# Patient Record
Sex: Female | Born: 1950
Health system: Southern US, Community
[De-identification: ages and names within clinical notes are randomized; demographics above are authoritative.]

## PROBLEM LIST (undated history)

## (undated) DIAGNOSIS — M1990 Unspecified osteoarthritis, unspecified site: Secondary | ICD-10-CM

## (undated) DIAGNOSIS — E669 Obesity, unspecified: Secondary | ICD-10-CM

## (undated) DIAGNOSIS — E079 Disorder of thyroid, unspecified: Secondary | ICD-10-CM

## (undated) DIAGNOSIS — F32A Depression, unspecified: Secondary | ICD-10-CM

## (undated) DIAGNOSIS — M545 Low back pain, unspecified: Secondary | ICD-10-CM

## (undated) DIAGNOSIS — E785 Hyperlipidemia, unspecified: Secondary | ICD-10-CM

## (undated) DIAGNOSIS — I10 Essential (primary) hypertension: Secondary | ICD-10-CM

## (undated) DIAGNOSIS — M5410 Radiculopathy, site unspecified: Secondary | ICD-10-CM

## (undated) DIAGNOSIS — E039 Hypothyroidism, unspecified: Secondary | ICD-10-CM

## (undated) DIAGNOSIS — F329 Major depressive disorder, single episode, unspecified: Secondary | ICD-10-CM

## (undated) DIAGNOSIS — E119 Type 2 diabetes mellitus without complications: Secondary | ICD-10-CM

## (undated) DIAGNOSIS — E876 Hypokalemia: Secondary | ICD-10-CM

## (undated) HISTORY — PX: TUBAL LIGATION: SHX77

## (undated) HISTORY — DX: Essential (primary) hypertension: I10

## (undated) HISTORY — DX: Major depressive disorder, single episode, unspecified: F32.9

## (undated) HISTORY — PX: THYROID LOBECTOMY: SHX420

## (undated) HISTORY — DX: Hyperlipidemia, unspecified: E78.5

## (undated) HISTORY — DX: Hypokalemia: E87.6

## (undated) HISTORY — DX: Low back pain, unspecified: M54.50

## (undated) HISTORY — DX: Low back pain: M54.5

## (undated) HISTORY — DX: Radiculopathy, site unspecified: M54.10

## (undated) HISTORY — DX: Depression, unspecified: F32.A

## (undated) HISTORY — DX: Disorder of thyroid, unspecified: E07.9

## (undated) HISTORY — DX: Obesity, unspecified: E66.9

---

## 1997-10-15 ENCOUNTER — Encounter: Admission: RE | Admit: 1997-10-15 | Discharge: 1997-10-15 | Payer: Self-pay | Admitting: Family Medicine

## 1997-11-02 ENCOUNTER — Encounter: Admission: RE | Admit: 1997-11-02 | Discharge: 1997-11-02 | Payer: Self-pay | Admitting: Family Medicine

## 1997-11-08 ENCOUNTER — Encounter: Admission: RE | Admit: 1997-11-08 | Discharge: 1997-11-08 | Payer: Self-pay | Admitting: Family Medicine

## 1998-10-08 ENCOUNTER — Encounter: Admission: RE | Admit: 1998-10-08 | Discharge: 1998-10-08 | Payer: Self-pay | Admitting: Sports Medicine

## 1998-11-07 ENCOUNTER — Encounter: Admission: RE | Admit: 1998-11-07 | Discharge: 1998-11-07 | Payer: Self-pay | Admitting: Family Medicine

## 1998-11-21 ENCOUNTER — Encounter: Admission: RE | Admit: 1998-11-21 | Discharge: 1998-11-21 | Payer: Self-pay | Admitting: Family Medicine

## 1999-03-05 ENCOUNTER — Encounter: Admission: RE | Admit: 1999-03-05 | Discharge: 1999-03-05 | Payer: Self-pay | Admitting: Family Medicine

## 1999-09-04 ENCOUNTER — Ambulatory Visit (HOSPITAL_BASED_OUTPATIENT_CLINIC_OR_DEPARTMENT_OTHER): Admission: RE | Admit: 1999-09-04 | Discharge: 1999-09-04 | Payer: Self-pay | Admitting: Orthopedic Surgery

## 2000-03-02 ENCOUNTER — Encounter: Admission: RE | Admit: 2000-03-02 | Discharge: 2000-03-02 | Payer: Self-pay | Admitting: Family Medicine

## 2000-03-09 ENCOUNTER — Encounter: Admission: RE | Admit: 2000-03-09 | Discharge: 2000-03-09 | Payer: Self-pay | Admitting: *Deleted

## 2000-03-09 ENCOUNTER — Encounter: Payer: Self-pay | Admitting: *Deleted

## 2000-06-15 HISTORY — PX: SHOULDER ARTHROSCOPY: SHX128

## 2001-04-04 ENCOUNTER — Encounter: Admission: RE | Admit: 2001-04-04 | Discharge: 2001-04-04 | Payer: Self-pay | Admitting: Family Medicine

## 2001-04-05 ENCOUNTER — Encounter: Payer: Self-pay | Admitting: *Deleted

## 2001-04-05 ENCOUNTER — Encounter: Admission: RE | Admit: 2001-04-05 | Discharge: 2001-04-05 | Payer: Self-pay | Admitting: *Deleted

## 2001-04-11 ENCOUNTER — Encounter: Admission: RE | Admit: 2001-04-11 | Discharge: 2001-04-11 | Payer: Self-pay | Admitting: Family Medicine

## 2001-07-20 ENCOUNTER — Emergency Department (HOSPITAL_COMMUNITY): Admission: EM | Admit: 2001-07-20 | Discharge: 2001-07-20 | Payer: Self-pay

## 2001-11-18 ENCOUNTER — Encounter: Admission: RE | Admit: 2001-11-18 | Discharge: 2001-11-18 | Payer: Self-pay | Admitting: Family Medicine

## 2001-12-02 ENCOUNTER — Encounter: Admission: RE | Admit: 2001-12-02 | Discharge: 2001-12-02 | Payer: Self-pay | Admitting: Family Medicine

## 2001-12-12 ENCOUNTER — Ambulatory Visit (HOSPITAL_COMMUNITY): Admission: RE | Admit: 2001-12-12 | Discharge: 2001-12-12 | Payer: Self-pay | Admitting: *Deleted

## 2001-12-28 ENCOUNTER — Encounter: Admission: RE | Admit: 2001-12-28 | Discharge: 2001-12-28 | Payer: Self-pay | Admitting: Family Medicine

## 2002-01-09 ENCOUNTER — Encounter: Admission: RE | Admit: 2002-01-09 | Discharge: 2002-01-09 | Payer: Self-pay | Admitting: Podiatry

## 2002-01-24 ENCOUNTER — Encounter: Admission: RE | Admit: 2002-01-24 | Discharge: 2002-01-24 | Payer: Self-pay | Admitting: Family Medicine

## 2002-02-06 ENCOUNTER — Encounter: Admission: RE | Admit: 2002-02-06 | Discharge: 2002-02-06 | Payer: Self-pay | Admitting: Family Medicine

## 2002-02-20 ENCOUNTER — Encounter: Admission: RE | Admit: 2002-02-20 | Discharge: 2002-02-20 | Payer: Self-pay | Admitting: Family Medicine

## 2002-07-18 ENCOUNTER — Encounter: Admission: RE | Admit: 2002-07-18 | Discharge: 2002-07-18 | Payer: Self-pay | Admitting: Family Medicine

## 2002-08-31 ENCOUNTER — Encounter: Admission: RE | Admit: 2002-08-31 | Discharge: 2002-08-31 | Payer: Self-pay | Admitting: Sports Medicine

## 2002-10-04 ENCOUNTER — Encounter: Admission: RE | Admit: 2002-10-04 | Discharge: 2002-10-04 | Payer: Self-pay | Admitting: Family Medicine

## 2002-10-09 ENCOUNTER — Encounter: Payer: Self-pay | Admitting: Sports Medicine

## 2002-10-09 ENCOUNTER — Encounter: Admission: RE | Admit: 2002-10-09 | Discharge: 2002-10-09 | Payer: Self-pay | Admitting: Sports Medicine

## 2002-10-13 ENCOUNTER — Encounter: Admission: RE | Admit: 2002-10-13 | Discharge: 2002-10-13 | Payer: Self-pay | Admitting: Family Medicine

## 2002-11-10 ENCOUNTER — Encounter: Admission: RE | Admit: 2002-11-10 | Discharge: 2002-11-10 | Payer: Self-pay | Admitting: Sports Medicine

## 2002-11-28 ENCOUNTER — Encounter: Admission: RE | Admit: 2002-11-28 | Discharge: 2002-11-28 | Payer: Self-pay | Admitting: Sports Medicine

## 2002-12-04 ENCOUNTER — Encounter (INDEPENDENT_AMBULATORY_CARE_PROVIDER_SITE_OTHER): Payer: Self-pay | Admitting: Specialist

## 2002-12-04 ENCOUNTER — Observation Stay (HOSPITAL_COMMUNITY): Admission: RE | Admit: 2002-12-04 | Discharge: 2002-12-05 | Payer: Self-pay | Admitting: General Surgery

## 2003-07-17 ENCOUNTER — Encounter (INDEPENDENT_AMBULATORY_CARE_PROVIDER_SITE_OTHER): Payer: Self-pay | Admitting: *Deleted

## 2003-07-31 ENCOUNTER — Encounter: Admission: RE | Admit: 2003-07-31 | Discharge: 2003-07-31 | Payer: Self-pay | Admitting: Family Medicine

## 2003-08-09 ENCOUNTER — Encounter: Admission: RE | Admit: 2003-08-09 | Discharge: 2003-08-09 | Payer: Self-pay | Admitting: Sports Medicine

## 2003-08-23 ENCOUNTER — Encounter: Admission: RE | Admit: 2003-08-23 | Discharge: 2003-08-23 | Payer: Self-pay | Admitting: Sports Medicine

## 2003-09-13 ENCOUNTER — Encounter: Admission: RE | Admit: 2003-09-13 | Discharge: 2003-09-13 | Payer: Self-pay | Admitting: Sports Medicine

## 2003-09-26 ENCOUNTER — Encounter: Admission: RE | Admit: 2003-09-26 | Discharge: 2003-09-26 | Payer: Self-pay | Admitting: Family Medicine

## 2003-10-26 ENCOUNTER — Encounter: Admission: RE | Admit: 2003-10-26 | Discharge: 2003-10-26 | Payer: Self-pay | Admitting: Family Medicine

## 2003-12-03 ENCOUNTER — Encounter: Admission: RE | Admit: 2003-12-03 | Discharge: 2003-12-03 | Payer: Self-pay | Admitting: Family Medicine

## 2004-07-21 ENCOUNTER — Ambulatory Visit: Payer: Self-pay | Admitting: Family Medicine

## 2004-07-28 ENCOUNTER — Ambulatory Visit: Payer: Self-pay | Admitting: Sports Medicine

## 2004-08-19 ENCOUNTER — Ambulatory Visit: Payer: Self-pay | Admitting: Family Medicine

## 2004-09-05 ENCOUNTER — Encounter: Admission: RE | Admit: 2004-09-05 | Discharge: 2004-09-05 | Payer: Self-pay | Admitting: Sports Medicine

## 2005-01-08 ENCOUNTER — Ambulatory Visit: Payer: Self-pay | Admitting: Family Medicine

## 2005-05-21 ENCOUNTER — Ambulatory Visit: Payer: Self-pay | Admitting: Family Medicine

## 2005-10-20 ENCOUNTER — Encounter: Admission: RE | Admit: 2005-10-20 | Discharge: 2005-10-20 | Payer: Self-pay | Admitting: Family Medicine

## 2005-12-31 ENCOUNTER — Ambulatory Visit: Payer: Self-pay | Admitting: Family Medicine

## 2005-12-31 ENCOUNTER — Encounter (INDEPENDENT_AMBULATORY_CARE_PROVIDER_SITE_OTHER): Payer: Self-pay | Admitting: *Deleted

## 2006-01-13 ENCOUNTER — Ambulatory Visit: Payer: Self-pay | Admitting: Family Medicine

## 2006-03-30 ENCOUNTER — Ambulatory Visit: Payer: Self-pay | Admitting: Family Medicine

## 2006-04-14 ENCOUNTER — Encounter: Admission: RE | Admit: 2006-04-14 | Discharge: 2006-04-14 | Payer: Self-pay | Admitting: Family Medicine

## 2006-06-30 ENCOUNTER — Ambulatory Visit: Payer: Self-pay | Admitting: Obstetrics & Gynecology

## 2006-06-30 ENCOUNTER — Other Ambulatory Visit: Admission: RE | Admit: 2006-06-30 | Discharge: 2006-06-30 | Payer: Self-pay | Admitting: Obstetrics and Gynecology

## 2006-07-01 ENCOUNTER — Encounter (INDEPENDENT_AMBULATORY_CARE_PROVIDER_SITE_OTHER): Payer: Self-pay | Admitting: *Deleted

## 2006-07-15 ENCOUNTER — Ambulatory Visit: Payer: Self-pay | Admitting: Obstetrics & Gynecology

## 2006-08-12 DIAGNOSIS — E669 Obesity, unspecified: Secondary | ICD-10-CM | POA: Insufficient documentation

## 2006-08-12 DIAGNOSIS — K5730 Diverticulosis of large intestine without perforation or abscess without bleeding: Secondary | ICD-10-CM | POA: Insufficient documentation

## 2006-08-12 DIAGNOSIS — F339 Major depressive disorder, recurrent, unspecified: Secondary | ICD-10-CM | POA: Insufficient documentation

## 2006-08-12 DIAGNOSIS — E876 Hypokalemia: Secondary | ICD-10-CM | POA: Insufficient documentation

## 2006-08-12 DIAGNOSIS — I10 Essential (primary) hypertension: Secondary | ICD-10-CM | POA: Insufficient documentation

## 2006-08-12 DIAGNOSIS — E039 Hypothyroidism, unspecified: Secondary | ICD-10-CM | POA: Insufficient documentation

## 2006-08-13 ENCOUNTER — Encounter (INDEPENDENT_AMBULATORY_CARE_PROVIDER_SITE_OTHER): Payer: Self-pay | Admitting: *Deleted

## 2006-09-08 ENCOUNTER — Telehealth (INDEPENDENT_AMBULATORY_CARE_PROVIDER_SITE_OTHER): Payer: Self-pay | Admitting: *Deleted

## 2006-12-09 ENCOUNTER — Ambulatory Visit: Payer: Self-pay | Admitting: Family Medicine

## 2006-12-09 ENCOUNTER — Encounter (INDEPENDENT_AMBULATORY_CARE_PROVIDER_SITE_OTHER): Payer: Self-pay | Admitting: Family Medicine

## 2006-12-09 LAB — CONVERTED CEMR LAB
Cholesterol, target level: 200 mg/dL
Free T4: 1.1 ng/dL (ref 0.89–1.80)
HDL goal, serum: 40 mg/dL
LDL Goal: 130 mg/dL

## 2006-12-10 ENCOUNTER — Encounter (INDEPENDENT_AMBULATORY_CARE_PROVIDER_SITE_OTHER): Payer: Self-pay | Admitting: Family Medicine

## 2007-01-11 ENCOUNTER — Ambulatory Visit: Payer: Self-pay | Admitting: Family Medicine

## 2007-01-11 ENCOUNTER — Encounter (INDEPENDENT_AMBULATORY_CARE_PROVIDER_SITE_OTHER): Payer: Self-pay | Admitting: Family Medicine

## 2007-01-11 ENCOUNTER — Encounter: Payer: Self-pay | Admitting: Family Medicine

## 2007-01-11 LAB — CONVERTED CEMR LAB
BUN: 19 mg/dL (ref 6–23)
CO2: 30 meq/L (ref 19–32)
Calcium: 9.3 mg/dL (ref 8.4–10.5)
Chloride: 100 meq/L (ref 96–112)
Creatinine, Ser: 0.92 mg/dL (ref 0.40–1.20)

## 2007-01-14 ENCOUNTER — Encounter (INDEPENDENT_AMBULATORY_CARE_PROVIDER_SITE_OTHER): Payer: Self-pay | Admitting: Family Medicine

## 2007-08-30 ENCOUNTER — Telehealth: Payer: Self-pay | Admitting: *Deleted

## 2007-08-31 ENCOUNTER — Encounter (INDEPENDENT_AMBULATORY_CARE_PROVIDER_SITE_OTHER): Payer: Self-pay | Admitting: Family Medicine

## 2007-08-31 ENCOUNTER — Ambulatory Visit: Payer: Self-pay | Admitting: Family Medicine

## 2007-09-14 ENCOUNTER — Ambulatory Visit: Payer: Self-pay | Admitting: Family Medicine

## 2007-09-15 ENCOUNTER — Encounter: Admission: RE | Admit: 2007-09-15 | Discharge: 2007-09-15 | Payer: Self-pay | Admitting: Family Medicine

## 2007-12-22 ENCOUNTER — Telehealth: Payer: Self-pay | Admitting: *Deleted

## 2008-01-11 ENCOUNTER — Ambulatory Visit: Payer: Self-pay | Admitting: Family Medicine

## 2008-01-11 ENCOUNTER — Encounter: Payer: Self-pay | Admitting: Family Medicine

## 2008-01-12 LAB — CONVERTED CEMR LAB
BUN: 24 mg/dL — ABNORMAL HIGH (ref 6–23)
Cholesterol: 163 mg/dL (ref 0–200)
Creatinine, Ser: 0.82 mg/dL (ref 0.40–1.20)
Potassium: 4.1 meq/L (ref 3.5–5.3)
Triglycerides: 153 mg/dL — ABNORMAL HIGH (ref <150)

## 2008-01-22 ENCOUNTER — Encounter: Payer: Self-pay | Admitting: Family Medicine

## 2008-01-31 ENCOUNTER — Encounter: Admission: RE | Admit: 2008-01-31 | Discharge: 2008-01-31 | Payer: Self-pay | Admitting: Family Medicine

## 2008-02-01 ENCOUNTER — Telehealth: Payer: Self-pay | Admitting: Family Medicine

## 2008-02-02 ENCOUNTER — Telehealth: Payer: Self-pay | Admitting: *Deleted

## 2008-02-06 ENCOUNTER — Ambulatory Visit: Payer: Self-pay | Admitting: Sports Medicine

## 2008-02-06 ENCOUNTER — Encounter (INDEPENDENT_AMBULATORY_CARE_PROVIDER_SITE_OTHER): Payer: Self-pay | Admitting: Family Medicine

## 2008-02-06 ENCOUNTER — Encounter: Payer: Self-pay | Admitting: Family Medicine

## 2008-02-06 LAB — CONVERTED CEMR LAB: LDL Goal: 160 mg/dL

## 2008-02-08 ENCOUNTER — Telehealth: Payer: Self-pay | Admitting: *Deleted

## 2008-02-08 LAB — CONVERTED CEMR LAB
Chlamydia, DNA Probe: POSITIVE — AB
GC Probe Amp, Genital: NEGATIVE

## 2008-04-26 ENCOUNTER — Telehealth: Payer: Self-pay | Admitting: *Deleted

## 2008-06-27 ENCOUNTER — Telehealth: Payer: Self-pay | Admitting: *Deleted

## 2008-07-27 ENCOUNTER — Ambulatory Visit: Payer: Self-pay | Admitting: Family Medicine

## 2008-07-27 ENCOUNTER — Encounter: Payer: Self-pay | Admitting: Family Medicine

## 2008-07-27 LAB — CONVERTED CEMR LAB
HCT: 36 % (ref 36.0–46.0)
MCHC: 34.4 g/dL (ref 30.0–36.0)
MCV: 88.5 fL (ref 78.0–100.0)
Platelets: 219 10*3/uL (ref 150–400)
RDW: 12.8 % (ref 11.5–15.5)

## 2008-07-28 ENCOUNTER — Encounter: Payer: Self-pay | Admitting: Family Medicine

## 2008-08-16 ENCOUNTER — Telehealth: Payer: Self-pay | Admitting: *Deleted

## 2008-08-22 ENCOUNTER — Ambulatory Visit: Payer: Self-pay | Admitting: Family Medicine

## 2008-08-22 ENCOUNTER — Encounter: Admission: RE | Admit: 2008-08-22 | Discharge: 2008-08-22 | Payer: Self-pay | Admitting: Family Medicine

## 2008-08-22 ENCOUNTER — Telehealth (INDEPENDENT_AMBULATORY_CARE_PROVIDER_SITE_OTHER): Payer: Self-pay | Admitting: *Deleted

## 2008-08-22 ENCOUNTER — Encounter: Payer: Self-pay | Admitting: Family Medicine

## 2008-08-22 LAB — CONVERTED CEMR LAB
Calcium: 9.2 mg/dL (ref 8.4–10.5)
Creatinine, Ser: 0.86 mg/dL (ref 0.40–1.20)
Sodium: 141 meq/L (ref 135–145)

## 2008-08-23 ENCOUNTER — Encounter: Payer: Self-pay | Admitting: *Deleted

## 2008-08-24 ENCOUNTER — Encounter: Admission: RE | Admit: 2008-08-24 | Discharge: 2008-08-24 | Payer: Self-pay | Admitting: Family Medicine

## 2008-09-26 ENCOUNTER — Ambulatory Visit: Payer: Self-pay | Admitting: Family Medicine

## 2008-09-26 LAB — CONVERTED CEMR LAB: Hgb A1c MFr Bld: 6.6 %

## 2008-09-28 ENCOUNTER — Encounter: Admission: RE | Admit: 2008-09-28 | Discharge: 2008-09-28 | Payer: Self-pay | Admitting: Family Medicine

## 2008-09-28 ENCOUNTER — Telehealth: Payer: Self-pay | Admitting: Family Medicine

## 2008-10-29 ENCOUNTER — Ambulatory Visit: Payer: Self-pay | Admitting: Family Medicine

## 2008-10-29 DIAGNOSIS — E119 Type 2 diabetes mellitus without complications: Secondary | ICD-10-CM | POA: Insufficient documentation

## 2008-10-29 DIAGNOSIS — M1610 Unilateral primary osteoarthritis, unspecified hip: Secondary | ICD-10-CM | POA: Insufficient documentation

## 2008-10-29 DIAGNOSIS — M47819 Spondylosis without myelopathy or radiculopathy, site unspecified: Secondary | ICD-10-CM | POA: Insufficient documentation

## 2008-10-29 DIAGNOSIS — M169 Osteoarthritis of hip, unspecified: Secondary | ICD-10-CM

## 2008-10-30 ENCOUNTER — Telehealth: Payer: Self-pay | Admitting: Family Medicine

## 2008-11-20 ENCOUNTER — Telehealth: Payer: Self-pay | Admitting: Family Medicine

## 2009-01-31 ENCOUNTER — Encounter: Admission: RE | Admit: 2009-01-31 | Discharge: 2009-01-31 | Payer: Self-pay | Admitting: Family Medicine

## 2009-02-15 ENCOUNTER — Telehealth: Payer: Self-pay | Admitting: Family Medicine

## 2009-02-15 ENCOUNTER — Encounter: Payer: Self-pay | Admitting: Family Medicine

## 2009-02-15 ENCOUNTER — Encounter: Admission: RE | Admit: 2009-02-15 | Discharge: 2009-02-15 | Payer: Self-pay | Admitting: Family Medicine

## 2009-04-15 ENCOUNTER — Ambulatory Visit: Payer: Self-pay | Admitting: Family Medicine

## 2009-04-15 ENCOUNTER — Encounter: Payer: Self-pay | Admitting: Family Medicine

## 2009-04-15 LAB — CONVERTED CEMR LAB
Calcium: 8.7 mg/dL (ref 8.4–10.5)
Glucose, Bld: 90 mg/dL (ref 70–99)
Hgb A1c MFr Bld: 6.3 %
Potassium: 3.6 meq/L (ref 3.5–5.3)
Sodium: 142 meq/L (ref 135–145)

## 2009-04-16 ENCOUNTER — Ambulatory Visit: Payer: Self-pay | Admitting: Cardiology

## 2009-04-16 ENCOUNTER — Encounter: Payer: Self-pay | Admitting: Family Medicine

## 2009-04-16 DIAGNOSIS — E785 Hyperlipidemia, unspecified: Secondary | ICD-10-CM | POA: Insufficient documentation

## 2009-04-29 ENCOUNTER — Telehealth (INDEPENDENT_AMBULATORY_CARE_PROVIDER_SITE_OTHER): Payer: Self-pay

## 2009-04-30 ENCOUNTER — Ambulatory Visit (HOSPITAL_COMMUNITY): Admission: RE | Admit: 2009-04-30 | Discharge: 2009-04-30 | Payer: Self-pay | Admitting: Cardiology

## 2009-04-30 ENCOUNTER — Ambulatory Visit: Payer: Self-pay

## 2009-04-30 ENCOUNTER — Encounter: Payer: Self-pay | Admitting: Cardiology

## 2009-04-30 ENCOUNTER — Ambulatory Visit: Payer: Self-pay | Admitting: Cardiology

## 2009-04-30 ENCOUNTER — Encounter (HOSPITAL_COMMUNITY): Admission: RE | Admit: 2009-04-30 | Discharge: 2009-06-12 | Payer: Self-pay | Admitting: Cardiology

## 2009-05-01 LAB — CONVERTED CEMR LAB
ALT: 17 units/L (ref 0–35)
Albumin: 3.6 g/dL (ref 3.5–5.2)
Bilirubin, Direct: 0.1 mg/dL (ref 0.0–0.3)
Cholesterol: 186 mg/dL (ref 0–200)
Total Protein: 7.5 g/dL (ref 6.0–8.3)
Triglycerides: 147 mg/dL (ref 0.0–149.0)
VLDL: 29.4 mg/dL (ref 0.0–40.0)

## 2009-05-03 ENCOUNTER — Ambulatory Visit: Payer: Self-pay | Admitting: Cardiology

## 2009-05-13 ENCOUNTER — Ambulatory Visit: Payer: Self-pay | Admitting: Cardiology

## 2009-05-14 LAB — CONVERTED CEMR LAB
BUN: 20 mg/dL (ref 6–23)
Creatinine, Ser: 1.2 mg/dL (ref 0.4–1.2)
Eosinophils Relative: 1.9 % (ref 0.0–5.0)
GFR calc non Af Amer: 59.09 mL/min (ref >60)
HCT: 35 % — ABNORMAL LOW (ref 36.0–46.0)
INR: 0.9 (ref 0.8–1.0)
Monocytes Relative: 7.9 % (ref 3.0–12.0)
Neutrophils Relative %: 42.7 % — ABNORMAL LOW (ref 43.0–77.0)
Platelets: 257 10*3/uL (ref 150.0–400.0)
Potassium: 3.2 meq/L — ABNORMAL LOW (ref 3.5–5.1)
Prothrombin Time: 9.8 s (ref 9.1–11.7)
RBC: 3.9 M/uL (ref 3.87–5.11)
WBC: 3.9 10*3/uL — ABNORMAL LOW (ref 4.5–10.5)

## 2009-05-16 ENCOUNTER — Inpatient Hospital Stay (HOSPITAL_BASED_OUTPATIENT_CLINIC_OR_DEPARTMENT_OTHER): Admission: RE | Admit: 2009-05-16 | Discharge: 2009-05-16 | Payer: Self-pay | Admitting: Cardiology

## 2009-05-16 ENCOUNTER — Ambulatory Visit: Payer: Self-pay | Admitting: Cardiology

## 2009-05-17 ENCOUNTER — Telehealth: Payer: Self-pay | Admitting: Cardiology

## 2009-05-20 ENCOUNTER — Telehealth: Payer: Self-pay | Admitting: Cardiology

## 2009-05-21 ENCOUNTER — Encounter: Payer: Self-pay | Admitting: Cardiology

## 2009-05-24 ENCOUNTER — Ambulatory Visit: Payer: Self-pay | Admitting: Cardiology

## 2009-05-31 ENCOUNTER — Ambulatory Visit: Payer: Self-pay | Admitting: Cardiology

## 2009-09-06 ENCOUNTER — Telehealth: Payer: Self-pay | Admitting: *Deleted

## 2009-09-06 ENCOUNTER — Encounter: Admission: RE | Admit: 2009-09-06 | Discharge: 2009-09-06 | Payer: Self-pay | Admitting: Family Medicine

## 2009-09-19 ENCOUNTER — Encounter: Payer: Self-pay | Admitting: Family Medicine

## 2009-09-19 ENCOUNTER — Ambulatory Visit: Payer: Self-pay | Admitting: Family Medicine

## 2009-09-19 DIAGNOSIS — R5383 Other fatigue: Secondary | ICD-10-CM | POA: Insufficient documentation

## 2009-09-19 DIAGNOSIS — R5381 Other malaise: Secondary | ICD-10-CM | POA: Insufficient documentation

## 2009-09-19 LAB — CONVERTED CEMR LAB
Potassium: 3.8 meq/L (ref 3.5–5.3)
Sodium: 143 meq/L (ref 135–145)

## 2009-09-20 ENCOUNTER — Encounter: Payer: Self-pay | Admitting: Family Medicine

## 2009-09-26 ENCOUNTER — Encounter: Payer: Self-pay | Admitting: Family Medicine

## 2009-09-26 ENCOUNTER — Ambulatory Visit: Payer: Self-pay | Admitting: Family Medicine

## 2009-09-26 DIAGNOSIS — R21 Rash and other nonspecific skin eruption: Secondary | ICD-10-CM | POA: Insufficient documentation

## 2009-09-26 DIAGNOSIS — M549 Dorsalgia, unspecified: Secondary | ICD-10-CM | POA: Insufficient documentation

## 2009-09-27 ENCOUNTER — Encounter (INDEPENDENT_AMBULATORY_CARE_PROVIDER_SITE_OTHER): Payer: Self-pay | Admitting: *Deleted

## 2009-09-27 LAB — CONVERTED CEMR LAB
HCT: 39.5 % (ref 36.0–46.0)
Hemoglobin: 12.5 g/dL (ref 12.0–15.0)
MCV: 90.4 fL (ref 78.0–100.0)
RBC: 4.37 M/uL (ref 3.87–5.11)
WBC: 3.8 10*3/uL — ABNORMAL LOW (ref 4.0–10.5)

## 2009-10-08 ENCOUNTER — Telehealth: Payer: Self-pay | Admitting: *Deleted

## 2009-10-09 ENCOUNTER — Encounter: Payer: Self-pay | Admitting: Family Medicine

## 2009-10-17 ENCOUNTER — Ambulatory Visit: Payer: Self-pay | Admitting: Family Medicine

## 2009-10-24 ENCOUNTER — Encounter: Payer: Self-pay | Admitting: *Deleted

## 2009-10-29 ENCOUNTER — Encounter: Admission: RE | Admit: 2009-10-29 | Discharge: 2009-11-12 | Payer: Self-pay | Admitting: Family Medicine

## 2009-11-12 ENCOUNTER — Encounter: Payer: Self-pay | Admitting: Family Medicine

## 2010-03-06 ENCOUNTER — Encounter: Admission: RE | Admit: 2010-03-06 | Discharge: 2010-03-06 | Payer: Self-pay | Admitting: Family Medicine

## 2010-03-07 ENCOUNTER — Encounter: Payer: Self-pay | Admitting: Family Medicine

## 2010-03-19 ENCOUNTER — Telehealth: Payer: Self-pay | Admitting: *Deleted

## 2010-06-12 ENCOUNTER — Telehealth: Payer: Self-pay | Admitting: Family Medicine

## 2010-07-01 ENCOUNTER — Ambulatory Visit: Admission: RE | Admit: 2010-07-01 | Discharge: 2010-07-01 | Payer: Self-pay | Source: Home / Self Care

## 2010-07-06 ENCOUNTER — Encounter: Payer: Self-pay | Admitting: Family Medicine

## 2010-07-07 ENCOUNTER — Encounter: Payer: Self-pay | Admitting: Family Medicine

## 2010-07-15 NOTE — Progress Notes (Signed)
Summary: refill  Phone Note Refill Request Call back at Home Phone [phone] Call back at [phone] Message from:  Patient  Refills Requested: Medication #1:  SYNTHROID 50 MCG TABS Take 1 tablet by mouth once a day [facility] - [phone]  Initial call taken by: De Nurse,  March 19, 2010 4:40 PM  Follow-up for Phone Call       Follow-up by: Golden Circle RN,  March 19, 2010 4:44 PM    Prescriptions: SYNTHROID 50 MCG TABS (LEVOTHYROXINE SODIUM) Take 1 tablet by mouth once a day  #90 x 3   Entered by:   Golden Circle RN   Authorized by:   Lloyd Huger MD   Signed by:   Golden Circle RN on 03/19/2010   Method used:   Printed then faxed to ...       [facility] (mail-order)       [address]       [place], [place]  [address]       Ph: [phone]       Fax: [phone]   RxID:   [id number] SYNTHROID 50 MCG TABS (LEVOTHYROXINE SODIUM) Take 1 tablet by mouth once a day  #90 x 3   Entered by:   Golden Circle RN   Authorized by:   Lloyd Huger MD   Signed by:   Golden Circle RN on 03/19/2010   Method used:   Historical   RxID:   [id number]

## 2010-07-15 NOTE — Letter (Signed)
Summary: Generic Letter  [facility]  [address]   [place], [place] [address]   Phone: [phone]  Fax: [phone]    09/20/2009  ALIA PARSLEY [address] [place], [place]  [address]  Dear Ms. Beaumier,    I just wanted to let you know that your lab results were normal.  I will send in your prescription for the potassium pills.  Please call me if you have any questions or concerns.          Sincerely,   Asher Muir MD  Appended Document: Generic Letter mailed.

## 2010-07-15 NOTE — Letter (Signed)
Summary: Out of Work  [facility]  [address]   [place], [place] [address]   Phone: [phone]  Fax: [phone]    September 26, 2009   Employee:  Candice Warner    To Whom It May Concern:   For Medical reasons, please excuse the above named employee from work for the following dates:  Start:   September 30, 2009  End:   October 04, 2009  If you need additional information, please feel free to contact our office.         Sincerely,    Asher Muir MD

## 2010-07-15 NOTE — Letter (Signed)
Summary: Generic Letter  [facility]  [address]   [place], [place] [address]   Phone: [phone]  Fax: [phone]    10/09/2009  Candice Warner [address] [place], [place]  [address]  To whom it may concern,  [name] may return to work on May 2nd, 2011.  Please call me if you have any questions or concerns.              Sincerely,   Asher Muir MD

## 2010-07-15 NOTE — Letter (Signed)
Summary: Generic Letter  [facility]  [address]   [place], [place] [address]   Phone: [phone]  Fax: [phone]    03/07/2010  KATIANNA MCCLENNEY [address] [place], [place]  [address]  Dear Ms. Delia,  Your recent mammagram did not show any worrisome masses. It is recommended that you continue to get mammogram screening on a yearly basis, with the next being due in September, 2012.  Sincerely,   Lloyd Huger MD  Appended Document: Generic Letter mailed.  Appended Document: Generic Letter mailed.

## 2010-07-15 NOTE — Assessment & Plan Note (Signed)
Summary: pain in back/legs/eo   Vital Signs:  Patient profile:   60 year old female Weight:      211.3 pounds Temp:     98.2 degrees F oral Pulse rate:   71 / minute Pulse rhythm:   regular BP sitting:   147 / 81  (left arm) Cuff size:   regular  Vitals Entered By: Loralee Pacas CMA (September 19, 2009 4:02 PM)  Primary Care Provider:  Asher Muir MD   History of Present Illness: 1.  sleepy--sleepy all the time.  worried she will fall asleep while driving.  works 2 jobs.  gets up at 4 and go to sleep at mn.  at this time, is not able to quit either of these jobs.    2.  back pain--lower back.  worse with lots of walking and worse after sitting a long time.  hurts to bend down.  pain radiates to left leg.  no mri.  taking ibuprofen.  no weakness.  no b/ b incontinence and no saddle anesthia.  never tried physical therapy  3.  diabetes--due for A1C.  diet controlled thus far.  no symptoms  4.  chest pain--seen by cards.  stress suggesteive of ischemia, but but cath normal; so likely the abnromal stress test was due to breast shadow.    5.  hypokalemia--placed on KCl by cards.  has not had a bmet in several months  Allergies: No Known Drug Allergies  Review of Systems  The patient denies anorexia, weight loss, dyspnea on exertion, and prolonged cough.    Physical Exam  General:  Well-groomed, overweight, appears tired, but NAD Additional Exam:  vital signs reviewed    Detailed Back/Spine Exam  Gait:    normal  Palpation:    mild ttp over paraspinous muscles.  no ttp over si joint or sciatic notch  Lumbosacral Exam:  Range of Motion:    Forward Flexion:   90 degrees    Hyperextension:   20 degrees    Right Lateral Bend:   25 degrees    Left Lateral Bend:   25 degrees Sitting Straight Leg Raise:    Right:  negative    Left:  negative Sciatic Notch:    There is no sciatic notch tenderness. Toe Walking:    Right:  normal    Left:  normal Heel Walking:  Right:  normal    Left:  normal Fabere Test:    Right:  negative    Left:  negative     normal strength in major muscle groups fo lower extremities   Impression & Recommendations:  Problem # 1:  CHEST PAIN (ICD-786.50) Assessment Improved  no CAD  Orders: [facility]- Est  Level 4 (89381)  Problem # 2:  DIABETES MELLITUS, TYPE II, WITHOUT COMPLICATIONS (ICD-250.00) check A1C today Her updated medication list for this problem includes:    Lisinopril-hydrochlorothiazide 20-25 Mg Tabs (Lisinopril-hydrochlorothiazide) ..... One tablet daily    Aspirin 81 Mg Tbec (Aspirin) ..... One tablet daily  Orders: A1C-[facility] (01751) [facility]- Est  Level 4 (02585)  Problem # 3:  HYPOKALEMIA (ICD-276.8) Assessment: Unchanged  check bmet today  Orders: Basic Met-[facility] (0011001100) [facility]- Est  Level 4 ([address])  Problem # 4:  BACK PAIN (ICD-724.5) Assessment: Deteriorated no red flags.  symptoms are radicular.  would like to do physcial therapy, but doubt she has time.  for now, stop ibuprofen.  start skelaxin and tramadol.  The following medications were removed from the medication list:  Ibuprofen 800 Mg Tabs (Ibuprofen) .Marland Kitchen... As needed Her updated medication list for this problem includes:    Tylenol Extra Strength 500 Mg Tabs (Acetaminophen) .Marland Kitchen... As needed    Aspirin 81 Mg Tbec (Aspirin) ..... One tablet daily    Skelaxin 800 Mg Tabs (Metaxalone) .Marland Kitchen... 1 tab by mouth three times a day as needed muscle spasm; dispense generic    Tramadol Hcl 50 Mg Tabs (Tramadol hcl) .Marland Kitchen... 1 tab by mouth q 6 hours as needed pain  Problem # 5:  FATIGUE (ICD-780.79) Assessment: New think this is from sleeping only 4 hours.  medical excuse for her evening job for next week.  I am worried that she will have an mva or other accident if she continues to sleep this little  Problem # 6:  HYPOTHYROIDISM, UNSPECIFIED (ICD-244.9) Assessment: Unchanged check tsh next visit Her updated medication list for this problem  includes:    Synthroid 50 Mcg Tabs (Levothyroxine sodium) .Marland Kitchen... Take 1 tablet by mouth once a day  Complete Medication List: 1)  Tylenol Extra Strength 500 Mg Tabs (Acetaminophen) .... As needed 2)  Lisinopril-hydrochlorothiazide 20-25 Mg Tabs (Lisinopril-hydrochlorothiazide) .... One tablet daily 3)  Synthroid 50 Mcg Tabs (Levothyroxine sodium) .... Take 1 tablet by mouth once a day 4)  Sertraline Hcl 100 Mg Tabs (Sertraline hcl) .... Take 1 tab by mouth daily 5)  Hydroxyzine Hcl 25 Mg Tabs (Hydroxyzine hcl) .... Four times a day as needed 6)  Aspirin 81 Mg Tbec (Aspirin) .... One tablet daily 7)  Potassium Chloride Crys Cr 20 Meq Cr-tabs (Potassium chloride crys cr) .... Take one tablet by mouth twice a day 8)  Skelaxin 800 Mg Tabs (Metaxalone) .Marland Kitchen.. 1 tab by mouth three times a day as needed muscle spasm; dispense generic 9)  Tramadol Hcl 50 Mg Tabs (Tramadol hcl) .Marland Kitchen.. 1 tab by mouth q 6 hours as needed pain  Patient Instructions: 1)  It was nice to see you today. 2)  You definitely need more sleep. 3)  Stay home for the next week. 4)  For your back, take the tramadol for pain and skelaxin for muscle spasm 5)  Please schedule a follow-up appointment next week and come up with a more long term plan. 6)    Prescriptions: TRAMADOL HCL 50 MG TABS (TRAMADOL HCL) 1 tab by mouth q 6 hours as needed pain  #90 x 1   Entered and Authorized by:   Asher Muir MD   Signed by:   Asher Muir MD on 09/19/2009   Method used:   Electronically to        [facility] [address] (retail)       [phone]       [place], [place]  [address]       Ph: [phone]       Fax: [phone]   RxID:   [id number] SKELAXIN 800 MG TABS (METAXALONE) 1 tab by mouth three times a day as needed muscle spasm; dispense generic  #30 x 0   Entered and Authorized by:   Asher Muir MD   Signed by:   Asher Muir MD on 09/19/2009   Method used:   Electronically to         [facility] [address] (retail)       [phone]       [place], [place]  [address]  Ph: [phone]       Fax: [phone]   RxID:   [id number]   Laboratory Results   Blood Tests   Date/Time Received: September 19, 2009 4:40 PM  Date/Time Reported: September 19, 2009 5:04 PM   HGBA1C: 6.3%   (Normal Range: Non-Diabetic - 3-6%   Control Diabetic - 6-8%)  Comments: ...........test performed by...........Marland KitchenTerese Door, CMA

## 2010-07-15 NOTE — Consult Note (Signed)
Summary: [facility] Discharge Summary  [facility] Discharge Summary   Imported By: Clydell Hakim 11/20/2009 15:38:57  _____________________________________________________________________  External Attachment:    Type:   Image     Comment:   External Document  Appended Document: [facility] Discharge Summary    Clinical Lists Changes  Problems: Assessed BACK PAIN, LUMBAR, WITH RADICULOPATHY as comment only - pt stopped showing up to physical therapy and, therefore, was discharged. Her updated medication list for this problem includes:    Tylenol Extra Strength 500 Mg Tabs (Acetaminophen) .Marland Kitchen... As needed    Aspirin 81 Mg Tbec (Aspirin) ..... One tablet daily    Skelaxin 800 Mg Tabs (Metaxalone) .Marland Kitchen... 1 tab by mouth three times a day as needed muscle spasm; dispense generic    Tramadol Hcl 50 Mg Tabs (Tramadol hcl) .Marland Kitchen... 1 tab by mouth q 6 hours as needed pain        Impression & Recommendations:  Problem # 1:  BACK PAIN, LUMBAR, WITH RADICULOPATHY (ICD-724.4) Assessment Comment Only pt stopped showing up to physical therapy and, therefore, was discharged. Her updated medication list for this problem includes:    Tylenol Extra Strength 500 Mg Tabs (Acetaminophen) .Marland Kitchen... As needed    Aspirin 81 Mg Tbec (Aspirin) ..... One tablet daily    Skelaxin 800 Mg Tabs (Metaxalone) .Marland Kitchen... 1 tab by mouth three times a day as needed muscle spasm; dispense generic    Tramadol Hcl 50 Mg Tabs (Tramadol hcl) .Marland Kitchen... 1 tab by mouth q 6 hours as needed pain  Complete Medication List: 1)  Tylenol Extra Strength 500 Mg Tabs (Acetaminophen) .... As needed 2)  Lisinopril-hydrochlorothiazide 20-25 Mg Tabs (Lisinopril-hydrochlorothiazide) .... One tablet daily 3)  Synthroid 50 Mcg Tabs (Levothyroxine sodium) .... Take 1 tablet by mouth once a day 4)  Sertraline Hcl 100 Mg Tabs (Sertraline hcl) .... Take 1 tab by mouth daily 5)  Hydroxyzine Hcl 25 Mg Tabs  (Hydroxyzine hcl) .... Four times a day as needed 6)  Aspirin 81 Mg Tbec (Aspirin) .... One tablet daily 7)  Potassium Chloride Crys Cr 20 Meq Cr-tabs (Potassium chloride crys cr) .... Take one tablet by mouth twice a day 8)  Skelaxin 800 Mg Tabs (Metaxalone) .Marland Kitchen.. 1 tab by mouth three times a day as needed muscle spasm; dispense generic 9)  Tramadol Hcl 50 Mg Tabs (Tramadol hcl) .Marland Kitchen.. 1 tab by mouth q 6 hours as needed pain 10)  Norvasc 2.5 Mg Tabs (Amlodipine besylate) .Marland Kitchen.. 1 tab by mouth daily for blood pressure; dispense generic 11)  Mupirocin 2 % Oint (Mupirocin) .... Apply to rash two times a day until cleared; dispense one tube

## 2010-07-15 NOTE — Miscellaneous (Signed)
Summary: 10/17/09 APPT  per Olegario Messier foster, pts appt on 10/17/09 needs to be moved up from 4:15 to 2:15, Dr. Lafonda Mosses is leaving early. Left message with pts mother to have pt call us to confirm, change appt if needed. Denny Peon Odell  September 27, 2009 1:45 PM

## 2010-07-15 NOTE — Letter (Signed)
Summary: Out of Work  [facility]  [address]   [place], [place] [address]   Phone: [phone]  Fax: [phone]    September 19, 2009   Employee:  Candice Warner    To Whom It May Concern:   For Medical reasons, please excuse the above named employee from work for the following dates:  Start:   September 23, 2009  End:   September 27, 2009  If you need additional information, please feel free to contact our office.         Sincerely,    Asher Muir MD

## 2010-07-15 NOTE — Progress Notes (Signed)
Summary: phn msg  Phone Note Call from Patient Call back at [phone]   Caller: Other Relative Summary of Call: Pt states that her back is still bothering her and would like to go back to work on Monday May 2nd.  Needs letter for work to return.  Also said that she was to have some PT for her back. Initial call taken by: Clydell Hakim,  October 08, 2009 3:15 PM  Follow-up for Phone Call        sent to pcp Follow-up by: Loralee Pacas CMA,  October 08, 2009 4:19 PM  Additional Follow-up for Phone Call Additional follow up Details #1::        It is fine for her to go back to work.  I will write a letter and route it to you.  I put in a PT referral.  Apprently, I forgot to flag you guys.  Would you set her up for pt?  Sorry about that and thanks! Additional Follow-up by: Asher Muir MD,  October 09, 2009 2:15 PM    Additional Follow-up for Phone Call Additional follow up Details #2::    called pt and told her that letter is ready and will be up front for her to pick up  Follow-up by: Loralee Pacas CMA,  October 09, 2009 2:24 PM

## 2010-07-15 NOTE — Assessment & Plan Note (Signed)
Summary: f/u eo   Vital Signs:  Patient profile:   60 year old female Height:      66 inches Weight:      207.1 pounds BMI:     33.55 Temp:     98.3 degrees F oral Pulse rate:   82 / minute BP sitting:   102 / 63  (left arm) Cuff size:   large  Vitals Entered By: Gladstone Pih (Oct 17, 2009 1:58 PM) CC: back pain, htn Is Patient Diabetic? Yes Did you bring your meter with you today? No Pain Assessment Patient in pain? no        Primary Care Provider:  Asher Muir MD  CC:  back pain and htn.  History of Present Illness: 1.  back pain continues--starts physical therapy on 5/17 (this was the first time it fit into her schedule).   it was better with the tramadol and skelaxin and when she was not working for a few weeks.  but has gone back to working 2 jobs and pain is much worse.  cannot take the muscle relaxer because of sedation.  has not tried taking the tramadol while working   no groin anesthesia or leg weakness.  no leg numbness.    2.  hypertension--had been above goal; so norvasc added to her regimen.  continued on lisinopril-hctz.  today is actually a little on the low side  Habits & Providers  Alcohol-Tobacco-Diet     Tobacco Status: never  Current Medications (verified): 1)  Tylenol Extra Strength 500 Mg  Tabs (Acetaminophen) .... As Needed 2)  Lisinopril-Hydrochlorothiazide 20-25 Mg Tabs (Lisinopril-Hydrochlorothiazide) .... One Tablet Daily 3)  Synthroid 50 Mcg Tabs (Levothyroxine Sodium) .... Take 1 Tablet By Mouth Once A Day 4)  Sertraline Hcl 100 Mg Tabs (Sertraline Hcl) .... Take 1 Tab By Mouth Daily 5)  Hydroxyzine Hcl 25 Mg Tabs (Hydroxyzine Hcl) .... Four Times A Day As Needed 6)  Aspirin 81 Mg Tbec (Aspirin) .... One Tablet Daily 7)  Potassium Chloride Crys Cr 20 Meq Cr-Tabs (Potassium Chloride Crys Cr) .... Take One Tablet By Mouth Twice A Day 8)  Skelaxin 800 Mg Tabs (Metaxalone) .Marland Kitchen.. 1 Tab By Mouth Three Times A Day As Needed Muscle Spasm;  Dispense Generic 9)  Tramadol Hcl 50 Mg Tabs (Tramadol Hcl) .Marland Kitchen.. 1 Tab By Mouth Q 6 Hours As Needed Pain 10)  Norvasc 2.5 Mg Tabs (Amlodipine Besylate) .Marland Kitchen.. 1 Tab By Mouth Daily For Blood Pressure; Dispense Generic 11)  Mupirocin 2 % Oint (Mupirocin) .... Apply To Rash Two Times A Day Until Cleared; Dispense One Tube  Allergies: No Known Drug Allergies  Review of Systems  The patient denies anorexia, weight loss, chest pain, and dyspnea on exertion.    Physical Exam  General:  Well developed, well nourished, in no acute distress. Mild obesity. Msk:  stiff gait Psych:  Cognition and judgment appear intact. Alert and cooperative with normal attention span and concentration. No apparent delusions, illusions, hallucinations   Impression & Recommendations:  Problem # 1:  BACK PAIN, LUMBAR, WITH RADICULOPATHY (ICD-724.4) Assessment Deteriorated  worse since going back to work.  discussed getting mri.  she would prefer to try physical therapy and taking the tramadol while working.  if she does not get better with physical therapy, plan to proceed to mri to see if a candidate for injections.  she will call me earlier if pain worsens Her updated medication list for this problem includes:    Tylenol Extra  Strength 500 Mg Tabs (Acetaminophen) .Marland Kitchen... As needed    Aspirin 81 Mg Tbec (Aspirin) ..... One tablet daily    Skelaxin 800 Mg Tabs (Metaxalone) .Marland Kitchen... 1 tab by mouth three times a day as needed muscle spasm; dispense generic    Tramadol Hcl 50 Mg Tabs (Tramadol hcl) .Marland Kitchen... 1 tab by mouth q 6 hours as needed pain  Orders: [facility]- Est  Level 4 ([address])  Problem # 2:  HYPERTENSION, BENIGN SYSTEMIC (ICD-401.1) Assessment: Improved  actually a little low today.  decrease norvasc to 2.5mg  Her updated medication list for this problem includes:    Lisinopril-hydrochlorothiazide 20-25 Mg Tabs (Lisinopril-hydrochlorothiazide) ..... One tablet daily    Norvasc 2.5 Mg Tabs (Amlodipine besylate) .Marland Kitchen... 1  tab by mouth daily for blood pressure; dispense generic  Orders: [facility]- Est  Level 4 ([address])  Complete Medication List: 1)  Tylenol Extra Strength 500 Mg Tabs (Acetaminophen) .... As needed 2)  Lisinopril-hydrochlorothiazide 20-25 Mg Tabs (Lisinopril-hydrochlorothiazide) .... One tablet daily 3)  Synthroid 50 Mcg Tabs (Levothyroxine sodium) .... Take 1 tablet by mouth once a day 4)  Sertraline Hcl 100 Mg Tabs (Sertraline hcl) .... Take 1 tab by mouth daily 5)  Hydroxyzine Hcl 25 Mg Tabs (Hydroxyzine hcl) .... Four times a day as needed 6)  Aspirin 81 Mg Tbec (Aspirin) .... One tablet daily 7)  Potassium Chloride Crys Cr 20 Meq Cr-tabs (Potassium chloride crys cr) .... Take one tablet by mouth twice a day 8)  Skelaxin 800 Mg Tabs (Metaxalone) .Marland Kitchen.. 1 tab by mouth three times a day as needed muscle spasm; dispense generic 9)  Tramadol Hcl 50 Mg Tabs (Tramadol hcl) .Marland Kitchen.. 1 tab by mouth q 6 hours as needed pain 10)  Norvasc 2.5 Mg Tabs (Amlodipine besylate) .Marland Kitchen.. 1 tab by mouth daily for blood pressure; dispense generic 11)  Mupirocin 2 % Oint (Mupirocin) .... Apply to rash two times a day until cleared; dispense one tube  Patient Instructions: 1)  It was nice to see you today. 2)  For your back, try tramadol at work. 3)  See how physical therapy works for you.  If you are not getting better, we should think about an MRI. 4)  For your blood pressure, start taking the lower dose (2.5 mg) of amlodipine.  It is OK to use up what you already have.   5)  Please schedule a follow-up appointment in 5-6 weeks.  Prescriptions: NORVASC 2.5 MG TABS (AMLODIPINE BESYLATE) 1 tab by mouth daily for blood pressure; dispense generic  #30 x 6   Entered and Authorized by:   Asher Muir MD   Signed by:   Asher Muir MD on 10/17/2009   Method used:   Electronically to        [facility] [address] (retail)       [phone]       [place], [place]  [address]       Ph:  [phone]       Fax: [phone]   RxID:   [id number] SKELAXIN 800 MG TABS (METAXALONE) 1 tab by mouth three times a day as needed muscle spasm; dispense generic  #30 x 1   Entered and Authorized by:   Asher Muir MD   Signed by:   Asher Muir MD on 10/17/2009   Method used:   Electronically to        [facility] [address] (retail)  [address]       [place], [place]  [address]       Ph: [phone]       Fax: [phone]   RxID:   [id number] NORVASC 2.5 MG TABS (AMLODIPINE BESYLATE) 1 tab by mouth daily for blood pressure; dispense generic  #30 x 6   Entered and Authorized by:   Asher Muir MD   Signed by:   Asher Muir MD on 10/17/2009   Method used:   Electronically to        [facility] [address] (retail)       [phone]       [place], [place]  [address]       Ph: [phone]       Fax: [phone]   RxID:   [id number]

## 2010-07-15 NOTE — Progress Notes (Signed)
  Phone Note Outgoing Call   Summary of Call: would you mind calling the mammography center and clarifying the report on ms Ayon.  they say yearly screening, but with next mammo due in August of 2011?  Thanks Initial call taken by: Asher Muir MD,  September 06, 2009 4:06 PM    Gar Gibbon at the breast center stated that ms. Demby will go back to having yearly screenings done starting in Aug. 2011.Loralee Pacas CMA  September 09, 2009 12:04 PM

## 2010-07-15 NOTE — Miscellaneous (Signed)
Summary: what insurance?  Clinical Lists Changes rec'd prescriptions back. she does not have a policy with [facility]. I need to know what company I send  recent rx to [facility] for her to call.when she does ask what her mail order company is & have her read the id number & group number to [place]. forms are in triage.Marland KitchenMarland KitchenGolden Circle RN  Oct 24, 2009 9:48 AM  [facility] per pt. told her I will send them there.Golden Circle RN  Oct 25, 2009 8:50 AM  Medications: Rx of LISINOPRIL-HYDROCHLOROTHIAZIDE 20-25 MG TABS (LISINOPRIL-HYDROCHLOROTHIAZIDE) one tablet daily;  #90 x 3;  Signed;  Entered by: Golden Circle RN;  Authorized by: Asher Muir MD;  Method used: Printed then faxed to [facility], [address] [address], [place], [place]  [address], Ph: [phone], Fax: [phone] Rx of SERTRALINE HCL 100 MG TABS (SERTRALINE HCL) take 1 tab by mouth daily;  #90 x 3;  Signed;  Entered by: Golden Circle RN;  Authorized by: Asher Muir MD;  Method used: Printed then faxed to [facility], [address] [address], [place], [place]  [address], Ph: [phone], Fax: [phone] Rx of HYDROXYZINE HCL 25 MG TABS (HYDROXYZINE HCL) four times a day as needed;  #90 x 1;  Signed;  Entered by: Golden Circle RN;  Authorized by: Asher Muir MD;  Method used: Printed then faxed to [facility], [address] [address], [place], [place]  [address], Ph: [phone], Fax: [phone]    Prescriptions: HYDROXYZINE HCL 25 MG TABS (HYDROXYZINE HCL) four times a day as needed  #90 x 1   Entered by:   Golden Circle RN   Authorized by:   Asher Muir MD   Signed by:   Golden Circle RN on 10/25/2009   Method used:   Printed then faxed to ...       [facility] Environmental education officer)       [address] [address]       [place], [place]  [address]       Ph: [phone]       Fax: [phone]   RxID:   [id number] SERTRALINE HCL 100 MG TABS (SERTRALINE HCL) take 1 tab by mouth daily  #90 x 3   Entered by:   Golden Circle RN   Authorized by:   Asher Muir  MD   Signed by:   Golden Circle RN on 10/25/2009   Method used:   Printed then faxed to ...       [facility] Environmental education officer)       [address] [address]       [place], [place]  [address]       Ph: [phone]       Fax: [phone]   RxID:   [id number] LISINOPRIL-HYDROCHLOROTHIAZIDE 20-25 MG TABS (LISINOPRIL-HYDROCHLOROTHIAZIDE) one tablet daily  #90 x 3   Entered by:   Golden Circle RN   Authorized by:   Asher Muir MD   Signed by:   Golden Circle RN on 10/25/2009   Method used:   Printed then faxed to ...       [facility] Environmental education officer)       [address] [address]       [place], [place]  [address]       Ph: [phone]       Fax: [phone]   RxID:   [id number]  faxed to her pharmacy.Golden Circle RN  Oct 25, 2009 9:52 AM

## 2010-07-15 NOTE — Assessment & Plan Note (Signed)
Summary: FU/KH   Vital Signs:  Patient profile:   60 year old female Weight:      203.8 pounds Temp:     97.7 degrees F oral Pulse rate:   68 / minute Pulse rhythm:   regular BP sitting:   144 / 74  (left arm) Cuff size:   regular  Vitals Entered By: Loralee Pacas CMA (September 26, 2009 1:43 PM)  Primary Care Provider:  Asher Muir MD  CC:  fatigue, dm, htn, hld, back, and rash.  History of Present Illness: 1.  fatigue/sleepy--seen last week.  working 2 jobs and sleeping only about 4 hours per night.  wrote her out of her evening job for the past week.  today feels better rested.   no fever, wt loss, skin changes, hea/cold intolerance.  2.  diabetes--A1C at goal at 6.3 on no meds.  no chest pain.  no hypoglycemic episodes  3.  hypertension--above goal past 2 visits.  on lisinopril-hctz 20-25.  no med side effects.    4.  hyperlipidemia--ldl is 106.   on no chol meds  5.  back--lower back pain radiating down left leg (no numbness/tingling).  see hpi from 4/7 for details.  pain is much better with tramadol since last visit.  interested in pt.  no red flag symptoms.  not better with any particular position  6.  rash on gluteus--for past several days.  painful but not itchy.  oozing ?pus.    Allergies: No Known Drug Allergies  Physical Exam  General:  Well-groomed, overweight, pleasant Skin:  several ulcerations.  no draining pus.  no surrounding erythema   Detailed Back/Spine Exam  General:    Well developed, well nourished, in no acute distress. Mild obesity.  Gait:    normal  Lumbosacral Exam:  Inspection-deformity:    Normal Palpation-spinal tenderness:     ttp over l-spine and paraspinous muscles Range of Motion:    Forward Flexion:   85 degrees    Hyperextension:   20 degrees    Right Lateral Bend:   25 degrees    Left Lateral Bend:   25 degrees Sitting Straight Leg Raise:    Right:  positive    Left:  positive Sciatic Notch:    There is no sciatic  notch tenderness. Toe Walking:    Right:  normal Heel Walking:    Right:  normal   Impression & Recommendations:  Problem # 1:  FATIGUE (ICD-780.79) Assessment Improved check tsh and cbc.  however, I suspect that sleep deprivation is the primary cause Orders: [facility] ([address]) [facility]- Est  Level 4 (91478)  Problem # 2:  BACK PAIN, LUMBAR, WITH RADICULOPATHY (ICD-724.4) Assessment: Improved tramadol and skelaxin helps; so will continue.  refer to physical therapy.  consider plain films Her updated medication list for this problem includes:    Tylenol Extra Strength 500 Mg Tabs (Acetaminophen) .Marland Kitchen... As needed    Aspirin 81 Mg Tbec (Aspirin) ..... One tablet daily    Skelaxin 800 Mg Tabs (Metaxalone) .Marland Kitchen... 1 tab by mouth three times a day as needed muscle spasm; dispense generic    Tramadol Hcl 50 Mg Tabs (Tramadol hcl) .Marland Kitchen... 1 tab by mouth q 6 hours as needed pain  Orders: Physical Therapy Referral (PT) [facility]- Est  Level 4 ([address])  Problem # 3:  HYPERLIPIDEMIA-MIXED (ICD-272.4) Assessment: Unchanged  recheck ldl.  add statin if >100.  consider statin if between 70 and 100  Orders: [facility]- Est  Level 4 ([address])  Problem # 4:  DIABETES MELLITUS, TYPE II, WITHOUT COMPLICATIONS (ICD-250.00) Assessment: Unchanged great control without oral hypoglycemics Her updated medication list for this problem includes:    Lisinopril-hydrochlorothiazide 20-25 Mg Tabs (Lisinopril-hydrochlorothiazide) ..... One tablet daily    Aspirin 81 Mg Tbec (Aspirin) ..... One tablet daily  Problem # 5:  SKIN RASH (ICD-782.1) Assessment: New  suspect impetigo.  I suppose it could be zoster, but only a few lesions in one small area, not a full dermatome.  bactroban  Orders: [facility]- Est  Level 4 ([address])  Complete Medication List: 1)  Tylenol Extra Strength 500 Mg Tabs (Acetaminophen) .... As needed 2)  Lisinopril-hydrochlorothiazide 20-25 Mg Tabs (Lisinopril-hydrochlorothiazide) .... One tablet daily 3)   Synthroid 50 Mcg Tabs (Levothyroxine sodium) .... Take 1 tablet by mouth once a day 4)  Sertraline Hcl 100 Mg Tabs (Sertraline hcl) .... Take 1 tab by mouth daily 5)  Hydroxyzine Hcl 25 Mg Tabs (Hydroxyzine hcl) .... Four times a day as needed 6)  Aspirin 81 Mg Tbec (Aspirin) .... One tablet daily 7)  Potassium Chloride Crys Cr 20 Meq Cr-tabs (Potassium chloride crys cr) .... Take one tablet by mouth twice a day 8)  Skelaxin 800 Mg Tabs (Metaxalone) .Marland Kitchen.. 1 tab by mouth three times a day as needed muscle spasm; dispense generic 9)  Tramadol Hcl 50 Mg Tabs (Tramadol hcl) .Marland Kitchen.. 1 tab by mouth q 6 hours as needed pain 10)  Norvasc 5 Mg Tabs (Amlodipine besylate) .Marland Kitchen.. 1 tab by mouth daily for blood pressure 11)  Mupirocin 2 % Oint (Mupirocin) .... Apply to rash two times a day until cleared; dispense one tube  Other Orders: T-Lipoprotein (LDL cholesterol)  ([address]95-18841) TSH-[facility] ([address]01)  Patient Instructions: 1)  It was nice to see you today. 2)  I think you should stay home form work one more week.   3)  We will refer you to physical therapy for your back. 4)  For your blood pressure, start amlodipine. 5)  I will call your or send you a letter with your lab results. 6)  Start the exercises I gave you. 7)  Please schedule a follow-up appointment in 2-3 weeks to follow up your back and hypertension. Prescriptions: MUPIROCIN 2 % OINT (MUPIROCIN) apply to rash two times a day until cleared; dispense one tube  #1 x 0   Entered and Authorized by:   Asher Muir MD   Signed by:   Asher Muir MD on 09/27/2009   Method used:   Electronically to        [facility] [address] (retail)       [phone]       [place], [place]  [address]       Ph: [phone]       Fax: [phone]   RxID:   [id number] NORVASC 5 MG TABS (AMLODIPINE BESYLATE) 1 tab by mouth daily for blood pressure  #30 x 6   Entered and Authorized by:   Asher Muir MD    Signed by:   Asher Muir MD on 09/26/2009   Method used:   Electronically to        [facility] [address] (retail)       [phone]       [place], [place]  [address]       Ph: [phone]       Fax: [phone]  RxID:   [id number]   Prevention & Chronic Care Immunizations   Influenza vaccine: Not documented    Tetanus booster: 09/13/1996: Done.   Tetanus booster due: 09/14/2006    Pneumococcal vaccine: Not documented  Colorectal Screening   Hemoccult: Done.  (12/18/2004)   Hemoccult due: Not Indicated    Colonoscopy: Done.  (05/15/2005)   Colonoscopy due: 05/16/2015  Other Screening   Pap smear: Normal  (07/27/2008)   Pap smear due: 07/2009    Mammogram: BI-RADS CATEGORY 1:  Negative.^MM DIGITAL DIAGNOSTIC UNILAT L  (09/06/2009)   Mammogram action/deferral: mammogram ordered  (01/11/2008)   Mammogram due: 01/2010   Smoking status: never  (10/29/2008)  Diabetes Mellitus   HgbA1C: 6.3  (09/19/2009)   Hemoglobin A1C due: 12/26/2008    Eye exam: Not documented    Foot exam: yes  (10/29/2008)   High risk foot: Not documented   Foot care education: Not documented    Urine microalbumin/creatinine ratio: Not documented    Diabetes flowsheet reviewed?: Yes   Progress toward A1C goal: At goal  Lipids   Total Cholesterol: 186  (04/30/2009)   Lipid panel action/deferral: LDL Direct Ordered   LDL: 784  (04/30/2009)   LDL Direct: Not documented   HDL: 50.50  (04/30/2009)   Triglycerides: 147.0  (04/30/2009)    SGOT (AST): 19  (04/30/2009)   SGPT (ALT): 17  (04/30/2009)   Alkaline phosphatase: 63  (04/30/2009)   Total bilirubin: 0.8  (04/30/2009)    Lipid flowsheet reviewed?: Yes   Progress toward LDL goal: Unchanged    Stage of readiness to change (lipid management): Preparation  Hypertension   Last Blood Pressure: 144 / 74  (09/26/2009)   Serum creatinine: 0.86  (09/19/2009)   Serum potassium 3.8   (09/19/2009)    Hypertension flowsheet reviewed?: Yes   Progress toward BP goal: Unchanged    Stage of readiness to change (hypertension management): Action  Self-Management Support :   Personal Goals (by the next clinic visit) :     Personal A1C goal: 7  (04/15/2009)     Personal blood pressure goal: 130/80  (04/15/2009)     Personal LDL goal: 100  (04/15/2009)    Diabetes self-management support: Not documented    Diabetes self-management support not done because: Good outcomes  (04/15/2009)    Hypertension self-management support: BP self-monitoring log, Written self-care plan  (09/26/2009)   Hypertension self-care plan printed.    Hypertension self-management support not done because: Good outcomes  (04/15/2009)    Lipid self-management support: Lipid monitoring log, Written self-care plan  (09/26/2009)   Lipid self-care plan printed.    Lipid self-management support not done because: Good outcomes  (04/15/2009)

## 2010-07-17 NOTE — Progress Notes (Signed)
Summary: Rx  Phone Note Call from Patient   Summary of Call: Needs all meds to be [facility] to [facility], 3 month supply, pts ins changed Initial call taken by: Knox Royalty,  June 12, 2010 2:16 PM  Follow-up for Phone Call        spoke with patient and  advised that at visit with Dr. Lafonda Mosses in May,  Dr. Lafonda Mosses had wanted her to come back in 5-6 weeks. advised we need to schedule appointment with her new doctor. appointment scheduled for 07/01/2010. this is first available with Dr. Cristal Ford.. she has enough meds to last until then and will get RX for refills at that time. Follow-up by: Theresia Lo RN,  June 12, 2010 3:08 PM

## 2010-07-17 NOTE — Assessment & Plan Note (Signed)
Summary: BP follow up and get refills on meds/ls   Vital Signs:  Patient profile:   60 year old female Weight:      212.5 pounds Pulse rate:   62 / minute BP sitting:   129 / 68  (left arm) Cuff size:   large  Vitals Entered By: Arlyss Repress CMA, (July 01, 2010 2:51 PM) CC: f/up HTN and refill meds. Is Patient Diabetic? No Pain Assessment Patient in pain? no        Primary Provider:  Lloyd Huger MD  CC:  f/up HTN and refill meds..  History of Present Illness: 1. HTN. Takes blood pressure at home, gets values consistently 130/70s. Is taking medication as prescribed, but needs refills for new pharmacy. Tries to exercise on a daily basis. Weight has increased gradually since 2008, has gained 5 lb since last OV in 09/2009.   2. Depression. Has been on sertraline since 2002 after losing her son and her husband. Thinks she is stable now, does not lash out at people as she has in the past. Helps her sleep.  3. Chronic pain. Is no longer taking chronic pain meds tramadol. Now only uses OTC tylenol as needed.   4. Health maintenance-Cannot remember her last colonoscopy, thinks it was >10 years ago. Had mammogram this year, PAP in 2008 was normal.   Allergies: No Known Drug Allergies  Past History:  Past Medical History: Last updated: 05/31/2009 1.  Goiter s/p R thyroid lobectomy, now hypothyroid 2.  DM2, diet-controlled 3.  HTN 4.  Vertigo: BPV 5.  Hyperlipidemia 6.  Depression 7.  Chest pain: ETT-myoview (11/10) 4', stopped due to shortness of breath, EF 69%, reversible anterior defect representing either ischemia or shifting breast artifact.  LHC (12/10) showed no angiographic coronary disease.  8.  Echo (11/10): mild LVH, EF 65%, no regional wall motion abnormalities, no significant valvular abnormalities.  9.  48 hour holter (12/10): HR range 64-125, average 85.  No pauses or significant arrhythmias.   Past Surgical History: Last updated: 09/14/2007 11/2001: thyroid  US, Rlobe w/ heterogeneous mass, 6x4x4.  Colloid cyst L - 11/24/2001,  12/2001: thyroid FNA- hyperplastic nodule, benign - 12/23/2001,  10/01 thyroid US - enlarged R lobe with multiple cystic & solid nodules - 03/15/2000,  10/02 - pelvic u/s - fibroids - 04/11/2001,  Endometrial Biopsy - 12/03/2003,  L knee cyst removal--8/93 -,  R thyroid lobectomy-no malignancy-Dr. Derrell Lolling - 12/15/2002,  Tubal ligation -    Family History: Last updated: 04/16/2009 Mother with MI at 3, father with MI, ? age.    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  Divorced, 4 children, 1 adopted.  No EtOH, drugs.  No tobacco.  Husband died Apr 21, 2023 from colon CA.; Son murdered (hanging) 22-Jul-1999, father died 1999-07-31.  Lives w/ 14yo son (adopted).    Risk Factors: Smoking Status: never (10/17/2009)  Review of Systems  The patient denies anorexia, fever, weight loss, chest pain, syncope, dyspnea on exertion, peripheral edema, prolonged cough, headaches, and breast masses.    Physical Exam  General:  Well-developed,well-nourished,in no acute distress; alert,appropriate and cooperative throughout examination Head:  normocephalic and atraumatic.   Eyes:  PERRLA, EOMI Mouth:  Oral mucosa and oropharynx without lesions or exudates.  Teeth in good repair. Lungs:  Normal respiratory effort, chest expands symmetrically. Lungs are clear to auscultation, no crackles or wheezes. Heart:  Normal rate and regular rhythm. S1 and S2 normal without gallop,  murmur, click, rub or other extra sounds. Abdomen:  Bowel sounds positive,abdomen soft and non-tender without masses, organomegaly or hernias noted. Extremities:  No clubbing, cyanosis, edema, or deformity noted with normal full range of motion of all joints.   Neurologic:  No cranial nerve deficits noted. Station and gait are normal. Sensory, motor and coordinative functions appear intact. Skin:  Intact without suspicious lesions or  rashes Psych:  Cognition and judgment appear intact. Alert and cooperative with normal attention span and concentration. No apparent delusions, illusions, hallucinations   Impression & Recommendations:  Problem # 1:  HYPERTENSION, BENIGN SYSTEMIC (ICD-401.1)  Controlled. Refilled medications. Stressed importance of exercise, diet, and weight control.  The following medications were removed from the medication list:    Norvasc 2.5 Mg Tabs (Amlodipine besylate) .Marland Kitchen... 1 tab by mouth daily for blood pressure; dispense generic Her updated medication list for this problem includes:    Lisinopril-hydrochlorothiazide 20-25 Mg Tabs (Lisinopril-hydrochlorothiazide) ..... One tablet daily  Orders: [facility]- Est  Level 4 ([address])  Problem # 2:  DIABETES MELLITUS, TYPE II, WITHOUT COMPLICATIONS (ICD-250.00) Patient denies having diabetes. Has borderline A1c at 6.3. Controlled only with diet. Will follow.   Her updated medication list for this problem includes:    Lisinopril-hydrochlorothiazide 20-25 Mg Tabs (Lisinopril-hydrochlorothiazide) ..... One tablet daily    Aspirin 81 Mg Tbec (Aspirin) ..... One tablet daily  Problem # 3:  DEPRESSION, MAJOR, RECURRENT (ICD-296.30)  Stable on sertraline currently. Will continue to follow.   Orders: [facility]- Est  Level 4 ([address])  Problem # 4:  BACK PAIN, LUMBAR, WITH RADICULOPATHY (ICD-724.4)  Controlled only on OTC acetaminophen currently. Weaned off tramadol.   The following medications were removed from the medication list:    Skelaxin 800 Mg Tabs (Metaxalone) .Marland Kitchen... 1 tab by mouth three times a day as needed muscle spasm; dispense generic    Tramadol Hcl 50 Mg Tabs (Tramadol hcl) .Marland Kitchen... 1 tab by mouth q 6 hours as needed pain Her updated medication list for this problem includes:    Tylenol Extra Strength 500 Mg Tabs (Acetaminophen) .Marland Kitchen... As needed    Aspirin 81 Mg Tbec (Aspirin) ..... One tablet daily  Orders: [facility]- Est  Level 4 ([address])  Problem # 5:   Preventive Health Care (ICD-V70.0) Gave info regarding colonoscopy screening. Unsure of her last exam, and we have no records. Will attempt to obtain this. Up to date on her mammogram screening, and defers Pap smear today.   Problem # 6:  HYPOTHYROIDISM, UNSPECIFIED (ICD-244.9) TSH wnl this year, no symptoms of hypo or hyperthyroidism. Will follow up at next visit.   Her updated medication list for this problem includes:    Synthroid 50 Mcg Tabs (Levothyroxine sodium) .Marland Kitchen... Take 1 tablet by mouth once a day  Complete Medication List: 1)  Tylenol Extra Strength 500 Mg Tabs (Acetaminophen) .... As needed 2)  Lisinopril-hydrochlorothiazide 20-25 Mg Tabs (Lisinopril-hydrochlorothiazide) .... One tablet daily 3)  Synthroid 50 Mcg Tabs (Levothyroxine sodium) .... Take 1 tablet by mouth once a day 4)  Sertraline Hcl 100 Mg Tabs (Sertraline hcl) .... Take 1 tab by mouth daily 5)  Hydroxyzine Hcl 25 Mg Tabs (Hydroxyzine hcl) .... Four times a day as needed 6)  Aspirin 81 Mg Tbec (Aspirin) .... One tablet daily 7)  Potassium Chloride Crys Cr 20 Meq Cr-tabs (Potassium chloride crys cr) .... Take one tablet by mouth twice a day 8)  Mupirocin 2 % Oint (Mupirocin) .... Apply to rash two times a day until cleared;  dispense one tube  Patient Instructions: 1)  Nice to meet you. 2)  Please call to schedule your colonoscopy. 3)  Your refills are done.  4)  Schedule an appointment in 6 months or sooner if needed! Prescriptions: HYDROXYZINE HCL 25 MG TABS (HYDROXYZINE HCL) four times a day as needed  #90 x 1   Entered and Authorized by:   Lloyd Huger MD   Signed by:   Lloyd Huger MD on 07/01/2010   Method used:   Print then Give to Patient   RxID:   [id number] POTASSIUM CHLORIDE CRYS CR 20 MEQ CR-TABS (POTASSIUM CHLORIDE CRYS CR) Take one tablet by mouth twice a day  #180 x 3   Entered and Authorized by:   Lloyd Huger MD   Signed by:   Lloyd Huger MD on 07/01/2010   Method used:   Print then Give  to Patient   RxID:   [id number] SERTRALINE HCL 100 MG TABS (SERTRALINE HCL) take 1 tab by mouth daily  #90 x 3   Entered and Authorized by:   Lloyd Huger MD   Signed by:   Lloyd Huger MD on 07/01/2010   Method used:   Print then Give to Patient   RxID:   [id number] SYNTHROID 50 MCG TABS (LEVOTHYROXINE SODIUM) Take 1 tablet by mouth once a day  #90 x 3   Entered and Authorized by:   Lloyd Huger MD   Signed by:   Lloyd Huger MD on 07/01/2010   Method used:   Print then Give to Patient   RxID:   [id number] LISINOPRIL-HYDROCHLOROTHIAZIDE 20-25 MG TABS (LISINOPRIL-HYDROCHLOROTHIAZIDE) one tablet daily  #90 x 3   Entered and Authorized by:   Lloyd Huger MD   Signed by:   Lloyd Huger MD on 07/01/2010   Method used:   Print then Give to Patient   RxID:   [id number]    Orders Added: 1)  [facility]- Est  Level 4 [[address]]

## 2010-09-16 ENCOUNTER — Ambulatory Visit (INDEPENDENT_AMBULATORY_CARE_PROVIDER_SITE_OTHER): Payer: Managed Care, Other (non HMO) | Admitting: Family Medicine

## 2010-09-16 VITALS — BP 127/72 | HR 70 | Temp 97.9°F | Ht 66.5 in | Wt 209.0 lb

## 2010-09-16 DIAGNOSIS — R252 Cramp and spasm: Secondary | ICD-10-CM | POA: Insufficient documentation

## 2010-09-16 DIAGNOSIS — E039 Hypothyroidism, unspecified: Secondary | ICD-10-CM

## 2010-09-16 LAB — COMPREHENSIVE METABOLIC PANEL
Albumin: 4.2 g/dL (ref 3.5–5.2)
Alkaline Phosphatase: 57 U/L (ref 39–117)
CO2: 27 mEq/L (ref 19–32)
Glucose, Bld: 145 mg/dL — ABNORMAL HIGH (ref 70–99)
Potassium: 4 mEq/L (ref 3.5–5.3)
Sodium: 140 mEq/L (ref 135–145)
Total Protein: 7.3 g/dL (ref 6.0–8.3)

## 2010-09-16 NOTE — Patient Instructions (Signed)
You can take some Motrin/Advil/Ibuprofen (all the same medicine) 400 mg every 4-6 hours as needed for leg pain.  We are testing your blood today to see if there is a blood cause for your cramping.  Take the Thyroid medicine 30 min before all the other meds and food.  We are testing your thyroid level today.

## 2010-09-16 NOTE — Assessment & Plan Note (Signed)
Plan to check TSH today. Will call patient with results.

## 2010-09-16 NOTE — Progress Notes (Signed)
Leg Cramps: Pt says that she has had upper thigh cramps and left foot cramps for the last 2 weeks. They last 1-2 minutes and she gets up and has to walk around when they occur. Sometimes they occur at night and sometimes during the day. She has not had any new accidents, no new exercise programs. She feels like they are getting worse. She has not really tried anything to make them stop. She is continuing to take her potassium pills as directed on Rx. Discussed electrolytes today and decided to check them. Pt's only other problems include HTN and hypothyroidism and depression.   ROS: neg except as noted in HPI.   PE:  Gen: NAD, sitting comfortably.  HEENt: [place]/AT, EOMI, PERRL, no deformities noted.  CV: RRR, no murmures PUlm: CTAB, no wheezing or crackles Psych: no mood abnormalities

## 2010-09-16 NOTE — Assessment & Plan Note (Signed)
Pt has leg cramps that seem to come and go.  She is continuing to take her potassium pills but has not had her BMET checked in a while. Concern for electrolyte imbalance.  Will check electrolytes and liver function. Will also check her TSH.  Will call her with results.

## 2010-09-22 ENCOUNTER — Telehealth: Payer: Self-pay | Admitting: Family Medicine

## 2010-09-22 NOTE — Telephone Encounter (Signed)
Pt is wanting to know lab results from last week

## 2010-09-23 NOTE — Telephone Encounter (Signed)
Attempted to call patient. Left message regarding her recent labwork ordered by Dr. Clotilde Dieter. Labs were normal including electrolytes and thyroid. She may make f/u appt if cramps are still bothersome.

## 2010-09-24 ENCOUNTER — Telehealth: Payer: Self-pay | Admitting: *Deleted

## 2010-09-24 NOTE — Telephone Encounter (Signed)
Message copied by Jimmy Footman on Wed Sep 24, 2010  1:56 PM ------      Message from: Jamie Brookes      Created: Tue Sep 23, 2010  5:50 PM      Regarding: please call this patient.        Please call this patient and ask her if she was fasting for at least 8 hours before this test?       Her blood sugar was the highest we have on record and if it was a fasting lab then ask her to make an appointment to come in and get an A1c done to look for diabetes.       Please put this order in for a future A1c if she was truly fasting. Thanks, Hospital doctor            ----- Message -----         From: Lab In [place] Interface         Sent: 09/16/2010  10:22 PM           To: Visual merchandiser

## 2010-09-24 NOTE — Telephone Encounter (Signed)
LVM for patient to call back about below message

## 2010-09-24 NOTE — Telephone Encounter (Signed)
lvm for pt to call back..Tyriana Helmkamp Lynetta  

## 2010-09-25 NOTE — Telephone Encounter (Signed)
Spoke with Bonita Quin and she says that she had eaten that morning before she came. Said she has an appointment coming up and that she will try to be fasting before so another sugar level can be checked

## 2010-09-30 ENCOUNTER — Encounter: Payer: Self-pay | Admitting: Family Medicine

## 2010-09-30 ENCOUNTER — Ambulatory Visit (INDEPENDENT_AMBULATORY_CARE_PROVIDER_SITE_OTHER): Payer: Managed Care, Other (non HMO) | Admitting: Family Medicine

## 2010-09-30 VITALS — BP 110/69 | HR 55 | Temp 97.9°F | Ht 66.5 in | Wt 212.0 lb

## 2010-09-30 DIAGNOSIS — IMO0002 Reserved for concepts with insufficient information to code with codable children: Secondary | ICD-10-CM

## 2010-09-30 DIAGNOSIS — E119 Type 2 diabetes mellitus without complications: Secondary | ICD-10-CM

## 2010-09-30 DIAGNOSIS — E785 Hyperlipidemia, unspecified: Secondary | ICD-10-CM

## 2010-09-30 DIAGNOSIS — E039 Hypothyroidism, unspecified: Secondary | ICD-10-CM

## 2010-09-30 DIAGNOSIS — R252 Cramp and spasm: Secondary | ICD-10-CM

## 2010-09-30 LAB — POCT GLYCOSYLATED HEMOGLOBIN (HGB A1C): Hemoglobin A1C: 7.1

## 2010-09-30 LAB — LIPID PANEL
Cholesterol: 198 mg/dL (ref 0–200)
HDL: 53 mg/dL (ref >39)
LDL Cholesterol: 107 mg/dL — ABNORMAL HIGH (ref 0–99)
Total CHOL/HDL Ratio: 3.7 Ratio
Triglycerides: 189 mg/dL — ABNORMAL HIGH (ref <150)
VLDL: 38 mg/dL (ref 0–40)

## 2010-09-30 NOTE — Patient Instructions (Signed)
Your blood pressure looks great! I will call about your lab tests if they aren't normal. You can prevent diabetes with good diet and exercise. You can take tylenol 650mg  every 4-6 hours for your back pain. Make an appointment in 4-6 months for check-up. Drink plenty of fluid to prevent cramping!

## 2010-09-30 NOTE — Assessment & Plan Note (Signed)
Borderline A1c one year ago. Will check again today. Encourage lifestyle modifications including exercise and diet to avoid diabetic complications. Will consider starting metformin if deteriorated.

## 2010-09-30 NOTE — Assessment & Plan Note (Signed)
Improved per patient. Recent electrolytes and TSH were normal. If worsens again, will consider evaluation of CBC, Mg, B12 for other organic causes. Encouraged plenty of fluids.

## 2010-09-30 NOTE — Assessment & Plan Note (Signed)
Will avoid NSAIDs if possible. Tylenol prn for pain.

## 2010-09-30 NOTE — Assessment & Plan Note (Signed)
Recent TSH within normal limits. Will continue current management and f/u in one year.

## 2010-09-30 NOTE — Assessment & Plan Note (Signed)
FLP today, last d-LDL was 87 one year ago. No meds currently. Encouraged lifestyle modifications.

## 2010-09-30 NOTE — Progress Notes (Signed)
  Subjective:    [name], female    DOB: 10-10-1950, 60 y.o.   MRN: [id number]  HPI 1. DM. Patient told she had borderline diabetes years ago. No medications and does not check CBGs. Last A1c was 6.4 in 09/2009. On recent visit for leg cramps, blood sugar on BMET noted to be slightly elevated to 150s, but was not fasting. Patient advised to return for fasting sugar check, miscommunication resulted in her fasting all day for her late afternoon appointment. Denies numbness, tingling in feet, visual problems.  2. Leg cramps. Have improved this week after changing the time of day she takes synthroid. Recent TSH and lytes were normal.   3. HLD. No meds currently. LDL was 87 in 09/2009. Records reviewed: Cardiac cath in 05/2009 was negative for obstructive CAD.   Review of Systems See HPI. Denies difficulty walking, dizzyness, recent weight changes. Endorses chronic fatigue.     Objective:   Physical Exam  Vitals reviewed. Constitutional: She is oriented to person, place, and time. She appears well-developed and well-nourished. No distress.  HENT:  Head: Normocephalic and atraumatic.  Eyes: EOM are normal. Pupils are equal, round, and reactive to light.  Neck: Normal range of motion. Neck supple.  Cardiovascular: Normal rate, regular rhythm and normal heart sounds.   Pulmonary/Chest: Effort normal. No respiratory distress.  Musculoskeletal: Normal range of motion. She exhibits no edema.       2+ DP pulses bilaterally. No edema or skin breakdown. Legs NT.   Neurological: She is alert and oriented to person, place, and time.  Skin: She is not diaphoretic.  Psychiatric: She has a normal mood and affect. Thought content normal.          Assessment & Plan:

## 2010-10-02 ENCOUNTER — Telehealth: Payer: Self-pay | Admitting: Family Medicine

## 2010-10-02 NOTE — Telephone Encounter (Signed)
Pt is returning call from Dr Cristal Ford

## 2010-10-03 MED ORDER — METFORMIN HCL 500 MG PO TABS: 500.0000 mg | ORAL_TABLET | Freq: Every day | ORAL | Status: DC

## 2010-10-03 NOTE — Telephone Encounter (Signed)
Informed of new diagnosis of diabetes mellitus with A1C 7.1. Discussed starting regular exercise and continuing weight loss. Will start metformin daily then increase to BID on 4/22 if tolerated. Asked pt to make an appointment in next few weeks to discuss this diagnosis in more detail.

## 2010-10-31 NOTE — Op Note (Signed)
[facility]  Patient:    Candice Warner, Candice Warner                     MRN: [id number] Proc. Date: 09/04/99 Adm. Date:  [id number] Attending:  Colbert Ewing                           Operative Report  PREOPERATIVE DIAGNOSIS:  Left shoulder chronic impingement with possible rotator cuff tear.  Marked subacromial spurring with degenerative joint disease acromioclavicular joint.  POSTOPERATIVE DIAGNOSIS:  Left shoulder chronic impingement with possible rotator cuff tear.  Marked subacromial spurring with degenerative joint disease acromioclavicular joint. Complete chronic moderately retracted rotator cuff tear.  PROCEDURE:  Left shoulder examination under anesthesia, arthroscopy with debridement of rotator cuff and debridement of partial tearing long head biceps  tendon.  Arthroscopic acromioplasty, coracoacromial ligament release and distal  clavicle excision.  Open repair rotator cuff utilizing Concept repair system, drill holes through bone, suture tied over bone.  SURGEON:  Loreta Ave, M.D.  ASSISTANT:  Arlys John D. Petrarca, P.A.-C.  ANESTHESIA:  General.  BLOOD LOSS:  Minimal.  SPECIMENS:  None.  CULTURES:  None.  COMPLICATIONS:  None.  DRESSING:  Soft compressive with shoulder immobilizer.  PROCEDURE:  Patient brought to the operating room and after adequate anesthesia had been obtained, both shoulders examined.  Although, she has limited external rotation on the left of about 60 degrees, this was symmetric bilaterally.  No instability.  Placed in a beach chair position on a shoulder positioner, prepped and draped in usual sterile fashion.  Three standard arthroscopic portals, anterior, posterior and lateral.  Shoulder entered with a blunt obturator, distended and inspected.  Glenohumeral joint, labrum, capsule and ligamentous structures all looked good.  Complete rotator cuff tear supraspinatus starting n front of the  biceps and extending to the back of the supraspinatus.  Moderate attrition throughout the cuff with some retraction, but this was mobile enough o allow for repair.  The biceps tendon was partially subluxed posteriorly because of the tear.  Tearing of at least a 1/3 of the substance of the biceps, which was debrided back to healthy tissue arthroscopically.  The cannula redirected subacromially.  Marked subacromial spurring, type 4 acromion.  Converted to a type 1 acromion with shaver and high-speed bur utilizing all three portals.  Distal clavicle had grade 4 changes that also contributed to impingement and was treated with resection of the lateral cm.  CA ligament release as well.  Adequacy of decompression confirmed viewing from all portals.  Instruments and fluid removed. Lateral portal was turned into a deltoid-splitting incision off the anterolateral tip of the acromion.  Skin and subcutaneous tissue, deltoid divided staying above the axillary nerve branch to the deltoid.  Subacromial space entered.  Adequacy of decompression confirmed as good digitally.  The rotator cuff was debrided. Bone trough created off the articular cartilage surface.  The cuff was appropriately  mobilized.  A series of three #2-0 Ethibond sutures were weaved well medial into the tendon because of tissue quality being poor.  These were then brought through multiple drill holes starting right behind the biceps tendon and extending back to the back of the supraspinatus attachment.  All sutures brought through drill holes and once they were placed with the Concept repair system, they were firmly tied  over bone.  The arm could then be brought down to the side without undue tension.  The posterior subluxation of the biceps was obliterated by repair of the cuff right behind this, reconstructing the posterior aspect of the tendinous groove. Although, there was moderate attrition of the cuff, I did get a  nice, firm watertight closure that was not under undue tension even with full passive motion. Wound irrigated, deltoid closed with Vicryl.  Skin and subcutaneous tissue with  Vicryl and portals with nylon.  Margins of the wound and subacromial space injected with Marcaine.  Sterile compressive dressing and shoulder immobilizer applied.  Anesthesia reversed, brought to recovery room.  Tolerated surgery well, no complications. DD:  09/04/99 TD:  09/04/99 Job: [id number]

## 2010-10-31 NOTE — Op Note (Signed)
NAME:  Candice Warner, Candice Warner                         ACCOUNT NO.:  [id number]   MEDICAL RECORD NO.:  [id number]                   PATIENT TYPE:  AMB   LOCATION:  DAY                                  FACILITY:  [facility]   PHYSICIAN:  Angelia Mould. Derrell Lolling, M.D.             DATE OF BIRTH:  [birth year]   DATE OF PROCEDURE:  12/04/2002  DATE OF DISCHARGE:                                 OPERATIVE REPORT   PREOPERATIVE DIAGNOSIS:  Right thyroid mass.   POSTOPERATIVE DIAGNOSIS:  Follicular lesion of right thyroid lobe.   OPERATION PERFORMED:  Right thyroid lobectomy with frozen section.   SURGEON:  Angelia Mould. Derrell Lolling, M.D.   FIRST ASSISTANT:  Gita Kudo, M.D.   INDICATIONS FOR PROCEDURE:  This is a 60 year old black female who has had a  mass in her right thyroid lobe for several years. She will admit to noticing  a large mass for two years but denies pain, voice change, breathing  problems, swallowing problems or pressure sensation. Recent evaluation  includes an ultrasound which shows a solid lesion with some cystic component  measuring 6 cm x 4.7 cm. A fine needle aspiration cytology was consistent  with a hyperplastic nodule. Her thyroid function tests are basically normal.  On exam, she has a moderately firm smooth mobile mass in the right thyroid  lobe which is quite large, perhaps 4-5 cm on exam and there is no  adenopathy. The trachea is deviated to the left. She is brought to the  operating room electively.   TECHNIQUE:  Following the induction of general endotracheal anesthesia, the  patient's neck was extended and her arms were placed at here side. The neck  was prepped and draped in a sterile fashion. A transverse Kocher incision  was made in a skin crease about 2 cm above the suprasternal notch.  Dissection was carried down through the subcutaneous tissue and the  platysmal muscle. The skin and platysmal muscle flaps were elevated  superiorly and inferiorly. Self retaining  retractor was placed. The strap  muscles were divided in the midline and retracted. Tracheal deviation was  fairly significant but the anatomy was fairly straight forward to identify.  The left thyroid lobe was quite small and atrophic and there was no palpable  mass. The right thyroid lobe was quite large and multicolored and firm but  was not rock hard and there was no signs of any invasion. I dissected the  inferior pole away from its attachments taking care to preserve the inferior  parathyroid gland. The superior thyroid pole vessels were isolated, secured  with 2-0 silk ties, ligation in continuity and a medium clip was placed on  the superior aspect of the superior thyroid vessels and then these were  divided. This helped to mobilize the superior pole. We very carefully  dissected from medial to lateral. The middle thyroid vein was isolated and  then secured with small  metal clips and divided. The inferior thyroid artery  was isolated and secured with small metal clips and divided. A few of the  small venous channels were taken. We were very careful to keep the  dissection right in the thyroid capsule so as to not get too far lateral and  avoid injury to the recurrent laryngeal nerve. We did identify the region of  the recurrent laryngeal nerve and felt that we saw it far away from the  dissection. We carried the dissection up onto the trachea. We felt a tiny 2  mm nodule in the isthmus of the thyroid and so took the dissection all the  way over to the left of the trachea and clamped the isthmus at this point  removing the isthmus with the small nodule and then the large right thyroid  lobe.   The specimen was sent to the lab and Dr. Jimmy Picket felt that on frozen  section and imprint cytology this was consistent with a benign follicular  lesion or hyperplastic nodule. There was no evidence of malignancy.   We suture ligated the stump of the isthmus of the thyroid with suture   ligatures of 3-0 Vicryl.   We irrigated the wounds quite carefully. There was a tiny amount of oozing  down near the recurrent laryngeal nerve which we simply controlled with  Surgicel gauze and pressure and after 5-10 minutes it was completely dry and  no bleeding whatsoever even after irrigation. We left a small piece of  Surgicel gauze in place. The strap muscles were closed with a running suture  of 3-0 Vicryl. The platysmal muscle was closed with interrupted sutures of 3-  0 Vicryl. The skin was closed with a few skin staples and Steri-Strips.  Clean bandages were placed and the patient taken to the recovery room in  stable condition. Estimated blood loss was about 25 mL. Complications none.  Sponge, needle and instrument counts were correct.                                               Angelia Mould. Derrell Lolling, M.D.    HMI/MEDQ  D:  12/04/2002  T:  12/04/2002  Job:  [id number]   cc:   Brooke Bonito, M.D.  [address]  [place]  [place] [address]  Fax: [phone]   Osie Bond. Merilynn Finland, M.D.  [facility] - Resident - [place], [place] [address]  Fax: [phone]

## 2010-10-31 NOTE — Group Therapy Note (Signed)
[name], [name]:  [id number]   MEDICAL RECORD NO.:  [id number]          PATIENT TYPE:  WOC   LOCATION:  [facility]                   FACILITY:  [facility]   PHYSICIAN:  Dorthula Perfect, MD     DATE OF BIRTH:  1950/07/28   DATE OF SERVICE:  06/30/2006                                  CLINIC NOTE   [age]-year-old female, multigravida, is referred here because of an  episode of postmenopausal bleeding.  She states that she stopped having  her menstrual periods many years ago.  She does not remember when.  Suffice to say for the past few years she has had no vaginal bleeding.  In August of this year she had bleeding for two weeks, and has not had  any since that time.  She is followed in the [facility]  program because of high blood pressure, anxiety and being on thyroid  medication.  They obtained an ultrasound on her April 14, 2006, which  showed a uterus measuring 9.8 cm sagittally with a depth of 6.8 and a  width of 6.3 cm.  Several uterine fibroids were present.  The largest  fibroid was 4.7 x 4.9 x 3.5.  The endometrium measured 12.6 mm.  Neither  ovary was  visualized.  She presents today for endometrial biopsy.  She  is on no hormone medication, and has not been on a hormone medication  all these years.   The patient had a Pap smear and a mammogram a year ago, both of which  she said were normal.   PHYSICAL EXAMINATION:  Height 5 feet 6 inches, weight 208, blood  pressure 126/76.  ABDOMEN:  The abdomen is obese, soft and nontender.  No masses are felt.  PELVIC EXAMINATION:  External genitalia and BUS glands were normal.  Vaginal vault was negative.  The cervix was epithelialized and appeared  normal.  The cervical os appeared to be stenotic.  The uterus was  somewhat difficult to palpate because of her size, but I believe it was  of either normal to upper limits of normal size.  Ovaries were not  palpable.  There were no adnexal  masses noted.   DIAGNOSIS:  Postmenopausal bleeding.   DISPOSITION:  1. The endometrial biopsy was performed.  The cervix was grasped with      single-tooth tenaculum.  The uterine sound would not pass through      the stenotic cervix.  A #2 dilator was gently inserted followed by      a #3, and then by a #4.  The Pipelle-like device was then inserted      and the uterus sounded 10 cm.  A good amount of tissue was then      aspirated.  Bleeding was minimal.  2. The patient will return in 2 weeks for results.           ______________________________  Dorthula Perfect, MD     ER/MEDQ  D:  06/30/2006  T:  07/01/2006  Job:  [id number]

## 2010-12-19 ENCOUNTER — Other Ambulatory Visit: Payer: Self-pay | Admitting: Family Medicine

## 2010-12-19 ENCOUNTER — Encounter: Payer: Self-pay | Admitting: Family Medicine

## 2010-12-19 ENCOUNTER — Ambulatory Visit (INDEPENDENT_AMBULATORY_CARE_PROVIDER_SITE_OTHER): Payer: Managed Care, Other (non HMO) | Admitting: Family Medicine

## 2010-12-19 VITALS — BP 106/68 | HR 71 | Temp 98.5°F | Ht 66.5 in | Wt 204.6 lb

## 2010-12-19 DIAGNOSIS — E785 Hyperlipidemia, unspecified: Secondary | ICD-10-CM

## 2010-12-19 DIAGNOSIS — I10 Essential (primary) hypertension: Secondary | ICD-10-CM

## 2010-12-19 DIAGNOSIS — E039 Hypothyroidism, unspecified: Secondary | ICD-10-CM

## 2010-12-19 DIAGNOSIS — E119 Type 2 diabetes mellitus without complications: Secondary | ICD-10-CM

## 2010-12-19 DIAGNOSIS — E876 Hypokalemia: Secondary | ICD-10-CM

## 2010-12-19 LAB — BASIC METABOLIC PANEL
CO2: 28 mEq/L (ref 19–32)
Calcium: 9.5 mg/dL (ref 8.4–10.5)
Creat: 1.01 mg/dL (ref 0.50–1.10)
Glucose, Bld: 83 mg/dL (ref 70–99)
Sodium: 142 mEq/L (ref 135–145)

## 2010-12-19 MED ORDER — LISINOPRIL-HYDROCHLOROTHIAZIDE 20-25 MG PO TABS: 1.0000 | ORAL_TABLET | Freq: Every day | ORAL | Status: DC

## 2010-12-19 MED ORDER — SERTRALINE HCL 100 MG PO TABS: 100.0000 mg | ORAL_TABLET | Freq: Every day | ORAL | Status: DC

## 2010-12-19 MED ORDER — POTASSIUM CHLORIDE CRYS ER 20 MEQ PO TBCR: 20.0000 meq | EXTENDED_RELEASE_TABLET | Freq: Two times a day (BID) | ORAL | Status: DC

## 2010-12-19 MED ORDER — HYDROXYZINE HCL 25 MG PO TABS: 25.0000 mg | ORAL_TABLET | Freq: Four times a day (QID) | ORAL | Status: DC | PRN

## 2010-12-19 MED ORDER — METFORMIN HCL 500 MG PO TABS: 500.0000 mg | ORAL_TABLET | Freq: Two times a day (BID) | ORAL | Status: DC

## 2010-12-19 MED ORDER — LEVOTHYROXINE SODIUM 50 MCG PO TABS: 50.0000 ug | ORAL_TABLET | Freq: Every day | ORAL | Status: DC

## 2010-12-19 NOTE — Assessment & Plan Note (Signed)
Repeat BMET today. Refilled oral K taken daily.

## 2010-12-19 NOTE — Assessment & Plan Note (Signed)
At goal on ACEi/HCTZ. Continue currently. Repeat cr today. F/u in 3-6 months.

## 2010-12-19 NOTE — Assessment & Plan Note (Signed)
Improved with low dose metformin. Will increase to 500mg  BID and f/u A1c in 3-6 months. Repeat cr today. Ophthalmology referral for routine surveillance. On ACEi with HTN control. Discussed implications of diabetes including ophtho, renal, vascular risks. Emphasized controllable risk factors: weight, diet, exercise.

## 2010-12-19 NOTE — Assessment & Plan Note (Signed)
TSH wnl. No signs/symptoms of exacerbation. F/u in 9 months.

## 2010-12-19 NOTE — Assessment & Plan Note (Signed)
Lifestyle modifications and near goal LDL 107. Will repeat in 3-6 months. Consider statin with risk factors if uncontrolled.

## 2010-12-19 NOTE — Patient Instructions (Addendum)
Good to see you again. Good job on your A1C. It is down to 6.9. Try to exercise and work on your weight loss. We will call you with your eye doctor appointment. Increase your metformin to twice daily. I will call you if your labs are not normal.  Make an appointment in 3-4 months. Diabetes, Type 2 Diabetes is a lasting (chronic) disease. In type 2 diabetes, the pancreas does not make enough insulin (a hormone), and the body does not respond normally to the insulin that is made. This type of diabetes was also previously called adult onset diabetes. About 90% of all those who have diabetes have type 2. It usually occurs after the age of 63 but can occur at any age. CAUSES Unlike type 1 diabetes, which happens because insulin is no longer being made, type 2 diabetes happens because the body is making less insulin and has trouble using the insulin properly. SYMPTOMS  Drinking more than usual.   Urinating more than usual.   Blurred vision.   Dry, itchy skin.   Frequent infection like yeast infections in women.   More tired than usual (fatigue).  TREATMENT  Healthy eating.   Exercise.   Medication, if needed.   Monitoring blood glucose (sugar).   Seeing your caregiver regularly.  HOME CARE INSTRUCTIONS  Check your blood glucose (sugar) at least once daily. More frequent monitoring may be necessary, depending on your medications and on how well your diabetes is controlled. Your caregiver will advise you.   Take your medicine as directed by your caregiver.   Do not smoke.   Make wise food choices. Ask your caregiver for information. Weight loss can improve your diabetes.   Learn about low blood glucose (hypoglycemia) and how to treat it.   Get your eyes checked regularly.   Have a yearly physical exam. Have your blood pressure checked. Get your blood and urine tested.   Wear a pendant or bracelet saying that you have diabetes.   Check your feet every night for sores. Let  your caregiver know if you have sores that are not healing.  SEEK MEDICAL CARE IF:  You are having problems keeping your blood glucose at target range.   You feel you might be having problems with your medicines.   You have symptoms of an illness that is not improving after 24 hours.   You have a sore or wound that is not healing.   You notice a change in vision or a new problem with your vision.   You develop a fever of more than 101.  Document Released: 06/01/2005 Document Re-Released: 06/23/2009 [facility] Patient Information 2011 [place], [place].

## 2010-12-19 NOTE — Progress Notes (Signed)
  Subjective:    [name], female    DOB: 09/10/1950, 60 y.o.   MRN: [id number]  HPI  1. DM- New official diagnosis in 09/2010 with A1c 7.1. Started daily metformin 500mg  at that time. Has tolerated this well without side effects. Checked blood sugar one time, random value was 114. Denies any visual changes, skin infections, foot problems, numbness or tingling in feet. Has not been exercising regularly. Patient's mother had diabetes, so she knows a little about the complications, but no specifics about risk of stroke, MI, vascular disease, eye/renal manifestations. Spent time discussing risks and controllable risk factors including weight, exercise, diet.  2. Obesity. Has lost 8 lbs since last office visit. Recommended continuing lifestyle modification.   2. Health maintenance. Had normal colonoscopy several years ago. Normal pap in 2010. Mammograms yearly.   Review of Systems No polyuria, polydipsia, dysuria, fevers, skin breakdown, weight changes, cold/heat intolerance, palpitations.     Objective:   Physical Exam  Vitals reviewed. Constitutional: She is oriented to person, place, and time. She appears well-developed and well-nourished. No distress.  HENT:  Head: Normocephalic and atraumatic.  Mouth/Throat: Oropharynx is clear and moist.  Eyes: EOM are normal. Pupils are equal, round, and reactive to light.  Cardiovascular: Normal rate, regular rhythm and normal heart sounds.   No murmur heard. Pulmonary/Chest: Effort normal and breath sounds normal.  Musculoskeletal: Normal range of motion. She exhibits no edema and no tenderness.  Neurological: She is alert and oriented to person, place, and time. Coordination normal.  Skin: Skin is warm. No rash noted. No erythema.  Psychiatric: She has a normal mood and affect.          Assessment & Plan:

## 2010-12-22 ENCOUNTER — Telehealth: Payer: Self-pay | Admitting: Family Medicine

## 2010-12-22 NOTE — Telephone Encounter (Signed)
Called and lvm for pt to call back concerning her ophthalmology appt. Since pt has [employer] Ins. She will need to look on her list of providers to find out who will cover her.Laureen Ochs, Viann Shove '

## 2010-12-23 NOTE — Telephone Encounter (Signed)
Spoke with pt and told her to call or go onto the internet to find out which ophthalmologist will take her insurance and if they should need any additional information from our office to please call and let us know pt agreed.Candice Warner

## 2010-12-23 NOTE — Telephone Encounter (Signed)
Pt returned call - pls call her at [phone]

## 2010-12-23 NOTE — Telephone Encounter (Signed)
Called and lvm for pt to return call concerning the referral for ophthalmology.Candice Warner [facility]

## 2011-02-10 ENCOUNTER — Encounter: Payer: Self-pay | Admitting: *Deleted

## 2011-04-06 ENCOUNTER — Other Ambulatory Visit: Payer: Self-pay | Admitting: Family Medicine

## 2011-04-06 DIAGNOSIS — Z1231 Encounter for screening mammogram for malignant neoplasm of breast: Secondary | ICD-10-CM

## 2011-04-21 ENCOUNTER — Ambulatory Visit
Admission: RE | Admit: 2011-04-21 | Discharge: 2011-04-21 | Disposition: A | Discharge status: Home / Self Care | Payer: Managed Care, Other (non HMO) | Source: Ambulatory Visit | Attending: Family Medicine | Admitting: Family Medicine

## 2011-04-21 DIAGNOSIS — Z1231 Encounter for screening mammogram for malignant neoplasm of breast: Secondary | ICD-10-CM

## 2011-06-01 ENCOUNTER — Encounter: Payer: Self-pay | Admitting: Family Medicine

## 2011-06-01 ENCOUNTER — Ambulatory Visit (INDEPENDENT_AMBULATORY_CARE_PROVIDER_SITE_OTHER): Payer: Managed Care, Other (non HMO) | Admitting: Family Medicine

## 2011-06-01 VITALS — BP 123/60 | HR 59 | Ht 66.5 in | Wt 202.5 lb

## 2011-06-01 DIAGNOSIS — E119 Type 2 diabetes mellitus without complications: Secondary | ICD-10-CM

## 2011-06-01 DIAGNOSIS — M79609 Pain in unspecified limb: Secondary | ICD-10-CM

## 2011-06-01 DIAGNOSIS — M79604 Pain in right leg: Secondary | ICD-10-CM

## 2011-06-01 DIAGNOSIS — R252 Cramp and spasm: Secondary | ICD-10-CM

## 2011-06-01 DIAGNOSIS — M25551 Pain in right hip: Secondary | ICD-10-CM | POA: Insufficient documentation

## 2011-06-01 LAB — TSH: TSH: 2.359 u[IU]/mL (ref 0.350–4.500)

## 2011-06-01 MED ORDER — CYCLOBENZAPRINE HCL 5 MG PO TABS: 5.0000 mg | ORAL_TABLET | Freq: Every evening | ORAL | Status: AC | PRN

## 2011-06-01 NOTE — Assessment & Plan Note (Signed)
Uncertain if related to current back/leg pain. Will check electrolytes, B12 and TSH today.

## 2011-06-01 NOTE — Patient Instructions (Signed)
You are having back pain from arthritis and muscle spasm. Take flexeril only at night for sleep. Take tylenol up to 1000mg  during the day for sleep. Important to stay active, work on back strengthening exercises. If still having pain and not improved, make an appointment in 2-3 weeks.   Back Exercises Back exercises help treat and prevent back injuries. The goal of back exercises is to increase the strength of your abdominal and back muscles and the flexibility of your back. These exercises should be started when you no longer have back pain. Back exercises include:  Pelvic Tilt. Lie on your back with your knees bent. Tilt your pelvis until the lower part of your back is against the floor. Hold this position 5 to 10 sec and repeat 5 to 10 times.   Knee to Chest. Pull first 1 knee up against your chest and hold for 20 to 30 seconds, repeat this with the other knee, and then both knees. This may be done with the other leg straight or bent, whichever feels better.   Sit-Ups or Curl-Ups. Bend your knees 90 degrees. Start with tilting your pelvis, and do a partial, slow sit-up, lifting your trunk only 30 to 45 degrees off the floor. Take at least 2 to 3 seconds for each sit-up. Do not do sit-ups with your knees out straight. If partial sit-ups are difficult, simply do the above but with only tightening your abdominal muscles and holding it as directed.   Hip-Lift. Lie on your back with your knees flexed 90 degrees. Push down with your feet and shoulders as you raise your hips a couple inches off the floor; hold for 10 seconds, repeat 5 to 10 times.   Back arches. Lie on your stomach, propping yourself up on bent elbows. Slowly press on your hands, causing an arch in your low back. Repeat 3 to 5 times. Any initial stiffness and discomfort should lessen with repetition over time.   Shoulder-Lifts. Lie face down with arms beside your body. Keep hips and torso pressed to floor as you slowly lift your head  and shoulders off the floor.  Do not overdo your exercises, especially in the beginning. Exercises may cause you some mild back discomfort which lasts for a few minutes; however, if the pain is more severe, or lasts for more than 15 minutes, do not continue exercises until you see your caregiver. Improvement with exercise therapy for back problems is slow.  See your caregivers for assistance with developing a proper back exercise program. Document Released: 07/09/2004 Document Revised: 01/28/2011 Document Reviewed: 06/01/2005 [facility] Patient Information 2012 [place], [place].

## 2011-06-01 NOTE — Assessment & Plan Note (Signed)
Advised patient to schedule follow up in next month for A1c.

## 2011-06-01 NOTE — Progress Notes (Signed)
  Subjective:    Candice Warner is a 60 y.o. female who presents for evaluation of low back pain. The patient has had recurrent self limited episodes of low back pain in the past and similar symptoms as current but on the left side.. Symptoms have been present for 5 days and are stable.  Onset was related to / precipitated by no known injury. The pain is located in the right lumbar area, right sacroiliac area, right gluteal area or radiating to right leg(s) and stops at level of upper shin. The pain is described as stabbing, stiffness and cramping and occurs all day and worse at night with positions. She rates her pain as a 10 on a scale of 0-10. Symptoms are exacerbated by extension, flexion, sitting and hanging curtains yesterday.. Symptoms are improved by NSAIDs and unknown pain pill given by her sister. She has also tried sleep and stretching which provided no symptom relief. She has no other symptoms associated with the back pain. The patient has no "red flag" history indicative of complicated back pain such as weakness, numbness, incontinence.  The following portions of the patient's history were reviewed and updated as appropriate: allergies, current medications, past family history, past medical history, past social history, past surgical history and problem list.  Review of Systems A comprehensive review of systems was negative except for: Musculoskeletal: positive for diffuse muscle cramps in legs, stomach.    Objective:   Pain worsened with spinal extension and flexion, no tenderness, no spasm, no curvature. No change with lateral motion. Normal gait, strength and negative straight-leg raise. Inspection and palpation: inspection of back is normal, no tenderness noted, sacroiliac tenderness noted on right and dffuse right lumbar paraspinal musculature. Muscle tone and ROM exam: muscle spasm noted right quadricep. Hip ROM testing wnl, no pain elicited. No trochanteric pain. Straight leg  raise: negative at 50 degrees bilaterally. Neurological: normal DTRs, muscle strength and reflexes. Pain with right FABER.     Assessment:    Nonspecific acute low back pain Sciatica possibly due to degenerative joint disease at intervertebral facet joints, vs SI joint inflammation    Plan:    Natural history and expected course discussed. Questions answered. Agricultural engineer distributed. Stretching exercises discussed. Discussed trial of PT, but patient refuses due to cost. Regular aerobic and trunk strengthening exercises discussed. Short (2-4 day) period of relative rest recommended until acute symptoms improve. Muscle relaxants per medication orders. Follow-up in 2 weeks. Or sooner if symptoms persist or worsen.

## 2011-06-02 LAB — COMPREHENSIVE METABOLIC PANEL
ALT: 15 U/L (ref 0–35)
Alkaline Phosphatase: 60 U/L (ref 39–117)
Creat: 0.78 mg/dL (ref 0.50–1.10)
Glucose, Bld: 80 mg/dL (ref 70–99)
Sodium: 142 mEq/L (ref 135–145)
Total Bilirubin: 0.4 mg/dL (ref 0.3–1.2)
Total Protein: 7.4 g/dL (ref 6.0–8.3)

## 2011-06-05 ENCOUNTER — Other Ambulatory Visit: Payer: Self-pay | Admitting: Family Medicine

## 2011-06-05 DIAGNOSIS — G629 Polyneuropathy, unspecified: Secondary | ICD-10-CM

## 2011-06-05 DIAGNOSIS — R449 Unspecified symptoms and signs involving general sensations and perceptions: Secondary | ICD-10-CM

## 2011-06-25 ENCOUNTER — Telehealth: Payer: Self-pay | Admitting: Family Medicine

## 2011-06-25 NOTE — Telephone Encounter (Signed)
Called patient to discuss low B12 level. Request she make lab appt for confirmation test. She agrees to do so.

## 2011-07-01 ENCOUNTER — Other Ambulatory Visit: Payer: Managed Care, Other (non HMO)

## 2011-07-01 DIAGNOSIS — R449 Unspecified symptoms and signs involving general sensations and perceptions: Secondary | ICD-10-CM

## 2011-07-01 NOTE — Progress Notes (Signed)
Mma,homocsytine,rbc folate and cbc done today Annais Crafts

## 2011-07-02 LAB — HOMOCYSTEINE: Homocysteine: 13.9 umol/L (ref 4.0–15.4)

## 2011-07-02 LAB — CBC
HCT: 34.8 % — ABNORMAL LOW (ref 36.0–46.0)
Hemoglobin: 11.1 g/dL — ABNORMAL LOW (ref 12.0–15.0)
RBC: 3.89 MIL/uL (ref 3.87–5.11)
WBC: 4.6 10*3/uL (ref 4.0–10.5)

## 2011-07-02 LAB — FOLATE RBC: RBC Folate: 579 ng/mL (ref >=366)

## 2011-07-07 ENCOUNTER — Encounter: Payer: Self-pay | Admitting: Family Medicine

## 2011-08-06 ENCOUNTER — Encounter: Payer: Self-pay | Admitting: Family Medicine

## 2011-08-24 ENCOUNTER — Ambulatory Visit (INDEPENDENT_AMBULATORY_CARE_PROVIDER_SITE_OTHER): Payer: Managed Care, Other (non HMO) | Admitting: Family Medicine

## 2011-08-24 ENCOUNTER — Encounter: Payer: Self-pay | Admitting: Family Medicine

## 2011-08-24 VITALS — BP 100/61 | HR 81 | Temp 97.9°F | Ht 66.5 in | Wt 199.1 lb

## 2011-08-24 DIAGNOSIS — E785 Hyperlipidemia, unspecified: Secondary | ICD-10-CM

## 2011-08-24 DIAGNOSIS — M79604 Pain in right leg: Secondary | ICD-10-CM

## 2011-08-24 DIAGNOSIS — I10 Essential (primary) hypertension: Secondary | ICD-10-CM

## 2011-08-24 DIAGNOSIS — E7800 Pure hypercholesterolemia, unspecified: Secondary | ICD-10-CM | POA: Insufficient documentation

## 2011-08-24 DIAGNOSIS — E119 Type 2 diabetes mellitus without complications: Secondary | ICD-10-CM

## 2011-08-24 DIAGNOSIS — M79609 Pain in unspecified limb: Secondary | ICD-10-CM

## 2011-08-24 LAB — POCT GLYCOSYLATED HEMOGLOBIN (HGB A1C): Hemoglobin A1C: 6.5

## 2011-08-24 MED ORDER — SIMVASTATIN 10 MG PO TABS: 10.0000 mg | ORAL_TABLET | Freq: Every day | ORAL | Status: AC

## 2011-08-24 NOTE — Patient Instructions (Signed)
Great job on your diabetes! Will start low dose cholesterol medication. Try compression stockings and elevation of legs for pain. Make an appointment in 3-4 months for physical, or sooner if your leg pain is worsened.  Cholesterol Control Diet Cholesterol levels in your body are determined significantly by your diet. Cholesterol levels may also be related to heart disease. The following material helps to explain this relationship and discusses what you can do to help keep your heart healthy. Not all cholesterol is bad. Low-density lipoprotein (LDL) cholesterol is the "bad" cholesterol. It may cause fatty deposits to build up inside your arteries. High-density lipoprotein (HDL) cholesterol is "good." It helps to remove the "bad" LDL cholesterol from your blood. Cholesterol is a very important risk factor for heart disease. Other risk factors are high blood pressure, smoking, stress, heredity, and weight. The heart muscle gets its supply of blood through the coronary arteries. If your LDL cholesterol is high and your HDL cholesterol is low, you are at risk for having fatty deposits build up in your coronary arteries. This leaves less room through which blood can flow. Without sufficient blood and oxygen, the heart muscle cannot function properly and you may feel chest pains (angina pectoris). When a coronary artery closes up entirely, a part of the heart muscle may die, causing a heart attack (myocardial infarction). CHECKING CHOLESTEROL When your caregiver sends your blood to a lab to be analyzed for cholesterol, a complete lipid (fat) profile may be done. With this test, the total amount of cholesterol and levels of LDL and HDL are determined. Triglycerides are a type of fat that circulates in the blood and can also be used to determine heart disease risk. The list below describes what the numbers should be: Test: Total Cholesterol.  Less than 200 mg/dl.  Test: LDL "bad cholesterol."  Less than 100  mg/dl.   Less than 70 mg/dl if you are at very high risk of a heart attack or sudden cardiac death.  Test: HDL "good cholesterol."  Greater than 50 mg/dl for women.   Greater than 40 mg/dl for men.  Test: Triglycerides.  Less than 150 mg/dl.  CONTROLLING CHOLESTEROL WITH DIET Although exercise and lifestyle factors are important, your diet is key. That is because certain foods are known to raise cholesterol and others to lower it. The goal is to balance foods for their effect on cholesterol and more importantly, to replace saturated and trans fat with other types of fat, such as monounsaturated fat, polyunsaturated fat, and omega-3 fatty acids. On average, a person should consume no more than 15 to 17 g of saturated fat daily. Saturated and trans fats are considered "bad" fats, and they will raise LDL cholesterol. Saturated fats are primarily found in animal products such as meats, butter, and cream. However, that does not mean you need to sacrifice all your favorite foods. Today, there are good tasting, low-fat, low-cholesterol substitutes for most of the things you like to eat. Choose low-fat or nonfat alternatives. Choose round or loin cuts of red meat, since these types of cuts are lowest in fat and cholesterol. Chicken (without the skin), fish, veal, and ground [place] breast are excellent choices. Eliminate fatty meats, such as hot dogs and salami. Even shellfish have little or no saturated fat. Have a 3 oz (85 g) portion when you eat lean meat, poultry, or fish. Trans fats are also called "partially hydrogenated oils." They are oils that have been scientifically manipulated so that they are solid at  room temperature resulting in a longer shelf life and improved taste and texture of foods in which they are added. Trans fats are found in stick margarine, some tub margarines, cookies, crackers, and baked goods.  When baking and cooking, oils are an excellent substitute for butter. The  monounsaturated oils are especially beneficial since it is believed they lower LDL and raise HDL. The oils you should avoid entirely are saturated tropical oils, such as coconut and palm.  Remember to eat liberally from food groups that are naturally free of saturated and trans fat, including fish, fruit, vegetables, beans, grains (barley, rice, couscous, bulgur wheat), and pasta (without cream sauces).  IDENTIFYING FOODS THAT LOWER CHOLESTEROL  Soluble fiber may lower your cholesterol. This type of fiber is found in fruits such as apples, vegetables such as broccoli, potatoes, and carrots, legumes such as beans, peas, and lentils, and grains such as barley. Foods fortified with plant sterols (phytosterol) may also lower cholesterol. You should eat at least 2 g per day of these foods for a cholesterol lowering effect.  Read package labels to identify low-saturated fats, trans fats free, and low-fat foods at the supermarket. Select cheeses that have only 2 to 3 g saturated fat per ounce. Use a heart-healthy tub margarine that is free of trans fats or partially hydrogenated oil. When buying baked goods (cookies, crackers), avoid partially hydrogenated oils. Breads and muffins should be made from whole grains (whole-wheat or whole oat flour, instead of "flour" or "enriched flour"). Buy non-creamy canned soups with reduced salt and no added fats.  FOOD PREPARATION TECHNIQUES  Never deep-fry. If you must fry, either stir-fry, which uses very little fat, or use non-stick cooking sprays. When possible, broil, bake, or roast meats, and steam vegetables. Instead of dressing vegetables with butter or margarine, use lemon and herbs, applesauce and cinnamon (for squash and sweet potatoes), nonfat yogurt, salsa, and low-fat dressings for salads.  LOW-SATURATED FAT / LOW-FAT FOOD SUBSTITUTES Meats / Saturated Fat (g)  Avoid: Steak, marbled (3 oz/85 g) / 11 g   Choose: Steak, lean (3 oz/85 g) / 4 g   Avoid: Hamburger  (3 oz/85 g) / 7 g   Choose: Hamburger, lean (3 oz/85 g) / 5 g   Avoid: Ham (3 oz/85 g) / 6 g   Choose: Ham, lean cut (3 oz/85 g) / 2.4 g   Avoid: Chicken, with skin, dark meat (3 oz/85 g) / 4 g   Choose: Chicken, skin removed, dark meat (3 oz/85 g) / 2 g   Avoid: Chicken, with skin, light meat (3 oz/85 g) / 2.5 g   Choose: Chicken, skin removed, light meat (3 oz/85 g) / 1 g  Dairy / Saturated Fat (g)  Avoid: Whole milk (1 cup) / 5 g   Choose: Low-fat milk, 2% (1 cup) / 3 g   Choose: Low-fat milk, 1% (1 cup) / 1.5 g   Choose: Skim milk (1 cup) / 0.3 g   Avoid: Hard cheese (1 oz/28 g) / 6 g   Choose: Skim milk cheese (1 oz/28 g) / 2 to 3 g   Avoid: Cottage cheese, 4% fat (1 cup) / 6.5 g   Choose: Low-fat cottage cheese, 1% fat (1 cup) / 1.5 g   Avoid: Ice cream (1 cup) / 9 g   Choose: Sherbet (1 cup) / 2.5 g   Choose: Nonfat frozen yogurt (1 cup) / 0.3 g   Choose: Frozen fruit bar / trace   Avoid:  Whipped cream (1 tbs) / 3.5 g   Choose: Nondairy whipped topping (1 tbs) / 1 g  Condiments / Saturated Fat (g)  Avoid: Mayonnaise (1 tbs) / 2 g   Choose: Low-fat mayonnaise (1 tbs) / 1 g   Avoid: Butter (1 tbs) / 7 g   Choose: Extra light margarine (1 tbs) / 1 g   Avoid: Coconut oil (1 tbs) / 11.8 g   Choose: Olive oil (1 tbs) / 1.8 g   Choose: Corn oil (1 tbs) / 1.7 g   Choose: Safflower oil (1 tbs) / 1.2 g   Choose: Sunflower oil (1 tbs) / 1.4 g   Choose: Soybean oil (1 tbs) / 2.4 g   Choose: Canola oil (1 tbs) / 1 g  Document Released: 06/01/2005 Document Revised: 05/21/2011 Document Reviewed: 11/20/2010 [facility] Patient Information 2012 [place], [place].

## 2011-08-25 NOTE — Assessment & Plan Note (Signed)
Improved control. Continue metformin monotherapy, ACEi, statin, daily aspirin. Patient refuses podiatry referral and follows yearly with ophtho. F/u in 4 months.

## 2011-08-25 NOTE — Assessment & Plan Note (Signed)
LDL borderline. Will start statin low dose in setting of DM and follow up FLP at next visit.

## 2011-08-25 NOTE — Assessment & Plan Note (Signed)
Seems related to varicose vein. No signs phlebitis or VTE. Recommend trial of compression stockings and elevation to improve venous congestion. Follow up if worsens or developed associated symptoms.

## 2011-08-25 NOTE — Progress Notes (Signed)
  Subjective:    [name], female    DOB: [birth year], 61 y.o.   MRN: [id number]  HPI  1. DM. On metformin BID, tolerating well. Not checking blood sugar. No polyuria, polydipsia, fatigue, numbness or tingling.  2. Right leg pain. Located on lateral tibia, area of varicosity. Describes as leg cramping at night. Positional in nature and resolves when not lying on leg.  No swelling, calf pain, fevers, trauma, weakness, numbness.  Some low back pain but mild, not radiating to leg.  3. HTN. No problems with medication. Does not check at home. Denies syncope, edema, change in urinary patterns. Nonsmoker.   4. Depression. On zoloft. No problems with med and is attached to this. Had recurrence of depression off therapy. No exacerbations.  Review of Systems See HPI otherwise negative.     Objective:   Physical Exam  Vitals reviewed. Constitutional: She is oriented to person, place, and time. She appears well-developed and well-nourished. No distress.  HENT:  Head: Normocephalic and atraumatic.  Mouth/Throat: Oropharynx is clear and moist.  Eyes: EOM are normal.  Neck: No thyromegaly present.  Cardiovascular: Normal rate, regular rhythm, normal heart sounds and intact distal pulses.   No murmur heard. Pulmonary/Chest: Effort normal and breath sounds normal. No respiratory distress. She has no wheezes. She has no rales.  Abdominal: Soft.  Musculoskeletal: She exhibits no edema.       Right leg varicosity/dilated vein in peroneal region in area of soreness. No warmth, redness. No leg edema or calf tenderness. No TTP in bony aspects or knee effusion.  Sensation to light touch intact bilateral feet. Long thickened toenails, no ulceration or callous.  Neurological: She is alert and oriented to person, place, and time. No cranial nerve deficit. Coordination normal.  Skin: No rash noted.  Psychiatric: She has a normal mood and affect.       Assessment & Plan:

## 2011-08-25 NOTE — Assessment & Plan Note (Signed)
Controlled, well below goal. Pt is asymptomatic but if remains lower end, may decrease medication dosage to half. Labs in 4-6 months.

## 2011-08-26 ENCOUNTER — Telehealth: Payer: Self-pay | Admitting: Family Medicine

## 2011-08-26 ENCOUNTER — Other Ambulatory Visit: Payer: Self-pay | Admitting: Family Medicine

## 2011-08-26 MED ORDER — SERTRALINE HCL 100 MG PO TABS: 100.0000 mg | ORAL_TABLET | Freq: Every day | ORAL | Status: DC

## 2011-08-26 NOTE — Telephone Encounter (Signed)
Refill request

## 2011-09-07 ENCOUNTER — Encounter: Payer: Self-pay | Admitting: Emergency Medicine

## 2011-09-07 ENCOUNTER — Ambulatory Visit (INDEPENDENT_AMBULATORY_CARE_PROVIDER_SITE_OTHER): Payer: Managed Care, Other (non HMO) | Admitting: Emergency Medicine

## 2011-09-07 VITALS — BP 124/77 | HR 57 | Temp 98.1°F | Ht 66.5 in | Wt 198.8 lb

## 2011-09-07 DIAGNOSIS — R42 Dizziness and giddiness: Secondary | ICD-10-CM

## 2011-09-07 MED ORDER — IBUPROFEN 800 MG PO TABS: 800.0000 mg | ORAL_TABLET | Freq: Three times a day (TID) | ORAL | Status: AC | PRN

## 2011-09-07 NOTE — Assessment & Plan Note (Signed)
Isolated incident in the setting of increased stress and decreased fluid intake.  Likely secondary to vaso-vagal vs dehydration.  Encouraged adequate fluid intake.  Discussed returning if starts having recurrent episodes of dizziness or chest pain.

## 2011-09-07 NOTE — Progress Notes (Signed)
  Subjective:    [name], female    DOB: Apr 21, 1951, 61 y.o.   MRN: [id number]  HPI JOHANNAH ROZAS is here for a SDA for dizziness and syncope.  1.  Patient describes an episode of dizziness progressing to syncope on Saturday.  It started with some epigastric/substernal CP, shortness of breath and nausea.  When she went to the bathroom, she became dizzy and her friend states with passed out.  Once she awoke, she vomiting once but then returned to normal.  No diaphoresis or flushing. Denies vertigo, unilateral weakness.  Paramedics were called and said she had normal blood pressure and blood sugar; she declined going to the hospital.  Has never had this before.  Since Saturday, she will feel a little dizzy when she first gets up in the morning, but it self-resolves within minutes.  Has had increased stress at work and decreased fluid intake for the last several days.  I have reviewed and updated the following as appropriate: allergies, current medications and past medical history  SHx: non-smoker  Review of Systems See HPI    Objective:   Physical Exam BP 124/77  Pulse 57  Temp(Src) 98.1 F (36.7 C) (Oral)  Ht 5' 6.5" (1.689 m)  Wt 198 lb 12.8 oz (90.175 kg)  BMI 31.61 kg/m2 Gen: alert, cooperative, NAD HEENT: AT/[place], sclera white, EOMI, PERRL, no nystagmus on extremes of gaze, no pharyngeal erythema or exudate, TMs normal bilaterally CV: RRR, no murmurs Pulm: CTAB Neuro: no focal deficits Other: Dix-Hallpike negative, orthostatic vitals negative      Assessment & Plan:

## 2011-09-07 NOTE — Patient Instructions (Addendum)
It was nice to meet you!  For the dizziness, this is probably a combination of stress and being a little dehydrated.   -please stay hydrated!  Drink lots of water.  If the dizziness starts happening more often, please come back in for further work up.  Your last HbA1c (for diabetes) was 6.5% Your blood pressure today was 126/73.

## 2011-09-14 NOTE — Telephone Encounter (Signed)
Leg cramping began two days after starting simvastatin. Patient has discontinued med and legs returned to normal. We will discuss other alternatives at next office visit. PATP.

## 2011-09-14 NOTE — Telephone Encounter (Signed)
Patient would like to speak to Dr. Cristal Ford about her Simvastin, it is causing her to have cramps in her legs.

## 2011-09-23 ENCOUNTER — Other Ambulatory Visit: Payer: Self-pay | Admitting: Family Medicine

## 2011-11-30 ENCOUNTER — Ambulatory Visit (INDEPENDENT_AMBULATORY_CARE_PROVIDER_SITE_OTHER): Payer: Managed Care, Other (non HMO) | Admitting: Family Medicine

## 2011-11-30 ENCOUNTER — Other Ambulatory Visit (HOSPITAL_COMMUNITY)
Admission: RE | Admit: 2011-11-30 | Discharge: 2011-11-30 | Disposition: A | Discharge status: Home / Self Care | Payer: Managed Care, Other (non HMO) | Source: Ambulatory Visit | Attending: Family Medicine | Admitting: Family Medicine

## 2011-11-30 ENCOUNTER — Encounter: Payer: Self-pay | Admitting: Family Medicine

## 2011-11-30 VITALS — BP 122/74 | HR 85 | Temp 99.0°F | Ht 66.6 in | Wt 199.0 lb

## 2011-11-30 DIAGNOSIS — M719 Bursopathy, unspecified: Secondary | ICD-10-CM

## 2011-11-30 DIAGNOSIS — E785 Hyperlipidemia, unspecified: Secondary | ICD-10-CM

## 2011-11-30 DIAGNOSIS — Z23 Encounter for immunization: Secondary | ICD-10-CM

## 2011-11-30 DIAGNOSIS — E669 Obesity, unspecified: Secondary | ICD-10-CM

## 2011-11-30 DIAGNOSIS — Z124 Encounter for screening for malignant neoplasm of cervix: Secondary | ICD-10-CM

## 2011-11-30 DIAGNOSIS — M67919 Unspecified disorder of synovium and tendon, unspecified shoulder: Secondary | ICD-10-CM | POA: Insufficient documentation

## 2011-11-30 DIAGNOSIS — I10 Essential (primary) hypertension: Secondary | ICD-10-CM

## 2011-11-30 DIAGNOSIS — R252 Cramp and spasm: Secondary | ICD-10-CM

## 2011-11-30 DIAGNOSIS — M25519 Pain in unspecified shoulder: Secondary | ICD-10-CM | POA: Insufficient documentation

## 2011-11-30 DIAGNOSIS — Z0000 Encounter for general adult medical examination without abnormal findings: Secondary | ICD-10-CM

## 2011-11-30 DIAGNOSIS — Z01419 Encounter for gynecological examination (general) (routine) without abnormal findings: Secondary | ICD-10-CM | POA: Insufficient documentation

## 2011-11-30 DIAGNOSIS — E119 Type 2 diabetes mellitus without complications: Secondary | ICD-10-CM

## 2011-11-30 LAB — LIPID PANEL
Cholesterol: 190 mg/dL (ref 0–200)
Total CHOL/HDL Ratio: 3.8 Ratio
Triglycerides: 189 mg/dL — ABNORMAL HIGH (ref <150)
VLDL: 38 mg/dL (ref 0–40)

## 2011-11-30 LAB — POCT URINALYSIS DIPSTICK
Protein, UA: NEGATIVE
Spec Grav, UA: 1.025
Urobilinogen, UA: 0.2
pH, UA: 5.5

## 2011-11-30 LAB — HIV ANTIBODY (ROUTINE TESTING W REFLEX): HIV: NONREACTIVE

## 2011-11-30 LAB — POCT UA - MICROSCOPIC ONLY

## 2011-11-30 MED ORDER — IBUPROFEN 600 MG PO TABS: 600.0000 mg | ORAL_TABLET | Freq: Two times a day (BID) | ORAL | Status: AC | PRN

## 2011-11-30 NOTE — Assessment & Plan Note (Addendum)
Good control on metformin. Continue lisinopril, aspirin, statin. Due for eye exam, lipids.

## 2011-11-30 NOTE — Addendum Note (Signed)
Addended by: Deno Etienne on: 11/30/2011 11:43 AM   Modules accepted: Orders

## 2011-11-30 NOTE — Assessment & Plan Note (Signed)
These are nocturnal, nonexertional. Most likely idiopathic. Advised patient to maintain her hydration and followup if worsening.

## 2011-11-30 NOTE — Progress Notes (Signed)
  Subjective:    [name], female    DOB: 11-10-1950, 61 y.o.   MRN: [id number]  HPI CPE  1. Right shoulder pain. Patient states she is having some right shoulder pain, worse at night for the past several months. Feels like an aching in the anterior and lateral radiating down her arm. She denies any injury, swelling, rash, warm, weakness, numbness. Has been using Tylenol and Motrin which helped mildly. Heat and massage help. She works in [employer], does not endorse repetitive motion.  2. Right leg pain. She relates this to sleeping on her right side also. Does not have pain with walking, exertion or during the day. She feels cramping intermittently at nighttime.  3. DM. Taking metformin, ACE inhibitor. Presents with a letter from her insurance company requesting lab work. Patient is due for lipid panel and A1c. On daily aspirin. Last eye exam was greater than one year ago.  Review of Systems See HPI otherwise negative.  reports that she has never smoked. She does not have any smokeless tobacco history on file.    Objective:   Physical Exam  Vitals reviewed. Constitutional: She is oriented to person, place, and time. She appears well-developed and well-nourished. No distress.  HENT:  Head: Normocephalic and atraumatic.  Mouth/Throat: Oropharynx is clear and moist.  Eyes: EOM are normal. Pupils are equal, round, and reactive to light.  Neck: Normal range of motion. Neck supple. No thyromegaly present.       No neck pain. Protrusion of left sternoclavicular joint-nontender, no warmth or redness.  Cardiovascular: Normal rate, regular rhythm and normal heart sounds.   Pulmonary/Chest: Effort normal and breath sounds normal. No respiratory distress. She has no wheezes. She has no rales.       Breast exam normal.  Abdominal: Soft.  Genitourinary: Vagina normal and uterus normal. No vaginal discharge found.  Musculoskeletal: She exhibits no edema.       Discomfort with  Neer's, Hawking, empty can test. No weakness, swelling, numbness.  Lymphadenopathy:    She has no cervical adenopathy.  Neurological: She is alert and oriented to person, place, and time. No cranial nerve deficit.  Skin: No rash noted. She is not diaphoretic.  Psychiatric: She has a normal mood and affect.          Assessment & Plan:

## 2011-11-30 NOTE — Assessment & Plan Note (Signed)
Recommend starting daily exercise routine at least 30 minutes.

## 2011-11-30 NOTE — Addendum Note (Signed)
Addended by: [place], Tori Dattilio on: 11/30/2011 02:45 PM   Modules accepted: Orders

## 2011-11-30 NOTE — Patient Instructions (Addendum)
I will send a copy of your results. Start doing exercises and stretching with your arms. GOod to start daily exercise such as walking or swimming. Have your xray to evaluate your bones.  Shoulder Exercises EXERCISES  RANGE OF MOTION (ROM) AND STRETCHING EXERCISES These exercises may help you when beginning to rehabilitate your injury. Your symptoms may resolve with or without further involvement from your physician, physical therapist or athletic trainer. While completing these exercises, remember:   Restoring tissue flexibility helps normal motion to return to the joints. This allows healthier, less painful movement and activity.   An effective stretch should be held for at least 30 seconds.   A stretch should never be painful. You should only feel a gentle lengthening or release in the stretched tissue.  ROM - Pendulum  Bend at the waist so that your right / left arm falls away from your body. Support yourself with your opposite hand on a solid surface, such as a table or a countertop.   Your right / left arm should be perpendicular to the ground. If it is not perpendicular, you need to lean over farther. Relax the muscles in your right / left arm and shoulder as much as possible.   Gently sway your hips and trunk so they move your right / left arm without any use of your right / left shoulder muscles.   Progress your movements so that your right / left arm moves side to side, then forward and backward, and finally, both clockwise and counterclockwise.   Complete __________ repetitions in each direction. Many people use this exercise to relieve discomfort in their shoulder as well as to gain range of motion.  Repeat __________ times. Complete this exercise __________ times per day. STRETCH - Flexion, Standing  Stand with good posture. With an underhand grip on your right / left hand and an overhand grip on the opposite hand, grasp a broomstick or cane so that your hands are a little  more than shoulder-width apart.   Keeping your right / left elbow straight and shoulder muscles relaxed, push the stick with your opposite hand to raise your right / left arm in front of your body and then overhead. Raise your arm until you feel a stretch in your right / left shoulder, but before you have increased shoulder pain.   Try to avoid shrugging your right / left shoulder as your arm rises by keeping your shoulder blade tucked down and toward your mid-back spine. Hold __________ seconds.   Slowly return to the starting position.  Repeat __________ times. Complete this exercise __________ times per day. STRETCH - Internal Rotation  Place your right / left hand behind your back, palm-up.   Throw a towel or belt over your opposite shoulder. Grasp the towel/belt with your right / left hand.   While keeping an upright posture, gently pull up on the towel/belt until you feel a stretch in the front of your right / left shoulder.   Avoid shrugging your right / left shoulder as your arm rises by keeping your shoulder blade tucked down and toward your mid-back spine.   Hold __________. Release the stretch by lowering your opposite hand.  Repeat __________ times. Complete this exercise __________ times per day. STRETCH - External Rotation and Abduction  Stagger your stance through a doorframe. It does not matter which foot is forward.   As instructed by your physician, physical therapist or athletic trainer, place your hands:   And forearms above  your head and on the door frame.   And forearms at head-height and on the door frame.   At elbow-height and on the door frame.   Keeping your head and chest upright and your stomach muscles tight to prevent over-extending your low-back, slowly shift your weight onto your front foot until you feel a stretch across your chest and/or in the front of your shoulders.   Hold __________ seconds. Shift your weight to your back foot to release the  stretch.  Repeat __________ times. Complete this stretch __________ times per day.  STRENGTHENING EXERCISES  These exercises may help you when beginning to rehabilitate your injury. They may resolve your symptoms with or without further involvement from your physician, physical therapist or athletic trainer. While completing these exercises, remember:   Muscles can gain both the endurance and the strength needed for everyday activities through controlled exercises.   Complete these exercises as instructed by your physician, physical therapist or athletic trainer. Progress the resistance and repetitions only as guided.   You may experience muscle soreness or fatigue, but the pain or discomfort you are trying to eliminate should never worsen during these exercises. If this pain does worsen, stop and make certain you are following the directions exactly. If the pain is still present after adjustments, discontinue the exercise until you can discuss the trouble with your clinician.   If advised by your physician, during your recovery, avoid activity or exercises which involve actions that place your right / left hand or elbow above your head or behind your back or head. These positions stress the tissues which are trying to heal.  STRENGTH - Scapular Depression and Adduction  With good posture, sit on a firm chair. Supported your arms in front of you with pillows, arm rests or a table top. Have your elbows in line with the sides of your body.   Gently draw your shoulder blades down and toward your mid-back spine. Gradually increase the tension without tensing the muscles along the top of your shoulders and the back of your neck.   Hold for __________ seconds. Slowly release the tension and relax your muscles completely before completing the next repetition.   After you have practiced this exercise, remove the arm support and complete it in standing as well as sitting.  Repeat __________ times.  Complete this exercise __________ times per day.  STRENGTH - External Rotators  Secure a rubber exercise band/tubing to a fixed object so that it is at the same height as your right / left elbow when you are standing or sitting on a firm surface.   Stand or sit so that the secured exercise band/tubing is at your side that is not injured.   Bend your elbow 90 degrees. Place a folded towel or small pillow under your right / left arm so that your elbow is a few inches away from your side.   Keeping the tension on the exercise band/tubing, pull it away from your body, as if pivoting on your elbow. Be sure to keep your body steady so that the movement is only coming from your shoulder rotating.   Hold __________ seconds. Release the tension in a controlled manner as you return to the starting position.  Repeat __________ times. Complete this exercise __________ times per day.  STRENGTH - Supraspinatus  Stand or sit with good posture. Grasp a __________ weight or an exercise band/tubing so that your hand is "thumbs-up," like when you shake hands.   Slowly  lift your right / left hand from your thigh into the air, traveling about 30 degrees from straight out at your side. Lift your hand to shoulder height or as far as you can without increasing any shoulder pain. Initially, many people do not lift their hands above shoulder height.   Avoid shrugging your right / left shoulder as your arm rises by keeping your shoulder blade tucked down and toward your mid-back spine.   Hold for __________ seconds. Control the descent of your hand as you slowly return to your starting position.  Repeat __________ times. Complete this exercise __________ times per day.  STRENGTH - Shoulder Extensors  Secure a rubber exercise band/tubing so that it is at the height of your shoulders when you are either standing or sitting on a firm arm-less chair.   With a thumbs-up grip, grasp an end of the band/tubing in each hand.  Straighten your elbows and lift your hands straight in front of you at shoulder height. Step back away from the secured end of band/tubing until it becomes tense.   Squeezing your shoulder blades together, pull your hands down to the sides of your thighs. Do not allow your hands to go behind you.   Hold for __________ seconds. Slowly ease the tension on the band/tubing as you reverse the directions and return to the starting position.  Repeat __________ times. Complete this exercise __________ times per day.  STRENGTH - Scapular Retractors  Secure a rubber exercise band/tubing so that it is at the height of your shoulders when you are either standing or sitting on a firm arm-less chair.   With a palm-down grip, grasp an end of the band/tubing in each hand. Straighten your elbows and lift your hands straight in front of you at shoulder height. Step back away from the secured end of band/tubing until it becomes tense.   Squeezing your shoulder blades together, draw your elbows back as you bend them. Keep your upper arm lifted away from your body throughout the exercise.   Hold __________ seconds. Slowly ease the tension on the band/tubing as you reverse the directions and return to the starting position.  Repeat __________ times. Complete this exercise __________ times per day. STRENGTH - Scapular Depressors  Find a sturdy chair without wheels, such as a from a dining room table.   Keeping your feet on the floor, lift your bottom from the seat and lock your elbows.   Keeping your elbows straight, allow gravity to pull your body weight down. Your shoulders will rise toward your ears.   Raise your body against gravity by drawing your shoulder blades down your back, shortening the distance between your shoulders and ears. Although your feet should always maintain contact with the floor, your feet should progressively support less body weight as you get stronger.   Hold __________ seconds. In a  controlled and slow manner, lower your body weight to begin the next repetition.  Repeat __________ times. Complete this exercise __________ times per day.  Document Released: 04/15/2005 Document Revised: 05/21/2011 Document Reviewed: 09/13/2008 [facility] Patient Information 2012 [place], [place].

## 2011-11-30 NOTE — Assessment & Plan Note (Signed)
At goal. Repeat labs in 6 months. 

## 2011-11-30 NOTE — Assessment & Plan Note (Signed)
Nontraumatic hypertrophy on left [place] joint > 1year. Pain only with reaching, nontender on exam. Will check plain film.

## 2011-11-30 NOTE — Addendum Note (Signed)
Addended by: Durwin Reges on: 11/30/2011 10:26 AM   Modules accepted: Orders

## 2011-11-30 NOTE — Assessment & Plan Note (Signed)
Some tendonopathy, no weakness or concern for tear. Will start with stretch and strengthening exercises TID. Patient instructed on maneuvers. F/u in 2-3 weeks. May refer to Sports medicine.

## 2011-12-02 ENCOUNTER — Encounter (HOSPITAL_BASED_OUTPATIENT_CLINIC_OR_DEPARTMENT_OTHER): Payer: Self-pay | Admitting: Family Medicine

## 2011-12-15 ENCOUNTER — Encounter: Payer: Self-pay | Admitting: Family Medicine

## 2011-12-15 ENCOUNTER — Telehealth: Payer: Self-pay | Admitting: Family Medicine

## 2011-12-15 NOTE — Telephone Encounter (Signed)
Pt was supposed to get xray for shoulder and hasn't heard anything.  Also asking about the colonoscopy referral

## 2011-12-15 NOTE — Telephone Encounter (Signed)
Spoke with patient and informed her that the x-ray ordered on 6/17 is on a walk in basis. She stated that she would go to [facility] to get this done. She said that it was not time for her colonoscopy at this time. Also I mailed a copy of labs from 6/17 also per patient's request.

## 2012-01-04 ENCOUNTER — Ambulatory Visit
Admission: RE | Admit: 2012-01-04 | Discharge: 2012-01-04 | Disposition: A | Discharge status: Home / Self Care | Payer: Managed Care, Other (non HMO) | Source: Ambulatory Visit | Attending: Family Medicine | Admitting: Family Medicine

## 2012-01-04 DIAGNOSIS — M25519 Pain in unspecified shoulder: Secondary | ICD-10-CM

## 2012-01-11 ENCOUNTER — Encounter: Payer: Self-pay | Admitting: Family Medicine

## 2012-01-11 ENCOUNTER — Ambulatory Visit (INDEPENDENT_AMBULATORY_CARE_PROVIDER_SITE_OTHER): Payer: Managed Care, Other (non HMO) | Admitting: Family Medicine

## 2012-01-11 VITALS — BP 112/60 | HR 70 | Temp 97.9°F | Ht 66.5 in | Wt 204.1 lb

## 2012-01-11 DIAGNOSIS — M67919 Unspecified disorder of synovium and tendon, unspecified shoulder: Secondary | ICD-10-CM

## 2012-01-11 NOTE — Patient Instructions (Addendum)
Your rotator cuff is causing pain. Best therapy is physical therapy and exercise. Will try steroid injection and therapy. Please make appointment with sports medicine to follow up.  Rotator Cuff Tendonitis  The rotator cuff is the collection of all the muscles and tendons (the supraspinatus, infraspinatus, subscapularis, and teres minor muscles and their tendons) that help your shoulder stay in place. This unit holds the head of the upper arm bone (humerus) in the cup (fossa) of the shoulder blade (scapula). Basically, it connects the arm to the shoulder. Tendinitis is a swelling and irritation of the tissue, called cord like structures (tendons) that connect muscle to bone. It usually is caused by overusing the joint involved. When the tissue surrounding a tendon (the synovium) becomes inflamed, it is called tenosynovitis. This also is often the result of overuse in people whose jobs require repetitive (over and over again) types of motion. HOME CARE INSTRUCTIONS   Use a sling or splint for as long as directed by your caregiver until the pain decreases.   Apply ice to the injury for 15 to 20 minutes, 3 to 4 times per day. Put the ice in a plastic bag and place a towel between the bag of ice and your skin.   Try to avoid use other than gentle range of motion while your shoulder is painful. Use and exercise only as directed by your caregiver. Stop exercises or range of motion if pain or discomfort increases, unless directed otherwise by your caregiver.   Only take over-the-counter or prescription medicines for pain, discomfort, or fever as directed by your caregiver.   If you were give a shoulder sling and straps (immobilizer), do not remove it except as directed, or until you see a caregiver for a follow-up examination. If you need to remove it, move your arm as little as possible or as directed.   You may want to sleep on several pillows at night to lessen swelling and pain.  SEEK IMMEDIATE  MEDICAL CARE IF:   Pain in your shoulder increases or new pain develops in your arm, hand, or fingers and is not relieved with medications.   You develop new, unexplained symptoms, especially increased numbness in the hands or loss of strength, or you develop any worsening of the problems which brought you in for care.   Your arm, hand, or fingers are numb or tingling.   Your arm, hand, or fingers are swollen, painful, or turn white or blue.  Document Released: 08/22/2003 Document Revised: 05/21/2011 Document Reviewed: 03/29/2008 [facility] Patient Information 2012 [place], [place].

## 2012-01-11 NOTE — Assessment & Plan Note (Addendum)
Stable right rotator cuff tendinopathy. Did not respond to home exercises. Subacromial injection today. Refer to PT for more dedicated therapy. Advised patient to f/u with Sports medicine in 2-4 weeks.  Risks and benefits discussed, written consent obtained. Cleaned with betadine and alcohol. A steroid injection was performed at right subacromial space using 1% plain Lidocaine and 40 mg of depo medrol. This was well tolerated.

## 2012-01-11 NOTE — Progress Notes (Signed)
  Subjective:    [name], female    DOB: 1951-04-01, 61 y.o.   MRN: [id number]  HPI  1. Shoulder pain. Right shoulder pain greater than one year. Stable symptoms despite home strengthening exercises no improvement noted. Pain worse at nighttime. Using prn Motrin. Has pain with overhead movement and rotational activities. Denies any weakness, numbness. No recent trauma or injury. Has history of left rotator cuff repair.  Review of Systems See HPI otherwise negative.  reports that she has never smoked. She does not have any smokeless tobacco history on file.    Objective:   Physical Exam  Vitals reviewed. Constitutional: She is oriented to person, place, and time. She appears well-developed and well-nourished. No distress.  HENT:  Head: Normocephalic and atraumatic.  Mouth/Throat: Oropharynx is clear and moist.  Eyes: EOM are normal.  Neck: Normal range of motion. Neck supple.  Pulmonary/Chest: Effort normal.  Musculoskeletal: She exhibits tenderness. She exhibits no edema.       Right supraspinatus and lateral shoulder tender to palpation. No edema or warmth. Active range of motion intact. Pain with lift off test, empty can test, Hawkin sign, Neers sign. No weakness. Sensation intact distally.   Neurological: She is alert and oriented to person, place, and time. Coordination normal.  Skin: No rash noted.  Psychiatric: She has a normal mood and affect. Her behavior is normal. Thought content normal.       Assessment & Plan:

## 2012-01-13 ENCOUNTER — Other Ambulatory Visit: Payer: Self-pay | Admitting: Family Medicine

## 2012-01-17 ENCOUNTER — Other Ambulatory Visit: Payer: Self-pay | Admitting: Family Medicine

## 2012-01-20 ENCOUNTER — Ambulatory Visit: Payer: Managed Care, Other (non HMO) | Admitting: Family Medicine

## 2012-01-27 ENCOUNTER — Ambulatory Visit: Payer: Managed Care, Other (non HMO) | Admitting: Family Medicine

## 2012-02-23 ENCOUNTER — Other Ambulatory Visit: Payer: Self-pay | Admitting: Family Medicine

## 2012-04-05 ENCOUNTER — Other Ambulatory Visit: Payer: Self-pay | Admitting: Family Medicine

## 2012-04-05 DIAGNOSIS — Z1231 Encounter for screening mammogram for malignant neoplasm of breast: Secondary | ICD-10-CM

## 2012-04-27 ENCOUNTER — Other Ambulatory Visit: Payer: Self-pay | Admitting: Family Medicine

## 2012-05-09 ENCOUNTER — Ambulatory Visit
Admission: RE | Admit: 2012-05-09 | Discharge: 2012-05-09 | Disposition: A | Discharge status: Home / Self Care | Payer: Managed Care, Other (non HMO) | Source: Ambulatory Visit | Attending: Family Medicine | Admitting: Family Medicine

## 2012-05-09 DIAGNOSIS — Z1231 Encounter for screening mammogram for malignant neoplasm of breast: Secondary | ICD-10-CM

## 2012-06-16 ENCOUNTER — Encounter: Payer: Self-pay | Admitting: Family Medicine

## 2012-06-16 ENCOUNTER — Ambulatory Visit
Admission: RE | Admit: 2012-06-16 | Discharge: 2012-06-16 | Disposition: A | Discharge status: Home / Self Care | Payer: Managed Care, Other (non HMO) | Source: Ambulatory Visit | Attending: Family Medicine | Admitting: Family Medicine

## 2012-06-16 ENCOUNTER — Ambulatory Visit (INDEPENDENT_AMBULATORY_CARE_PROVIDER_SITE_OTHER): Payer: Managed Care, Other (non HMO) | Admitting: Family Medicine

## 2012-06-16 VITALS — BP 141/73 | HR 70 | Temp 99.0°F | Ht 66.5 in | Wt 207.6 lb

## 2012-06-16 DIAGNOSIS — M12819 Other specific arthropathies, not elsewhere classified, unspecified shoulder: Secondary | ICD-10-CM

## 2012-06-16 DIAGNOSIS — M19019 Primary osteoarthritis, unspecified shoulder: Secondary | ICD-10-CM

## 2012-06-16 DIAGNOSIS — E119 Type 2 diabetes mellitus without complications: Secondary | ICD-10-CM

## 2012-06-16 DIAGNOSIS — L989 Disorder of the skin and subcutaneous tissue, unspecified: Secondary | ICD-10-CM

## 2012-06-16 LAB — POCT GLYCOSYLATED HEMOGLOBIN (HGB A1C): Hemoglobin A1C: 7.1

## 2012-06-16 MED ORDER — IBUPROFEN 600 MG PO TABS: 600.0000 mg | ORAL_TABLET | Freq: Three times a day (TID) | ORAL | Status: DC | PRN

## 2012-06-16 NOTE — Patient Instructions (Addendum)
I am concerned your rotator cuff is worse or you have frozen shoulder. This will not get better on its own. Get xray done. Start doing home stretches. You may take motrin for pain. Make appointment to get shoulder mole removed, this doesn't look normal and need to rule out cancer. Follow up with [facility], we are making referral.   Shoulder Range of Motion Exercises The shoulder is the most flexible joint in the human body. Because of this it is also the most unstable joint in the body. All ages can develop shoulder problems. Early treatment of problems is necessary for a good outcome. People react to shoulder pain by decreasing the movement of the joint. After a brief period of time, the shoulder can become "frozen". This is an almost complete loss of the ability to move the damaged shoulder. Following injuries your caregivers can give you instructions on exercises to keep your range of motion (ability to move your shoulder freely), or regain it if it has been lost.  EXERCISES EXERCISES TO MAINTAIN THE MOBILITY OF YOUR SHOULDER: Codman's Exercise or Pendulum Exercise  This exercise may be performed in a prone (face-down) lying position or standing while leaning on a chair with the opposite arm. Its purpose is to relax the muscles in your shoulder and slowly but surely increase the range of motion and to relieve pain.  Lie on your stomach close to the side edge of the bed. Let your weak arm hang over the edge of the bed. Relax your shoulder, arm and hand. Let your shoulder blade relax and drop down.  Slowly and gently swing your arm forward and back. Do not use your neck muscles; relax them. It might be easier to have someone else gently start swinging your arm.  As pain decreases, increase your swing. To start, arm swing should begin at 15 degree angles. In time and as pain lessens, move to 30-45 degree angles. Start with swinging for about 15 seconds, and work towards swinging for 3 to 5  minutes.  This exercise may also be performed in a standing/bent over position.  Stand and hold onto a sturdy chair with your good arm. Bend forward at the waist and bend your knees slightly to help protect your back. Relax your weak arm, let it hang limp. Relax your shoulder blade and let it drop.  Keep your shoulder relaxed and use body motion to swing your arm in small circles.  Stand up tall and relax.  Repeat motion and change direction of circles.  Start with swinging for about 30 seconds, and work towards swinging for 3 to 5 minutes. STRETCHING EXERCISES:  Lift your arm out in front of you with the elbow bent at 90 degrees. Using your other arm gently pull the elbow forward and across your body.  Bend one arm behind you with the palm facing outward. Using the other arm, hold a towel or rope and reach this arm up above your head, then bend it at the elbow to move your wrist to behind your neck. Grab the free end of the towel with the hand behind your back. Gently pull the towel up with the hand behind your neck, gradually increasing the pull on the hand behind the small of your back. Then, gradually pull down with the hand behind the small of your back. This will pull the hand and arm behind your neck further. Both shoulders will have an increased range of motion with repetition of this exercise. STRENGTHENING  EXERCISES:  Standing with your arm at your side and straight out from your shoulder with the elbow bent at 90 degrees, hold onto a small weight and slowly raise your hand so it points straight up in the air. Repeat this five times to begin with, and gradually increase to ten times. Do this four times per day. As you grow stronger you can gradually increase the weight.  Repeat the above exercise, only this time using an elastic band. Start with your hand up in the air and pull down until your hand is by your side. As you grow stronger, gradually increase the amount you pull by  increasing the number or size of the elastic bands. Use the same amount of repetitions.  Standing with your hand at your side and holding onto a weight, gradually lift the hand in front of you until it is over your head. Do the same also with the hand remaining at your side and lift the hand away from your body until it is again over your head. Repeat this five times to begin with, and gradually increase to ten times. Do this four times per day. As you grow stronger you can gradually increase the weight. Document Released: 02/28/2003 Document Revised: 08/24/2011 Document Reviewed: 06/01/2005 [facility] Patient Information 2013 [place], [place].

## 2012-06-17 ENCOUNTER — Telehealth: Payer: Self-pay | Admitting: Family Medicine

## 2012-06-17 DIAGNOSIS — L989 Disorder of the skin and subcutaneous tissue, unspecified: Secondary | ICD-10-CM | POA: Insufficient documentation

## 2012-06-17 NOTE — Progress Notes (Signed)
  Subjective:    [name], female    DOB: 1951-02-20, 62 y.o.   MRN: [id number]  HPI  1. Right shoulder pain. 62 yo diabetic pt seen initially about 6 months ago for similar. Her injection didn't provide much help. She did not f/u with Sports medicine or go to PT. Exercises hurt so she quit doing those too. She follow up now because pain is becoming too severe to function. Her range of motion is decreased. Pain worse at night.  Denies injury, swelling, redness.  Has history of left rotator cuff repair, seen by [facility].  Review of Systems See HPI otherwise negative.  reports that she has never smoked. She does not have any smokeless tobacco history on file.     Objective:   Physical Exam  Vitals reviewed. Constitutional: She appears well-developed and well-nourished. No distress.  Neck: Normal range of motion. Neck supple.  Musculoskeletal: She exhibits tenderness.       TTP in lateral and posterior aspects, supraclavicular. Rt shoulder abduction decreased 10-20 degrees in both passive and active ROM. Pain limits participation with Neer, Hawking and empty can test but appear to be positive.  Distal grip strength and sensation intact.  Lymphadenopathy:    She has no cervical adenopathy.  Skin: She is not diaphoretic.       Has 2 mm hyperpigmented macular lesion with central thickening and umbilication superior to right glenohumeral head. Nontender.        Assessment & Plan:

## 2012-06-17 NOTE — Assessment & Plan Note (Signed)
A1c slightly worsened but still near goal. F/u in 3 months and consider increase metformin or addition of GLP-1 or DPP4 as needed.

## 2012-06-17 NOTE — Assessment & Plan Note (Signed)
Atypical characteristics. Advised pt to return for punch biopsy to rule out malignancy. She understands.

## 2012-06-17 NOTE — Telephone Encounter (Signed)
xrays show some arthritis in shoulder. Will proceed with orthopedics [facility] referral.

## 2012-06-17 NOTE — Assessment & Plan Note (Signed)
Decompensated right rotator cuff arthropathy secondary to being sedentary and not following up for further evaluation, PT. Suspect component of frozen shoulder with decreased ROM. First injection didn't help much, will not repeat. At this point will refer back to Orthopedics since she is high risk of requiring a second repair (had left side repaired in past). Check plain films to rule out bone pathology. Advised to perform ROM activities even if painful.

## 2012-07-11 ENCOUNTER — Encounter (HOSPITAL_BASED_OUTPATIENT_CLINIC_OR_DEPARTMENT_OTHER): Payer: Self-pay | Admitting: *Deleted

## 2012-07-11 NOTE — Progress Notes (Signed)
Pt will come in for [id number] to [facility] family practice-had chest pain 2010-cath was negative-none since To bring all meds and overnight bag in case

## 2012-07-12 ENCOUNTER — Encounter (HOSPITAL_BASED_OUTPATIENT_CLINIC_OR_DEPARTMENT_OTHER)
Admission: RE | Admit: 2012-07-12 | Discharge: 2012-07-12 | Disposition: A | Discharge status: Home / Self Care | Payer: Managed Care, Other (non HMO) | Source: Ambulatory Visit | Attending: General Surgery | Admitting: General Surgery

## 2012-07-12 ENCOUNTER — Encounter (HOSPITAL_BASED_OUTPATIENT_CLINIC_OR_DEPARTMENT_OTHER): Admission: RE | Admit: 2012-07-12 | Payer: Managed Care, Other (non HMO) | Source: Ambulatory Visit

## 2012-07-12 LAB — BASIC METABOLIC PANEL
BUN: 21 mg/dL (ref 6–23)
CO2: 28 mEq/L (ref 19–32)
Chloride: 104 mEq/L (ref 96–112)
Creatinine, Ser: 0.84 mg/dL (ref 0.50–1.10)
GFR calc Af Amer: 85 mL/min — ABNORMAL LOW (ref >90)
Glucose, Bld: 102 mg/dL — ABNORMAL HIGH (ref 70–99)
Potassium: 4 mEq/L (ref 3.5–5.1)

## 2012-07-13 NOTE — H&P (Signed)
[facility] [address]   [address] [place], [place] [address] [phone] A Division of [facility]  Loreta Ave, M.D.   Robert A. Thurston Hole, M.D.   Burnell Blanks, M.D.   Eulas Post, M.D.   Lunette Stands, M.D Buford Dresser, M.D.  Charlsie Quest, M.D.   Estell Harpin, M.D.   Melina Fiddler, M.D. Genene Churn. Barry Dienes, PA-C            Kirstin A. Shepperson, PA-C Josh Melville, PA-C [place], [place]  RE: Candice Warner, Candice Warner   [phone]      DOB: 1951-03-23 PROGRESS NOTE: 06-22-12 Right shoulder pain.   SUBJECTIVE [name] is a 62 year old African-American female here today with a complaint of right shoulder and neck pain. She reports a history of left rotator cuff surgery, and over the past 3 months has developed increasing pain in her right shoulder. She describes the pain as worse in the right shoulder radiating down to the elbow. It bothers her mostly at night and also if she does any overhead activity or lifting. She describes the pain as an 8/10, aching constant pain. She has been seen at [facility]. She had a cortisone injection in November which helped for a couple of days. Has also been doing ibuprofen but that hasn't seemed to help. They gave her some home exercises but that seemed to make her worse. She has done some physical therapy in the remote past for her other shoulder so she has been trying to do those exercises as well. Past Medical History:   Positive for hypertension, thyroid disease and diabetes. She takes metformin and some other medications that she does not know the name of. She denies any allergies to medications. Social History:   She is widowed. Denies tobacco or alcohol. Works part time in [employer] at [employer]. Review of Systems:   Negative for any history of stomach ulcers. Otherwise negative for 14 systems except for what is listed above and some mild muscular neck pain. She  denies numbness or tingling.   OBJECTIVE: On exam she is alert and oriented x3. Respiratory rate is 12. She is 5'6", 207 pounds. Evaluation of her neck reveals some mild muscular tenderness in the right trapezius. She has full range of motion of her neck without pain. Negative Spurling's. Her right shoulder is tender along the anterior and posterior joint line. She has pain with abduction and flexion beyond 80 degrees, but passively I can get her through most full range of motion. She has a little loss of external rotation on the right compared to the left. She has pain with a little give way weakness on empty can. Also some pain with resisted internal/external rotation. She has negative apprehension. Positive impingement testing. Negative clunk and crank test.   X-RAYS: I reviewed her x-rays which were done on [place] on 06-16-12 which show some AC arthropathy with a Type-3 acromion. She has also got some mild glenohumeral arthritis.   IMPRESSION:   Right rotator cuff tendinopathy with a Type-3 acromion. Possibly some underlying degenerative tears. Failed home exercise program and cortisone injection x1.   PLAN:   We are going to set [place] up for an MRI scan. Dr. Eulah Pont did her rotator cuff surgery on the left so I am going to set her up to follow-up with him. Also tried a cortisone today in the subacromial space to see if we could give her a little  bit of relief. We are going to switch her over to meloxicam to see if that will help with her symptoms. She is going to follow-up with Dr. Eulah Pont.   Melina Fiddler, M.D. Electronically verified by Melina Fiddler, M.D. RSB:tal D 06-23-12, T 06-23-12 CC:  Dr. Mckinley Jewel   [address]   [address] [place], [place] [address] [phone] A Division of [facility]  Loreta Ave, M.D.   Robert A. Thurston Hole, M.D.   Burnell Blanks, M.D.   Eulas Post, M.D.   Lunette Stands, M.D Buford Dresser,  M.D.  Charlsie Quest, M.D.   Estell Harpin, M.D.   Melina Fiddler, M.D. Genene Churn. Barry Dienes, PA-C            Kirstin A. Shepperson, PA-C Josh Creedmoor, PA-C [place], [place]   RE: Candice Warner, Candice Warner                                [phone]      DOB: 04-02-1951 PROGRESS NOTE: 07-01-12 [age] year-old black female with a history of right shoulder pain.  Comes in for review of her MRI scan performed on June 25, 2012.  Scan showed large full thickness retracted supraspinatus tendon tear and mild fatty atrophy of the supraspinatus tendon.  Tendon retraction to about 2.5 centimeters.  Significant tendinopathy of the infraspinatus and subscapularis tendons.  Intact biceps tendon and labrum.  She has been seen in the past by Dr. Cleophas Dunker who ordered the scan.  Progressively worsening pain with overhead activity and reaching behind her back.  Pain does awaken her at night when she lies on her side.  Feels like her arm is getting weaker going overhead.  No cervical spine or radicular component.    EXAMINATION: Pleasant black female, alert and oriented x 3 and in no acute distress.  Cervical spine unremarkable.  Right shoulder she has good passive range of motion.  Active forward flexion and abduction to about 110-120 degrees.  Positive impingement test.  Negative drop arm.  Neurovascularly intact.  Skin warm and dry.    IMPRESSION: Right shoulder pain secondary to impingement and retracted rotator cuff tear.  PLAN: Advised patient that the only viable option at this point would be shoulder arthroscopy with debridement, subacromial decompression, DCE and possible rotator cuff repair.  Told patient that she should have this done sooner rather than later due to the amount of retraction that she has and this could possibly be irreparable.  Pre-op paperwork filled out.  She has had left shoulder cuff repair in the past so she knows what to expect.    Loreta Ave, M.D.   Electronically verified by Loreta Ave, M.D. DFM(JMO):jjh D 07-04-12 T 07-05-12

## 2012-07-14 ENCOUNTER — Encounter (HOSPITAL_BASED_OUTPATIENT_CLINIC_OR_DEPARTMENT_OTHER): Payer: Self-pay | Admitting: Anesthesiology

## 2012-07-14 ENCOUNTER — Ambulatory Visit: Payer: Managed Care, Other (non HMO) | Admitting: Family Medicine

## 2012-07-14 ENCOUNTER — Ambulatory Visit (HOSPITAL_BASED_OUTPATIENT_CLINIC_OR_DEPARTMENT_OTHER): Payer: Managed Care, Other (non HMO) | Admitting: Anesthesiology

## 2012-07-14 ENCOUNTER — Encounter (HOSPITAL_BASED_OUTPATIENT_CLINIC_OR_DEPARTMENT_OTHER)
Admission: RE | Disposition: A | Discharge status: Home / Self Care | Payer: Self-pay | Source: Ambulatory Visit | Attending: Orthopedic Surgery

## 2012-07-14 ENCOUNTER — Ambulatory Visit (HOSPITAL_BASED_OUTPATIENT_CLINIC_OR_DEPARTMENT_OTHER)
Admission: RE | Admit: 2012-07-14 | Discharge: 2012-07-14 | Disposition: A | Discharge status: Home / Self Care | Payer: Managed Care, Other (non HMO) | Source: Ambulatory Visit | Attending: Orthopedic Surgery | Admitting: Orthopedic Surgery

## 2012-07-14 ENCOUNTER — Other Ambulatory Visit: Payer: Self-pay | Admitting: Family Medicine

## 2012-07-14 ENCOUNTER — Encounter (HOSPITAL_BASED_OUTPATIENT_CLINIC_OR_DEPARTMENT_OTHER): Payer: Self-pay | Admitting: *Deleted

## 2012-07-14 DIAGNOSIS — M19019 Primary osteoarthritis, unspecified shoulder: Secondary | ICD-10-CM | POA: Insufficient documentation

## 2012-07-14 DIAGNOSIS — M75110 Incomplete rotator cuff tear or rupture of unspecified shoulder, not specified as traumatic: Secondary | ICD-10-CM | POA: Insufficient documentation

## 2012-07-14 DIAGNOSIS — I10 Essential (primary) hypertension: Secondary | ICD-10-CM | POA: Insufficient documentation

## 2012-07-14 DIAGNOSIS — M66329 Spontaneous rupture of flexor tendons, unspecified upper arm: Secondary | ICD-10-CM | POA: Insufficient documentation

## 2012-07-14 DIAGNOSIS — E119 Type 2 diabetes mellitus without complications: Secondary | ICD-10-CM | POA: Insufficient documentation

## 2012-07-14 DIAGNOSIS — M25819 Other specified joint disorders, unspecified shoulder: Secondary | ICD-10-CM | POA: Insufficient documentation

## 2012-07-14 DIAGNOSIS — Z9889 Other specified postprocedural states: Secondary | ICD-10-CM

## 2012-07-14 DIAGNOSIS — Z01810 Encounter for preprocedural cardiovascular examination: Secondary | ICD-10-CM | POA: Insufficient documentation

## 2012-07-14 DIAGNOSIS — M7500 Adhesive capsulitis of unspecified shoulder: Secondary | ICD-10-CM | POA: Insufficient documentation

## 2012-07-14 DIAGNOSIS — Z01812 Encounter for preprocedural laboratory examination: Secondary | ICD-10-CM | POA: Insufficient documentation

## 2012-07-14 HISTORY — PX: SHOULDER ARTHROSCOPY WITH ROTATOR CUFF REPAIR AND SUBACROMIAL DECOMPRESSION: SHX5686

## 2012-07-14 LAB — POCT HEMOGLOBIN-HEMACUE: Hemoglobin: 10.5 g/dL — ABNORMAL LOW (ref 12.0–15.0)

## 2012-07-14 LAB — GLUCOSE, CAPILLARY: Glucose-Capillary: 102 mg/dL — ABNORMAL HIGH (ref 70–99)

## 2012-07-14 SURGERY — SHOULDER ARTHROSCOPY WITH ROTATOR CUFF REPAIR AND SUBACROMIAL DECOMPRESSION
Anesthesia: General | Site: Shoulder | Laterality: Right | Wound class: Clean

## 2012-07-14 MED ORDER — SUCCINYLCHOLINE CHLORIDE 20 MG/ML IJ SOLN
INTRAMUSCULAR | Status: DC | PRN
  Administered 2012-07-14: 100 mg via INTRAVENOUS

## 2012-07-14 MED ORDER — HYDROMORPHONE HCL PF 1 MG/ML IJ SOLN: 0.2500 mg | INTRAMUSCULAR | Status: DC | PRN

## 2012-07-14 MED ORDER — OXYCODONE-ACETAMINOPHEN 5-325 MG PO TABS: 1.0000 | ORAL_TABLET | Freq: Four times a day (QID) | ORAL | Status: DC | PRN

## 2012-07-14 MED ORDER — MEPERIDINE HCL 25 MG/ML IJ SOLN: 6.2500 mg | INTRAMUSCULAR | Status: DC | PRN

## 2012-07-14 MED ORDER — ONDANSETRON HCL 4 MG/2ML IJ SOLN
INTRAMUSCULAR | Status: DC | PRN
  Administered 2012-07-14: 4 mg via INTRAVENOUS

## 2012-07-14 MED ORDER — PROPOFOL 10 MG/ML IV BOLUS
INTRAVENOUS | Status: DC | PRN
  Administered 2012-07-14: 150 mg via INTRAVENOUS

## 2012-07-14 MED ORDER — SODIUM CHLORIDE 0.9 % IR SOLN
Status: DC | PRN
  Administered 2012-07-14: 15000 mL

## 2012-07-14 MED ORDER — LIDOCAINE HCL (CARDIAC) 20 MG/ML IV SOLN
INTRAVENOUS | Status: DC | PRN
  Administered 2012-07-14: 60 mg via INTRAVENOUS

## 2012-07-14 MED ORDER — LACTATED RINGERS IV SOLN
INTRAVENOUS | Status: DC
  Administered 2012-07-14 (×2): via INTRAVENOUS

## 2012-07-14 MED ORDER — ONDANSETRON HCL 4 MG/2ML IJ SOLN: 4.0000 mg | Freq: Once | INTRAMUSCULAR | Status: DC | PRN

## 2012-07-14 MED ORDER — CEFAZOLIN SODIUM-DEXTROSE 2-3 GM-% IV SOLR
2.0000 g | INTRAVENOUS | Status: AC
  Administered 2012-07-14: 2 g via INTRAVENOUS

## 2012-07-14 MED ORDER — EPHEDRINE SULFATE 50 MG/ML IJ SOLN
INTRAMUSCULAR | Status: DC | PRN
  Administered 2012-07-14: 5 mg via INTRAVENOUS

## 2012-07-14 MED ORDER — OXYCODONE HCL 5 MG/5ML PO SOLN: 5.0000 mg | Freq: Once | ORAL | Status: DC | PRN

## 2012-07-14 MED ORDER — DEXAMETHASONE SODIUM PHOSPHATE 4 MG/ML IJ SOLN
INTRAMUSCULAR | Status: DC | PRN
  Administered 2012-07-14: 10 mg via INTRAVENOUS

## 2012-07-14 MED ORDER — BUPIVACAINE-EPINEPHRINE PF 0.5-1:200000 % IJ SOLN
INTRAMUSCULAR | Status: DC | PRN
  Administered 2012-07-14: 30 mL

## 2012-07-14 MED ORDER — FENTANYL CITRATE 0.05 MG/ML IJ SOLN
50.0000 ug | INTRAMUSCULAR | Status: DC | PRN
  Administered 2012-07-14: 100 ug via INTRAVENOUS

## 2012-07-14 MED ORDER — OXYCODONE HCL 5 MG PO TABS: 5.0000 mg | ORAL_TABLET | Freq: Once | ORAL | Status: DC | PRN

## 2012-07-14 MED ORDER — MIDAZOLAM HCL 2 MG/2ML IJ SOLN
1.0000 mg | INTRAMUSCULAR | Status: DC | PRN
  Administered 2012-07-14: 2 mg via INTRAVENOUS

## 2012-07-14 SURGICAL SUPPLY — 77 items
ANCH SUT SWLK 19.1X5.5 CLS EL (Anchor) ×3 IMPLANT
ANCHOR PEEK SWIVEL LOCK 5.5 (Anchor) ×3 IMPLANT
APL SKNCLS STERI-STRIP NONHPOA (GAUZE/BANDAGES/DRESSINGS)
BENZOIN TINCTURE PRP APPL 2/3 (GAUZE/BANDAGES/DRESSINGS) IMPLANT
BLADE CUTTER GATOR 3.5 (BLADE) ×2 IMPLANT
BLADE CUTTER MENIS 5.5 (BLADE) IMPLANT
BLADE GREAT WHITE 4.2 (BLADE) ×2 IMPLANT
BLADE SURG 15 STRL LF DISP TIS (BLADE) IMPLANT
BLADE SURG 15 STRL SS (BLADE)
BUR OVAL 6.0 (BURR) ×2 IMPLANT
CANISTER OMNI JUG 16 LITER (MISCELLANEOUS) ×2 IMPLANT
CANISTER SUCTION 2500CC (MISCELLANEOUS) IMPLANT
CANNULA DRY DOC 8X75 (CANNULA) ×1 IMPLANT
CANNULA TWIST IN 8.25X7CM (CANNULA) IMPLANT
CLOTH BEACON ORANGE TIMEOUT ST (SAFETY) ×2 IMPLANT
DECANTER SPIKE VIAL GLASS SM (MISCELLANEOUS) IMPLANT
DRAPE OEC MINIVIEW 54X84 (DRAPES) IMPLANT
DRAPE STERI 35X30 U-POUCH (DRAPES) ×2 IMPLANT
DRAPE U-SHAPE 47X51 STRL (DRAPES) ×2 IMPLANT
DRAPE U-SHAPE 76X120 STRL (DRAPES) ×4 IMPLANT
DRSG PAD ABDOMINAL 8X10 ST (GAUZE/BANDAGES/DRESSINGS) ×2 IMPLANT
DURAPREP 26ML APPLICATOR (WOUND CARE) ×2 IMPLANT
ELECT MENISCUS 165MM 90D (ELECTRODE) ×2 IMPLANT
ELECT NDL TIP 2.8 STRL (NEEDLE) IMPLANT
ELECT NEEDLE TIP 2.8 STRL (NEEDLE) IMPLANT
ELECT REM PT RETURN 9FT ADLT (ELECTROSURGICAL) ×2
ELECTRODE REM PT RTRN 9FT ADLT (ELECTROSURGICAL) ×1 IMPLANT
GAUZE XEROFORM 1X8 LF (GAUZE/BANDAGES/DRESSINGS) ×2 IMPLANT
GLOVE BIO SURGEON STRL SZ 6.5 (GLOVE) ×1 IMPLANT
GLOVE BIOGEL PI IND STRL 7.0 (GLOVE) IMPLANT
GLOVE BIOGEL PI IND STRL 8 (GLOVE) ×1 IMPLANT
GLOVE BIOGEL PI INDICATOR 7.0 (GLOVE) ×1
GLOVE BIOGEL PI INDICATOR 8 (GLOVE) ×1
GLOVE ORTHO TXT STRL SZ7.5 (GLOVE) ×4 IMPLANT
GOWN PREVENTION PLUS XLARGE (GOWN DISPOSABLE) ×2 IMPLANT
GOWN PREVENTION PLUS XXLARGE (GOWN DISPOSABLE) ×1 IMPLANT
GOWN STRL REIN 2XL XLG LVL4 (GOWN DISPOSABLE) ×2 IMPLANT
IV NS IRRIG 3000ML ARTHROMATIC (IV SOLUTION) ×9 IMPLANT
NDL SCORPION MULTI FIRE (NEEDLE) IMPLANT
NDL SUT 6 .5 CRC .975X.05 MAYO (NEEDLE) IMPLANT
NEEDLE MAYO TAPER (NEEDLE)
NEEDLE SCORPION MULTI FIRE (NEEDLE) ×2 IMPLANT
NS IRRIG 1000ML POUR BTL (IV SOLUTION) IMPLANT
PACK ARTHROSCOPY DSU (CUSTOM PROCEDURE TRAY) ×2 IMPLANT
PACK BASIN DAY SURGERY FS (CUSTOM PROCEDURE TRAY) ×2 IMPLANT
PASSER SUT SWANSON 36MM LOOP (INSTRUMENTS) IMPLANT
PENCIL BUTTON HOLSTER BLD 10FT (ELECTRODE) ×2 IMPLANT
SET ARTHROSCOPY TUBING (MISCELLANEOUS) ×2
SET ARTHROSCOPY TUBING LN (MISCELLANEOUS) ×1 IMPLANT
SLEEVE SCD COMPRESS KNEE MED (MISCELLANEOUS) ×1 IMPLANT
SLING ARM FOAM STRAP LRG (SOFTGOODS) IMPLANT
SLING ARM FOAM STRAP MED (SOFTGOODS) IMPLANT
SLING ARM FOAM STRAP XLG (SOFTGOODS) IMPLANT
SLING ARM IMMOBILIZER LRG (SOFTGOODS) ×1 IMPLANT
SLING ARM IMMOBILIZER MED (SOFTGOODS) IMPLANT
SPONGE GAUZE 4X4 12PLY (GAUZE/BANDAGES/DRESSINGS) ×4 IMPLANT
SPONGE LAP 4X18 X RAY DECT (DISPOSABLE) IMPLANT
STRIP CLOSURE SKIN 1/2X4 (GAUZE/BANDAGES/DRESSINGS) IMPLANT
SUCTION FRAZIER TIP 10 FR DISP (SUCTIONS) IMPLANT
SUT ETHIBOND 2 OS 4 DA (SUTURE) IMPLANT
SUT ETHILON 2 0 FS 18 (SUTURE) IMPLANT
SUT ETHILON 3 0 PS 1 (SUTURE) ×1 IMPLANT
SUT FIBERWIRE #2 38 T-5 BLUE (SUTURE)
SUT RETRIEVER MED (INSTRUMENTS) IMPLANT
SUT STEEL 4 (SUTURE) IMPLANT
SUT STEEL 5 (SUTURE) IMPLANT
SUT TIGER TAPE 7 IN WHITE (SUTURE) ×1 IMPLANT
SUT VIC AB 0 CT1 27 (SUTURE)
SUT VIC AB 0 CT1 27XBRD ANBCTR (SUTURE) IMPLANT
SUT VIC AB 2-0 SH 27 (SUTURE)
SUT VIC AB 2-0 SH 27XBRD (SUTURE) IMPLANT
SUT VIC AB 3-0 FS2 27 (SUTURE) IMPLANT
SUTURE FIBERWR #2 38 T-5 BLUE (SUTURE) IMPLANT
TAPE FIBER 2MM 7IN #2 BLUE (SUTURE) ×1 IMPLANT
TOWEL OR 17X24 6PK STRL BLUE (TOWEL DISPOSABLE) ×2 IMPLANT
WATER STERILE IRR 1000ML POUR (IV SOLUTION) ×2 IMPLANT
YANKAUER SUCT BULB TIP NO VENT (SUCTIONS) IMPLANT

## 2012-07-14 NOTE — Anesthesia Procedure Notes (Addendum)
Anesthesia Regional Block:  Interscalene brachial plexus block  Pre-Anesthetic Checklist: ,, timeout performed, Correct Patient, Correct Site, Correct Laterality, Correct Procedure, Correct Position, site marked, Risks and benefits discussed,  Surgical consent,  Pre-op evaluation,  At surgeon's request and post-op pain management  Laterality: Right  Prep: chloraprep       Needles:  Injection technique: Single-shot  Needle Type: Echogenic Stimulator Needle     Needle Length: 5cm 5 cm     Additional Needles:  Procedures: ultrasound guided (picture in chart) and nerve stimulator Interscalene brachial plexus block  Nerve Stimulator or Paresthesia:  Response: 0.4 mA,   Additional Responses:   Narrative:  Start time: 07/14/2012 9:19 AM End time: 07/14/2012 9:25 AM  Performed by: Personally  Anesthesiologist: Arta Bruce MD  Additional Notes: Monitors applied. Patient sedated. Sterile prep and drape,hand hygiene and sterile gloves were used. Relevant anatomy identified.Needle position confirmed.Local anesthetic injected incrementally after negative aspiration. Local anesthetic spread visualized around nerve(s). Vascular puncture avoided. No complications. Image printed for medical record.The patient tolerated the procedure well.       Interscalene brachial plexus block Procedure Name: Intubation Date/Time: 07/14/2012 11:29 AM Performed by: Caren Macadam Pre-anesthesia Checklist: Patient identified, Emergency Drugs available, Suction available and Patient being monitored Patient Re-evaluated:Patient Re-evaluated prior to inductionOxygen Delivery Method: Circle System Utilized Preoxygenation: Pre-oxygenation with 100% oxygen Intubation Type: IV induction Ventilation: Mask ventilation without difficulty Laryngoscope Size: Miller and 2 Grade View: Grade I Tube type: Oral Tube size: 7.0 mm Number of attempts: 1 Airway Equipment and Method: stylet Placement Confirmation: ETT  inserted through vocal cords under direct vision,  positive ETCO2 and breath sounds checked- equal and bilateral Secured at: 22 cm Tube secured with: Tape Dental Injury: Teeth and Oropharynx as per pre-operative assessment

## 2012-07-14 NOTE — Transfer of Care (Signed)
Immediate Anesthesia Transfer of Care Note  Patient: Candice Warner  Procedure(s) Performed: Procedure(s) (LRB) with comments: SHOULDER ARTHROSCOPY WITH ROTATOR CUFF REPAIR AND SUBACROMIAL DECOMPRESSION (Right) - Right Shoulder Arthroscopy, Distal Claviculectomy, Subacromial Decompression, Partial Acromioplasty with Coracoacromial Release with Arthroscopic Rotator Cuff Repair   Patient Location: PACU  Anesthesia Type:General and GA combined with regional for post-op pain  Level of Consciousness: awake and alert   Airway & Oxygen Therapy: Patient Spontanous Breathing and Patient connected to face mask oxygen  Post-op Assessment: Report given to PACU RN and Post -op Vital signs reviewed and stable  Post vital signs: Reviewed and stable  Complications: No apparent anesthesia complications

## 2012-07-14 NOTE — Anesthesia Postprocedure Evaluation (Signed)
Anesthesia Post Note  Patient: Candice Warner  Procedure(s) Performed: Procedure(s) (LRB): SHOULDER ARTHROSCOPY WITH ROTATOR CUFF REPAIR AND SUBACROMIAL DECOMPRESSION (Right)  Anesthesia type: general  Patient location: PACU  Post pain: Pain level controlled  Post assessment: Patient's Cardiovascular Status Stable  Last Vitals:  Filed Vitals:   07/14/12 1315  BP: 132/75  Pulse: 74  Temp: 36.7 C  Resp: 16    Post vital signs: Reviewed and stable  Level of consciousness: sedated  Complications: No apparent anesthesia complications

## 2012-07-14 NOTE — Interval H&P Note (Signed)
History and Physical Interval Note:  07/14/2012 7:33 AM  [name]  has presented today for surgery, with the diagnosis of dengerative arthritis right shoulder, disorders of bursae and tendons in shoulder region complete rupture of rotator cuff   The various methods of treatment have been discussed with the patient and family. After consideration of risks, benefits and other options for treatment, the patient has consented to  Procedure(s) (LRB) with comments: SHOULDER ARTHROSCOPY WITH ROTATOR CUFF REPAIR AND SUBACROMIAL DECOMPRESSION (Right) - Right Shoulder Arthroscopy Distal Claviculectomy Decompression Subacromial Partial Acromioplasty with Coracoacromial Release with Rotator Cuff Repair  as a surgical intervention .  The patient's history has been reviewed, patient examined, no change in status, stable for surgery.  I have reviewed the patient's chart and labs.  Questions were answered to the patient's satisfaction.     Kirrah Mustin F

## 2012-07-14 NOTE — Progress Notes (Signed)
Assisted Dr. Ossey with right, ultrasound guided, supraclavicular block. Side rails up, monitors on throughout procedure. See vital signs in flow sheet. Tolerated Procedure well. 

## 2012-07-14 NOTE — Anesthesia Preprocedure Evaluation (Signed)
Anesthesia Evaluation  Patient identified by MRN, date of birth, ID band Patient awake    Reviewed: Allergy & Precautions, H&P , NPO status , Patient's Chart, lab work & pertinent test results  Airway Mallampati: I TM Distance: >3 FB Neck ROM: Full    Dental   Pulmonary          Cardiovascular hypertension, Pt. on medications     Neuro/Psych    GI/Hepatic   Endo/Other  diabetes, Well Controlled, Type 2, Oral Hypoglycemic Agents  Renal/GU      Musculoskeletal   Abdominal   Peds  Hematology   Anesthesia Other Findings   Reproductive/Obstetrics                           Anesthesia Physical Anesthesia Plan  ASA: II  Anesthesia Plan: General   Post-op Pain Management:    Induction: Intravenous  Airway Management Planned: Oral ETT  Additional Equipment:   Intra-op Plan:   Post-operative Plan: Extubation in OR  Informed Consent: I have reviewed the patients History and Physical, chart, labs and discussed the procedure including the risks, benefits and alternatives for the proposed anesthesia with the patient or authorized representative who has indicated his/her understanding and acceptance.     Plan Discussed with: CRNA and Surgeon  Anesthesia Plan Comments:         Anesthesia Quick Evaluation  

## 2012-07-14 NOTE — Brief Op Note (Signed)
07/14/2012  1:43 PM  PATIENT:  Candice Warner  62 y.o. female  PRE-OPERATIVE DIAGNOSIS:  dengerative arthritis right shoulder, disorders of bursae and tendons in shoulder region complete rupture of rotator cuff   POST-OPERATIVE DIAGNOSIS:  dengerative arthritis right shoulder, disorders of bursae and tendons in shoulder region complete rupture of rotator cuff   PROCEDURE:  Procedure(s) (LRB) with comments: SHOULDER ARTHROSCOPY WITH ROTATOR CUFF REPAIR AND SUBACROMIAL DECOMPRESSION (Right) - Right Shoulder Arthroscopy, Distal Claviculectomy, Subacromial Decompression, Partial Acromioplasty with Coracoacromial Release with Arthroscopic Rotator Cuff Repair   SURGEON:  Surgeon(s) and Role:    * Loreta Ave, MD - Primary  PHYSICIAN ASSISTANT: Zonia Kief M   ANESTHESIA:   general  EBL:  Total I/O In: 1422 [P.O.:222; I.V.:1200] Out: -   SPECIMEN:  No Specimen  DISPOSITION OF SPECIMEN:  N/A  COUNTS:  YES  TOURNIQUET:  * No tourniquets in log *   PATIENT DISPOSITION:  PACU - hemodynamically stable.

## 2012-07-15 ENCOUNTER — Encounter (HOSPITAL_BASED_OUTPATIENT_CLINIC_OR_DEPARTMENT_OTHER): Payer: Self-pay | Admitting: Orthopedic Surgery

## 2012-07-15 NOTE — Op Note (Signed)
Candice Warner, Candice [name]:  [id number]  MEDICAL RECORD NO.:  [id number]  LOCATION:                                 FACILITY:  PHYSICIAN:  Loreta Ave, M.D. DATE OF BIRTH:  1950/08/16  DATE OF PROCEDURE:  07/14/2012 DATE OF DISCHARGE:                              OPERATIVE REPORT   PREOPERATIVE DIAGNOSES:  Right shoulder impingement.  Massive retracted rotator cuff tear.  Degenerative joint disease, acromioclavicular joint.  POSTOPERATIVE DIAGNOSES:  Right shoulder impingement.  Massive retracted rotator cuff tear.  Degenerative joint disease, acromioclavicular joint with only partially reparable cuff tear.  Marked tearing on long head biceps tendon.  Secondary adhesive capsulitis.  PROCEDURE:  Right shoulder exam under anesthesia with manipulation. Arthroscopy, lysis and debridement of adhesions.  Debridement, mobilization of rotator cuff with partial repair with fiber weave suture x2, swivel lock anchors x2.  Release resection of biceps tendon allowing to recess in the bicipital groove for tenodesis.  Bursectomy, acromioplasty, CA ligament release.  Excision of distal clavicle.  SURGEON:  Loreta Ave, M.D.  ASSISTANT:  Genene Churn. Barry Dienes, [place], present throughout the entire case and necessary for timely completion of procedure.  ANESTHESIA:  General.  BLOOD LOSS:  Minimal.  SPECIMENS:  None.  CULTURES:  None.  COMPLICATION:  None.  DRESSINGS:  Soft compressive shoulder immobilizer.  PROCEDURE:  The patient was brought to the operating room, and after adequate anesthesia had been obtained, shoulder examined.  More than 40% restricted motion from adhesions.  Gently manipulated, achieving full motion of stable shoulder.  Placed in beach-chair position on the shoulder positioner, prepped and draped in usual sterile fashion.  Three portals on anterior, posterior, lateral.  Arthroscope was introduced. Shoulder was distended and inspected.   Articular cartilage did not look bad.  Complete retracted rotator cuff tear of entire supraspinatus tendon.  Top edge to the subscap and infraspinatus.  Large U-shaped deformity.  The worst area, which was almost to the top of the glenoid. Debrided to healthy tissue.  Long-head biceps intact of the glenoid, but marked attrition tearing more than 50% as it was exiting the shoulder. Release resected allowing to recess on the bicipital groove.  From above, type 2 acromion.  Bursa was resected.  Acromioplasty to a type 1 acromion releasing the CA ligament.  Marked grade 4 changes on the [facility] joint with marked spurring at the [facility] joint.  Periarticular spurs lateral centimeter of clavicle resected.  I then did the best I could mobilize the cuff above and below, going well medial.  The muscle did not look too pill, but the quality of the tendon was extremely poor.  After a lot of work, I could bring this out so I could anchored the cable portion of the supraspinatus front and back with two horizontal mattress sutures, anchored down with swivel lock anchors in the tuberosity.  I attempted to put a third suture in the middle of the cuff, which was laying in appropriate position, but I wanted to anchor this down.  I could not get any viable bone at the tuberosity, the anchoring this down into.  I did have the cuff over top of  the entire shoulder out laterally and anchored in the front and the back.  Adequacy of decompression confirmed viewing from all portals.  Instruments and fluids were removed.  Portals were closed with nylon.  Sterile compressive dressing was applied.  Shoulder immobilizer was applied.  Anesthesia was reversed.  Brought to the recovery room.  Tolerated the surgery well.  No complications.     Loreta Ave, M.D.     DFM/MEDQ  D:  07/14/2012  T:  07/14/2012  Job:  [id number]

## 2012-10-24 ENCOUNTER — Other Ambulatory Visit: Payer: Self-pay | Admitting: Family Medicine

## 2012-10-27 ENCOUNTER — Telehealth: Payer: Self-pay | Admitting: *Deleted

## 2012-10-27 NOTE — Telephone Encounter (Signed)
[facility] calling for last leb results.Faxed as requested and message left to return call for confirmation. Wyatt Haste, RN-BSN

## 2012-12-22 ENCOUNTER — Other Ambulatory Visit: Payer: Self-pay

## 2013-02-01 ENCOUNTER — Ambulatory Visit (INDEPENDENT_AMBULATORY_CARE_PROVIDER_SITE_OTHER): Payer: Managed Care, Other (non HMO) | Admitting: Family Medicine

## 2013-02-01 ENCOUNTER — Encounter: Payer: Self-pay | Admitting: Family Medicine

## 2013-02-01 VITALS — BP 125/63 | HR 74 | Temp 99.0°F | Ht 66.5 in | Wt 207.0 lb

## 2013-02-01 DIAGNOSIS — L989 Disorder of the skin and subcutaneous tissue, unspecified: Secondary | ICD-10-CM

## 2013-02-01 DIAGNOSIS — E039 Hypothyroidism, unspecified: Secondary | ICD-10-CM

## 2013-02-01 DIAGNOSIS — I10 Essential (primary) hypertension: Secondary | ICD-10-CM

## 2013-02-01 DIAGNOSIS — Z0000 Encounter for general adult medical examination without abnormal findings: Secondary | ICD-10-CM

## 2013-02-01 DIAGNOSIS — E119 Type 2 diabetes mellitus without complications: Secondary | ICD-10-CM

## 2013-02-01 DIAGNOSIS — R252 Cramp and spasm: Secondary | ICD-10-CM

## 2013-02-01 MED ORDER — METFORMIN HCL 500 MG PO TABS: 1000.0000 mg | ORAL_TABLET | Freq: Two times a day (BID) | ORAL | Status: DC

## 2013-02-01 NOTE — Assessment & Plan Note (Signed)
At goal today. Continue current therapy.  

## 2013-02-01 NOTE — Assessment & Plan Note (Signed)
-   Patient with fatigue and leg cramps. Will check TSH today

## 2013-02-01 NOTE — Assessment & Plan Note (Addendum)
-   advised patient to have her screening colonoscopy completed. She is >10 years. She thinks she went to [facility] in the past.  information provided to patient on local providers phone numbers for screening colonoscopy/colon cancer.

## 2013-02-01 NOTE — Assessment & Plan Note (Signed)
-   re-check this next visit

## 2013-02-01 NOTE — Patient Instructions (Signed)
Leg Cramps Leg cramps that occur during exercise can be caused by poor circulation or dehydration. However, muscle cramps that occur at rest or during the night are usually not due to any serious medical problem. Heat cramps may cause muscle spasms during hot weather.  CAUSES There is no clear cause for muscle cramps. However, dehydration may be a factor for those who do not drink enough fluids and those who exercise in the heat. Imbalances in the level of sodium, potassium, calcium or magnesium in the muscle tissue may also be a factor. Some medications, such as water pills (diuretics), may cause loss of chemicals that the body needs (like sodium and potassium) and cause muscle cramps. TREATMENT   Make sure your diet has enough fluids and essential minerals for the muscle to work normally.  Avoid strenuous exercise for several days if you have been having frequent leg cramps.  Stretch and massage the cramped muscle for several minutes.  Some medicines may be helpful in some patients with night cramps. Only take over-the-counter or prescription medicines as directed by your caregiver. SEEK IMMEDIATE MEDICAL CARE IF:   Your leg cramps become worse.  Your foot becomes cold, numb, or blue. Document Released: 07/09/2004 Document Revised: 08/24/2011 Document Reviewed: 06/26/2008 [facility] Patient Information 2014 [place], [place].  _ I recommend you schedule your routine colonoscopy. I have included the phone numbers to local offices.  - We will complete blood work today and we will call with results once available.  - Make sure to drink 8 (8-ounces of water a day)  - Attempt to eat a diabetic friendly diet to help bring down weight and possibly medication need.  - I have increased your medications today to 1000 mg at night and 1000mg  at bedtime ( 2000mg  a day)

## 2013-02-01 NOTE — Progress Notes (Signed)
Subjective:     [name], female   DOB: 1950-09-29, 62 y.o.   MRN: [id number]  HPI Diabetes: Pt is present for diabetes check up. Last A1c 7.1. She has not been watching her diet or exercising. She has recently finished PT post shoulder surgery. She walks on occassions, but not frequent or strenous. She denies numbness or tingling in her extremities. She has not had an eye exam in over a year. She will need a foot exam next visit. She reports her sugars are out oc control still and she still gets readings in the 200's.   Leg cramps: She reports leg cramps that occur mostly in the morning. They are bilateral in nature. She has had issues with these in the past and had an electrolyte imbalance. She currently takes potassium supplements. She drinks about 2 (16ounce) bottle of water daily. She has a history of hypothyroid and she admits to more fatigue as well.   HTN:  She is at goal today. She denies CP, SOB, palpitations or dizziness.   Healthcare maintenance: - She is overdue for her colonoscopy (>10 years). She reports there were no concerns with her last routine colonoscopy. Information given today on provider locations.   Patient's past medical, social, and family history were reviewed and updated as appropriate. Review of Systems Negative, with the exception of above mentioned in HPI     Objective:   Physical Exam BP 125/63  Pulse 74  Temp(Src) 99 F (37.2 C) (Oral)  Ht 5' 6.5" (1.689 m)  Wt 207 lb (93.895 kg)  BMI 32.91 kg/m2 Gen: Pleasant female. Mildly obese.  CV: RRR; no JVD Chest: CTAB Abd: Soft. NTND Ext: No edema or erythema.   Next visit:  - Foot exam. - She is to schedule today for DM educator and eye exam; if not completed will need these as well.

## 2013-02-01 NOTE — Assessment & Plan Note (Signed)
Occurs in the morning upon awakening. Most likely electrolyte or dehydration variables. Will obtain labs today: CBC, CMP, B12, Vit D, CBC and mag Encourage to drink 8- 8 ounce water daily Will call with lab resutls

## 2013-02-02 LAB — CBC WITH DIFFERENTIAL/PLATELET
Lymphocytes Relative: 51 % — ABNORMAL HIGH (ref 12–46)
Lymphs Abs: 2.9 10*3/uL (ref 0.7–4.0)
MCV: 84.2 fL (ref 78.0–100.0)
Neutrophils Relative %: 36 % — ABNORMAL LOW (ref 43–77)
Platelets: 276 10*3/uL (ref 150–400)
RBC: 3.93 MIL/uL (ref 3.87–5.11)
WBC: 5.6 10*3/uL (ref 4.0–10.5)

## 2013-02-02 LAB — VITAMIN D 25 HYDROXY (VIT D DEFICIENCY, FRACTURES): Vit D, 25-Hydroxy: 35 ng/mL (ref 30–89)

## 2013-02-02 LAB — BASIC METABOLIC PANEL
BUN: 14 mg/dL (ref 6–23)
Creat: 0.82 mg/dL (ref 0.50–1.10)
Potassium: 3.3 mEq/L — ABNORMAL LOW (ref 3.5–5.3)

## 2013-02-02 LAB — TSH: TSH: 3.064 u[IU]/mL (ref 0.350–4.500)

## 2013-02-02 LAB — MAGNESIUM: Magnesium: 1.8 mg/dL (ref 1.5–2.5)

## 2013-02-03 ENCOUNTER — Telehealth: Payer: Self-pay | Admitting: Family Medicine

## 2013-02-03 NOTE — Telephone Encounter (Signed)
Called and left message asking pt to return call. Wyatt Haste, RN-BSN

## 2013-02-03 NOTE — Telephone Encounter (Signed)
Please call Ms. Auble, her potassium and B12 are mildly decreased from normal. This could be exacerbating her leg cramps. I want her to continue increasing her water intake. However, I also would like her to take her potassium twice a day as directed. If she has been taking twice a day I want her to take 3 times a day for the next 4 days and then, she can return to twice a day again. I also believe she needs to take a B12 supplement. She may take OTC oral preparation, or I can call in the nasal spray (unsure if expensive). If she opts for oral she should take 2000 mcg a day for 2 weeks and then a day thereafter. She will need to follow up in 1 month to recheck electrolytes and B12. Thanks.

## 2013-02-07 NOTE — Telephone Encounter (Signed)
Pt called and given directions - verbalized understanding. Wyatt Haste, RN-BSN

## 2013-02-09 ENCOUNTER — Encounter: Payer: Self-pay | Admitting: Home Health Services

## 2013-02-09 ENCOUNTER — Ambulatory Visit (INDEPENDENT_AMBULATORY_CARE_PROVIDER_SITE_OTHER): Payer: Managed Care, Other (non HMO) | Admitting: Home Health Services

## 2013-02-09 VITALS — BP 114/70 | HR 70 | Temp 98.0°F | Ht 66.5 in | Wt 205.0 lb

## 2013-02-09 DIAGNOSIS — E119 Type 2 diabetes mellitus without complications: Secondary | ICD-10-CM

## 2013-02-09 NOTE — Progress Notes (Signed)
DIABETES Pt came in to have a retinal scan per diabetic care.   Image was taken and submitted to [facility]-DR. Eben Burow for reading.    Results will be available in 1-2 weeks.  Results will be given to PCP for review and to contact patient.  Rosalita Chessman   Patient Identified Concern:  Pt was unclear about any specific health concerns.  She mentioned feeling tired and not having energy. Stage of Change Patient Is In:  Contemplation- planning on making some changes within the next 6 months. Patient Reported Barriers:  Busy with work, no appetite, does not like eating Patient Reported Perceived Benefits:  Having more energy Patient Reports Self-Efficacy:   Pt reported 8 out of 10 for ability to follow through with goals set. Behavior Change Supports:  TBD Goals:  Do PT exercises daily.  Dance for 45 minutes 2x a week-a little bit out of breath. Patient Education:  We talked about behaviors that influence DM/HTN management.  Pt reports taking all medications daily without missing any days.  Does not have many stressors, does not smoke.  Does not have a regular exercise routine, and usually eat 1-2 meals a day-skipping breakfast most days.  Pt was not ready to make any changes around diet or eating more regularly.  Pt displayed little emotion around health improvement or changes.  Will follow up with health coach telephonically.

## 2013-03-01 ENCOUNTER — Other Ambulatory Visit: Payer: Self-pay | Admitting: Family Medicine

## 2013-03-04 ENCOUNTER — Encounter: Payer: Self-pay | Admitting: Family Medicine

## 2013-04-21 ENCOUNTER — Other Ambulatory Visit: Payer: Self-pay | Admitting: Family Medicine

## 2013-04-24 ENCOUNTER — Other Ambulatory Visit: Payer: Self-pay

## 2013-04-24 DIAGNOSIS — Z1231 Encounter for screening mammogram for malignant neoplasm of breast: Secondary | ICD-10-CM

## 2013-04-26 ENCOUNTER — Ambulatory Visit (INDEPENDENT_AMBULATORY_CARE_PROVIDER_SITE_OTHER): Payer: Managed Care, Other (non HMO) | Admitting: Family Medicine

## 2013-04-26 ENCOUNTER — Encounter: Payer: Self-pay | Admitting: Family Medicine

## 2013-04-26 VITALS — BP 111/53 | HR 79 | Temp 99.4°F | Ht 66.5 in | Wt 196.0 lb

## 2013-04-26 DIAGNOSIS — I10 Essential (primary) hypertension: Secondary | ICD-10-CM

## 2013-04-26 DIAGNOSIS — E119 Type 2 diabetes mellitus without complications: Secondary | ICD-10-CM

## 2013-04-26 DIAGNOSIS — R252 Cramp and spasm: Secondary | ICD-10-CM

## 2013-04-26 LAB — COMPREHENSIVE METABOLIC PANEL
AST: 17 U/L (ref 0–37)
Alkaline Phosphatase: 60 U/L (ref 39–117)
BUN: 15 mg/dL (ref 6–23)
Creat: 0.9 mg/dL (ref 0.50–1.10)

## 2013-04-26 NOTE — Assessment & Plan Note (Addendum)
Diabetic foot exam completed today and was normal. A1c completed today and was 6.6 Microalbumin ordered today along with repeat CMP and B12 Followup in 4 months

## 2013-04-26 NOTE — Assessment & Plan Note (Signed)
Leg cramps have improved patient has started taking B12. Will repeat B12 and CMP today

## 2013-04-26 NOTE — Patient Instructions (Signed)
Vitamin B12 Deficiency Not having enough vitamin B12 is called a deficiency. Vitamin B12 is an important vitamin. Your body needs vitamin B12 to:   Make red blood cells.  Make DNA. This is the genetic material inside all of your cells.  Help your nerves work properly so they can carry messages from your brain to your body. CAUSES  Not eating enough foods that contain vitamin B12.  Not having enough stomach acid and digestive juices. The body needs these to absorb vitamin B12 from the food you eat.  Having certain digestive system diseases that make it hard to absorb vitamin B12. These diseases include Crohn's disease, chronic pancreatitis, and cystic fibrosis.  Having pernicious anemia, which is a condition where the body has too few red blood cells. People with this condition do not make enough of a protein called "intrinsic factor," which is needed to absorb vitamin B12.  Having a surgery in which part of the stomach or small intestine is removed.  Taking certain medicines that make it hard for the body to absorb vitamin B12. These medicines include:  Heartburn medicine (antacids and proton pump inhibitors).  A certain antibiotic medicine called neomycin, which fights infection.  Some medicines used to treat diabetes, tuberculosis, gout, and high cholesterol. RISK FACTORS Risk factors are things that make you more likely to develop a vitamin B12 deficiency. They include:  Being older than 50.  Being a vegetarian.  Being pregnant and a vegetarian or having a poor diet.  Taking certain drugs.  Being an alcoholic. SYMPTOMS You may have a vitamin B12 deficiency with no symptoms. However, a vitamin B12 deficiency can cause health problems like anemia and nerve damage. These health problems can lead to many possible symptoms, including:  Weakness.  Fatigue.  Loss of appetite.  Weight loss.  Numbness or tingling in your hands and feet.  Redness and burning of the  tongue.  Confusion or memory problems.  Depression.  Dizziness.  Sensory problems, such as loss of taste, color blindness, and ringing in the ears.  Diarrhea or constipation.  Trouble walking. DIAGNOSIS Various types of tests can be given to help find the cause of your vitamin B12 deficiency. These tests include:  A complete blood count (CBC). This test gives your caregiver an overall picture of what makes up your blood.  A blood test to measure your B12 level.  A blood test to measure intrinsic factor.  An endoscopy. This procedure uses a thin tube with a camera on the end to look into your stomach or intestines. TREATMENT Treatment for vitamin B12 deficiency depends on what is causing it. Common options include:  Changing your eating and drinking habits, such as:  Eating more foods that contain vitamin B12.  Not drinking as much alcohol or any alcohol.  Taking vitamin B12 supplements. Your caregiver will tell you what dose is best for you.  Getting vitamin B12 injections. Some people get these a few times a week. Others get them once a month. HOME CARE INSTRUCTIONS  Take all supplements as directed by your caregiver. Follow the directions carefully.  Get any injections your caregiver prescribes. Do not miss your appointments.  Eat lots of healthy foods that contain vitamin B12. Ask your caregiver if you should work with a nutritionist. Good things to include in your diet are:  Meat.  Poultry.  Fish.  Eggs.  Fortified cereal and dairy products. This means vitamin B12 has been added to the food. Check the label on the   package to be sure.  Do not abuse alcohol.  Keep all follow-up appointments. Your caregiver will need to perform blood tests to make sure your vitamin B12 deficiency is going away. SEEK MEDICAL CARE IF:  You have any questions about your treatment.  Your symptoms come back. MAKE SURE YOU:  Understand these instructions.  Will watch your  condition.  Will get help right away if you are not doing well or get worse. Document Released: 08/24/2011 Document Reviewed: 08/24/2011 [facility] Patient Information 2014 [place], [place].   Your hemoglobin A1c today was 6.6. I would not want to make any changes to your medications considering this great result. Continue to make dietary changes need more fresh vegetables. I want to see you back in 4-6 months to followup on your diabetes. We'll get some lab work today to recheck your electrolytes and I will call you with the results to

## 2013-04-26 NOTE — Addendum Note (Signed)
Addended by: Tanna Savoy on: 04/26/2013 11:19 AM   Modules accepted: Orders

## 2013-04-26 NOTE — Progress Notes (Signed)
  Subjective:    [name], female    DOB: 1951/06/09, 62 y.o.   MRN: [id number]  HPI Diabetes: Patient reports she has not been able to exercise due to fatigue. Her TSH 2 months ago was normal. States she just working more than her usual and can tolerate exercising afterwards. She states she's eating more fresh vegetables and stay away from fried foods. She has met with Arlys John for diabetes education. She does not test her glucose this time. She still metformin 500 mg twice a day.  Hypertension: She has no complications with blood pressure, no low or high blood pressure she reports.  Health maintenance: We'll perform urine microalbumin today. Again encouraged patient to get her colonoscopy.  Review of Systems Negative, with the exception of above mentioned in HPI  Objective:   Physical Exam BP 111/53  Pulse 79  Temp(Src) 99.4 F (37.4 C) (Oral)  Ht 5' 6.5" (1.689 m)  Wt 196 lb (88.905 kg)  BMI 31.16 kg/m2 Gen: NAD. Ple[place] HEENT: AT. [place]. CV: RRR  Chest: CTAB, no wheeze or crackles Ext: No erythema. No edema.  Foot exam completed today and documented under diabetic foot exam

## 2013-04-26 NOTE — Assessment & Plan Note (Signed)
At goal today no changes made

## 2013-04-27 ENCOUNTER — Encounter: Payer: Self-pay | Admitting: Family Medicine

## 2013-05-08 ENCOUNTER — Other Ambulatory Visit: Payer: Self-pay | Admitting: Family Medicine

## 2013-05-25 ENCOUNTER — Ambulatory Visit
Admission: RE | Admit: 2013-05-25 | Discharge: 2013-05-25 | Disposition: A | Discharge status: Home / Self Care | Payer: Managed Care, Other (non HMO) | Source: Ambulatory Visit

## 2013-05-25 DIAGNOSIS — Z1231 Encounter for screening mammogram for malignant neoplasm of breast: Secondary | ICD-10-CM

## 2013-06-06 ENCOUNTER — Telehealth: Payer: Self-pay | Admitting: *Deleted

## 2013-06-06 DIAGNOSIS — E119 Type 2 diabetes mellitus without complications: Secondary | ICD-10-CM

## 2013-06-06 NOTE — Telephone Encounter (Signed)
LMOVM for pt to return call.  Please advise that she is due for a cholesterol screen, order placed.  Please schedule a fasting lab appt. Fleeger, Jessica Dawn 

## 2013-06-22 ENCOUNTER — Other Ambulatory Visit: Payer: BC Managed Care – PPO

## 2013-06-22 DIAGNOSIS — E119 Type 2 diabetes mellitus without complications: Secondary | ICD-10-CM

## 2013-06-22 LAB — LIPID PANEL
CHOL/HDL RATIO: 3.5 ratio
CHOLESTEROL: 180 mg/dL (ref 0–200)
HDL: 52 mg/dL (ref >39)
LDL Cholesterol: 89 mg/dL (ref 0–99)
Triglycerides: 194 mg/dL — ABNORMAL HIGH (ref <150)
VLDL: 39 mg/dL (ref 0–40)

## 2013-06-22 NOTE — Progress Notes (Signed)
FLP DONE TODAY Candice Warner 

## 2013-07-21 ENCOUNTER — Other Ambulatory Visit: Payer: Self-pay | Admitting: Family Medicine

## 2013-08-03 ENCOUNTER — Telehealth: Payer: Self-pay | Admitting: *Deleted

## 2013-08-03 NOTE — Telephone Encounter (Signed)
Gave verbal ok to fill diabetic supplies (test strips, lancets, meds) for 90 day supply with three refills.Duran Ohern, Rodena Medinobert Lee

## 2013-08-04 ENCOUNTER — Telehealth: Payer: Self-pay | Admitting: Family Medicine

## 2013-08-04 NOTE — Telephone Encounter (Signed)
Patient dropped off form to be filled out for medical clearance.  Please fax to [phone] when completed.

## 2013-08-04 NOTE — Telephone Encounter (Signed)
Forms placed in Dr Alan RipperKuneff's box to be completed. Candice Warner, Virgel BouquetGiovanna S

## 2013-08-04 NOTE — Telephone Encounter (Signed)
Medical clearance for what? She had recent surgery on her shoulder, if it is for this, then she needs to get clearance from her surgeon and not her PCP. Thanks

## 2013-08-07 NOTE — Telephone Encounter (Signed)
Spoke with patient ans she stated that the clearance is for her diabetes and functioning at work. They are wanting to know how this dx is doing and what she can and can't do at work.

## 2013-08-08 ENCOUNTER — Telehealth: Payer: Self-pay | Admitting: *Deleted

## 2013-08-08 NOTE — Telephone Encounter (Signed)
Pt told form is completed and faxed to number provided.  Form will be placed up front for pt pick up.  Clovis PuMartin, Tamika L, RN

## 2013-08-08 NOTE — Telephone Encounter (Signed)
Please inform pt forms she requsted have been completed and faxed to the number provided. I have recommended she attempt low impact to non weight bearing exercise due to her Degenerative joints in her hips. Thanks.

## 2013-08-09 NOTE — Telephone Encounter (Signed)
Left message on patient's voicemail.Candice Warner S  

## 2013-09-19 ENCOUNTER — Ambulatory Visit (INDEPENDENT_AMBULATORY_CARE_PROVIDER_SITE_OTHER): Payer: BC Managed Care – PPO | Admitting: Family Medicine

## 2013-09-19 ENCOUNTER — Encounter: Payer: Self-pay | Admitting: Family Medicine

## 2013-09-19 VITALS — BP 119/73 | HR 74 | Temp 98.6°F | Wt 201.0 lb

## 2013-09-19 DIAGNOSIS — E119 Type 2 diabetes mellitus without complications: Secondary | ICD-10-CM

## 2013-09-19 DIAGNOSIS — A084 Viral intestinal infection, unspecified: Secondary | ICD-10-CM

## 2013-09-19 DIAGNOSIS — A088 Other specified intestinal infections: Secondary | ICD-10-CM

## 2013-09-19 LAB — POCT GLYCOSYLATED HEMOGLOBIN (HGB A1C): HEMOGLOBIN A1C: 6.9

## 2013-09-19 MED ORDER — BLOOD GLUCOSE MONITORING SUPPL W/DEVICE KIT: PACK | Status: DC

## 2013-09-19 MED ORDER — ONDANSETRON HCL 4 MG PO TABS: 4.0000 mg | ORAL_TABLET | Freq: Three times a day (TID) | ORAL | Status: DC | PRN

## 2013-09-19 NOTE — Progress Notes (Signed)
   Subjective:    [name], female    DOB: Oct 22, 1950, 63 y.o.   MRN: [id number]  HPI  Diarrhea and fatigue: Patient reports she has been increasingly fatigued and has experienced watery nonbloody diarrhea since Friday. She states that she has been becoming more weak and has been unable to work for the past few days. She has no recent travel or antibiotic use. She has not been around anybody that is sick to her knowledge. She also endorses lack of appetite but she has been able to tolerate food and water. Patient does not think she's had any fevers or chills. She denies vomit, cough, fever or headache. No myalgias.  Of note patient is interested in shingles vaccination.  Diabetes: Patient is due for her A1c today. She denies any hyper or hypoglycemic events. She takes metformin 1000 mg 2 times a day. She does not test her blood glucose daily. She is asking for a monitor to test her blood glucoses every morning.   Review of Systems Negative, with the exception of above mentioned in HPI     Objective:   Physical Exam BP 119/73  Pulse 74  Temp(Src) 98.6 F (37 C) (Oral)  Wt 201 lb (91.173 kg) Gen: Patient looks tired. No acute distress and nontoxic in appearance. HEENT: AT. [place]. Bilateral TM visualized and normal in appearance. Bilateral eyes without injections or icterus. Moderately dry mucous membranes. Bilateral nares mild erythema. Throat without erythema or exudates.  CV: RRR  Chest: CTAB, no wheeze or crackles Abd: Soft. NTND. BS present. No Masses palpated.  Ext: No erythema. No edema.  Skin: No rashes, purpura or petechiae.  Neuro:  Normal gait. PERLA. EOMi. Alert. Grossly intact.

## 2013-09-19 NOTE — Assessment & Plan Note (Addendum)
Patient with viral gastroenteritis for 4 days. Advised patient to follow a clear liquid diet for the next 24-48 hours, avoiding any type of foods with red dye. Advised patient to take at least 70 ounces of fluid, recommended 50-50 mixture of Gatorade and water. Advised patient to stay home from work for the next 48 hours, excuse given for work. Advised patient that she was not feeling better by Friday to make another appointment and or go to urgent care or ED. Will hold on shingles vaccination today, until patient is feeling better.

## 2013-09-19 NOTE — Patient Instructions (Addendum)
Viral Gastroenteritis Viral gastroenteritis is also known as stomach flu. This condition affects the stomach and intestinal tract. It can cause sudden diarrhea and vomiting. The illness typically lasts 3 to 8 days. Most people develop an immune response that eventually gets rid of the virus. While this natural response develops, the virus can make you quite ill. CAUSES  Many different viruses can cause gastroenteritis, such as rotavirus or noroviruses. You can catch one of these viruses by consuming contaminated food or water. You may also catch a virus by sharing utensils or other personal items with an infected person or by touching a contaminated surface. SYMPTOMS  The most common symptoms are diarrhea and vomiting. These problems can cause a severe loss of body fluids (dehydration) and a body salt (electrolyte) imbalance. Other symptoms may include:  Fever.  Headache.  Fatigue.  Abdominal pain. DIAGNOSIS  Your caregiver can usually diagnose viral gastroenteritis based on your symptoms and a physical exam. A stool sample may also be taken to test for the presence of viruses or other infections. TREATMENT  This illness typically goes away on its own. Treatments are aimed at rehydration. The most serious cases of viral gastroenteritis involve vomiting so severely that you are not able to keep fluids down. In these cases, fluids must be given through an intravenous line (IV). HOME CARE INSTRUCTIONS   Drink enough fluids to keep your urine clear or pale yellow. Drink small amounts of fluids frequently and increase the amounts as tolerated.  Ask your caregiver for specific rehydration instructions.  Avoid:  Foods high in sugar.  Alcohol.  Carbonated drinks.  Tobacco.  Juice.  Caffeine drinks.  Extremely hot or cold fluids.  Fatty, greasy foods.  Too much intake of anything at one time.  Dairy products until 24 to 48 hours after diarrhea stops.  You may consume probiotics.  Probiotics are active cultures of beneficial bacteria. They may lessen the amount and number of diarrheal stools in adults. Probiotics can be found in yogurt with active cultures and in supplements.  Wash your hands well to avoid spreading the virus.  Only take over-the-counter or prescription medicines for pain, discomfort, or fever as directed by your caregiver. Do not give aspirin to children. Antidiarrheal medicines are not recommended.  Ask your caregiver if you should continue to take your regular prescribed and over-the-counter medicines.  Keep all follow-up appointments as directed by your caregiver. SEEK IMMEDIATE MEDICAL CARE IF:   You are unable to keep fluids down.  You do not urinate at least once every 6 to 8 hours.  You develop shortness of breath.  You notice blood in your stool or vomit. This may look like coffee grounds.  You have abdominal pain that increases or is concentrated in one small area (localized).  You have persistent vomiting or diarrhea.  You have a fever.  The patient is a child younger than 3 months, and he or she has a fever.  The patient is a child older than 3 months, and he or she has a fever and persistent symptoms.  The patient is a child older than 3 months, and he or she has a fever and symptoms suddenly get worse.  The patient is a baby, and he or she has no tears when crying. MAKE SURE YOU:   Understand these instructions.  Will watch your condition.  Will get help right away if you are not doing well or get worse. Document Released: 06/01/2005 Document Revised: 08/24/2011 Document Reviewed: 03/18/2011   [employer] Patient Information 2014 [place], [place].  Try to drink at least 70 ounces of fluid a day.  Clear liquid diet for 24 hours

## 2013-09-19 NOTE — Assessment & Plan Note (Signed)
Patient with A1c is 6.9 today Will call in meter for her to check her blood sugars fasting every morning. Continue current therapy of 1000 mg of metformin twice a day. Followup in 3 months

## 2013-10-02 ENCOUNTER — Telehealth: Payer: Self-pay | Admitting: Family Medicine

## 2013-10-02 NOTE — Telephone Encounter (Signed)
Pt called and would like the doctor to call in her test strips to her pharmacy. jw

## 2013-10-02 NOTE — Telephone Encounter (Signed)
I will need to know what strips and monitor she is using to place this order. Please call pt back and ask her the name of the strips she is using. Thanks.

## 2013-10-02 NOTE — Telephone Encounter (Signed)
Left message for a return call.Grant Henkes S Ellenor Wisniewski  

## 2013-10-03 ENCOUNTER — Telehealth: Payer: Self-pay | Admitting: Family Medicine

## 2013-10-03 MED ORDER — GLUCOSE BLOOD VI STRP: ORAL_STRIP | Status: DC

## 2013-10-03 MED ORDER — ONETOUCH ULTRASOFT LANCETS MISC: Status: DC

## 2013-10-03 NOTE — Telephone Encounter (Signed)
Pt called to let the doctor know that she has the One touch ultra 2 and needs the strips to go with that monitor. jw

## 2013-10-03 NOTE — Telephone Encounter (Signed)
I have refilled her test strips and lancets. Thanks.

## 2013-10-26 ENCOUNTER — Other Ambulatory Visit: Payer: Self-pay | Admitting: Family Medicine

## 2013-10-27 ENCOUNTER — Encounter: Payer: Self-pay | Admitting: Family Medicine

## 2014-02-12 ENCOUNTER — Other Ambulatory Visit: Payer: Self-pay | Admitting: Family Medicine

## 2014-02-14 ENCOUNTER — Ambulatory Visit (INDEPENDENT_AMBULATORY_CARE_PROVIDER_SITE_OTHER): Payer: BC Managed Care – PPO | Admitting: Family Medicine

## 2014-02-14 ENCOUNTER — Encounter: Payer: Self-pay | Admitting: Family Medicine

## 2014-02-14 VITALS — BP 131/75 | HR 118 | Temp 98.1°F | Wt 197.0 lb

## 2014-02-14 DIAGNOSIS — R5383 Other fatigue: Secondary | ICD-10-CM

## 2014-02-14 DIAGNOSIS — R5381 Other malaise: Secondary | ICD-10-CM | POA: Diagnosis not present

## 2014-02-14 DIAGNOSIS — R252 Cramp and spasm: Secondary | ICD-10-CM | POA: Diagnosis not present

## 2014-02-14 LAB — CBC WITH DIFFERENTIAL/PLATELET
BASOS ABS: 0 10*3/uL (ref 0.0–0.1)
Basophils Relative: 0 % (ref 0–1)
Eosinophils Absolute: 0.1 10*3/uL (ref 0.0–0.7)
Eosinophils Relative: 1 % (ref 0–5)
HEMATOCRIT: 38 % (ref 36.0–46.0)
Hemoglobin: 12.8 g/dL (ref 12.0–15.0)
LYMPHS ABS: 3.2 10*3/uL (ref 0.7–4.0)
LYMPHS PCT: 57 % — AB (ref 12–46)
MCH: 28.9 pg (ref 26.0–34.0)
MCHC: 33.7 g/dL (ref 30.0–36.0)
MCV: 85.8 fL (ref 78.0–100.0)
Monocytes Absolute: 0.4 10*3/uL (ref 0.1–1.0)
Monocytes Relative: 7 % (ref 3–12)
NEUTROS ABS: 2 10*3/uL (ref 1.7–7.7)
Neutrophils Relative %: 35 % — ABNORMAL LOW (ref 43–77)
PLATELETS: 305 10*3/uL (ref 150–400)
RBC: 4.43 MIL/uL (ref 3.87–5.11)
RDW: 13.5 % (ref 11.5–15.5)
WBC: 5.6 10*3/uL (ref 4.0–10.5)

## 2014-02-14 LAB — BASIC METABOLIC PANEL
BUN: 17 mg/dL (ref 6–23)
CALCIUM: 9.4 mg/dL (ref 8.4–10.5)
CO2: 25 meq/L (ref 19–32)
Chloride: 99 mEq/L (ref 96–112)
Creat: 0.97 mg/dL (ref 0.50–1.10)
GLUCOSE: 144 mg/dL — AB (ref 70–99)
POTASSIUM: 3.8 meq/L (ref 3.5–5.3)
SODIUM: 138 meq/L (ref 135–145)

## 2014-02-14 LAB — TSH: TSH: 2.592 u[IU]/mL (ref 0.350–4.500)

## 2014-02-14 LAB — MAGNESIUM: MAGNESIUM: 1.6 mg/dL (ref 1.5–2.5)

## 2014-02-14 NOTE — Progress Notes (Signed)
Patient ID: NIOMIE ENGLERT, female   DOB: 10-18-1950, 63 y.o.   MRN: [id number] Subjective:   CC: "All over" cramping  HPI:   Pt is a 63 y.o. female with h/o DM, HTN, hypothyroidism, DJD of the hips, and depression here with all over cramping for 3 days. She also reports no energy chronically, mild subjective fevers yesterday causing her to go home from work, and dizziness with dyspnea yesterday that lasted about 4 hours and was nonexertional. She has not had symptoms like this in the past. She denies chest pain, cough, sneeze, congestion, fainting, changes in voiding or stooling, redness, swelling, night sweats, unintentional weight loss, nausea, or vomiting. She has not had any new medications/med changes. She eats one meal daily and snacks. She does not eat dinner. She sleeps after work around noon for a few hours and then does not sleep well at night. She is able to fall asleep but cannot stay asleep. She denies PND, orthopnea, or frequent urination at night. She does have the TV on in her bedroom when she falls asleep. Denies new stressors.  Review of Systems - Per HPI.   SH:  Works in [employer] Denies tobacco ever, alcohol, or drugs  Objective:  Physical Exam BP 131/75  Pulse 118  Temp(Src) 98.1 F (36.7 C) (Oral)  Wt 197 lb (89.359 kg) GEN: NAD CV: Mild tachycardia 110s, 2+ bilat radial pulses PULM: Normal effort, CTAB NEURO: Awake, alert, no focal deficits, normal gait and speech    Assessment:     JANEYA DEYO is a 63 y.o. female here for all-over cramping.    Plan:     Cramping No fevers or chills though she is mildly tachycardic here. No chest pain or exertional symptoms. One episode of dyspnea and dizziness yesterday that resolved spontaneously. DDx includes metabolic derangement like hypokalemia or hypomagnesemia, poor diet, anemia, hypothroidism, low vitmain D. No apparent neurologic compromise. - CBC, BMET, TSH, Vitamin D, Mg - Recommended improving PO and sleep  hygiene. - Could in the future consider sleep study and trazodone trial. - F/u 1-2 months.   Leona Singleton, MD [facility]

## 2014-02-14 NOTE — Patient Instructions (Signed)
We are getting labwork today. I will call you if anything is NOT normal. Seek immediate care if you have chest pain, trouble breathing, or new concerns. Work on eating three healthy meals and 2 healthy snacks and staying very hydrated daily. Work on sleep hygiene: Cut off the TV and let your body unwind 1 hour before bed, work on not sleeping heavily during the day. Follow up with Dr Claiborne Billings in 1-2 months.  Leona Singleton, MD

## 2014-02-15 ENCOUNTER — Telehealth: Payer: Self-pay | Admitting: Family Medicine

## 2014-02-15 LAB — VITAMIN D 25 HYDROXY (VIT D DEFICIENCY, FRACTURES): Vit D, 25-Hydroxy: 27 ng/mL — ABNORMAL LOW (ref 30–89)

## 2014-02-15 NOTE — Telephone Encounter (Signed)
Please call and let patient know her labs are all normal. Vitamin D is within 20-40ng/mL range, but she could potentially benefit from it being slightly higher. She can discuss supplementation with her PCP.   I think her fatigue is most likely related to her not eating enough meals and sleep hygiene. She should work on these and then can follow up to see how she is doing.  Thank you,  Leona Singleton, MD

## 2014-02-15 NOTE — Telephone Encounter (Signed)
LM for patient to call back.  Please give message from MD.  Thanks Jazmin Hartsell,CMA  

## 2014-02-19 NOTE — Assessment & Plan Note (Signed)
No fevers or chills though she is mildly tachycardic here. No chest pain or exertional symptoms. One episode of dyspnea and dizziness yesterday that resolved spontaneously. DDx includes metabolic derangement like hypokalemia or hypomagnesemia, poor diet, anemia, hypothroidism, low vitmain D. No apparent neurologic compromise. - CBC, BMET, TSH, Vitamin D, Mg - Recommended improving PO and sleep hygiene. - Could in the future consider sleep study and trazodone trial. - F/u 1-2 months.

## 2014-03-11 ENCOUNTER — Other Ambulatory Visit: Payer: Self-pay | Admitting: Family Medicine

## 2014-07-20 ENCOUNTER — Other Ambulatory Visit: Payer: Self-pay

## 2014-07-20 DIAGNOSIS — Z1231 Encounter for screening mammogram for malignant neoplasm of breast: Secondary | ICD-10-CM

## 2014-07-24 ENCOUNTER — Other Ambulatory Visit: Payer: Self-pay | Admitting: Family Medicine

## 2014-07-27 ENCOUNTER — Encounter (INDEPENDENT_AMBULATORY_CARE_PROVIDER_SITE_OTHER): Payer: Self-pay

## 2014-07-27 ENCOUNTER — Ambulatory Visit
Admission: RE | Admit: 2014-07-27 | Discharge: 2014-07-27 | Disposition: A | Discharge status: Home / Self Care | Payer: BLUE CROSS/BLUE SHIELD | Source: Ambulatory Visit

## 2014-07-27 DIAGNOSIS — Z1231 Encounter for screening mammogram for malignant neoplasm of breast: Secondary | ICD-10-CM

## 2014-08-27 ENCOUNTER — Encounter: Payer: Self-pay | Admitting: Family Medicine

## 2014-08-27 ENCOUNTER — Ambulatory Visit
Admission: RE | Admit: 2014-08-27 | Discharge: 2014-08-27 | Disposition: A | Discharge status: Home / Self Care | Payer: BLUE CROSS/BLUE SHIELD | Source: Ambulatory Visit | Attending: Family Medicine | Admitting: Family Medicine

## 2014-08-27 ENCOUNTER — Telehealth: Payer: Self-pay | Admitting: Family Medicine

## 2014-08-27 ENCOUNTER — Ambulatory Visit (INDEPENDENT_AMBULATORY_CARE_PROVIDER_SITE_OTHER): Payer: BLUE CROSS/BLUE SHIELD | Admitting: Family Medicine

## 2014-08-27 VITALS — BP 135/59 | HR 60 | Temp 98.3°F | Ht 66.5 in | Wt 195.8 lb

## 2014-08-27 DIAGNOSIS — G4700 Insomnia, unspecified: Secondary | ICD-10-CM | POA: Diagnosis not present

## 2014-08-27 DIAGNOSIS — M25552 Pain in left hip: Secondary | ICD-10-CM

## 2014-08-27 DIAGNOSIS — E119 Type 2 diabetes mellitus without complications: Secondary | ICD-10-CM | POA: Diagnosis not present

## 2014-08-27 DIAGNOSIS — Z23 Encounter for immunization: Secondary | ICD-10-CM

## 2014-08-27 DIAGNOSIS — Z0000 Encounter for general adult medical examination without abnormal findings: Secondary | ICD-10-CM

## 2014-08-27 LAB — POCT GLYCOSYLATED HEMOGLOBIN (HGB A1C): Hemoglobin A1C: 6.7

## 2014-08-27 MED ORDER — TRAZODONE HCL 50 MG PO TABS: 50.0000 mg | ORAL_TABLET | Freq: Every evening | ORAL | Status: DC | PRN

## 2014-08-27 MED ORDER — MELOXICAM 15 MG PO TABS: 15.0000 mg | ORAL_TABLET | Freq: Every day | ORAL | Status: DC

## 2014-08-27 NOTE — Telephone Encounter (Signed)
Please call Candice Warner, explained to her that her x-ray showed mild arthritis, that looks to be a little bit increased since her last x-ray in 2010. She should continue with the meloxicam daily as prescribed. We will discuss further when she comes in for follow-up on her insomnia in a few weeks. Thank you

## 2014-08-27 NOTE — Assessment & Plan Note (Signed)
Flu shot administered today Zostavax prescription given today for her to take to her pharmacist Colonoscopy due next year Mammogram up-to-date

## 2014-08-27 NOTE — Patient Instructions (Signed)
Insomnia Insomnia is frequent trouble falling and/or staying asleep. Insomnia can be a long term problem or a short term problem. Both are common. Insomnia can be a short term problem when the wakefulness is related to a certain stress or worry. Long term insomnia is often related to ongoing stress during waking hours and/or poor sleeping habits. Overtime, sleep deprivation itself can make the problem worse. Every little thing feels more severe because you are overtired and your ability to cope is decreased. CAUSES   Stress, anxiety, and depression.  Poor sleeping habits.  Distractions such as TV in the bedroom.  Naps close to bedtime.  Engaging in emotionally charged conversations before bed.  Technical reading before sleep.  Alcohol and other sedatives. They may make the problem worse. They can hurt normal sleep patterns and normal dream activity.  Stimulants such as caffeine for several hours prior to bedtime.  Pain syndromes and shortness of breath can cause insomnia.  Exercise late at night.  Changing time zones may cause sleeping problems (jet lag). It is sometimes helpful to have someone observe your sleeping patterns. They should look for periods of not breathing during the night (sleep apnea). They should also look to see how long those periods last. If you live alone or observers are uncertain, you can also be observed at a sleep clinic where your sleep patterns will be professionally monitored. Sleep apnea requires a checkup and treatment. Give your caregivers your medical history. Give your caregivers observations your family has made about your sleep.  SYMPTOMS   Not feeling rested in the morning.  Anxiety and restlessness at bedtime.  Difficulty falling and staying asleep. TREATMENT   Your caregiver may prescribe treatment for an underlying medical disorders. Your caregiver can give advice or help if you are using alcohol or other drugs for self-medication. Treatment  of underlying problems will usually eliminate insomnia problems.  Medications can be prescribed for short time use. They are generally not recommended for lengthy use.  Over-the-counter sleep medicines are not recommended for lengthy use. They can be habit forming.  You can promote easier sleeping by making lifestyle changes such as:  Using relaxation techniques that help with breathing and reduce muscle tension.  Exercising earlier in the day.  Changing your diet and the time of your last meal. No night time snacks.  Establish a regular time to go to bed.  Counseling can help with stressful problems and worry.  Soothing music and white noise may be helpful if there are background noises you cannot remove.  Stop tedious detailed work at least one hour before bedtime. HOME CARE INSTRUCTIONS   Keep a diary. Inform your caregiver about your progress. This includes any medication side effects. See your caregiver regularly. Take note of:  Times when you are asleep.  Times when you are awake during the night.  The quality of your sleep.  How you feel the next day. This information will help your caregiver care for you.  Get out of bed if you are still awake after 15 minutes. Read or do some quiet activity. Keep the lights down. Wait until you feel sleepy and go back to bed.  Keep regular sleeping and waking hours. Avoid naps.  Exercise regularly.  Avoid distractions at bedtime. Distractions include watching television or engaging in any intense or detailed activity like attempting to balance the household checkbook.  Develop a bedtime ritual. Keep a familiar routine of bathing, brushing your teeth, climbing into bed at the same   time each night, listening to soothing music. Routines increase the success of falling to sleep faster.  Use relaxation techniques. This can be using breathing and muscle tension release routines. It can also include visualizing peaceful scenes. You can  also help control troubling or intruding thoughts by keeping your mind occupied with boring or repetitive thoughts like the old concept of counting sheep. You can make it more creative like imagining planting one beautiful flower after another in your backyard garden.  During your day, work to eliminate stress. When this is not possible use some of the previous suggestions to help reduce the anxiety that accompanies stressful situations. MAKE SURE YOU:   Understand these instructions.  Will watch your condition.  Will get help right away if you are not doing well or get worse. Document Released: 05/29/2000 Document Revised: 08/24/2011 Document Reviewed: 06/29/2007 [facility] Patient Information 2015 [place], [place]. This information is not intended to replace advice given to you by your health care provider. Make sure you discuss any questions you have with your health care provider.  Arthritis, Nonspecific Arthritis is inflammation of a joint. This usually means pain, redness, warmth or swelling are present. One or more joints may be involved. There are a number of types of arthritis. Your caregiver may not be able to tell what type of arthritis you have right away. CAUSES  The most common cause of arthritis is the wear and tear on the joint (osteoarthritis). This causes damage to the cartilage, which can break down over time. The knees, hips, back and neck are most often affected by this type of arthritis. Other types of arthritis and common causes of joint pain include:  Sprains and other injuries near the joint. Sometimes minor sprains and injuries cause pain and swelling that develop hours later.  Rheumatoid arthritis. This affects hands, feet and knees. It usually affects both sides of your body at the same time. It is often associated with chronic ailments, fever, weight loss and general weakness.  Crystal arthritis. Gout and pseudo gout can cause occasional acute severe pain, redness and  swelling in the foot, ankle, or knee.  Infectious arthritis. Bacteria can get into a joint through a break in overlying skin. This can cause infection of the joint. Bacteria and viruses can also spread through the blood and affect your joints.  Drug, infectious and allergy reactions. Sometimes joints can become mildly painful and slightly swollen with these types of illnesses. SYMPTOMS   Pain is the main symptom.  Your joint or joints can also be red, swollen and warm or hot to the touch.  You may have a fever with certain types of arthritis, or even feel overall ill.  The joint with arthritis will hurt with movement. Stiffness is present with some types of arthritis. DIAGNOSIS  Your caregiver will suspect arthritis based on your description of your symptoms and on your exam. Testing may be needed to find the type of arthritis:  Blood and sometimes urine tests.  X-ray tests and sometimes CT or MRI scans.  Removal of fluid from the joint (arthrocentesis) is done to check for bacteria, crystals or other causes. Your caregiver (or a specialist) will numb the area over the joint with a local anesthetic, and use a needle to remove joint fluid for examination. This procedure is only minimally uncomfortable.  Even with these tests, your caregiver may not be able to tell what kind of arthritis you have. Consultation with a specialist (rheumatologist) may be helpful. TREATMENT  Your caregiver will discuss with you treatment specific to your type of arthritis. If the specific type cannot be determined, then the following general recommendations may apply. Treatment of severe joint pain includes:  Rest.  Elevation.  Anti-inflammatory medication (for example, ibuprofen) may be prescribed. Avoiding activities that cause increased pain.  Only take over-the-counter or prescription medicines for pain and discomfort as recommended by your caregiver.  Cold packs over an inflamed joint may be used  for 10 to 15 minutes every hour. Hot packs sometimes feel better, but do not use overnight. Do not use hot packs if you are diabetic without your caregiver's permission.  A cortisone shot into arthritic joints may help reduce pain and swelling.  Any acute arthritis that gets worse over the next 1 to 2 days needs to be looked at to be sure there is no joint infection. Long-term arthritis treatment involves modifying activities and lifestyle to reduce joint stress jarring. This can include weight loss. Also, exercise is needed to nourish the joint cartilage and remove waste. This helps keep the muscles around the joint strong. HOME CARE INSTRUCTIONS   Do not take aspirin to relieve pain if gout is suspected. This elevates uric acid levels.  Only take over-the-counter or prescription medicines for pain, discomfort or fever as directed by your caregiver.  Rest the joint as much as possible.  If your joint is swollen, keep it elevated.  Use crutches if the painful joint is in your leg.  Drinking plenty of fluids may help for certain types of arthritis.  Follow your caregiver's dietary instructions.  Try low-impact exercise such as:  Swimming.  Water aerobics.  Biking.  Walking.  Morning stiffness is often relieved by a warm shower.  Put your joints through regular range-of-motion. SEEK MEDICAL CARE IF:   You do not feel better in 24 hours or are getting worse.  You have side effects to medications, or are not getting better with treatment. SEEK IMMEDIATE MEDICAL CARE IF:   You have a fever.  You develop severe joint pain, swelling or redness.  Many joints are involved and become painful and swollen.  There is severe back pain and/or leg weakness.  You have loss of bowel or bladder control. Document Released: 07/09/2004 Document Revised: 08/24/2011 Document Reviewed: 07/25/2008 [facility] Patient Information 2015 [place]. This information is not intended to replace  advice given to you by your health care provider. Make sure you discuss any questions you have with your health care provider.  I would like to obtain x-rays of your hips today, please go to have this completed at your earliest convenience. I will call you once the results become available. I have prescribed you trazodone for you to take before bedtime so that you may follow sleep. Also included some information about on insomnia. I will call you in the locks a cam, for your hip pain, you are to take this every night. Follow-up in 2-4 weeks on hip pain and insomnia.

## 2014-08-27 NOTE — Assessment & Plan Note (Signed)
Will get x-rays of left hip today, likely increased arthritis, but patient with more pain than expected and decreased range of motion. Prescribe Modic for pain Will call patient with results, if needed orthopedics referral will be made

## 2014-08-27 NOTE — Progress Notes (Signed)
   Subjective:    [name], female    DOB: [birth year], 64 y.o.   MRN: [id number]  HPI  Left hip pain: Patient presents to family medicine clinic today with complaints of left hip pain has worsened over the past few weeks. Patient has a history of mild arthritis in the hip on x-ray in 2010. Patient states the pain is worse at night, she is unable to get to sleep secondary to the pain. The pain is better when she is up walking around. He denies any trauma or falls. He denies any clicks or catching in the hip. She started taking Tylenol, without benefit she moved onto Advil and now she is taking Aleve with only moderate benefit. He does not have sickle cell. He does not complain of any other joints being affected. She reports the pain in her hip radiates to her inner thigh.  Insomnia: Patient has been fighting insomnia for the past couple months. She states that she goes to bed at the same time every night, and wakes up just about every hour. She has trouble falling asleep as well as staying asleep. She denies any increased stress. She states she does have a TV but she does not watch TV or play on the phone/Internet  in her bedroom. She does not take naps throughout the day, although she feels like this is when she is her most tired.  Advance directives: She brings in her advanced directives forms today, and would like to have the choice is explained to her.   Health maintenance: Patient is due for her flu shot today she is amendable to receiving it. Patient is due for Zostavax be in over 60, and would like to have a prescription. Her colonoscopy is due next year 2017. Her mammogram has been completed and is normal.  Nonsmoker Past Medical History  Diagnosis Date  . Diabetes mellitus   . Hypertension   . Hyperlipidemia   . Lumbar back pain   . Radiculopathy   . Thyroid disease   . Depression   . Obesity   . Hypokalemia    No Known Allergies  Review of Systems Per history  of present illness    Objective:   Physical Exam BP 135/59 mmHg  Pulse 60  Temp(Src) 98.3 F (36.8 C) (Oral)  Ht 5' 6.5" (1.689 m)  Wt 195 lb 12.8 oz (88.814 kg)  BMI 31.13 kg/m2 Gen: NAD. Nontoxic in appearance, well-developed, well-nourished, very pleasant African-American female, mildly obese. Ext: No erythema. No edema. No swelling. No bruising. Tender to palpation anterior hip joint. Pain and decreased range of motion in flexion and internal rotation. Discomfort with external rotation and decreased range of motion. Positive Faber right hip, with right anterior hip pain. Negative straight leg raises. Neurovascularly intact distally.      Assessment & Plan:

## 2014-08-27 NOTE — Assessment & Plan Note (Addendum)
Patient suffering from insomnia, prescribed trazodone 50 mg daily at bedtime when necessary. Follow-up to 4 weeks

## 2014-08-28 NOTE — Telephone Encounter (Signed)
LVM for patient to call back to inform her of below 

## 2014-08-29 NOTE — Telephone Encounter (Signed)
Spoke with patient and informed her of below 

## 2014-10-23 ENCOUNTER — Ambulatory Visit (INDEPENDENT_AMBULATORY_CARE_PROVIDER_SITE_OTHER): Payer: BLUE CROSS/BLUE SHIELD | Admitting: Family Medicine

## 2014-10-23 ENCOUNTER — Encounter: Payer: Self-pay | Admitting: Family Medicine

## 2014-10-23 VITALS — BP 158/89 | HR 64 | Temp 98.2°F | Ht 66.5 in | Wt 199.0 lb

## 2014-10-23 DIAGNOSIS — M79644 Pain in right finger(s): Secondary | ICD-10-CM | POA: Diagnosis not present

## 2014-10-23 DIAGNOSIS — M6530 Trigger finger, unspecified finger: Secondary | ICD-10-CM | POA: Insufficient documentation

## 2014-10-23 MED ORDER — METHYLPREDNISOLONE ACETATE 40 MG/ML IJ SUSP
10.0000 mg | Freq: Once | INTRAMUSCULAR | Status: AC
  Administered 2014-10-23: 10 mg via INTRA_ARTICULAR

## 2014-10-23 NOTE — Progress Notes (Signed)
   Subjective:    Patient ID: Candice AdieLinda J Dicaprio, female    DOB: Sep 26, 1950, [age] y.o.   MRN: [id number]  HPI  Right Ring finger pain: Patient states she's had pain in her right ring finger for approximately 2 months. She reports that when she bends/flexes her fingers it was stick in position, and cause pain. She is able to retract this by extending the finger, without difficulty. She states at first it was just occasionally, now it's happening multiple times a day. She has difficulty holding objects in that hand, secondary to her finger sticking in a flexed position. She has no other fingers that are currently painful or sticking in flexed position. She is not taking any medications for this.  Never smoker Past Medical History  Diagnosis Date  . Diabetes mellitus   . Hypertension   . Hyperlipidemia   . Lumbar back pain   . Radiculopathy   . Thyroid disease   . Depression   . Obesity   . Hypokalemia    No Known Allergies Past Surgical History  Procedure Laterality Date  . Thyroid lobectomy      Goiter s/p right thyroidectomy  . Tubal ligation    . Cardiac catheterization  2010    No CAD  . Shoulder arthroscopy  2002    left  . Shoulder arthroscopy with rotator cuff repair and subacromial decompression  07/14/2012    Procedure: SHOULDER ARTHROSCOPY WITH ROTATOR CUFF REPAIR AND SUBACROMIAL DECOMPRESSION;  Surgeon: Loreta Aveaniel F Murphy, MD;  Location: [facility];  Service: Orthopedics;  Laterality: Right;  Right Shoulder Arthroscopy, Distal Claviculectomy, Subacromial Decompression, Partial Acromioplasty with Coracoacromial Release with Arthroscopic Rotator Cuff Repair    Review of Systems Per HPI    Objective:   Physical Exam BP 158/89 mmHg  Pulse 64  Temp(Src) 98.2 F (36.8 C) (Oral)  Ht 5' 6.5" (1.689 m)  Wt 199 lb (90.266 kg)  BMI 31.64 kg/m2 Gen: NAD. Nontoxic in appearance, well-developed, well-nourished, pleasant African-American female. Mildly obese. Ext: No  erythema. No edema. No soft tissue swelling. Normal range of motion of fingers and hand. Right ring finger remains in flexed position, with pain  Procedure Note: Procedure: Right trigger finger injection Indication: Trigger finger Physician: Dr. Paulina FusiHess  Consent obtained and verified. Sterile betadine prep. Further cleansed with alcohol. Ultrasound guided injection Joint: Digital flexor tendon injection right ring finger base Completed without difficulty Meds: 1/4 mL Depo-Medrol 40, 3/4 mL 1% lidocaine Needle: 25-gauge, 1 inch needle Aftercare instructions and Red flags advised.        Assessment & Plan:

## 2014-10-23 NOTE — Assessment & Plan Note (Signed)
Right ring trigger finger injection today under ultrasound by Dr. Paulina FusiHess, patient tolerated procedure well. Discussed course of treatment, and possible reinjection in 2-3 months if pain returns or does not improve. Discussed the possibility of surgical referral if it continues without improvement despite injections. Follow-up as needed

## 2014-10-23 NOTE — Patient Instructions (Signed)
You received a steroid injection in your finger today. This should help with the pain and inflammation. If you see no improvement, we can attempt in 12 weeks to inject it again. If still no improvement and you want it corrected we can consider surgical treatment.

## 2014-10-25 ENCOUNTER — Telehealth: Payer: Self-pay | Admitting: Family Medicine

## 2014-10-25 DIAGNOSIS — M6530 Trigger finger, unspecified finger: Secondary | ICD-10-CM

## 2014-10-25 NOTE — Telephone Encounter (Signed)
Wants brace for hand Shot made it worse Please advise

## 2014-10-26 NOTE — Telephone Encounter (Signed)
Please call pt, she called requesting a hand brace for her trigger finger. We had injected her trigger finger this past week. Please remind her that it will be more tender for a few days, as the steroid absorbs, then she should see a good deal of improvement. A hand or finger brace is not indicated, and would make her pain worse as well as her finger stiff. I know they are on the market, but this would only make her condition worse. I have placed a referral to a hand surgeon for her to discuss further options with them. Thanks.

## 2014-11-28 ENCOUNTER — Other Ambulatory Visit: Payer: Self-pay | Admitting: *Deleted

## 2014-11-28 DIAGNOSIS — G4700 Insomnia, unspecified: Secondary | ICD-10-CM

## 2014-11-28 MED ORDER — TRAZODONE HCL 50 MG PO TABS: 50.0000 mg | ORAL_TABLET | Freq: Every evening | ORAL | Status: DC | PRN

## 2015-02-19 ENCOUNTER — Other Ambulatory Visit: Payer: Self-pay | Admitting: *Deleted

## 2015-02-20 MED ORDER — LEVOTHYROXINE SODIUM 50 MCG PO TABS: 50.0000 ug | ORAL_TABLET | Freq: Every day | ORAL | Status: DC

## 2015-03-06 ENCOUNTER — Encounter: Payer: Self-pay | Admitting: Family Medicine

## 2015-03-06 ENCOUNTER — Ambulatory Visit (INDEPENDENT_AMBULATORY_CARE_PROVIDER_SITE_OTHER): Payer: BLUE CROSS/BLUE SHIELD | Admitting: Family Medicine

## 2015-03-06 VITALS — BP 159/52 | HR 64 | Temp 98.1°F | Ht 66.5 in | Wt 198.0 lb

## 2015-03-06 DIAGNOSIS — M6530 Trigger finger, unspecified finger: Secondary | ICD-10-CM

## 2015-03-06 NOTE — Patient Instructions (Signed)
Return if redness, swelling, drainage from injection site or any problems moving finger Follow up with Dr. Jordan Likes in a few weeks for diabetes and for your trigger finger  Be well, Dr. Pollie Meyer  Trigger Finger Trigger finger (digital tendinitis and stenosing tenosynovitis) is a common disorder that causes an often painful catching of the fingers or thumb. It occurs as a clicking, snapping, or locking of a finger in the palm of the hand. This is caused by a problem with the tendons that flex or bend the fingers sliding smoothly through their sheaths. The condition may occur in any finger or a couple fingers at the same time.  The finger may lock with the finger curled or suddenly straighten out with a snap. This is more common in patients with rheumatoid arthritis and diabetes. Left untreated, the condition may get worse to the point where the finger becomes locked in flexion, like making a fist, or less commonly locked with the finger straightened out. CAUSES   Inflammation and scarring that lead to swelling around the tendon sheath.  Repeated or forceful movements.  Rheumatoid arthritis, an autoimmune disease that affects joints.  Gout.  Diabetes mellitus. SIGNS AND SYMPTOMS  Soreness and swelling of your finger.  A painful clicking or snapping as you bend and straighten your finger. DIAGNOSIS  Your health care provider will do a physical exam of your finger to diagnose trigger finger. TREATMENT   Splinting for 6-8 weeks may be helpful.  Nonsteroidal anti-inflammatory medicines (NSAIDs) can help to relieve the pain and inflammation.  Cortisone injections, along with splinting, may speed up recovery. Several injections may be required. Cortisone may give relief after one injection.  Surgery is another treatment that may be used if conservative treatments do not work. Surgery can be minor, without incisions (a cut does not have to be made), and can be done with a needle through the  skin.  Other surgical choices involve an open procedure in which the surgeon opens the hand through a small incision and cuts the pulley so the tendon can again slide smoothly. Your hand will still work fine. HOME CARE INSTRUCTIONS  Apply ice to the injured area, twice per day:  Put ice in a plastic bag.  Place a towel between your skin and the bag.  Leave the ice on for 20 minutes, 3-4 times a day.  Rest your hand often. MAKE SURE YOU:   Understand these instructions.  Will watch your condition.  Will get help right away if you are not doing well or get worse. Document Released: 03/21/2004 Document Revised: 02/01/2013 Document Reviewed: 11/01/2012 [facility] Patient Information 2015 [place], [place]. This information is not intended to replace advice given to you by your health care provider. Make sure you discuss any questions you have with your health care provider.

## 2015-03-08 NOTE — Assessment & Plan Note (Signed)
Reviewed treatment options with patient today - including another injection versus referral to hand surgeon. As patient had good success with last injection, she elected for repeat injection today. This was performed, and patient tolerated procedure well. She was given post-care instructions. She will f/u with PCP in a few weeks to see how trigger finger is doing, and to f/u on her diabetes.

## 2015-03-08 NOTE — Progress Notes (Signed)
Date of Visit: 03/06/2015    HPI:  Pt presents for a same day appointment to discuss trigger finger.  Her R fourth finger has history of trigger finger. Had injection done back in May, which helped a lot. Symptoms have recently resumed. Gets locking of finger any time she bends it at the MCP joint.   ROS: See HPI  PMFSH: history of depression, diabetes, diverticulosis, HLD, hypertension, hypothyroidism, trigger finger  PHYSICAL EXAM: BP 159/52 mmHg  Pulse 64  Temp(Src) 98.1 F (36.7 C) (Oral)  Ht 5' 6.5" (1.689 m)  Wt 198 lb (89.812 kg)  BMI 31.48 kg/m2 Gen: NAD, pleasant, cooperative Ext: R hand atraumatic. Fourth finger actively triggers when MCP joint is flexed, requiring manual extension. Neurovascularly intact. 2+ radial pulse on R. No erythema or swelling.  PROCEDURE NOTE:  Trigger Finger Injection: After informed written consent was obtained, site of triggering identified on palmar aspect of R hand approximately 1.5 cm proximal to the 4th MCP joint. The area was prepped with betadine x 2 followed by alcohol swab. The tendon sheath was then injected with mixture of 1/10mL of depomedrol /mL and 1/6mL of 1% lidocaine without epinephrine. Area covered with bandaid. Patient tolerated the procedure well without immediate complications, and with some improvement in symptoms.  ASSESSMENT/PLAN:  Trigger finger, acquired Reviewed treatment options with patient today - including another injection versus referral to hand surgeon. As patient had good success with last injection, she elected for repeat injection today. This was performed, and patient tolerated procedure well. She was given post-care instructions. She will f/u with PCP in a few weeks to see how trigger finger is doing, and to f/u on her diabetes.    FOLLOW UP: F/u in few weeks with PCP for trigger finger & diabetes.  [place] J. Pollie Meyer, MD [facility]

## 2015-03-15 ENCOUNTER — Other Ambulatory Visit: Payer: Self-pay | Admitting: *Deleted

## 2015-03-15 MED ORDER — SERTRALINE HCL 100 MG PO TABS: 100.0000 mg | ORAL_TABLET | Freq: Every day | ORAL | Status: DC

## 2015-04-15 ENCOUNTER — Ambulatory Visit: Payer: BLUE CROSS/BLUE SHIELD | Admitting: Family Medicine

## 2015-04-22 ENCOUNTER — Other Ambulatory Visit: Payer: Self-pay | Admitting: *Deleted

## 2015-04-23 NOTE — Telephone Encounter (Signed)
2nd request.  Martin, Tamika L, RN  

## 2015-04-24 MED ORDER — SERTRALINE HCL 100 MG PO TABS: 100.0000 mg | ORAL_TABLET | Freq: Every day | ORAL | Status: DC

## 2015-04-26 ENCOUNTER — Encounter: Payer: Self-pay | Admitting: Family Medicine

## 2015-04-26 ENCOUNTER — Ambulatory Visit (INDEPENDENT_AMBULATORY_CARE_PROVIDER_SITE_OTHER): Payer: BLUE CROSS/BLUE SHIELD | Admitting: Family Medicine

## 2015-04-26 VITALS — BP 136/82 | HR 66 | Temp 98.2°F | Wt 199.8 lb

## 2015-04-26 DIAGNOSIS — E119 Type 2 diabetes mellitus without complications: Secondary | ICD-10-CM | POA: Diagnosis not present

## 2015-04-26 DIAGNOSIS — E039 Hypothyroidism, unspecified: Secondary | ICD-10-CM | POA: Diagnosis not present

## 2015-04-26 DIAGNOSIS — R300 Dysuria: Secondary | ICD-10-CM | POA: Diagnosis not present

## 2015-04-26 DIAGNOSIS — Z1159 Encounter for screening for other viral diseases: Secondary | ICD-10-CM

## 2015-04-26 LAB — POCT URINALYSIS DIPSTICK
Bilirubin, UA: NEGATIVE
GLUCOSE UA: NEGATIVE
Ketones, UA: NEGATIVE
Nitrite, UA: NEGATIVE
Protein, UA: 100
RBC UA: NEGATIVE
Spec Grav, UA: 1.025
UROBILINOGEN UA: 0.2
pH, UA: 7

## 2015-04-26 LAB — POCT GLYCOSYLATED HEMOGLOBIN (HGB A1C): Hemoglobin A1C: 6.8

## 2015-04-26 LAB — CBC
HEMATOCRIT: 37.8 % (ref 36.0–46.0)
HEMOGLOBIN: 12.4 g/dL (ref 12.0–15.0)
MCH: 28.5 pg (ref 26.0–34.0)
MCHC: 32.8 g/dL (ref 30.0–36.0)
MCV: 86.9 fL (ref 78.0–100.0)
MPV: 9.5 fL (ref 8.6–12.4)
Platelets: 298 10*3/uL (ref 150–400)
RBC: 4.35 MIL/uL (ref 3.87–5.11)
RDW: 13.7 % (ref 11.5–15.5)
WBC: 6.7 10*3/uL (ref 4.0–10.5)

## 2015-04-26 LAB — LIPID PANEL
Cholesterol: 197 mg/dL (ref 125–200)
HDL: 64 mg/dL (ref >=46)
LDL CALC: 69 mg/dL (ref <130)
Total CHOL/HDL Ratio: 3.1 Ratio (ref <=5.0)
Triglycerides: 318 mg/dL — ABNORMAL HIGH (ref <150)
VLDL: 64 mg/dL — AB (ref <30)

## 2015-04-26 LAB — BASIC METABOLIC PANEL WITH GFR
BUN: 19 mg/dL (ref 7–25)
CHLORIDE: 105 mmol/L (ref 98–110)
CO2: 27 mmol/L (ref 20–31)
Calcium: 9.8 mg/dL (ref 8.6–10.4)
Creat: 0.9 mg/dL (ref 0.50–0.99)
GFR, Est African American: 78 mL/min (ref >=60)
GFR, Est Non African American: 67 mL/min (ref >=60)
GLUCOSE: 110 mg/dL — AB (ref 65–99)
Potassium: 3.9 mmol/L (ref 3.5–5.3)
Sodium: 147 mmol/L — ABNORMAL HIGH (ref 135–146)

## 2015-04-26 LAB — POCT UA - MICROSCOPIC ONLY

## 2015-04-26 NOTE — Progress Notes (Signed)
Subjective:    Candice Warner - 64 y.o. female MRN [id number]  Date of birth: 10-Jul-1950  HPI  Candice Warner is here for DM2.   CHRONIC DIABETES  Disease Monitoring  Blood Sugar Ranges: none  Polyuria: no   Visual problems: no   Medication Compliance: yes  Medication Side Effects  Hypoglycemia: no   Preventitive Health Care  Eye Exam: due now   Foot Exam: today   Diet pattern: limits what she eats   Exercise: walking around at work   DYSURIA  Pain urinating started 30 days. Pain is: suprapubic pain Medications tried: no Any antibiotics in the last 30 days: no More than 3 UTIs in the last 12 months: no STD exposure: no Possibly pregnant: no  Symptoms Urgency: no Frequency: no Blood in urine: no Pain in back:no Fever: no Vaginal discharge: no Mouth Ulcers: no  Hypothyroidism:  Compliant with medications.  Currently asymptomatic.   Health Maintenance:  Health Maintenance Due  Topic Date Due  . COLONOSCOPY  02/23/2000  . ZOSTAVAX  02/22/2010  . OPHTHALMOLOGY EXAM  10/19/2014  . PAP SMEAR  11/30/2014  . INFLUENZA VACCINE  01/14/2015  . DEXA SCAN  02/23/2015  . PNA vac Low Risk Adult (1 of 2 - PCV13) 02/23/2015    -  reports that she has never smoked. She does not have any smokeless tobacco history on file. - Review of Systems: Per HPI. - Past Medical History: Patient Active Problem List   Diagnosis Date Noted  . Dysuria 04/27/2015  . Trigger finger, acquired 10/23/2014  . Left hip pain 08/27/2014  . Insomnia 08/27/2014  . BACK PAIN, LUMBAR, WITH RADICULOPATHY 09/26/2009  . HYPERLIPIDEMIA-MIXED 04/16/2009  . Diabetes mellitus without complication ([place]) 31/49/7026  . DEGENERATIVE JOINT DISEASE, HIPS 10/29/2008  . HYPOTHYROIDISM, UNSPECIFIED 08/12/2006  . OBESITY, NOS 08/12/2006  . DEPRESSION, MAJOR, RECURRENT 08/12/2006  . HYPERTENSION, BENIGN SYSTEMIC 08/12/2006  . DIVERTICULOSIS OF COLON 08/12/2006   - Medications: reviewed and  updated Current Outpatient Prescriptions  Medication Sig Dispense Refill  . aspirin (ASPIR-81) 81 MG EC tablet Take 81 mg by mouth daily.      . Blood Glucose Monitoring Suppl W/DEVICE KIT Test blood sugars every morning prior to eating or drinking. 1 each 0  . glucose blood (ONE TOUCH ULTRA TEST) test strip Check sugar 2 x daily 100 each 3  . KLOR-CON M20 20 MEQ tablet TAKE 1 TABLET BY MOUTH TWICE A DAY 120 tablet 1  . Lancets (ONETOUCH ULTRASOFT) lancets Use as instructed 100 each 6  . levothyroxine (SYNTHROID, LEVOTHROID) 50 MCG tablet Take 1 tablet (50 mcg total) by mouth daily. 90 tablet 1  . lisinopril-hydrochlorothiazide (PRINZIDE,ZESTORETIC) 20-25 MG per tablet TAKE 1 TABLET BY MOUTH DAILY. 90 tablet 3  . metFORMIN (GLUCOPHAGE) 500 MG tablet Take 2 tablets (1,000 mg total) by mouth 2 (two) times daily with a meal. 180 tablet 3   No current facility-administered medications for this visit.     Review of Systems See HPI     Objective:   Physical Exam BP 136/82 mmHg  Pulse 66  Temp(Src) 98.2 F (36.8 C) (Oral)  Wt 199 lb 12.8 oz (90.629 kg)  SpO2 98% Gen: NAD, alert, cooperative with exam, well-appearing CV: RRR, good S1/S2, no murmur, no edema,  Resp: CTABL, no wheezes, non-labored Skin: no rashes, normal turgor  Neuro: no gross deficits.  Psych: alert and oriented    Assessment & Plan:   Diabetes mellitus without complication ([place])  Controlled.  - continue current regimen   Dysuria No fevers or CVA pain.  Doubtful for infectious. Possible for interstitial cystitis  If not infectious and continues to occur then may need to try treating pain is still occurs. Could try elavil if concerned for interstitial cystitis.

## 2015-04-26 NOTE — Patient Instructions (Signed)
Thank you for coming in,   Please keep up the good work.   I will call or send a letter with results today.   Please bring all of your medications with you to each visit.   Sign up for My Chart to have easy access to your labs results, and communication with your Primary care physician   Please feel free to call with any questions or concerns at any time, at [phone]. --Dr. Jordan LikesSchmitz  Diet Recommendations for Diabetes   Starchy (carb) foods include: Bread, rice, pasta, potatoes, corn, crackers, bagels, muffins, all baked goods.   Protein foods include: Meat, fish, poultry, eggs, dairy foods, and beans such as pinto and kidney beans (beans also provide carbohydrate).   1. Eat at least 3 meals and 1-2 snacks per day. Never go more than 4-5 hours while awake without eating.  2. Limit starchy foods to TWO per meal and ONE per snack. ONE portion of a starchy  food is equal to the following:   - ONE slice of bread (or its equivalent, such as half of a hamburger bun).   - 1/2 cup of a "scoopable" starchy food such as potatoes or rice.   - 1 OUNCE (28 grams) of starchy snack foods such as crackers or pretzels (look on label).   - 15 grams of carbohydrate as shown on food label.  3. Both lunch and dinner should include a protein food, a carb food, and vegetables.   - Obtain twice as many veg's as protein or carbohydrate foods for both lunch and dinner.   - Try to keep frozen veg's on hand for a quick vegetable serving.     - Fresh or frozen veg's are best.  4. Breakfast should always include protein.

## 2015-04-27 DIAGNOSIS — R300 Dysuria: Secondary | ICD-10-CM | POA: Insufficient documentation

## 2015-04-27 LAB — MICROALBUMIN, URINE: Microalb, Ur: 35.1 mg/dL

## 2015-04-27 LAB — TSH: TSH: 3.242 u[IU]/mL (ref 0.350–4.500)

## 2015-04-27 LAB — HEPATITIS C ANTIBODY: HCV AB: NEGATIVE

## 2015-04-27 NOTE — Assessment & Plan Note (Signed)
Stable  - continue current med

## 2015-04-27 NOTE — Assessment & Plan Note (Signed)
No fevers or CVA pain.  Doubtful for infectious. Possible for interstitial cystitis  If not infectious and continues to occur then may need to try treating pain is still occurs. Could try elavil if concerned for interstitial cystitis.

## 2015-04-27 NOTE — Assessment & Plan Note (Signed)
Controlled- continue current regimen.  

## 2015-04-29 ENCOUNTER — Encounter: Payer: Self-pay | Admitting: Family Medicine

## 2015-04-30 ENCOUNTER — Other Ambulatory Visit: Payer: Self-pay | Admitting: *Deleted

## 2015-04-30 MED ORDER — LISINOPRIL-HYDROCHLOROTHIAZIDE 20-25 MG PO TABS: 1.0000 | ORAL_TABLET | Freq: Every day | ORAL | Status: DC

## 2015-05-16 ENCOUNTER — Other Ambulatory Visit: Payer: Self-pay | Admitting: *Deleted

## 2015-05-22 ENCOUNTER — Telehealth: Payer: Self-pay | Admitting: Family Medicine

## 2015-05-22 NOTE — Telephone Encounter (Signed)
Pt asking why sertraline prescription was denied, goes to [facility]

## 2015-05-23 NOTE — Telephone Encounter (Signed)
Left VM for patient. If she calls back please have her speak with a nurse/CMA and inform that she hasn't been seen for depression for some time and sertraline was not on her active medication list.  I can refill it if she plan on following up with me.   If any questions then please take the best time and phone number to call and I will try to call her back.   Myra RudeJeremy E Albi Rappaport, MD PGY-3, [facility] 05/23/2015, 1:57 PM

## 2015-05-23 NOTE — Telephone Encounter (Signed)
Pt returned call. I informed her of info below. Had a refill in September and November. She asked if Dr. Eulah CitizenWould refill for 90 days. She will make an appt to meet with Dr. And go over all meds. Sunday SpillersSharon T Aashvi Rezabek, CMA

## 2015-05-28 MED ORDER — SERTRALINE HCL 100 MG PO TABS: 100.0000 mg | ORAL_TABLET | Freq: Every day | ORAL | Status: DC

## 2015-05-28 NOTE — Telephone Encounter (Signed)
zoloft sent in and she will make follow up appt.   Myra RudeJeremy E Jolene Guyett, MD PGY-3, [facility] 05/28/2015, 11:44 AM

## 2015-06-21 ENCOUNTER — Ambulatory Visit (INDEPENDENT_AMBULATORY_CARE_PROVIDER_SITE_OTHER): Payer: BLUE CROSS/BLUE SHIELD | Admitting: Family Medicine

## 2015-06-21 ENCOUNTER — Encounter: Payer: Self-pay | Admitting: Family Medicine

## 2015-06-21 VITALS — BP 120/67 | HR 69 | Temp 98.8°F | Wt 193.0 lb

## 2015-06-21 DIAGNOSIS — I10 Essential (primary) hypertension: Secondary | ICD-10-CM | POA: Diagnosis not present

## 2015-06-21 DIAGNOSIS — R222 Localized swelling, mass and lump, trunk: Secondary | ICD-10-CM | POA: Diagnosis not present

## 2015-06-21 DIAGNOSIS — Z23 Encounter for immunization: Secondary | ICD-10-CM

## 2015-06-21 DIAGNOSIS — M25562 Pain in left knee: Secondary | ICD-10-CM

## 2015-06-21 DIAGNOSIS — M25561 Pain in right knee: Secondary | ICD-10-CM | POA: Insufficient documentation

## 2015-06-21 NOTE — Progress Notes (Signed)
Subjective:    Candice Warner - 65 y.o. female MRN [id number]  Date of birth: 1950/10/19  HPI  Candice Warner is here for left knee pain.  Left knee pain  Location: left anterior knee  Pain started: she feel about two weeks ago and is now painful Pain radiates proximally to his left thigh   Pain is: worse when she is extending her left knee.  Severity: severe  Medications tried: aleve but with no help  Recent trauma: yes  Similar pain previously: no Left hip x-ray from 2009 showing spurring changes with my interpretation but good joint space   Symptoms Redness:no Swelling: yes  Fever: no Weakness: no Weight loss: no Rash: no  Depression:  Reports that she has been on zoloft since 2002  Mood is controlled with medication  Reports symptoms re-occur when not taking medication  No prior thoughts of SI or HI  No prior history of prior in patient treatment  She lost her son and husband in 2002 back to back.   Chest Nodule:  Still occurring on his left sternoclavicular region  CT scan from 2010 didn't not demonstrate any findings.  Left clavicular x-ray showing degernative changes of left sternoclavicular joint  Does not interfere with her daily activities  Not painful and doesn't cause any trouble swallowing or trouble breathing.  Has maintained weight and no nights sweats.   HTN Disease Monitoring: Home BP Monitoring none  Medications:Lisinopril, hydrochlorothiazide Chest pain- non     Dyspnea- no Compliance-  yes.  Lightheadedness-  no   Edema- no   No history of smoking   Health Maintenance:  - has ophthalmologist but pending finances  - Colonoscopy will be sent a letter  - flu shot today  Health Maintenance Due  Topic Date Due  . COLONOSCOPY  02/23/2000  . ZOSTAVAX  02/22/2010  . OPHTHALMOLOGY EXAM  10/19/2014  . PAP SMEAR  11/30/2014  . INFLUENZA VACCINE  01/14/2015  . DEXA SCAN  02/23/2015  . PNA vac Low Risk Adult (1 of 2 - PCV13) 02/23/2015     -  reports that she has never smoked. She does not have any smokeless tobacco history on file. - Review of Systems: Per HPI. - Past Medical History: Patient Active Problem List   Diagnosis Date Noted  . Nodule of chest wall 06/21/2015  . Left knee pain 06/21/2015  . Dysuria 04/27/2015  . Trigger finger, acquired 10/23/2014  . Left hip pain 08/27/2014  . Insomnia 08/27/2014  . BACK PAIN, LUMBAR, WITH RADICULOPATHY 09/26/2009  . HYPERLIPIDEMIA-MIXED 04/16/2009  . Diabetes mellitus without complication ([place]) 38/03/1750  . DEGENERATIVE JOINT DISEASE, HIPS 10/29/2008  . Hypothyroidism 08/12/2006  . OBESITY, NOS 08/12/2006  . Major depressive disorder, recurrent episode ([place]) 08/12/2006  . HYPERTENSION, BENIGN SYSTEMIC 08/12/2006  . DIVERTICULOSIS OF COLON 08/12/2006   - Medications: reviewed and updated Current Outpatient Prescriptions  Medication Sig Dispense Refill  . aspirin (ASPIR-81) 81 MG EC tablet Take 81 mg by mouth daily.      . Blood Glucose Monitoring Suppl W/DEVICE KIT Test blood sugars every morning prior to eating or drinking. 1 each 0  . glucose blood (ONE TOUCH ULTRA TEST) test strip Check sugar 2 x daily 100 each 3  . KLOR-CON M20 20 MEQ tablet TAKE 1 TABLET BY MOUTH TWICE A DAY 120 tablet 1  . Lancets (ONETOUCH ULTRASOFT) lancets Use as instructed 100 each 6  . levothyroxine (SYNTHROID, LEVOTHROID) 50 MCG tablet Take 1 tablet (  50 mcg total) by mouth daily. 90 tablet 1  . lisinopril-hydrochlorothiazide (PRINZIDE,ZESTORETIC) 20-25 MG tablet Take 1 tablet by mouth daily. 90 tablet 1  . metFORMIN (GLUCOPHAGE) 500 MG tablet Take 2 tablets (1,000 mg total) by mouth 2 (two) times daily with a meal. 180 tablet 3  . sertraline (ZOLOFT) 100 MG tablet Take 1 tablet (100 mg total) by mouth daily. 30 tablet 3   No current facility-administered medications for this visit.     Review of Systems See HPI     Objective:   Physical Exam BP 120/67 mmHg  Pulse 69  Temp(Src)  98.8 F (37.1 C) (Oral)  Wt 193 lb (87.544 kg) Gen: NAD, alert, cooperative with exam,  HEENT: NCAT, extraocular movements intact clear conjunctiva, supple neck CV: Regular rate, no edema, capillary refill brisk  Resp:  non-labored Skin: no rashes, normal turgor  Neuro: no gross deficits.  Psych: alert and oriented Knee Exam:  Laterality: left Appearance: No obvious defect Edema: Minimal swelling  Tenderness: Tender to palpation of the anterior aspect of the patella. Range of Motion: Normal active and passive range of motion. Pain is worse with extension of the left knee. Normal plantar and dorsal flexion. Normal internal and external Rotation of left hip Maneuvers: Lachman's: Negative McMurray's: Negative Patellar Compression: Pain upon palpation Strength:  Quadricep: 5/5 Hamstring: 5/5 Gait: normal Neurovascularly intact      Assessment & Plan:   HYPERTENSION, BENIGN SYSTEMIC Well controlled  - Continue current regimen  Nodule of chest wall Nodule is occurring on the left sternoclavicular joint She denies any night sweats or loss of weight and causes her no problems This was observed in 2010 CT from 2010 did not demonstrate any findings and left clavicular x-ray showing degenerative changes of the left sternoclavicular joint - Continue to monitor  Major depressive disorder, recurrent episode ([place]) Has been occurring since 2002 and relation to the loss of her loved ones - Reports good symptom control with medication - Continue current medication  Left knee pain Is related to her falling onto her left knee. She denies any loss of consciousness Most likely a bone bruise versus course tendon strain No obvious swelling to suggest meniscal injury or patellar dislocation - Obtain x-rays of left knee - Advised Tylenol for pain and also topical rub - Provided home modalities sheet - If no improvement in 4 weeks May need to consider referral to sports medicine for  diagnostic ultrasound

## 2015-06-21 NOTE — Assessment & Plan Note (Signed)
Nodule is occurring on the left sternoclavicular joint She denies any night sweats or loss of weight and causes her no problems This was observed in 2010 CT from 2010 did not demonstrate any findings and left clavicular x-ray showing degenerative changes of the left sternoclavicular joint - Continue to monitor

## 2015-06-21 NOTE — Patient Instructions (Signed)
Thank you for coming in,   Please go to [facility] and go to the radiology department on the first floor. He do not need an appointment and he can have x-rays done at any time.  Please take Tylenol for your pain of your left knee or you can rub on Aspercreme.  Continue to use ice and also moves the knee to keep her from locking up.  Please bring all of your medications with you to each visit.   Sign up for My Chart to have easy access to your labs results, and communication with your Primary care physician   Please feel free to call with any questions or concerns at any time, at [phone]. --Dr. Lonell FaceSchmitz RICE for Routine Care of Injuries Theroutine careofmanyinjuriesincludes rest, ice, compression, and elevation (RICE therapy). RICE therapy is often recommended for injuries to soft tissues, such as a muscle strain, ligament injuries, bruises, and overuse injuries. It can also be used for some bony injuries. Using RICE therapy can help to relieve pain, lessen swelling, and enable your body to heal. Rest Rest is required to allow your body to heal. This usually involves reducing your normal activities and avoiding use of the injured part of your body. Generally, you can return to your normal activities when you are comfortable and have been given permission by your health care provider. Ice Icing your injury helps to keep the swelling down, and it lessens pain. Do not apply ice directly to your skin.  Put ice in a plastic bag.  Place a towel between your skin and the bag.  Leave the ice on for 20 minutes, 2-3 times a day. Do this for as long as you are directed by your health care provider. Compression Compression means putting pressure on the injured area. Compression helps to keep swelling down, gives support, and helps with discomfort. Compression may be done with an elastic bandage. If an elastic bandage has been applied, follow these general tips:  Remove and reapply the  bandage every 3-4 hours or as directed by your health care provider.  Make sure the bandage is not wrapped too tightly, because this can cut off circulation. If part of your body beyond the bandage becomes blue, numb, cold, swollen, or more painful, your bandage is most likely too tight. If this occurs, remove your bandage and reapply it more loosely.  See your health care provider if the bandage seems to be making your problems worse rather than better. Elevation Elevation means keeping the injured area raised. This helps to lessen swelling and decrease pain. If possible, your injured area should be elevated at or above the level of your heart or the center of your chest. WHEN SHOULD I SEEK MEDICAL CARE? You should seek medical care if:  Your pain and swelling continue.  Your symptoms are getting worse rather than improving. These symptoms may indicate that further evaluation or further X-rays are needed. Sometimes, X-rays may not show a small broken bone (fracture) until a number of days later. Make a follow-up appointment with your health care provider. WHEN SHOULD I SEEK IMMEDIATE MEDICAL CARE? You should seek immediate medical care if:  You have sudden severe pain at or below the area of your injury.  You have redness or increased swelling around your injury.  You have tingling or numbness at or below the area of your injury that does not improve after you remove the elastic bandage.   This information is not intended to replace advice given  to you by your health care provider. Make sure you discuss any questions you have with your health care provider.   Document Released: 09/13/2000 Document Revised: 02/20/2015 Document Reviewed: 05/09/2014 Elsevier Interactive Patient Education [employer]

## 2015-06-21 NOTE — Assessment & Plan Note (Signed)
Is related to her falling onto her left knee. She denies any loss of consciousness Most likely a bone bruise versus course tendon strain No obvious swelling to suggest meniscal injury or patellar dislocation - Obtain x-rays of left knee - Advised Tylenol for pain and also topical rub - Provided home modalities sheet - If no improvement in 4 weeks May need to consider referral to sports medicine for diagnostic ultrasound

## 2015-06-21 NOTE — Assessment & Plan Note (Signed)
Has been occurring since 2002 and relation to the loss of her loved ones - Reports good symptom control with medication - Continue current medication

## 2015-06-21 NOTE — Assessment & Plan Note (Signed)
Well-controlled.  Continue current regimen. 

## 2015-06-26 ENCOUNTER — Ambulatory Visit (HOSPITAL_COMMUNITY)
Admission: RE | Admit: 2015-06-26 | Discharge: 2015-06-26 | Disposition: A | Discharge status: Home / Self Care | Payer: BLUE CROSS/BLUE SHIELD | Source: Ambulatory Visit | Attending: Family Medicine | Admitting: Family Medicine

## 2015-06-26 DIAGNOSIS — M25562 Pain in left knee: Secondary | ICD-10-CM | POA: Diagnosis not present

## 2015-07-01 ENCOUNTER — Encounter: Payer: Self-pay | Admitting: Family Medicine

## 2015-07-04 ENCOUNTER — Telehealth: Payer: Self-pay | Admitting: Family Medicine

## 2015-07-04 ENCOUNTER — Other Ambulatory Visit: Payer: Self-pay

## 2015-07-04 DIAGNOSIS — Z1231 Encounter for screening mammogram for malignant neoplasm of breast: Secondary | ICD-10-CM

## 2015-07-04 NOTE — Telephone Encounter (Signed)
Pt calling to retrieve results. Candice Warner, ASA ° °

## 2015-07-10 NOTE — Telephone Encounter (Signed)
Left VM for patient. If she calls back please have her speak with a nurse/CMA and inform that her knee x-rays were normal.   If any questions then please take the best time and phone number to call and I will try to call her back.   Myra Rude, MD PGY-3, [facility] 07/10/2015, 7:53 AM

## 2015-07-11 NOTE — Telephone Encounter (Signed)
Patient informed of message from MD, states knee is still bothering her. Would like to speak with MD.

## 2015-07-16 NOTE — Telephone Encounter (Signed)
Left VM for patient. If she calls back please have her speak with a nurse/CMA and inform that if her knee is still hurting then we can either try a strong anti-inflammatory or an injection into the knee or we can try physical therapy or we can wait. It should get better over time as it will take about 6 to 8 weeks for her knee to get back to normal.    If any questions then please take the best time and phone number to call and I will try to call her back.   Myra Rude, MD PGY-3, [facility] 07/16/2015, 8:35 AM

## 2015-07-17 NOTE — Telephone Encounter (Signed)
Patient informed of message from MD, will wait for now and schedule an appointment if knee gets worse.

## 2015-08-02 ENCOUNTER — Ambulatory Visit
Admission: RE | Admit: 2015-08-02 | Discharge: 2015-08-02 | Disposition: A | Discharge status: Home / Self Care | Payer: BLUE CROSS/BLUE SHIELD | Source: Ambulatory Visit

## 2015-08-02 DIAGNOSIS — Z1231 Encounter for screening mammogram for malignant neoplasm of breast: Secondary | ICD-10-CM

## 2015-08-19 ENCOUNTER — Other Ambulatory Visit: Payer: Self-pay | Admitting: *Deleted

## 2015-08-19 MED ORDER — LEVOTHYROXINE SODIUM 50 MCG PO TABS: 50.0000 ug | ORAL_TABLET | Freq: Every day | ORAL | Status: DC

## 2015-08-27 ENCOUNTER — Telehealth: Payer: Self-pay | Admitting: Family Medicine

## 2015-08-27 DIAGNOSIS — E118 Type 2 diabetes mellitus with unspecified complications: Secondary | ICD-10-CM

## 2015-08-27 NOTE — Telephone Encounter (Signed)
Pt called and needs a refill on her Metformin called in. jw °

## 2015-08-28 MED ORDER — METFORMIN HCL 500 MG PO TABS: 1000.0000 mg | ORAL_TABLET | Freq: Two times a day (BID) | ORAL | Status: DC

## 2015-08-28 NOTE — Telephone Encounter (Signed)
Pharmacist with [facility] called to request 90 day supply of the metformin.  Clovis PuMartin, Johniya Durfee L, RN

## 2015-09-04 ENCOUNTER — Other Ambulatory Visit: Payer: Self-pay | Admitting: *Deleted

## 2015-09-04 MED ORDER — SERTRALINE HCL 100 MG PO TABS: 100.0000 mg | ORAL_TABLET | Freq: Every day | ORAL | Status: DC

## 2015-09-04 NOTE — Telephone Encounter (Signed)
pts insurance will only pay for 90 day supply of metformin. Rx was sent on 08-28-15 for 30 day supply.  Pt would like for dr schmitz to correct the prescription and resend it

## 2015-09-09 MED ORDER — METFORMIN HCL 500 MG PO TABS: 1000.0000 mg | ORAL_TABLET | Freq: Two times a day (BID) | ORAL | Status: DC

## 2015-09-09 NOTE — Addendum Note (Signed)
Addended by: Myra RudeSCHMITZ, Maxximus Gotay E on: 09/09/2015 08:48 AM   Modules accepted: Orders

## 2015-09-09 NOTE — Telephone Encounter (Signed)
90 day supply sent due to insurance.   Candice RudeJeremy E Schmitz, MD PGY-3, [facility] 09/09/2015, 8:48 AM

## 2015-09-13 ENCOUNTER — Other Ambulatory Visit (HOSPITAL_COMMUNITY)
Admission: RE | Admit: 2015-09-13 | Discharge: 2015-09-13 | Disposition: A | Discharge status: Home / Self Care | Payer: BLUE CROSS/BLUE SHIELD | Source: Ambulatory Visit | Attending: Family Medicine | Admitting: Family Medicine

## 2015-09-13 ENCOUNTER — Encounter: Payer: Self-pay | Admitting: Family Medicine

## 2015-09-13 ENCOUNTER — Ambulatory Visit (INDEPENDENT_AMBULATORY_CARE_PROVIDER_SITE_OTHER): Payer: BLUE CROSS/BLUE SHIELD | Admitting: Family Medicine

## 2015-09-13 VITALS — BP 113/52 | HR 63 | Temp 98.1°F | Ht 67.0 in

## 2015-09-13 DIAGNOSIS — Z1151 Encounter for screening for human papillomavirus (HPV): Secondary | ICD-10-CM | POA: Insufficient documentation

## 2015-09-13 DIAGNOSIS — E119 Type 2 diabetes mellitus without complications: Secondary | ICD-10-CM | POA: Diagnosis not present

## 2015-09-13 DIAGNOSIS — Z01419 Encounter for gynecological examination (general) (routine) without abnormal findings: Secondary | ICD-10-CM | POA: Diagnosis present

## 2015-09-13 DIAGNOSIS — Z124 Encounter for screening for malignant neoplasm of cervix: Secondary | ICD-10-CM

## 2015-09-13 DIAGNOSIS — E785 Hyperlipidemia, unspecified: Secondary | ICD-10-CM | POA: Diagnosis not present

## 2015-09-13 LAB — POCT GLYCOSYLATED HEMOGLOBIN (HGB A1C): Hemoglobin A1C: 6.5

## 2015-09-13 NOTE — Patient Instructions (Signed)
Thank you for coming in,   I will call you in regards to what I find out about the surgery.   Please bring all of your medications with you to each visit.   Sign up for My Chart to have easy access to your labs results, and communication with your Primary care physician   Please feel free to call with any questions or concerns at any time, at [phone]. --Dr. Jordan LikesSchmitz

## 2015-09-13 NOTE — Progress Notes (Signed)
   Subjective:    Candice Warner - 65 y.o. female MRN [id number]  Date of birth: [birth year]  CC Dm2  HPI  Candice Warner is here for Dm2.  CHRONIC DIABETES  Disease Monitoring  Blood Sugar Ranges: none  Polyuria: no   Visual problems: no   Medication Compliance: yes  Medication Side Effects  Hypoglycemia: no   Preventitive Health Care  Eye Exam: is to follow up soon  Foot Exam: completed   HYPERLIPIDEMIA Symptoms Chest pain on exertion:  No    . Leg claudication:   no Medications: not currently taking medication  Compliance- n/a  Right upper quadrant pain- no   Muscle aches- no     Component Value Date/Time   CHOL 197 04/26/2015 1648   TRIG 318* 04/26/2015 1648   HDL 64 04/26/2015 1648   LDLDIRECT 87 09/26/2009 2141   VLDL 64* 04/26/2015 1648   CHOLHDL 3.1 04/26/2015 1648    Health Maintenance:  - Pap smear today  Health Maintenance Due  Topic Date Due  . COLONOSCOPY  02/23/2000  . ZOSTAVAX  02/22/2010  . OPHTHALMOLOGY EXAM  10/19/2014  . PAP SMEAR  11/30/2014  . DEXA SCAN  02/23/2015  . PNA vac Low Risk Adult (1 of 2 - PCV13) 02/23/2015    Review of Systems See HPI     Objective:   Physical Exam BP 113/52 mmHg  Pulse 63  Temp(Src) 98.1 F (36.7 C) (Oral)  Ht 5\' 7"  (1.702 m) Gen: NAD, alert, cooperative with exam, well-appearing HEENT: NCAT, PERRL, clear conjunctiva, oropharynx clear, supple neck CV: RRR, good S1/S2, no murmur, no edema, capillary refill brisk  Resp: CTABL, no wheezes, non-labored Abd: SNTND, BS present, no guarding or organomegaly Skin: no rashes, normal turgor  Neuro: no gross deficits.  Psych: good insight, alert and oriented        Assessment & Plan:   No problem-specific assessment & plan notes found for this encounter.

## 2015-09-14 NOTE — Assessment & Plan Note (Signed)
Controlled  - continue current medication 

## 2015-09-14 NOTE — Assessment & Plan Note (Signed)
She is not currently taking a statin  - she would benefit from one  - will discuss this with her going forward

## 2015-09-17 LAB — CYTOLOGY - PAP

## 2015-09-19 ENCOUNTER — Encounter: Payer: Self-pay | Admitting: Family Medicine

## 2015-10-07 ENCOUNTER — Telehealth: Payer: Self-pay | Admitting: Family Medicine

## 2015-10-07 DIAGNOSIS — M25562 Pain in left knee: Secondary | ICD-10-CM

## 2015-10-07 NOTE — Telephone Encounter (Signed)
Pt called because on her last visit she was told that the doctor was going to put in a referral to Sports Medicine for her. She has not heard anything and was checking to see how much longer this will be. jw

## 2015-10-08 NOTE — Telephone Encounter (Signed)
Referred to sports medicine for left knee pain

## 2015-10-08 NOTE — Telephone Encounter (Signed)
Spoke to pt. She said that on her last visit she and Dr Jordan LikesSchmitz discussed her trigger finger and he was going to send her to Sports Medicine. Her knee is better so she doesn't need to see Sports Med for that. Please advise. Sunday SpillersSharon T Kellen Dutch, CMA

## 2015-10-10 ENCOUNTER — Other Ambulatory Visit: Payer: Self-pay | Admitting: Family Medicine

## 2015-10-10 DIAGNOSIS — M6530 Trigger finger, unspecified finger: Secondary | ICD-10-CM

## 2015-10-10 NOTE — Telephone Encounter (Signed)
Referral for trigger finger has been placed.

## 2015-10-11 NOTE — Telephone Encounter (Signed)
Called LVM for pt to call. If pt calls, please inform her of below. Sunday SpillersSharon T Aldean Suddeth, CMA

## 2015-10-14 NOTE — Telephone Encounter (Signed)
Patient is aware that referral has been placed. Etienne Millward,CMA  

## 2015-10-15 ENCOUNTER — Encounter: Payer: Self-pay | Admitting: Sports Medicine

## 2015-10-15 ENCOUNTER — Ambulatory Visit (INDEPENDENT_AMBULATORY_CARE_PROVIDER_SITE_OTHER): Payer: BLUE CROSS/BLUE SHIELD | Admitting: Sports Medicine

## 2015-10-15 VITALS — BP 124/51 | Ht 66.5 in | Wt 192.0 lb

## 2015-10-15 DIAGNOSIS — M6530 Trigger finger, unspecified finger: Secondary | ICD-10-CM

## 2015-10-15 NOTE — Patient Instructions (Signed)
Continue to use the bandaids over the 1st knuckle to help prevent the locking. We have referred you to Dr. Eulah PontMurphy for surgery.  Trigger Finger Trigger finger (digital tendinitis and stenosing tenosynovitis) is a common disorder that causes an often painful catching of the fingers or thumb. It occurs as a clicking, snapping, or locking of a finger in the palm of the hand. This is caused by a problem with the tendons that flex or bend the fingers sliding smoothly through their sheaths. The condition may occur in any finger or a couple fingers at the same time.  The finger may lock with the finger curled or suddenly straighten out with a snap. This is more common in patients with rheumatoid arthritis and diabetes. Left untreated, the condition may get worse to the point where the finger becomes locked in flexion, like making a fist, or less commonly locked with the finger straightened out. CAUSES   Inflammation and scarring that lead to swelling around the tendon sheath.  Repeated or forceful movements.  Rheumatoid arthritis, an autoimmune disease that affects joints.  Gout.  Diabetes mellitus. SIGNS AND SYMPTOMS  Soreness and swelling of your finger.  A painful clicking or snapping as you bend and straighten your finger. DIAGNOSIS  Your health care provider will do a physical exam of your finger to diagnose trigger finger. TREATMENT   Splinting for 6-8 weeks may be helpful.  Nonsteroidal anti-inflammatory medicines (NSAIDs) can help to relieve the pain and inflammation.  Cortisone injections, along with splinting, may speed up recovery. Several injections may be required. Cortisone may give relief after one injection.  Surgery is another treatment that may be used if conservative treatments do not work. Surgery can be minor, without incisions (a cut does not have to be made), and can be done with a needle through the skin.  Other surgical choices involve an open procedure in which the  surgeon opens the hand through a small incision and cuts the pulley so the tendon can again slide smoothly. Your hand will still work fine. HOME CARE INSTRUCTIONS  Apply ice to the injured area, twice per day:  Put ice in a plastic bag.  Place a towel between your skin and the bag.  Leave the ice on for 20 minutes, 3-4 times a day.  Rest your hand often. MAKE SURE YOU:   Understand these instructions.  Will watch your condition.  Will get help right away if you are not doing well or get worse.   This information is not intended to replace advice given to you by your health care provider. Make sure you discuss any questions you have with your health care provider.   Document Released: 03/21/2004 Document Revised: 02/01/2013 Document Reviewed: 11/01/2012 Elsevier Interactive Patient Education [employer]2016 [employer] Inc.

## 2015-10-15 NOTE — Progress Notes (Signed)
   Subjective:    [name], female    DOB: Jul 27, 1950, 65 y.o.   MRN: [id number]  HPI  Candice Warner is a 65 y/o right-handed female with a PMHx DM presenting with right ring finger catching/ pain.  Approximately 7 months ago she started gradually having catching with extension of her right 4th finger. She noted some swelling previously at the base of her MCP, however this has resolved. No h/o trauma/injry.  She's had trigger finger injections on 10/23/14 and again on 03/06/15. The injections helped for approximately 3 weeks but then the pain and catching came back. No erythema or warmth to the area. No fevers. She notes that since her last injection wore off she notes the pain has started to spread more proximally into the palm with flexion/extension as well.    Review of Systems : All negative besides that noted in HPI.      Past medical history reviewed Medications reviewed Allergies reviewed  Objective:   Physical Exam  Blood pressure 124/51, height 5' 6.5" (1.689 m), weight 192 lb (87.091 kg). Gen: well appearing, NAD. Pleasant.  MSK: Mild effusion at 3rd MCP note on the dorsum of the left hand. No swelling or erythema noted over the palmar aspects of the hands. On the right hand, no warmth noted. Tenderness at the base of the 4th MCP and the flexor tender. Able to flex fingers. Active triggering with flexion/extension of the right 4th finger. Decreased grip strength on the left compared to the right. Capillary refill of the phalanges brisk.   Bedside ultrasound of the 4th right digit notes a thickened A1 pulley with thickening and fluid of the flexor tendon at the level of the MCP.Findings consistent with trigger finger.     Assessment & Plan:   Trigger finger, right 4th finger: Patient presenting with symptoms and exam consistent with trigger finger x 7 months. She has undergone steroid injections twice with transient improvement in symptoms. Discussed ways to  prevent triggering. Referral to Dr. Richardson Landryan Murphy for operative management.   Joanna Puffrystal S. Dorsey, MD [facility] Resident  10/15/2015, 10:45 AM    Patient seen and evaluated with the resident. I agree with the above plan of care. Patient has failed conservative treatment and has had symptoms for a year. Therefore, I will refer the patient to Dr. Eulah PontMurphy (she has seen him before) to discuss merits of A1 pulley release. In the meantime, I did show her how to apply a Band-Aid splint to the PIP joint to help prevent triggering. Follow-up with me as needed.

## 2015-10-21 ENCOUNTER — Other Ambulatory Visit: Payer: Self-pay | Admitting: *Deleted

## 2015-10-21 DIAGNOSIS — E118 Type 2 diabetes mellitus with unspecified complications: Secondary | ICD-10-CM

## 2015-10-21 MED ORDER — METFORMIN HCL 500 MG PO TABS: 1000.0000 mg | ORAL_TABLET | Freq: Two times a day (BID) | ORAL | Status: DC

## 2015-10-29 DIAGNOSIS — M79644 Pain in right finger(s): Secondary | ICD-10-CM | POA: Diagnosis not present

## 2015-11-04 ENCOUNTER — Encounter (HOSPITAL_BASED_OUTPATIENT_CLINIC_OR_DEPARTMENT_OTHER): Payer: Self-pay | Admitting: *Deleted

## 2015-11-04 NOTE — H&P (Signed)
  Candice Warner comes in for follow up.  Persistent triggering, right ring finger.  This is now locking on her.  Two shots which helped for awhile.  She comes in to discuss definitive treatment.   History is reviewed.  Of note, the massive cuff repair I did on her right shoulder back in 2014 continues to do well with good motion, no pain and good function.    EXAMINATION: General exam is reviewed.  Lungs clear to auscultation bilaterally.  Heart sounds normal.  Specifically, obvious triggering A1 pulley, right ring finger.  This actually locks down.  Neurovascularly intact.  DISPOSITION:  Persistent triggering A1 pulley, right ring finger.  Failed conservative treatment.  We have talked about A1 pulley release.  Procedure, risks, benefits and complications reviewed.  Based on her job I am anticipating she is out of work for two weeks.  Once her wound is healed I will let her go back.  Paperwork complete.  All questions answered.  See her at the time of operative intervention.    Loreta Aveaniel F. Murphy, M.D.

## 2015-11-05 ENCOUNTER — Encounter (HOSPITAL_BASED_OUTPATIENT_CLINIC_OR_DEPARTMENT_OTHER)
Admission: RE | Admit: 2015-11-05 | Discharge: 2015-11-05 | Disposition: A | Discharge status: Home / Self Care | Payer: BLUE CROSS/BLUE SHIELD | Source: Ambulatory Visit | Attending: Orthopedic Surgery | Admitting: Orthopedic Surgery

## 2015-11-05 DIAGNOSIS — M67441 Ganglion, right hand: Secondary | ICD-10-CM | POA: Diagnosis not present

## 2015-11-05 DIAGNOSIS — F329 Major depressive disorder, single episode, unspecified: Secondary | ICD-10-CM | POA: Diagnosis not present

## 2015-11-05 DIAGNOSIS — M65341 Trigger finger, right ring finger: Secondary | ICD-10-CM | POA: Diagnosis not present

## 2015-11-05 DIAGNOSIS — M1990 Unspecified osteoarthritis, unspecified site: Secondary | ICD-10-CM | POA: Diagnosis not present

## 2015-11-05 DIAGNOSIS — E119 Type 2 diabetes mellitus without complications: Secondary | ICD-10-CM | POA: Diagnosis not present

## 2015-11-05 DIAGNOSIS — Z7984 Long term (current) use of oral hypoglycemic drugs: Secondary | ICD-10-CM | POA: Diagnosis not present

## 2015-11-05 DIAGNOSIS — E039 Hypothyroidism, unspecified: Secondary | ICD-10-CM | POA: Diagnosis not present

## 2015-11-05 DIAGNOSIS — I10 Essential (primary) hypertension: Secondary | ICD-10-CM | POA: Diagnosis not present

## 2015-11-05 LAB — BASIC METABOLIC PANEL
Anion gap: 9 (ref 5–15)
BUN: 13 mg/dL (ref 6–20)
CO2: 28 mmol/L (ref 22–32)
CREATININE: 0.97 mg/dL (ref 0.44–1.00)
Calcium: 9.3 mg/dL (ref 8.9–10.3)
Chloride: 103 mmol/L (ref 101–111)
GFR, EST NON AFRICAN AMERICAN: 60 mL/min — AB (ref >60)
Glucose, Bld: 141 mg/dL — ABNORMAL HIGH (ref 65–99)
Potassium: 3.3 mmol/L — ABNORMAL LOW (ref 3.5–5.1)
SODIUM: 140 mmol/L (ref 135–145)

## 2015-11-05 NOTE — Pre-Procedure Instructions (Signed)
Dr. Malen GauzeFoster informed of K+ 3.3 - pt. OK to come for surgery.

## 2015-11-07 ENCOUNTER — Encounter (HOSPITAL_BASED_OUTPATIENT_CLINIC_OR_DEPARTMENT_OTHER): Payer: Self-pay

## 2015-11-07 ENCOUNTER — Ambulatory Visit (HOSPITAL_BASED_OUTPATIENT_CLINIC_OR_DEPARTMENT_OTHER): Payer: BLUE CROSS/BLUE SHIELD | Admitting: Anesthesiology

## 2015-11-07 ENCOUNTER — Ambulatory Visit (HOSPITAL_BASED_OUTPATIENT_CLINIC_OR_DEPARTMENT_OTHER)
Admission: RE | Admit: 2015-11-07 | Discharge: 2015-11-07 | Disposition: A | Discharge status: Home / Self Care | Payer: BLUE CROSS/BLUE SHIELD | Source: Ambulatory Visit | Attending: Orthopedic Surgery | Admitting: Orthopedic Surgery

## 2015-11-07 ENCOUNTER — Encounter (HOSPITAL_BASED_OUTPATIENT_CLINIC_OR_DEPARTMENT_OTHER)
Admission: RE | Disposition: A | Discharge status: Home / Self Care | Payer: Self-pay | Source: Ambulatory Visit | Attending: Orthopedic Surgery

## 2015-11-07 DIAGNOSIS — M67441 Ganglion, right hand: Secondary | ICD-10-CM | POA: Diagnosis not present

## 2015-11-07 DIAGNOSIS — E039 Hypothyroidism, unspecified: Secondary | ICD-10-CM | POA: Diagnosis not present

## 2015-11-07 DIAGNOSIS — Z7984 Long term (current) use of oral hypoglycemic drugs: Secondary | ICD-10-CM | POA: Diagnosis not present

## 2015-11-07 DIAGNOSIS — I10 Essential (primary) hypertension: Secondary | ICD-10-CM | POA: Diagnosis not present

## 2015-11-07 DIAGNOSIS — E119 Type 2 diabetes mellitus without complications: Secondary | ICD-10-CM | POA: Insufficient documentation

## 2015-11-07 DIAGNOSIS — M65341 Trigger finger, right ring finger: Secondary | ICD-10-CM | POA: Diagnosis not present

## 2015-11-07 DIAGNOSIS — F329 Major depressive disorder, single episode, unspecified: Secondary | ICD-10-CM | POA: Insufficient documentation

## 2015-11-07 DIAGNOSIS — M1990 Unspecified osteoarthritis, unspecified site: Secondary | ICD-10-CM | POA: Diagnosis not present

## 2015-11-07 HISTORY — DX: Hypothyroidism, unspecified: E03.9

## 2015-11-07 HISTORY — PX: TRIGGER FINGER RELEASE: SHX641

## 2015-11-07 HISTORY — PX: CYST REMOVAL HAND: SHX6279

## 2015-11-07 LAB — GLUCOSE, CAPILLARY
GLUCOSE-CAPILLARY: 122 mg/dL — AB (ref 65–99)
Glucose-Capillary: 123 mg/dL — ABNORMAL HIGH (ref 65–99)

## 2015-11-07 SURGERY — RELEASE, A1 PULLEY, FOR TRIGGER FINGER
Anesthesia: General | Site: Hand | Laterality: Right

## 2015-11-07 MED ORDER — KETOROLAC TROMETHAMINE 15 MG/ML IJ SOLN
INTRAMUSCULAR | Status: DC | PRN
  Administered 2015-11-07: 15 mg via INTRAVENOUS

## 2015-11-07 MED ORDER — MEPERIDINE HCL 25 MG/ML IJ SOLN: 6.2500 mg | INTRAMUSCULAR | Status: DC | PRN

## 2015-11-07 MED ORDER — LACTATED RINGERS IV SOLN
INTRAVENOUS | Status: DC
  Administered 2015-11-07: 08:00:00 via INTRAVENOUS

## 2015-11-07 MED ORDER — CHLORHEXIDINE GLUCONATE 4 % EX LIQD: 60.0000 mL | Freq: Once | CUTANEOUS | Status: DC

## 2015-11-07 MED ORDER — BUPIVACAINE HCL (PF) 0.5 % IJ SOLN
INTRAMUSCULAR | Status: DC | PRN
  Administered 2015-11-07: 8 mL

## 2015-11-07 MED ORDER — OXYCODONE HCL 5 MG/5ML PO SOLN: 5.0000 mg | Freq: Once | ORAL | Status: DC | PRN

## 2015-11-07 MED ORDER — BUPIVACAINE HCL (PF) 0.5 % IJ SOLN
INTRAMUSCULAR | Status: AC
  Filled 2015-11-07: qty 60

## 2015-11-07 MED ORDER — FENTANYL CITRATE (PF) 100 MCG/2ML IJ SOLN
50.0000 ug | INTRAMUSCULAR | Status: DC | PRN
  Administered 2015-11-07 (×2): 50 ug via INTRAVENOUS

## 2015-11-07 MED ORDER — CEFAZOLIN SODIUM-DEXTROSE 2-4 GM/100ML-% IV SOLN
2.0000 g | INTRAVENOUS | Status: AC
  Administered 2015-11-07: 2 g via INTRAVENOUS

## 2015-11-07 MED ORDER — PROMETHAZINE HCL 25 MG/ML IJ SOLN
6.2500 mg | INTRAMUSCULAR | Status: DC | PRN
Start: 2015-11-07 — End: 2015-11-07

## 2015-11-07 MED ORDER — DEXAMETHASONE SODIUM PHOSPHATE 10 MG/ML IJ SOLN
INTRAMUSCULAR | Status: AC
  Filled 2015-11-07: qty 1

## 2015-11-07 MED ORDER — OXYCODONE HCL 5 MG PO TABS: 5.0000 mg | ORAL_TABLET | Freq: Once | ORAL | Status: DC | PRN

## 2015-11-07 MED ORDER — CEFAZOLIN SODIUM-DEXTROSE 2-4 GM/100ML-% IV SOLN
INTRAVENOUS | Status: AC
  Filled 2015-11-07: qty 100

## 2015-11-07 MED ORDER — LIDOCAINE HCL (CARDIAC) 20 MG/ML IV SOLN
INTRAVENOUS | Status: DC | PRN
  Administered 2015-11-07: 60 mg via INTRAVENOUS

## 2015-11-07 MED ORDER — HYDROMORPHONE HCL 1 MG/ML IJ SOLN: 0.2500 mg | INTRAMUSCULAR | Status: DC | PRN

## 2015-11-07 MED ORDER — LACTATED RINGERS IV SOLN: INTRAVENOUS | Status: DC

## 2015-11-07 MED ORDER — OXYCODONE-ACETAMINOPHEN 5-325 MG PO TABS: 1.0000 | ORAL_TABLET | ORAL | Status: DC | PRN

## 2015-11-07 MED ORDER — DEXAMETHASONE SODIUM PHOSPHATE 4 MG/ML IJ SOLN
INTRAMUSCULAR | Status: DC | PRN
  Administered 2015-11-07: 10 mg via INTRAVENOUS

## 2015-11-07 MED ORDER — ONDANSETRON HCL 4 MG PO TABS: 4.0000 mg | ORAL_TABLET | Freq: Three times a day (TID) | ORAL | Status: DC | PRN

## 2015-11-07 MED ORDER — ONDANSETRON HCL 4 MG/2ML IJ SOLN
INTRAMUSCULAR | Status: AC
  Filled 2015-11-07: qty 2

## 2015-11-07 MED ORDER — LACTATED RINGERS IV SOLN
INTRAVENOUS | Status: DC
Start: 2015-11-07 — End: 2015-11-07

## 2015-11-07 MED ORDER — SCOPOLAMINE 1 MG/3DAYS TD PT72: 1.0000 | MEDICATED_PATCH | Freq: Once | TRANSDERMAL | Status: DC | PRN

## 2015-11-07 MED ORDER — ONDANSETRON HCL 4 MG/2ML IJ SOLN
INTRAMUSCULAR | Status: DC | PRN
  Administered 2015-11-07: 4 mg via INTRAVENOUS

## 2015-11-07 MED ORDER — MIDAZOLAM HCL 2 MG/2ML IJ SOLN
1.0000 mg | INTRAMUSCULAR | Status: DC | PRN
  Administered 2015-11-07: 2 mg via INTRAVENOUS

## 2015-11-07 MED ORDER — MIDAZOLAM HCL 2 MG/2ML IJ SOLN
INTRAMUSCULAR | Status: AC
  Filled 2015-11-07: qty 2

## 2015-11-07 MED ORDER — LIDOCAINE 2% (20 MG/ML) 5 ML SYRINGE
INTRAMUSCULAR | Status: AC
  Filled 2015-11-07: qty 5

## 2015-11-07 MED ORDER — GLYCOPYRROLATE 0.2 MG/ML IJ SOLN: 0.2000 mg | Freq: Once | INTRAMUSCULAR | Status: DC | PRN

## 2015-11-07 MED ORDER — FENTANYL CITRATE (PF) 100 MCG/2ML IJ SOLN
INTRAMUSCULAR | Status: AC
  Filled 2015-11-07: qty 2

## 2015-11-07 MED ORDER — KETOROLAC TROMETHAMINE 30 MG/ML IJ SOLN
INTRAMUSCULAR | Status: AC
  Filled 2015-11-07: qty 1

## 2015-11-07 SURGICAL SUPPLY — 44 items
BANDAGE ACE 3X5.8 VEL STRL LF (GAUZE/BANDAGES/DRESSINGS) ×2 IMPLANT
BLADE SURG 15 STRL LF DISP TIS (BLADE) ×1 IMPLANT
BLADE SURG 15 STRL SS (BLADE) ×2
BNDG CMPR 9X4 STRL LF SNTH (GAUZE/BANDAGES/DRESSINGS) ×1
BNDG COHESIVE 3X5 TAN STRL LF (GAUZE/BANDAGES/DRESSINGS) ×2 IMPLANT
BNDG ESMARK 4X9 LF (GAUZE/BANDAGES/DRESSINGS) ×1 IMPLANT
CORDS BIPOLAR (ELECTRODE) IMPLANT
COVER BACK TABLE 60X90IN (DRAPES) ×2 IMPLANT
COVER MAYO STAND STRL (DRAPES) ×2 IMPLANT
CUFF TOURNIQUET SINGLE 18IN (TOURNIQUET CUFF) ×1 IMPLANT
DRAPE EXTREMITY T 121X128X90 (DRAPE) ×2 IMPLANT
DRAPE SURG 17X23 STRL (DRAPES) ×2 IMPLANT
DRSG PAD ABDOMINAL 8X10 ST (GAUZE/BANDAGES/DRESSINGS) ×2 IMPLANT
DURAPREP 26ML APPLICATOR (WOUND CARE) ×2 IMPLANT
GAUZE SPONGE 4X4 12PLY STRL (GAUZE/BANDAGES/DRESSINGS) ×2 IMPLANT
GAUZE XEROFORM 1X8 LF (GAUZE/BANDAGES/DRESSINGS) ×2 IMPLANT
GLOVE BIOGEL PI IND STRL 7.0 (GLOVE) ×1 IMPLANT
GLOVE BIOGEL PI INDICATOR 7.0 (GLOVE) ×3
GLOVE ECLIPSE 6.5 STRL STRAW (GLOVE) ×1 IMPLANT
GLOVE ECLIPSE 7.0 STRL STRAW (GLOVE) ×1 IMPLANT
GLOVE SURG ORTHO 8.0 STRL STRW (GLOVE) ×2 IMPLANT
GOWN STRL REUS W/ TWL LRG LVL3 (GOWN DISPOSABLE) ×2 IMPLANT
GOWN STRL REUS W/ TWL XL LVL3 (GOWN DISPOSABLE) ×1 IMPLANT
GOWN STRL REUS W/TWL LRG LVL3 (GOWN DISPOSABLE) ×2
GOWN STRL REUS W/TWL XL LVL3 (GOWN DISPOSABLE) ×4
NDL HYPO 25X1 1.5 SAFETY (NEEDLE) ×1 IMPLANT
NEEDLE HYPO 25X1 1.5 SAFETY (NEEDLE) ×2 IMPLANT
NS IRRIG 1000ML POUR BTL (IV SOLUTION) ×2 IMPLANT
PACK BASIN DAY SURGERY FS (CUSTOM PROCEDURE TRAY) ×2 IMPLANT
PAD CAST 3X4 CTTN HI CHSV (CAST SUPPLIES) ×2 IMPLANT
PADDING CAST ABS 3INX4YD NS (CAST SUPPLIES)
PADDING CAST ABS 4INX4YD NS (CAST SUPPLIES)
PADDING CAST ABS COTTON 3X4 (CAST SUPPLIES) ×1 IMPLANT
PADDING CAST ABS COTTON 4X4 ST (CAST SUPPLIES) ×1 IMPLANT
PADDING CAST COTTON 3X4 STRL (CAST SUPPLIES) ×2
SPLINT FIBERGLASS 3X35 (CAST SUPPLIES) ×1 IMPLANT
SPLINT PLASTER CAST XFAST 3X15 (CAST SUPPLIES) IMPLANT
SPLINT PLASTER XTRA FASTSET 3X (CAST SUPPLIES)
STOCKINETTE 4X48 STRL (DRAPES) ×2 IMPLANT
SUT ETHILON 3 0 PS 1 (SUTURE) ×2 IMPLANT
SYR BULB 3OZ (MISCELLANEOUS) ×2 IMPLANT
SYR CONTROL 10ML LL (SYRINGE) ×2 IMPLANT
TOWEL OR 17X24 6PK STRL BLUE (TOWEL DISPOSABLE) ×2 IMPLANT
UNDERPAD 30X30 (UNDERPADS AND DIAPERS) ×1 IMPLANT

## 2015-11-07 NOTE — Anesthesia Preprocedure Evaluation (Addendum)
Anesthesia Evaluation  Patient identified by MRN, date of birth, ID band Patient awake    Reviewed: Allergy & Precautions, NPO status , Patient's Chart, lab work & pertinent test results  Airway Mallampati: I  TM Distance: >3 FB Neck ROM: Full    Dental  (+) Upper Dentures, Partial Lower, Dental Advisory Given   Pulmonary    breath sounds clear to auscultation       Cardiovascular hypertension, Pt. on medications  Rhythm:Regular Rate:Normal     Neuro/Psych PSYCHIATRIC DISORDERS Depression  Neuromuscular disease    GI/Hepatic negative GI ROS, Neg liver ROS,   Endo/Other  diabetes, Type 2, Oral Hypoglycemic AgentsHypothyroidism   Renal/GU negative Renal ROS  negative genitourinary   Musculoskeletal  (+) Arthritis ,   Abdominal   Peds negative pediatric ROS (+)  Hematology negative hematology ROS (+)   Anesthesia Other Findings   Reproductive/Obstetrics negative OB ROS                            Lab Results  Component Value Date   WBC 6.7 04/26/2015   HGB 12.4 04/26/2015   HCT 37.8 04/26/2015   MCV 86.9 04/26/2015   PLT 298 04/26/2015   Lab Results  Component Value Date   INR 0.9 ratio 05/13/2009   10/2015 EKG: sinus bradycardia.    Anesthesia Physical Anesthesia Plan  ASA: II  Anesthesia Plan: General   Post-op Pain Management:    Induction: Intravenous  Airway Management Planned: LMA  Additional Equipment:   Intra-op Plan:   Post-operative Plan: Extubation in OR  Informed Consent: I have reviewed the patients History and Physical, chart, labs and discussed the procedure including the risks, benefits and alternatives for the proposed anesthesia with the patient or authorized representative who has indicated his/her understanding and acceptance.   Dental advisory given  Plan Discussed with: CRNA  Anesthesia Plan Comments:        Anesthesia Quick  Evaluation

## 2015-11-07 NOTE — Transfer of Care (Signed)
Immediate Anesthesia Transfer of Care Note  Patient: Candice Warner  Procedure(s) Performed: Procedure(s): RELEASE TRIGGER FINGER/A-1 PULLEY RIGHT RING FINGER  (Right) EXCISION OF VOLAR RETANICULUM RIGHT HAND (Right)  Patient Location: PACU  Anesthesia Type:General  Level of Consciousness: awake and sedated  Airway & Oxygen Therapy: Patient Spontanous Breathing and Patient connected to face mask oxygen  Post-op Assessment: Report given to RN and Post -op Vital signs reviewed and stable  Post vital signs: Reviewed and stable  Last Vitals:  Filed Vitals:   11/07/15 0725  BP: 135/59  Pulse: 46  Temp: 36.5 C  Resp: 18    Last Pain: There were no vitals filed for this visit.       Complications: No apparent anesthesia complications

## 2015-11-07 NOTE — Anesthesia Postprocedure Evaluation (Signed)
Anesthesia Post Note  Patient: Candice Warner  Procedure(s) Performed: Procedure(s) (LRB): RELEASE TRIGGER FINGER/A-1 PULLEY RIGHT RING FINGER  (Right) EXCISION OF VOLAR RETANICULUM RIGHT HAND (Right)  Patient location during evaluation: PACU Anesthesia Type: General Level of consciousness: awake and alert Pain management: pain level controlled Vital Signs Assessment: post-procedure vital signs reviewed and stable Respiratory status: spontaneous breathing, nonlabored ventilation, respiratory function stable and patient connected to nasal cannula oxygen Cardiovascular status: blood pressure returned to baseline and stable Postop Assessment: no signs of nausea or vomiting Anesthetic complications: no    Last Vitals:  Filed Vitals:   11/07/15 1045 11/07/15 1100  BP: 119/58 121/58  Pulse: 47 46  Temp:    Resp: 13 15    Last Pain:  Filed Vitals:   11/07/15 1104  PainSc: 0-No pain                 Shelton SilvasKevin D Marlowe Lawes

## 2015-11-07 NOTE — Anesthesia Procedure Notes (Signed)
Procedure Name: LMA Insertion Performed by: Nollie Shiflett W Pre-anesthesia Checklist: Patient identified, Emergency Drugs available, Suction available and Patient being monitored Patient Re-evaluated:Patient Re-evaluated prior to inductionOxygen Delivery Method: Circle System Utilized Preoxygenation: Pre-oxygenation with 100% oxygen Intubation Type: IV induction Ventilation: Mask ventilation without difficulty LMA: LMA inserted LMA Size: 4.0 Number of attempts: 1 Placement Confirmation: positive ETCO2 Tube secured with: Tape Dental Injury: Teeth and Oropharynx as per pre-operative assessment      

## 2015-11-07 NOTE — Discharge Instructions (Signed)
Do not remove bandage.  Do not get bandage wet.  May apply ice for up to 20 min at a time for pain and swelling.  Follow up appointment in one week. ° °SEEK MEDICAL CARE IF: °You have swelling of your calf or leg.  °You develop shortness of breath or chest pain.  °You have redness, swelling, or increasing pain in the wound.  °There is pus or any unusual drainage coming from the surgical site.  °You notice a bad smell coming from the surgical site or dressing.  °The surgical site breaks open after sutures or staples have been removed.  °There is persistent bleeding from the suture or staple line.  °You are getting worse or are not improving.  °You have any other questions or concerns.  °SEEK IMMEDIATE MEDICAL CARE IF:  °You have a fever.  °You develop a rash.  °You have difficulty breathing.  °You develop any reaction or side effects to medicines given.  °Your knee motion is decreasing rather than improving.  °MAKE SURE YOU:  °Understand these instructions.  °Will watch your condition.  °Will get help right away if you are not doing well or get worse.  ° ° °Post Anesthesia Home Care Instructions ° °Activity: °Get plenty of rest for the remainder of the day. A responsible adult should stay with you for 24 hours following the procedure.  °For the next 24 hours, DO NOT: °-Drive a car °-Operate machinery °-Drink alcoholic beverages °-Take any medication unless instructed by your physician °-Make any legal decisions or sign important papers. ° °Meals: °Start with liquid foods such as gelatin or soup. Progress to regular foods as tolerated. Avoid greasy, spicy, heavy foods. If nausea and/or vomiting occur, drink only clear liquids until the nausea and/or vomiting subsides. Call your physician if vomiting continues. ° °Special Instructions/Symptoms: °Your throat may feel dry or sore from the anesthesia or the breathing tube placed in your throat during surgery. If this causes discomfort, gargle with warm salt water. The  discomfort should disappear within 24 hours. ° °If you had a scopolamine patch placed behind your ear for the management of post- operative nausea and/or vomiting: ° °1. The medication in the patch is effective for 72 hours, after which it should be removed.  Wrap patch in a tissue and discard in the trash. Wash hands thoroughly with soap and water. °2. You may remove the patch earlier than 72 hours if you experience unpleasant side effects which may include dry mouth, dizziness or visual disturbances. °3. Avoid touching the patch. Wash your hands with soap and water after contact with the patch. °  ° ° °

## 2015-11-07 NOTE — Interval H&P Note (Signed)
History and Physical Interval Note:  11/07/2015 7:23 AM  Candice Warner  has presented today for surgery, with the diagnosis of RIGHT RING FINGER TRIGGER FINGER RELEASE TENDON SHEATH INCISION  The various methods of treatment have been discussed with the patient and family. After consideration of risks, benefits and other options for treatment, the patient has consented to  Procedure(s): RELEASE TRIGGER FINGER/A-1 PULLEY RIGHT RING FINGER  (Right) as a surgical intervention .  The patient's history has been reviewed, patient examined, no change in status, stable for surgery.  I have reviewed the patient's chart and labs.  Questions were answered to the patient's satisfaction.     Loreta Aveaniel F Bradd Merlos

## 2015-11-07 NOTE — Op Note (Signed)
NAMVonzell Warner:  Daponte, Sarahgrace               ACCOUNT NO.:  [id number]  MEDICAL RECORD NO.:  [id number]  LOCATION:                                 FACILITY:  PHYSICIAN:  Loreta Aveaniel F. Darriel Sinquefield, M.D. DATE OF BIRTH:  [birth year]  DATE OF PROCEDURE:  11/07/2015 DATE OF DISCHARGE:                              OPERATIVE REPORT   PREOPERATIVE DIAGNOSIS:  Symptomatic triggering A1 pulley, right ring finger.  POSTOPERATIVE DIAGNOSIS:  Symptomatic triggering A1 pulley, right ring finger, but also with an associated 2-3 mm volar retinacular ganglion, slightly distal and radial to the midline.  PROCEDURE:  Excision of volar retinacular ganglion from A1 pulley, right ring finger.  Complete release of A1 pulley for triggering.  SURGEON:  Loreta Aveaniel F. Eneida Evers, M.D.  ASSISTANT:  Mikey KirschnerLindsey Stanberry, PA  ANESTHESIA:  General.  BLOOD LOSS:  Minimal.  SPECIMENS:  None.  CULTURES:  None.  COMPLICATIONS:  None.  DRESSINGS:  Soft compressive.  TOURNIQUET TIME:  25 minutes.  DESCRIPTION OF PROCEDURE:  The patient was brought to the operating room and after adequate anesthesia had been obtained, tourniquet applied, prepped and draped in usual sterile fashion.  Exsanguinated with elevation of Esmarch, tourniquet was inflated to 250 mmHg.  Small transverse incision over the A1 pulley, ring finger.  Skin and subcutaneous tissue divided.  Neurovascular bundles identified, protected.  2-3-mm cyst excised in its entirety from the distal aspect of A1 pulley.  I then did a complete release of A1 pulley under direct visualization.  Moderate attrition on the tendon, fair amount of fluid, all debrided, all triggering obliterated.  Wound irrigated and closed with nylon.  Margins were injected with Marcaine.  Sterile compressive dressing applied. Tourniquet was inflated and removed.  Anesthesia reversed.  Brought to the recovery room.  Tolerated the surgery well, no complications.     Loreta Aveaniel F. Jamicah Anstead,  M.D.   ______________________________ Loreta Aveaniel F. Chesnee Floren, M.D.    DFM/MEDQ  D:  11/07/2015  T:  11/07/2015  Job:  [id number]

## 2015-11-08 ENCOUNTER — Encounter (HOSPITAL_BASED_OUTPATIENT_CLINIC_OR_DEPARTMENT_OTHER): Payer: Self-pay | Admitting: Orthopedic Surgery

## 2015-11-15 DIAGNOSIS — M65341 Trigger finger, right ring finger: Secondary | ICD-10-CM | POA: Diagnosis not present

## 2015-11-22 DIAGNOSIS — M65341 Trigger finger, right ring finger: Secondary | ICD-10-CM | POA: Diagnosis not present

## 2015-12-02 ENCOUNTER — Other Ambulatory Visit: Payer: Self-pay | Admitting: *Deleted

## 2015-12-02 MED ORDER — SERTRALINE HCL 100 MG PO TABS: 100.0000 mg | ORAL_TABLET | Freq: Every day | ORAL | Status: DC

## 2015-12-03 MED ORDER — SERTRALINE HCL 100 MG PO TABS: 100.0000 mg | ORAL_TABLET | Freq: Every day | ORAL | Status: DC

## 2015-12-03 NOTE — Addendum Note (Signed)
Addended by: Myra RudeSCHMITZ, JEREMY E on: 12/03/2015 12:27 PM   Modules accepted: Orders

## 2015-12-03 NOTE — Telephone Encounter (Signed)
Received fax from [facility] stating patient's insurance is requiring a 90 day supply for coverage.  Clovis PuMartin, Tamika L, RN

## 2015-12-03 NOTE — Addendum Note (Signed)
Addended by: Clovis PuMARTIN, Tinea Nobile L on: 12/03/2015 12:12 PM   Modules accepted: Orders

## 2016-01-20 ENCOUNTER — Other Ambulatory Visit: Payer: Self-pay | Admitting: *Deleted

## 2016-01-20 DIAGNOSIS — E118 Type 2 diabetes mellitus with unspecified complications: Secondary | ICD-10-CM

## 2016-01-20 MED ORDER — METFORMIN HCL 500 MG PO TABS: 1000.0000 mg | ORAL_TABLET | Freq: Two times a day (BID) | ORAL | 0 refills | Status: DC

## 2016-02-11 ENCOUNTER — Other Ambulatory Visit: Payer: Self-pay | Admitting: *Deleted

## 2016-02-11 DIAGNOSIS — E118 Type 2 diabetes mellitus with unspecified complications: Secondary | ICD-10-CM

## 2016-02-11 MED ORDER — METFORMIN HCL 500 MG PO TABS: 1000.0000 mg | ORAL_TABLET | Freq: Two times a day (BID) | ORAL | 0 refills | Status: DC

## 2016-02-12 ENCOUNTER — Other Ambulatory Visit: Payer: Self-pay | Admitting: *Deleted

## 2016-02-13 MED ORDER — LEVOTHYROXINE SODIUM 50 MCG PO TABS: 50.0000 ug | ORAL_TABLET | Freq: Every day | ORAL | 1 refills | Status: DC

## 2016-04-06 ENCOUNTER — Ambulatory Visit (INDEPENDENT_AMBULATORY_CARE_PROVIDER_SITE_OTHER): Payer: BLUE CROSS/BLUE SHIELD | Admitting: Internal Medicine

## 2016-04-06 VITALS — BP 158/54 | HR 50 | Temp 98.4°F | Ht 67.0 in | Wt 193.8 lb

## 2016-04-06 DIAGNOSIS — M79672 Pain in left foot: Secondary | ICD-10-CM

## 2016-04-06 DIAGNOSIS — E119 Type 2 diabetes mellitus without complications: Secondary | ICD-10-CM

## 2016-04-06 DIAGNOSIS — E039 Hypothyroidism, unspecified: Secondary | ICD-10-CM

## 2016-04-06 DIAGNOSIS — I10 Essential (primary) hypertension: Secondary | ICD-10-CM | POA: Diagnosis not present

## 2016-04-06 LAB — POCT GLYCOSYLATED HEMOGLOBIN (HGB A1C): Hemoglobin A1C: 6.6

## 2016-04-06 NOTE — Progress Notes (Signed)
   [facility] Candice CharsAsiyah Nivaan Dicenzo, MD Phone: [phone]  Reason For Visit: Foot Pain,   Left foot Pain - Lateral midfoot. - About two weeks ago patient with lateral foot pain.  - No radiation of pain, localized to on spot  - No burning/ tingling sensation - Take tylenol at times for pain with some relief - Worse throughout the day when patient is standing on her feet - No trauma, no bruising, no redness - No hx of foot issues  - No recent running/walking regiment  - No new shoes  - No hx of gout   # CHRONIC HTN Current Meds - No issues with medications   Reports good compliance, took meds today. Tolerating well, w/o complaints. Lifestyle - none  Denies CP, dyspnea, HA, edema, dizziness / lightheadedness  #DIABETES Issues: None Disease Monitoring: None,well controlled Polyuria/phagia/dipsia- none      Visual problems- opthalmology about two months ago, [facility]  Medications:Metformin  Compliance- No issues  #Hypothyroidism  - Well controlled  - No issues with synthroid  - Denies  fatigue, cold intolerance, weight gain, constipation, dry skin, myalgia  Past Medical History Reviewed problem list.  Medications- reviewed and updated No additions to family history Social history- patient is a non- smoker  Objective: BP (!) 158/54 (BP Location: Left Arm, Patient Position: Sitting, Cuff Size: Normal)   Pulse (!) 50   Temp 98.4 F (36.9 C) (Oral)   Ht 5\' 7"  (1.702 m)   Wt 193 lb 12.8 oz (87.9 kg)   SpO2 100%   BMI 30.35 kg/m  Gen: NAD, alert, cooperative with exam HEENT: Normal    Neck: No masses palpated. No lymphadenopathy, no goiter  Cardio: regular rate and rhythm, S1S2 heard, no murmurs appreciated Pulm: clear to auscultation bilaterally, no wheezes, rhonchi or rales Feet: Possibly slight swelling in left foot compared to right,  No erythema, warmth or bruising. Normal rang of motion in right foot, pain with eversion of left foot, not with  inversion. Pain with palpation along the lateral foot at the metatarsals/tarsal joint line. 5/5 strength. 2+ DP pulses bilaterally    Assessment/Plan: See problem based a/p  Hypothyroidism Well controlled on Synthroid  - Will check labs a next visit   Diabetes mellitus without complication (HCC) A1C 6.6, currently on metformin - well controlled.  - Not currently on statin or aspirin - follow up with lipid panel to determine ASCVD risk  - Eye exam recently performed - patient to contact opthalmology to send report  - Refused flu and pneumonia vaccine  - Will need an uptodated foot exam this year   HYPERTENSION, BENIGN SYSTEMIC Blood pressure previously well controlled. Patient currently with foot pain therefore will recheck next visit  - Continue current regiment of Prinzide   Left foot pain Lateral midfoot pain - current differential includes tendiopathy vs occult fracture. Will get foot xray to rule out fracture. However if that is negative likely patient with fibularis brevis/longus tendinopathy - as pain elicited with eversion vs possible osteoarthitis of the TMT.   - Follow up in 1 week  - Continue tylenol for pain, and ibuprofen as needed  - Ice foot BID

## 2016-04-06 NOTE — Patient Instructions (Addendum)
I will do blood work today and I will also order a foot x-ray to determine what the possible cause of your pain could be. I would recommend continuing Tylenol and ibuprofen twice daily as needed for your pain. Continue icing your foot as you have been doing. follow-up in one week for a physical.

## 2016-04-07 ENCOUNTER — Ambulatory Visit (HOSPITAL_COMMUNITY)
Admission: RE | Admit: 2016-04-07 | Discharge: 2016-04-07 | Disposition: A | Discharge status: Home / Self Care | Payer: BLUE CROSS/BLUE SHIELD | Source: Ambulatory Visit | Attending: Family Medicine | Admitting: Family Medicine

## 2016-04-07 DIAGNOSIS — M19072 Primary osteoarthritis, left ankle and foot: Secondary | ICD-10-CM | POA: Diagnosis not present

## 2016-04-07 DIAGNOSIS — M79672 Pain in left foot: Secondary | ICD-10-CM

## 2016-04-08 NOTE — Assessment & Plan Note (Signed)
A1C 6.6, currently on metformin - well controlled.  - Not currently on statin or aspirin - follow up with lipid panel to determine ASCVD risk  - Eye exam recently performed - patient to contact opthalmology to send report  - Refused flu and pneumonia vaccine  - Will need an uptodated foot exam this year

## 2016-04-08 NOTE — Assessment & Plan Note (Signed)
Lateral midfoot pain - current differential includes tendiopathy vs occult fracture. Will get foot xray to rule out fracture. However if that is negative likely patient with fibularis brevis/longus tendinopathy - as pain elicited with eversion vs possible osteoarthitis of the TMT.   - Follow up in 1 week  - Continue tylenol for pain, and ibuprofen as needed  - Ice foot BID

## 2016-04-08 NOTE — Assessment & Plan Note (Signed)
Blood pressure previously well controlled. Patient currently with foot pain therefore will recheck next visit  - Continue current regiment of Prinzide

## 2016-04-08 NOTE — Assessment & Plan Note (Signed)
Well controlled on Synthroid  - Will check labs a next visit

## 2016-04-13 ENCOUNTER — Ambulatory Visit (INDEPENDENT_AMBULATORY_CARE_PROVIDER_SITE_OTHER): Payer: BLUE CROSS/BLUE SHIELD | Admitting: Internal Medicine

## 2016-04-13 VITALS — BP 110/57 | HR 61 | Temp 98.4°F | Ht 67.0 in | Wt 192.4 lb

## 2016-04-13 DIAGNOSIS — E039 Hypothyroidism, unspecified: Secondary | ICD-10-CM

## 2016-04-13 DIAGNOSIS — Z23 Encounter for immunization: Secondary | ICD-10-CM | POA: Diagnosis not present

## 2016-04-13 DIAGNOSIS — M79672 Pain in left foot: Secondary | ICD-10-CM | POA: Diagnosis not present

## 2016-04-13 DIAGNOSIS — I10 Essential (primary) hypertension: Secondary | ICD-10-CM

## 2016-04-13 MED ORDER — MELOXICAM 7.5 MG PO TABS: 7.5000 mg | ORAL_TABLET | Freq: Every day | ORAL | 0 refills | Status: DC

## 2016-04-13 NOTE — Progress Notes (Signed)
Candice Warner is a 65 y.o. female presents to office today for annual physical exam examination.  Concerns today include:  Foot pain, not improving   Last eye exam: recently seen, will send referral  Last dental exam: March 2017  Last colonoscopy: Has had a colonoscopy in the past, but not sure when, possibly greater than 10 years  Last mammogram: 07/18/2015 Immunizations needed: Pneumonia and Flu vaccine  Refills needed today: None   Women's Health  Postmenopause - No vaginal bleeding  Sexual activity: Sometimes, single partner (long term)  STD Screening: No concerns  Exercise: Daily walk dog normal  Diet:  24-hr recall:   Breakfast- Bananas and grits   Snack-  None  Lunch-  Skip lunch  Supper- Mal[place]urkey Sandwich/Chicken Dumplings   Typical day yes Smoking: None  Alcohol: None  Drugs: None  Advance directives: Living will, does not want to be on any life support  Mood: No   Past Medical History:  Diagnosis Date  . Depression   . Diabetes mellitus   . Hyperlipidemia   . Hypertension   . Hypokalemia   . Hypothyroidism   . Lumbar back pain   . Obesity   . Radiculopathy   . Thyroid disease    Social History   Social History  . Marital status: Widowed    Spouse name: N/A  . Number of children: N/A  . Years of education: N/A   Occupational History  . Not on file.   Social History Main Topics  . Smoking status: Never Smoker  . Smokeless tobacco: Not on file  . Alcohol use No  . Drug use: No  . Sexual activity: Not on file   Other Topics Concern  . 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  Divorced, 4 children, 1 adopted.  Husband died 11/03 from colon CA.; Son murdered (hanging) 07/19/99, father died 07/1999.  Lives w/ 14yo son (adopted).    Past Surgical History:  Procedure Laterality Date  . CARDIAC CATHETERIZATION  2010   No CAD  . CYST REMOVAL HAND Right 11/07/2015   Procedure: EXCISION OF VOLAR  RETANICULUM RIGHT HAND;  Surgeon: Loreta Aveaniel F Murphy, MD;  Location: [facility];  Service: Orthopedics;  Laterality: Right;  . SHOULDER ARTHROSCOPY  2002   left  . SHOULDER ARTHROSCOPY WITH ROTATOR CUFF REPAIR AND SUBACROMIAL DECOMPRESSION  07/14/2012   Procedure: SHOULDER ARTHROSCOPY WITH ROTATOR CUFF REPAIR AND SUBACROMIAL DECOMPRESSION;  Surgeon: Loreta Aveaniel F Murphy, MD;  Location: [facility];  Service: Orthopedics;  Laterality: Right;  Right Shoulder Arthroscopy, Distal Claviculectomy, Subacromial Decompression, Partial Acromioplasty with Coracoacromial Release with Arthroscopic Rotator Cuff Repair   . THYROID LOBECTOMY     Goiter s/p right thyroidectomy  . TRIGGER FINGER RELEASE Right 11/07/2015   Procedure: RELEASE TRIGGER FINGER/A-1 PULLEY RIGHT RING FINGER ;  Surgeon: Loreta Aveaniel F Murphy, MD;  Location: [facility];  Service: Orthopedics;  Laterality: Right;  . TUBAL LIGATION     Family History  Problem Relation Age of Onset  . Heart disease Mother   . Heart disease Father     ROS: Review of Systems Review of Systems  Constitutional: Negative for weight loss.  Eyes: Negative.   Respiratory: Negative for shortness of breath.   Cardiovascular: Negative for chest pain.  Gastrointestinal: Negative for nausea and vomiting.  Genitourinary: Negative for dysuria and urgency.  Musculoskeletal: Negative for myalgias.  Skin: Negative.   Neurological: Negative for dizziness, tingling, sensory change, weakness and headaches.  Endo/Heme/Allergies: Does not bruise/bleed easily.  Psychiatric/Behavioral: Negative for depression and suicidal ideas.    Physical exam Physical Exam  Constitutional: She is oriented to person, place, and time. She appears well-developed and well-nourished.  HENT:  Head: Normocephalic and atraumatic.  Right Ear: External ear normal.  Left Ear: External ear normal.  Mouth/Throat: Oropharynx is clear and moist.  Eyes:  Conjunctivae are normal. Pupils are equal, round, and reactive to light.  Neck: Normal range of motion. Neck supple.  Cardiovascular: Normal rate, regular rhythm, normal heart sounds and intact distal pulses.   Pulmonary/Chest: Effort normal and breath sounds normal.  Abdominal: Soft. Bowel sounds are normal.  Musculoskeletal: Normal range of motion.  Neurological: She is alert and oriented to person, place, and time. She has normal reflexes.  Skin: Skin is warm and dry.  Psychiatric: She has a normal mood and affect.  Breast exam: wnl, no breast nodules noted on exam, no axillary lymph nodes.    Assessment/ Plan: Patient is here for for annual physical exam.   Left foot pain Lateral localized tenderness of the left foot. Not improved over the past week. Xray not showing any bony deformities.  - Mobic 7.5 mg daily for 7 days - Ambulatory referral to Sports Medicine      Alfard Cochrane Cathlean CowerMikell PGY-2, [facility]

## 2016-04-13 NOTE — Patient Instructions (Addendum)
Please follow up regarding getting a colonoscopy done. I want you to get a bone scan to determine if you need treatment for your bones. I will send you a letter with the results of your labs.

## 2016-04-14 LAB — LIPID PANEL
Cholesterol: 179 mg/dL (ref 125–200)
HDL: 54 mg/dL (ref >=46)
LDL CALC: 73 mg/dL (ref <130)
TRIGLYCERIDES: 262 mg/dL — AB (ref <150)
Total CHOL/HDL Ratio: 3.3 Ratio (ref <=5.0)
VLDL: 52 mg/dL — ABNORMAL HIGH (ref <30)

## 2016-04-14 LAB — BASIC METABOLIC PANEL WITH GFR
BUN: 16 mg/dL (ref 7–25)
CHLORIDE: 102 mmol/L (ref 98–110)
CO2: 28 mmol/L (ref 20–31)
CREATININE: 0.89 mg/dL (ref 0.50–0.99)
Calcium: 9.5 mg/dL (ref 8.6–10.4)
GFR, Est African American: 78 mL/min (ref >=60)
GFR, Est Non African American: 68 mL/min (ref >=60)
Glucose, Bld: 84 mg/dL (ref 65–99)
Potassium: 3.3 mmol/L — ABNORMAL LOW (ref 3.5–5.3)
Sodium: 141 mmol/L (ref 135–146)

## 2016-04-14 LAB — TSH: TSH: 2.69 m[IU]/L

## 2016-04-14 NOTE — Assessment & Plan Note (Signed)
Lateral localized tenderness of the left foot. Not improved over the past week. Xray not showing any bony deformities.  - Mobic 7.5 mg daily for 7 days - Ambulatory referral to Sports Medicine

## 2016-04-20 ENCOUNTER — Ambulatory Visit (INDEPENDENT_AMBULATORY_CARE_PROVIDER_SITE_OTHER): Payer: BLUE CROSS/BLUE SHIELD | Admitting: Sports Medicine

## 2016-04-20 ENCOUNTER — Encounter: Payer: Self-pay | Admitting: Sports Medicine

## 2016-04-20 VITALS — BP 132/49 | HR 61 | Ht 67.0 in | Wt 192.0 lb

## 2016-04-20 DIAGNOSIS — M25572 Pain in left ankle and joints of left foot: Secondary | ICD-10-CM | POA: Diagnosis not present

## 2016-04-20 DIAGNOSIS — M79672 Pain in left foot: Secondary | ICD-10-CM | POA: Diagnosis not present

## 2016-04-20 MED ORDER — NAPROXEN 500 MG PO TABS: 500.0000 mg | ORAL_TABLET | Freq: Two times a day (BID) | ORAL | 0 refills | Status: DC | PRN

## 2016-04-20 NOTE — Progress Notes (Signed)
  Candice Warner  Date of birth: 04-27-1951  CC: Left foot pain  SUBJECTIVE:  [name] is a pleasant 65 yo female who presents with one month of lateral left foot pain that radiates dorsally. No trauma or inciting event. Never had foot pain before. Pain is worse with weight bearing. 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 Reports mild swelling of the area. No weakness or numbness. Taking mobic for the past week without relief.   ROS: Per HPI  DATA REVIEWED: XR Left foot 3 view - "Mild degenerative changes.  No acute osseous abnormality."  PHYSICAL EXAM:  VS: BP:(!) 132/49  HR:61bpm  HT:5\' 7"  (170.2 cm)   WT:192 lb (87.1 kg)  BMI:30.1   Pleasant, well developed female in no acute distress  L foot: No swelling, erythema, effusion, or bony deformities. Significant tenderness at the base of the 5th metatarsal. Pain with eversion. 5/5 dorsiflexion and plantarflexion. Pes planus with midfoot pronation. Neurovascularly intact distally.  ASSESSMENT & PLAN:  Left foot pain: fibularis brevis tendonitis vs stress reaction of proximal 5th metatarsal  - Short walking boot while weight bearing - Discontinue mobic - Naproxen 500mg  BID x 1 week - Apply ice to the affected area 2-3 times daily for 10 minutes - OOW  until follow up in 2 weeks - Plan at f/u: if pain improved will proceed with orthotics vs additional imaging (MRI vs ultrasound) if worsening/no improvement  Patient seen and evaluated with the medical student. I agree with the above plan of care. Patient likely has a rather significant peroneal tendinitis of her foot. We will immobilize her in a short walking boot for the next 2 weeks. I also think she needs to be out of work until follow-up with me in 2 weeks. If her symptoms improve, we will consider custom orthotics for her pes planus. If symptoms persist or worsen then I would consider further diagnostic imaging. Her x-rays do show some mild degenerative  changes but this does not explain her pain.

## 2016-04-21 ENCOUNTER — Ambulatory Visit
Admission: RE | Admit: 2016-04-21 | Discharge: 2016-04-21 | Disposition: A | Discharge status: Home / Self Care | Payer: BLUE CROSS/BLUE SHIELD | Source: Ambulatory Visit | Attending: Family Medicine | Admitting: Family Medicine

## 2016-04-21 DIAGNOSIS — E039 Hypothyroidism, unspecified: Secondary | ICD-10-CM

## 2016-04-21 DIAGNOSIS — Z13820 Encounter for screening for osteoporosis: Secondary | ICD-10-CM | POA: Diagnosis not present

## 2016-04-21 DIAGNOSIS — Z780 Asymptomatic menopausal state: Secondary | ICD-10-CM | POA: Diagnosis not present

## 2016-04-30 ENCOUNTER — Telehealth: Payer: Self-pay | Admitting: Internal Medicine

## 2016-04-30 DIAGNOSIS — E119 Type 2 diabetes mellitus without complications: Secondary | ICD-10-CM

## 2016-04-30 MED ORDER — ATORVASTATIN CALCIUM 40 MG PO TABS: 40.0000 mg | ORAL_TABLET | Freq: Every day | ORAL | 3 refills | Status: DC

## 2016-04-30 NOTE — Telephone Encounter (Signed)
Called patient to let her know the results of her labs. Her ASCVD risk 30%. Currently on baby aspirin, patient started on Atorvastatin 40 mg daily. Patient in agreement, no further questions.

## 2016-05-05 ENCOUNTER — Ambulatory Visit (INDEPENDENT_AMBULATORY_CARE_PROVIDER_SITE_OTHER): Payer: BLUE CROSS/BLUE SHIELD | Admitting: Sports Medicine

## 2016-05-05 ENCOUNTER — Encounter: Payer: Self-pay | Admitting: Sports Medicine

## 2016-05-05 VITALS — BP 107/47 | HR 63 | Ht 67.0 in | Wt 192.0 lb

## 2016-05-05 DIAGNOSIS — M7672 Peroneal tendinitis, left leg: Secondary | ICD-10-CM | POA: Diagnosis not present

## 2016-05-05 DIAGNOSIS — M79672 Pain in left foot: Secondary | ICD-10-CM

## 2016-05-05 MED ORDER — NAPROXEN 500 MG PO TABS: 500.0000 mg | ORAL_TABLET | Freq: Two times a day (BID) | ORAL | 0 refills | Status: DC | PRN

## 2016-05-05 NOTE — Progress Notes (Signed)
   Subjective:    Patient ID: Candice AdieLinda J Warner, female    DOB: 23-Feb-1951, 65 y.o.   MRN: [id number]  HPI   Patient comes in today for follow-up on left foot pain which is felt to be secondary to either peroneal tendon tendinitis or a mild stress reaction at the proximal fifth metatarsal. Overall, her pain has improved. She is wearing her Cam Walker on a limited basis. Although we called in some naproxen sodium for her, she has not heard from her pharmacy about this. Interestingly, she started taking some of her daughter's naproxen and it has made a lot of difference in her foot pain. She has been out of work since her last office visit with me. She feels like she is ready to return. She denies any swelling in her foot. She states that she is back to ambulating normally.    Review of Systems     Objective:   Physical Exam  Well-developed, well-nourished. No acute distress. Awake alert and oriented 3. Vital signs reviewed  Left foot: Mild tenderness to palpation at the base of the fifth metatarsal but it is significantly improved from her office visit on November 6. There continues to be no obvious swelling or erythema. Patient still demonstrates pes planus with standing and midfoot pronation with walking. She is neurovascularly intact distally and walking without a limp.       Assessment & Plan:   Improving left foot pain likely secondary to peroneal tendon tendinitis Pes planus  Patient did not get her original naproxen sodium prescription because we electronically sent it to a pharmacy that she is no longer using. We got her new pharmacy information today and made sure to call in her naproxen to this pharmacy. She will take 500 mg twice daily as needed. I also think she would benefit from some arch support. We will start with some green sports insoles with scaphoid pads and the patient will return to the office in 4 weeks. If she finds the inserts to be comfortable, then I would consider  making her custom orthotics at that follow-up visit. In the meantime, I will release her to return to work without restrictions on Monday, November 27. She will call me with questions or concerns prior to her follow-up visit.

## 2016-05-25 ENCOUNTER — Other Ambulatory Visit: Payer: Self-pay | Admitting: *Deleted

## 2016-05-25 MED ORDER — SERTRALINE HCL 100 MG PO TABS: 100.0000 mg | ORAL_TABLET | Freq: Every day | ORAL | 1 refills | Status: DC

## 2016-06-01 ENCOUNTER — Ambulatory Visit: Payer: BLUE CROSS/BLUE SHIELD | Admitting: Sports Medicine

## 2016-06-12 ENCOUNTER — Other Ambulatory Visit: Payer: Self-pay | Admitting: *Deleted

## 2016-06-16 ENCOUNTER — Other Ambulatory Visit: Payer: Self-pay | Admitting: *Deleted

## 2016-06-16 MED ORDER — NAPROXEN 500 MG PO TABS: 500.0000 mg | ORAL_TABLET | Freq: Two times a day (BID) | ORAL | 0 refills | Status: DC | PRN

## 2016-06-17 MED ORDER — LISINOPRIL-HYDROCHLOROTHIAZIDE 20-25 MG PO TABS: 1.0000 | ORAL_TABLET | Freq: Every day | ORAL | 1 refills | Status: DC

## 2016-06-22 ENCOUNTER — Ambulatory Visit: Payer: BLUE CROSS/BLUE SHIELD | Admitting: Sports Medicine

## 2016-06-29 ENCOUNTER — Ambulatory Visit: Payer: BLUE CROSS/BLUE SHIELD | Admitting: Sports Medicine

## 2016-06-30 ENCOUNTER — Encounter: Payer: Self-pay | Admitting: Internal Medicine

## 2016-06-30 ENCOUNTER — Ambulatory Visit (INDEPENDENT_AMBULATORY_CARE_PROVIDER_SITE_OTHER): Payer: BLUE CROSS/BLUE SHIELD | Admitting: Internal Medicine

## 2016-06-30 VITALS — BP 112/78 | HR 54 | Temp 98.5°F | Ht 67.0 in | Wt 191.2 lb

## 2016-06-30 DIAGNOSIS — E119 Type 2 diabetes mellitus without complications: Secondary | ICD-10-CM | POA: Diagnosis not present

## 2016-06-30 DIAGNOSIS — E039 Hypothyroidism, unspecified: Secondary | ICD-10-CM

## 2016-06-30 DIAGNOSIS — R61 Generalized hyperhidrosis: Secondary | ICD-10-CM | POA: Diagnosis not present

## 2016-06-30 LAB — COMPLETE METABOLIC PANEL WITH GFR
ALT: 8 U/L (ref 6–29)
AST: 13 U/L (ref 10–35)
Albumin: 3.8 g/dL (ref 3.6–5.1)
Alkaline Phosphatase: 54 U/L (ref 33–130)
BUN: 17 mg/dL (ref 7–25)
CALCIUM: 9.1 mg/dL (ref 8.6–10.4)
CHLORIDE: 105 mmol/L (ref 98–110)
CO2: 28 mmol/L (ref 20–31)
CREATININE: 1.04 mg/dL — AB (ref 0.50–0.99)
GFR, EST AFRICAN AMERICAN: 65 mL/min (ref >=60)
GFR, EST NON AFRICAN AMERICAN: 56 mL/min — AB (ref >=60)
Glucose, Bld: 114 mg/dL — ABNORMAL HIGH (ref 65–99)
POTASSIUM: 3.8 mmol/L (ref 3.5–5.3)
Sodium: 141 mmol/L (ref 135–146)
Total Bilirubin: 0.4 mg/dL (ref 0.2–1.2)
Total Protein: 7.2 g/dL (ref 6.1–8.1)

## 2016-06-30 LAB — POCT GLYCOSYLATED HEMOGLOBIN (HGB A1C): HEMOGLOBIN A1C: 6.5

## 2016-06-30 LAB — TSH: TSH: 3.9 m[IU]/L

## 2016-06-30 MED ORDER — ATORVASTATIN CALCIUM 40 MG PO TABS: 40.0000 mg | ORAL_TABLET | Freq: Every day | ORAL | 3 refills | Status: DC

## 2016-06-30 NOTE — Assessment & Plan Note (Signed)
Night sweats/palpations/SOB x 2 in the last two weeks in the middle of night - concern for low blood sugar. No weight loss.  - Discussed eating a high protein snack before sleep  - Will check TSH, CMET for electrolytes and A1C  - Follow up in 6 weeks or sooner if no improvement  - Check blood sugar if event occurs again  - Discussed with Dr. Jennette KettleNeal - no cardiac investigation for now

## 2016-06-30 NOTE — Patient Instructions (Signed)
Please check blood sugar next time you have one of these episodes. I want you to follow up with me in 1 month to see how things are going. I will let you if your blood work shows anything to be concerned about.

## 2016-06-30 NOTE — Progress Notes (Signed)
   [facility] Noralee CharsAsiyah Jamisha Hoeschen, MD Phone: [phone]  Reason For Visit: SDA - low blood sugars   # Woke up in the middle of night with night sweat. She states her heart was beating fast, she felt SOB - occurred twice within two weeks. Event resolves after patient drinking orange juice in about 20 minutes. Patient had not checked blood sugar during event. Has never had this before. Has been on zolft since 1992, no issues with that medication. States no abnormalities other than drinking a lot of water lately. No weight loss per patient. No fevers. No loss of appetite. Endorse palpations during this time. No chest pain. No issues with her synthroid. Denies having any worsening anxiety or depression, no stressor at the moment. Patient does not usually eat dinner, or will eat chips for dinner.   CHRONIC DM, Type 2: Reports concerns as above  CBGs: No blood sugars below 70. Checks CBGs every once in a while  Meds: Metformin 1000 mg BID  Reports  good compliance. Tolerating well w/o side-effects Currently on ACEi / ARB  Any hypoglycemia episodes: Denies palpations, diaphoresis, fatigue, weakness, jittery Denies polyuria, visual changes, numbness or tingling.   No issues with synthriod, taking it regularl    Past Medical History Reviewed problem list.  Medications- reviewed and updated No additions to family history Social history- patient is a non- smoker  Objective: BP 112/78 (BP Location: Left Arm, Patient Position: Sitting, Cuff Size: Normal)   Pulse (!) 54   Temp 98.5 F (36.9 C) (Oral)   Ht 5\' 7"  (1.702 m)   Wt 191 lb 3.2 oz (86.7 kg)   SpO2 99%   BMI 29.95 kg/m  Gen: NAD, alert, cooperative with exam HEENT: Normal    Neck: No masses palpated. No lymphadenopathy, normal thyroid  Cardio: regular rate and rhythm, S1S2 heard, no murmurs appreciated Pulm: clear to auscultation bilaterally, no wheezes, rhonchi or rales GI: soft, non-tender, non-distended,  bowel sounds present, no hepatomegaly, no splenomegaly Skin: dry, intact, no rashes or lesions   Assessment/Plan: See problem based a/p  Night sweats Night sweats/palpations/SOB x 2 in the last two weeks in the middle of night - concern for low blood sugar. No weight loss.  - Discussed eating a high protein snack before sleep  - Will check TSH, CMET for electrolytes and A1C  - Follow up in 6 weeks or sooner if no improvement  - Check blood sugar if event occurs again  - Discussed with Dr. Jennette KettleNeal - no cardiac investigation for now   Diabetes mellitus without complication (HCC) Will check A1C today  Continue metformin 1000 mg BID  Follow up in 1 month

## 2016-06-30 NOTE — Assessment & Plan Note (Signed)
Will check A1C today  Continue metformin 1000 mg BID  Follow up in 1 month

## 2016-07-10 DIAGNOSIS — K5730 Diverticulosis of large intestine without perforation or abscess without bleeding: Secondary | ICD-10-CM | POA: Diagnosis not present

## 2016-07-10 DIAGNOSIS — Z1211 Encounter for screening for malignant neoplasm of colon: Secondary | ICD-10-CM | POA: Diagnosis not present

## 2016-08-05 ENCOUNTER — Telehealth: Payer: Self-pay | Admitting: Internal Medicine

## 2016-09-03 ENCOUNTER — Other Ambulatory Visit: Payer: Self-pay | Admitting: Internal Medicine

## 2016-09-03 DIAGNOSIS — Z1231 Encounter for screening mammogram for malignant neoplasm of breast: Secondary | ICD-10-CM

## 2016-09-07 ENCOUNTER — Other Ambulatory Visit: Payer: Self-pay | Admitting: *Deleted

## 2016-09-08 MED ORDER — LEVOTHYROXINE SODIUM 50 MCG PO TABS: 50.0000 ug | ORAL_TABLET | Freq: Every day | ORAL | 1 refills | Status: DC

## 2016-09-21 ENCOUNTER — Ambulatory Visit
Admission: RE | Admit: 2016-09-21 | Discharge: 2016-09-21 | Disposition: A | Discharge status: Home / Self Care | Payer: BLUE CROSS/BLUE SHIELD | Source: Ambulatory Visit | Attending: Family Medicine | Admitting: Family Medicine

## 2016-09-21 DIAGNOSIS — Z1231 Encounter for screening mammogram for malignant neoplasm of breast: Secondary | ICD-10-CM | POA: Diagnosis not present

## 2016-10-27 ENCOUNTER — Ambulatory Visit (INDEPENDENT_AMBULATORY_CARE_PROVIDER_SITE_OTHER): Payer: BLUE CROSS/BLUE SHIELD | Admitting: Internal Medicine

## 2016-10-27 ENCOUNTER — Encounter: Payer: Self-pay | Admitting: Internal Medicine

## 2016-10-27 VITALS — BP 115/80 | HR 63 | Temp 98.3°F | Ht 67.0 in | Wt 189.0 lb

## 2016-10-27 DIAGNOSIS — M5442 Lumbago with sciatica, left side: Secondary | ICD-10-CM

## 2016-10-27 DIAGNOSIS — E119 Type 2 diabetes mellitus without complications: Secondary | ICD-10-CM

## 2016-10-27 LAB — POCT GLYCOSYLATED HEMOGLOBIN (HGB A1C): HEMOGLOBIN A1C: 7.4

## 2016-10-27 MED ORDER — GABAPENTIN 100 MG PO CAPS: 100.0000 mg | ORAL_CAPSULE | Freq: Three times a day (TID) | ORAL | 3 refills | Status: DC

## 2016-10-27 NOTE — Patient Instructions (Addendum)
Please try gabapentin to help with the pain. I want to check an xray of your back. I will also set you up with physical therapy

## 2016-10-28 ENCOUNTER — Ambulatory Visit (HOSPITAL_COMMUNITY)
Admission: RE | Admit: 2016-10-28 | Discharge: 2016-10-28 | Disposition: A | Discharge status: Home / Self Care | Payer: BLUE CROSS/BLUE SHIELD | Source: Ambulatory Visit | Attending: Family Medicine | Admitting: Family Medicine

## 2016-10-28 DIAGNOSIS — M5136 Other intervertebral disc degeneration, lumbar region: Secondary | ICD-10-CM | POA: Insufficient documentation

## 2016-10-28 DIAGNOSIS — M1288 Other specific arthropathies, not elsewhere classified, other specified site: Secondary | ICD-10-CM | POA: Insufficient documentation

## 2016-10-28 DIAGNOSIS — M545 Low back pain: Secondary | ICD-10-CM | POA: Diagnosis not present

## 2016-10-28 DIAGNOSIS — M5442 Lumbago with sciatica, left side: Secondary | ICD-10-CM | POA: Diagnosis not present

## 2016-10-28 DIAGNOSIS — M47816 Spondylosis without myelopathy or radiculopathy, lumbar region: Secondary | ICD-10-CM | POA: Insufficient documentation

## 2016-10-28 LAB — CBC
HEMATOCRIT: 34.2 % (ref 34.0–46.6)
HEMOGLOBIN: 11.2 g/dL (ref 11.1–15.9)
MCH: 28.4 pg (ref 26.6–33.0)
MCHC: 32.7 g/dL (ref 31.5–35.7)
MCV: 87 fL (ref 79–97)
Platelets: 327 10*3/uL (ref 150–379)
RBC: 3.95 x10E6/uL (ref 3.77–5.28)
RDW: 13.7 % (ref 12.3–15.4)
WBC: 5.8 10*3/uL (ref 3.4–10.8)

## 2016-11-02 ENCOUNTER — Encounter: Payer: Self-pay | Admitting: Internal Medicine

## 2016-11-02 NOTE — Assessment & Plan Note (Signed)
A1C up significantly from 6.5 to 7.4  - POCT glycosylated hemoglobin (Hb A1C) - Continue metformin for now  - Consider adding on a second oral agent if patient's A1C continues to rise  - Two month follow up

## 2016-11-02 NOTE — Progress Notes (Signed)
   [facility] Noralee CharsAsiyah Joss Friedel, MD Phone: [phone]  Reason For Visit:  Right Leg Pain/Back pain   # BACK PAIN  Back pain began several weeks, patient has previously had a hx of this back pain. Patient indicates pain radiating down her left leg. Pain is described as radiates down her leg in foot, denies any associated numbness or tingling. Indicates the pain improves with walking, worsens with sitting or lying down.  Patient has tried tylenol for the pain  History of trauma or injury: none  Prior history of similar pain: indicates having pain like this previously in the past, which self resolved  History of cancer: None  Weak immune system: None  History of IV drug use: None  History of steroid use: None   Symptoms Incontinence of bowel or bladder:  None  Numbness of leg: None  Fever: None  Rest or Night pain: None Weight Loss:  None  CHRONIC DM, Type 2: Reports no concerns Takes metformin for her diabetes Reports good compliance. Tolerating well w/o side-effects Any hypoglycemia episodes: Denies palpations, diaphoresis, fatigue, weakness, jittery  Past Medical History Reviewed problem list.  Medications- reviewed and updated No additions to family history Social history- patient is a non- smoker  Objective: BP 115/80 (BP Location: Left Arm, Patient Position: Sitting, Cuff Size: Normal)   Pulse 63   Temp 98.3 F (36.8 C) (Oral)   Ht 5\' 7"  (1.702 m)   Wt 189 lb (85.7 kg)   SpO2 98%   BMI 29.60 kg/m  Gen: NAD, alert, cooperative with exam Cardio: regular rate and rhythm, S1S2 heard, no murmurs appreciated Pulm: clear to auscultation bilaterally, no wheezes, rhonchi or rales Extremities: No pain with palpation of spinal processes, pain in sacroiliac joints, normal range of motion of the lower extremity, 5 out of 5 strength in left and right lower extremity, 2+ patellar reflexes bilaterally, 2 DP+ pulses present MSK: Normal gait and  station Skin: dry, intact, no rashes or lesions  Assessment/Plan: See problem based a/p  Back pain Radicular pain, no red flags, 5/5 strength and normal reflexes in lower extremities  - DG Lumbar Spine 2-3 Views; Future - gabapentin (NEURONTIN) 100 MG capsule; Take 1 capsule (100 mg total) by mouth 3 (three) times daily.  Dispense: 90 capsule; Refill: 3 - Ambulatory referral to Physical Therapy  Diabetes mellitus without complication (HCC) A1C up significantly from 6.5 to 7.4  - POCT glycosylated hemoglobin (Hb A1C) - Continue metformin for now  - Consider adding on a second oral agent if patient's A1C continues to rise  - Two month follow up

## 2016-11-02 NOTE — Assessment & Plan Note (Addendum)
Radicular pain, no red flags, 5/5 strength and normal reflexes in lower extremities  - DG Lumbar Spine 2-3 Views; Future - gabapentin (NEURONTIN) 100 MG capsule; Take 1 capsule (100 mg total) by mouth 3 (three) times daily.  Dispense: 90 capsule; Refill: 3 - Ambulatory referral to Physical Therapy

## 2016-11-03 ENCOUNTER — Ambulatory Visit: Payer: BLUE CROSS/BLUE SHIELD

## 2016-11-12 ENCOUNTER — Telehealth: Payer: Self-pay | Admitting: Internal Medicine

## 2016-11-12 NOTE — Telephone Encounter (Signed)
Discussed patient's xray's results. Back pain has improved significantly.

## 2016-11-20 ENCOUNTER — Ambulatory Visit: Payer: BLUE CROSS/BLUE SHIELD | Admitting: Internal Medicine

## 2016-11-24 DIAGNOSIS — H25011 Cortical age-related cataract, right eye: Secondary | ICD-10-CM | POA: Diagnosis not present

## 2016-11-24 DIAGNOSIS — H25042 Posterior subcapsular polar age-related cataract, left eye: Secondary | ICD-10-CM | POA: Diagnosis not present

## 2016-11-24 DIAGNOSIS — E119 Type 2 diabetes mellitus without complications: Secondary | ICD-10-CM | POA: Diagnosis not present

## 2016-12-30 ENCOUNTER — Other Ambulatory Visit: Payer: Self-pay | Admitting: *Deleted

## 2016-12-30 MED ORDER — METFORMIN HCL 500 MG PO TABS: 1000.0000 mg | ORAL_TABLET | Freq: Two times a day (BID) | ORAL | 0 refills | Status: DC

## 2017-02-02 ENCOUNTER — Other Ambulatory Visit: Payer: Self-pay | Admitting: *Deleted

## 2017-02-02 MED ORDER — SERTRALINE HCL 100 MG PO TABS: 100.0000 mg | ORAL_TABLET | Freq: Every day | ORAL | 0 refills | Status: DC

## 2017-02-02 NOTE — Telephone Encounter (Signed)
Patient needs to make an appointment to be seen for her depression in the next month, only a 30 day supple filled

## 2017-02-02 NOTE — Telephone Encounter (Signed)
Left message on voicemail for patient to call back and schedule an appointment.

## 2017-03-02 ENCOUNTER — Other Ambulatory Visit: Payer: Self-pay | Admitting: *Deleted

## 2017-03-02 DIAGNOSIS — M5442 Lumbago with sciatica, left side: Secondary | ICD-10-CM

## 2017-03-02 MED ORDER — GABAPENTIN 100 MG PO CAPS: 100.0000 mg | ORAL_CAPSULE | Freq: Three times a day (TID) | ORAL | 3 refills | Status: DC

## 2017-03-15 ENCOUNTER — Other Ambulatory Visit: Payer: Self-pay | Admitting: *Deleted

## 2017-03-15 MED ORDER — LEVOTHYROXINE SODIUM 50 MCG PO TABS: 50.0000 ug | ORAL_TABLET | Freq: Every day | ORAL | 1 refills | Status: DC

## 2017-04-21 ENCOUNTER — Other Ambulatory Visit: Payer: Self-pay

## 2017-04-21 ENCOUNTER — Ambulatory Visit (INDEPENDENT_AMBULATORY_CARE_PROVIDER_SITE_OTHER): Payer: BLUE CROSS/BLUE SHIELD | Admitting: Internal Medicine

## 2017-04-21 ENCOUNTER — Encounter: Payer: Self-pay | Admitting: Internal Medicine

## 2017-04-21 VITALS — BP 120/62 | HR 63 | Temp 98.7°F | Ht 67.0 in | Wt 189.0 lb

## 2017-04-21 DIAGNOSIS — K5900 Constipation, unspecified: Secondary | ICD-10-CM

## 2017-04-21 DIAGNOSIS — F339 Major depressive disorder, recurrent, unspecified: Secondary | ICD-10-CM | POA: Diagnosis not present

## 2017-04-21 DIAGNOSIS — Z23 Encounter for immunization: Secondary | ICD-10-CM | POA: Diagnosis not present

## 2017-04-21 DIAGNOSIS — E119 Type 2 diabetes mellitus without complications: Secondary | ICD-10-CM

## 2017-04-21 DIAGNOSIS — Z0000 Encounter for general adult medical examination without abnormal findings: Secondary | ICD-10-CM

## 2017-04-21 LAB — POCT GLYCOSYLATED HEMOGLOBIN (HGB A1C): Hemoglobin A1C: 6.9

## 2017-04-21 NOTE — Progress Notes (Signed)
69 

## 2017-04-21 NOTE — Progress Notes (Signed)
[facility] Noralee CharsAsiyah Alcie Runions, MD Phone: [phone]  Reason For Visit: Follow up   # CHRONIC DM, Type 2: Does not check her blood sugars daily Currently on metformin no problems with this medication Currently on ACEi / ARB  Lifestyle: Did not discuss lifestyle changes at this visit. had her eye exam this year at [facility] Foot exam today performed, mild changes in sensation  # Depression  Symptoms: That her depression is well controlled on Zoloft.  She indicates that her symptoms initially started several years ago after her son was killed and then about 2 weeks later her father died.  In the following year her husband died of cancer which culminated her and her having a major depressive episode.  She states that Zoloft helps her a lot with her mood. Duration of CURRENT symptoms: She denies any current symptoms of depression or sadness.  She denies any SI. Impact on function: PHQ-9 2  #Constipation -Patient used to have consistent bowel movements every day. however over the past couple of years she has had problems with constipation.  She states that she often goes several days without having a bowel movement.  She has not used any medications to help with this.  She has once used an enema which she said worked very well.  #Health maintenance -Patient did have a [facility]oscopy last year [facility].  She is not sure of the results of the colonoscopy.  She states she is willing to call them to get those transferred over to us. -eye exam and foot exam discussed above   Past Medical History Reviewed problem list.  Medications- reviewed and updated No additions to family history Social history- patient is a non-smoker  Objective: BP 120/62   Pulse 63   Temp 98.7 F (37.1 C) (Oral)   Ht 5\' 7"  (1.702 m)   Wt 189 lb (85.7 kg)   SpO2 98%   BMI 29.60 kg/m  Gen: NAD, alert, cooperative with exam Cardio: regular rate and rhythm, S1S2 heard, no murmurs  appreciated Pulm: clear to auscultation bilaterally, no wheezes, rhonchi or rales GI: soft, non-tender, non-distended, bowel sounds present, no hepatomegaly, no splenomegaly Extremities: warm, well perfused, No edema, cyanosis or clubbing;  MSK: Normal gait and station Skin: dry, intact, no rashes or lesions  Diabetic Foot Exam - Simple   Simple Foot Form Diabetic Foot exam was performed with the following findings:  Yes 04/21/2017  4:06 PM  Visual Inspection No deformities, no ulcerations, no other skin breakdown bilaterally:  Yes Sensation Testing See comments:  Yes Pulse Check Posterior Tibialis and Dorsalis pulse intact bilaterally:  Yes Comments Some areas of decreased sensation, mild specifically on left foot;       Assessment/Plan: See problem based a/p  Diabetes mellitus without complication (HCC) A1c 6.9 improved from previous -Will continue metformin -Diabetic foot exam with minor changes in sensation discussed continuing to moisturize feet and keep nails short.  Make sure to wear shoes always -Patient has had an eye exam this year at [facility]-patient to try to obtain results from them and send to us -Follow-up in 3 months  Major depressive disorder, recurrent episode (HCC) Well-controlled on Zoloft PHQ 9 of 2 No suicidal ideation Patient feels like this medication does very well for her No changes  Constipation Discussed high-fiber foods with patient Provided patient with recommendations for MiraLAX and Colace as well Patient has had a colonoscopy which was done in 2017 though I  do not have the results of this she is going to try to get them for me Follow-up as needed  Healthcare maintenance Foot exam obtained Colonoscopy performed in 2017, patient to provide us with results Ophthalmology exam earlier this year awaiting results

## 2017-04-21 NOTE — Patient Instructions (Addendum)
For your constipation you can use MiraLAX and Colace these are both of the over-the-counter medications I would try my best for you to increase the fiber in your diet.  You can always eat when these high-fiber cereals.  Your diabetes is well controlled your A1c is 6.9 today. Your blood pressure is well controlled. please follow-up with me in January  High-Fiber Diet Fiber, also called dietary fiber, is a type of carbohydrate found in fruits, vegetables, whole grains, and beans. A high-fiber diet can have many health benefits. Your health care provider may recommend a high-fiber diet to help:  Prevent constipation. Fiber can make your bowel movements more regular.  Lower your cholesterol.  Relieve hemorrhoids, uncomplicated diverticulosis, or irritable bowel syndrome.  Prevent overeating as part of a weight-loss plan.  Prevent heart disease, type 2 diabetes, and certain cancers.  What is my plan? The recommended daily intake of fiber includes:  38 grams for men under age 67.  30 grams for men over age 67.  25 grams for women under age 67.  21 grams for women over age 67.  You can get the recommended daily intake of dietary fiber by eating a variety of fruits, vegetables, grains, and beans. Your health care provider may also recommend a fiber supplement if it is not possible to get enough fiber through your diet. What do I need to know about a high-fiber diet?  Fiber supplements have not been widely studied for their effectiveness, so it is better to get fiber through food sources.  Always check the fiber content on thenutrition facts label of any prepackaged food. Look for foods that contain at least 5 grams of fiber per serving.  Ask your dietitian if you have questions about specific foods that are related to your condition, especially if those foods are not listed in the following section.  Increase your daily fiber consumption gradually. Increasing your intake of dietary fiber  too quickly may cause bloating, cramping, or gas.  Drink plenty of water. Water helps you to digest fiber. What foods can I eat? Grains Whole-grain breads. Multigrain cereal. Oats and oatmeal. Brown rice. Barley. Bulgur wheat. Millet. Bran muffins. Popcorn. Rye wafer crackers. Vegetables Sweet potatoes. Spinach. Kale. Artichokes. Cabbage. Broccoli. Green peas. Carrots. Squash. Fruits Berries. Pears. Apples. Oranges. Avocados. Prunes and raisins. Dried figs. Meats and Other Protein Sources Navy, kidney, pinto, and soy beans. Split peas. Lentils. Nuts and seeds. Dairy Fiber-fortified yogurt. Beverages Fiber-fortified soy milk. Fiber-fortified orange juice. Other Fiber bars. The items listed above may not be a complete list of recommended foods or beverages. Contact your dietitian for more options. What foods are not recommended? Grains White bread. Pasta made with refined flour. White rice. Vegetables Fried potatoes. Canned vegetables. Well-cooked vegetables. Fruits Fruit juice. Cooked, strained fruit. Meats and Other Protein Sources Fatty cuts of meat. Fried Environmental education officerpoultry or fried fish. Dairy Milk. Yogurt. Cream cheese. Sour cream. Beverages Soft drinks. Other Cakes and pastries. Butter and oils. The items listed above may not be a complete list of foods and beverages to avoid. Contact your dietitian for more information. What are some tips for including high-fiber foods in my diet?  Eat a wide variety of high-fiber foods.  Make sure that half of all grains consumed each day are whole grains.  Replace breads and cereals made from refined flour or white flour with whole-grain breads and cereals.  Replace white rice with brown rice, bulgur wheat, or millet.  Start the day with a breakfast that  is high in fiber, such as a cereal that contains at least 5 grams of fiber per serving.  Use beans in place of meat in soups, salads, or pasta.  Eat high-fiber snacks, such as berries,  raw vegetables, nuts, or popcorn. This information is not intended to replace advice given to you by your health care provider. Make sure you discuss any questions you have with your health care provider. Document Released: 06/01/2005 Document Revised: 11/07/2015 Document Reviewed: 11/14/2013 Elsevier Interactive Patient Education  2017 [employer]

## 2017-04-22 DIAGNOSIS — K5900 Constipation, unspecified: Secondary | ICD-10-CM | POA: Insufficient documentation

## 2017-04-22 NOTE — Assessment & Plan Note (Signed)
Well-controlled on Zoloft PHQ 9 of 2 No suicidal ideation Patient feels like this medication does very well for her No changes

## 2017-04-22 NOTE — Assessment & Plan Note (Signed)
Foot exam obtained Colonoscopy performed in 2017, patient to provide us with results Ophthalmology exam earlier this year awaiting results

## 2017-04-22 NOTE — Assessment & Plan Note (Signed)
A1c 6.9 improved from previous -Will continue metformin -Diabetic foot exam with minor changes in sensation discussed continuing to moisturize feet and keep nails short.  Make sure to wear shoes always -Patient has had an eye exam this year at [facility]-patient to try to obtain results from them and send to us -Follow-up in 3 months

## 2017-04-22 NOTE — Assessment & Plan Note (Signed)
Discussed high-fiber foods with patient Provided patient with recommendations for MiraLAX and Colace as well Patient has had a colonoscopy which was done in 2017 though I do not have the results of this she is going to try to get them for me Follow-up as needed

## 2017-05-12 ENCOUNTER — Other Ambulatory Visit: Payer: Self-pay | Admitting: Internal Medicine

## 2017-05-12 MED ORDER — METFORMIN HCL 500 MG PO TABS: 1000.0000 mg | ORAL_TABLET | Freq: Two times a day (BID) | ORAL | 3 refills | Status: DC

## 2017-06-30 ENCOUNTER — Ambulatory Visit (INDEPENDENT_AMBULATORY_CARE_PROVIDER_SITE_OTHER): Payer: BLUE CROSS/BLUE SHIELD | Admitting: Internal Medicine

## 2017-06-30 VITALS — BP 119/60 | HR 73 | Temp 98.0°F | Ht 67.0 in | Wt 188.6 lb

## 2017-06-30 DIAGNOSIS — H109 Unspecified conjunctivitis: Secondary | ICD-10-CM

## 2017-06-30 NOTE — Progress Notes (Signed)
   [facility] Candice CharsAsiyah Marquice Uddin, MD Phone: [phone]  Reason For Visit: Conjunctivitis   # Red eye  Onset -started Monday.  Progression patient states it has been getting worse. Patient states when she wakes up in the morning she cant open her eye due to the discharge.  She does have discharge throughout the day from her eye patient with left sided headache. Patient was congested about two weeks, this resolved.   Red Flags  No reduction of visual acuity  No Photophobia  No Severe foreign body sensation that prevents the patient from keeping the eye open No fixed pupil  Headache since Monday, no nausea with the headache. Headache improves with Aleve. Patient states her gums are sore   Visual acuity exam in negative  Past Medical History Reviewed problem list.  Medications- reviewed and updated No additions to family history Social history- patient is a non- smoker  Objective: BP 119/60 (BP Location: Left Arm, Patient Position: Sitting, Cuff Size: Normal)   Pulse 73   Temp 98 F (36.7 C) (Oral)   Ht 5\' 7"  (1.702 m)   Wt 188 lb 9.6 oz (85.5 kg)   SpO2 97%   BMI 29.54 kg/m  Gen: NAD, alert, cooperative with exam HEENT: Normal, no tenderness of the maxillary or ethmoid sinuses    Eyes: PERRLA, EOMI without pain    Nose: nasal turbinates moist    Throat: moist mucus membranes, no erythema Skin: dry, intact, no rashes or lesions  Assessment/Plan: See problem based a/p  Bacterial conjunctivitis of left eye Acute conjunctivitis of the left eye, does note some headache however able to improve with Aleve and has been ongoing for several days, from unlikely concern for glaucoma. no changes in eyesight.  Visual acuity 20/40 bilateral eyes.  Normal examination.  Given patient with headache and conjunctivitis with discharge noted in eye during visit will treat for bacterial infection - erythromycin ophthalmic ointment; Place 1 application into both eyes 3  (three) times daily.  Dispense: 3.5 g; Refill: 0 treat for 5-7 days - loratadine (CLARITIN) 10 MG tablet; Take 1 tablet (10 mg total) by mouth daily.  Dispense: 30 tablet; Refill: 11 -Follow up in 1 week for health maintenance and to recheck on eye

## 2017-06-30 NOTE — Patient Instructions (Signed)
I want you to use this erythromycin ointment 3 times a day on your eye.  Please follow-up in 3 days if no improvement or worsening.  Please follow-up with me in 1 week

## 2017-07-01 MED ORDER — LORATADINE 10 MG PO TABS: 10.0000 mg | ORAL_TABLET | Freq: Every day | ORAL | 11 refills | Status: DC

## 2017-07-01 MED ORDER — ERYTHROMYCIN 5 MG/GM OP OINT: 1.0000 "application " | TOPICAL_OINTMENT | Freq: Three times a day (TID) | OPHTHALMIC | 0 refills | Status: DC

## 2017-07-02 ENCOUNTER — Encounter: Payer: Self-pay | Admitting: Internal Medicine

## 2017-07-02 DIAGNOSIS — H109 Unspecified conjunctivitis: Secondary | ICD-10-CM | POA: Insufficient documentation

## 2017-07-02 NOTE — Assessment & Plan Note (Addendum)
Acute conjunctivitis of the left eye, does note some headache however able to improve with Aleve and has been ongoing for several days, from unlikely concern for glaucoma. no changes in eyesight.  Visual acuity 20/40 bilateral eyes.  Normal examination.  Given patient with headache and conjunctivitis with discharge noted in eye during visit will treat for bacterial infection - erythromycin ophthalmic ointment; Place 1 application into both eyes 3 (three) times daily.  Dispense: 3.5 g; Refill: 0 treat for 5-7 days - loratadine (CLARITIN) 10 MG tablet; Take 1 tablet (10 mg total) by mouth daily.  Dispense: 30 tablet; Refill: 11 -Follow up in 1 week for health maintenance and to recheck on eye

## 2017-07-23 ENCOUNTER — Other Ambulatory Visit: Payer: Self-pay

## 2017-07-23 MED ORDER — LISINOPRIL-HYDROCHLOROTHIAZIDE 20-25 MG PO TABS: 1.0000 | ORAL_TABLET | Freq: Every day | ORAL | 1 refills | Status: DC

## 2017-07-26 ENCOUNTER — Other Ambulatory Visit: Payer: Self-pay | Admitting: *Deleted

## 2017-07-26 DIAGNOSIS — E119 Type 2 diabetes mellitus without complications: Secondary | ICD-10-CM

## 2017-07-27 MED ORDER — ATORVASTATIN CALCIUM 40 MG PO TABS: 40.0000 mg | ORAL_TABLET | Freq: Every day | ORAL | 3 refills | Status: DC

## 2017-08-03 ENCOUNTER — Ambulatory Visit: Payer: BLUE CROSS/BLUE SHIELD | Admitting: Internal Medicine

## 2017-08-03 ENCOUNTER — Other Ambulatory Visit: Payer: Self-pay

## 2017-08-03 ENCOUNTER — Encounter: Payer: Self-pay | Admitting: Internal Medicine

## 2017-08-03 VITALS — BP 112/68 | HR 54 | Temp 98.0°F | Ht 67.0 in | Wt 190.8 lb

## 2017-08-03 DIAGNOSIS — Z0000 Encounter for general adult medical examination without abnormal findings: Secondary | ICD-10-CM

## 2017-08-03 DIAGNOSIS — E039 Hypothyroidism, unspecified: Secondary | ICD-10-CM

## 2017-08-03 DIAGNOSIS — Z23 Encounter for immunization: Secondary | ICD-10-CM | POA: Diagnosis not present

## 2017-08-03 DIAGNOSIS — I10 Essential (primary) hypertension: Secondary | ICD-10-CM | POA: Diagnosis not present

## 2017-08-03 DIAGNOSIS — E119 Type 2 diabetes mellitus without complications: Secondary | ICD-10-CM

## 2017-08-03 DIAGNOSIS — M5442 Lumbago with sciatica, left side: Secondary | ICD-10-CM | POA: Diagnosis not present

## 2017-08-03 MED ORDER — GABAPENTIN 100 MG PO CAPS: 100.0000 mg | ORAL_CAPSULE | Freq: Three times a day (TID) | ORAL | 3 refills | Status: DC

## 2017-08-03 MED ORDER — METFORMIN HCL 500 MG PO TABS: 1000.0000 mg | ORAL_TABLET | Freq: Two times a day (BID) | ORAL | 3 refills | Status: DC

## 2017-08-03 MED ORDER — SERTRALINE HCL 100 MG PO TABS: 100.0000 mg | ORAL_TABLET | Freq: Every day | ORAL | 0 refills | Status: DC

## 2017-08-03 MED ORDER — ASPIRIN 81 MG PO TBEC: 81.0000 mg | DELAYED_RELEASE_TABLET | Freq: Every day | ORAL | 11 refills | Status: DC

## 2017-08-03 NOTE — Progress Notes (Signed)
   [facility] Noralee CharsAsiyah Madden Piazza, MD Phone: [phone]  Reason For Visit: Follow-up  # CHRONIC DM, Type 2: Meds: Metformin 1000 mg BID  Reports good compliance. Tolerating well w/o side-effects Currently on ACEi / ARB  Had a eye exam done. Lifestyle: Diet okay  Denies polyuria, visual changes, numbness or tingling.  # CHRONIC HTN: Reports  Current Meds -lisinopril- hydrochlorothiazide Reports good compliance, took meds today. Tolerating well, w/o complaints. Denies CP, dyspnea, HA, edema, dizziness / lightheadedness  #Hypothyriodism  - Taking synthriod daily  -  Denies  fatigue, cold intolerance, weight gain, constipation, dry skin, myalgia, and menstrual irregularities  Health Maintenance  - [facility] -had a colonoscopy done which was completely normal  Past Medical History Reviewed problem list.  Medications- reviewed and updated No additions to family history Social history- patient is a non-smoker  Objective: BP 112/68   Pulse (!) 54   Temp 98 F (36.7 C) (Oral)   Ht 5\' 7"  (1.702 m)   Wt 190 lb 12.8 oz (86.5 kg)   SpO2 98%   BMI 29.88 kg/m  Gen: NAD, alert, cooperative with exam Cardio: regular rate and rhythm, S1S2 heard, no murmurs appreciated Pulm: clear to auscultation bilaterally, no wheezes, rhonchi or rales Extremities: warm, well perfused, No edema, cyanosis or clubbing;  MSK: Normal gait and station Skin: dry, intact, no rashes or lesions   Assessment/Plan: See problem based a/p  HYPERTENSION, BENIGN SYSTEMIC Well-controlled  continue lisinopril- hydrochlorothiazide BMET  Diabetes mellitus without complication (HCC) A1C 6.9 -Continue metformin thousand milligrams twice daily -Obtain CBC and lipid panel -Discussed patient getting an eye  exam  Healthcare maintenance Has not he had a colonoscopy recently and she states it was completely normal.  Hypothyroidism Well-controlled Continue Synthroid

## 2017-08-03 NOTE — Patient Instructions (Signed)
I seen you today.  Please follow-up with me in 4-5 months.  I will let you know if there are any concerns on your lab work.

## 2017-08-04 ENCOUNTER — Other Ambulatory Visit: Payer: Self-pay | Admitting: *Deleted

## 2017-08-04 LAB — CBC
Hematocrit: 33.2 % — ABNORMAL LOW (ref 34.0–46.6)
Hemoglobin: 11.1 g/dL (ref 11.1–15.9)
MCH: 28.9 pg (ref 26.6–33.0)
MCHC: 33.4 g/dL (ref 31.5–35.7)
MCV: 87 fL (ref 79–97)
PLATELETS: 289 10*3/uL (ref 150–379)
RBC: 3.84 x10E6/uL (ref 3.77–5.28)
RDW: 14.7 % (ref 12.3–15.4)
WBC: 6.5 10*3/uL (ref 3.4–10.8)

## 2017-08-04 LAB — BASIC METABOLIC PANEL
BUN / CREAT RATIO: 17 (ref 12–28)
BUN: 15 mg/dL (ref 8–27)
CHLORIDE: 102 mmol/L (ref 96–106)
CO2: 28 mmol/L (ref 20–29)
CREATININE: 0.87 mg/dL (ref 0.57–1.00)
Calcium: 9.5 mg/dL (ref 8.7–10.3)
GFR calc Af Amer: 80 mL/min/{1.73_m2} (ref >59)
GFR calc non Af Amer: 69 mL/min/{1.73_m2} (ref >59)
GLUCOSE: 90 mg/dL (ref 65–99)
POTASSIUM: 3.8 mmol/L (ref 3.5–5.2)
SODIUM: 143 mmol/L (ref 134–144)

## 2017-08-04 LAB — TSH: TSH: 2 u[IU]/mL (ref 0.450–4.500)

## 2017-08-04 LAB — LIPID PANEL
CHOLESTEROL TOTAL: 121 mg/dL (ref 100–199)
Chol/HDL Ratio: 2.1 ratio (ref 0.0–4.4)
HDL: 57 mg/dL (ref >39)
LDL CALC: 32 mg/dL (ref 0–99)
TRIGLYCERIDES: 160 mg/dL — AB (ref 0–149)
VLDL Cholesterol Cal: 32 mg/dL (ref 5–40)

## 2017-08-05 ENCOUNTER — Other Ambulatory Visit: Payer: Self-pay | Admitting: *Deleted

## 2017-08-05 MED ORDER — SERTRALINE HCL 100 MG PO TABS: 100.0000 mg | ORAL_TABLET | Freq: Every day | ORAL | 0 refills | Status: DC

## 2017-08-05 NOTE — Assessment & Plan Note (Signed)
Well-controlled.  Continue Synthroid. 

## 2017-08-05 NOTE — Assessment & Plan Note (Signed)
Well-controlled  continue lisinopril- hydrochlorothiazide BMET

## 2017-08-05 NOTE — Assessment & Plan Note (Addendum)
Has not he had a colonoscopy recently and she states it was completely normal.

## 2017-08-05 NOTE — Assessment & Plan Note (Signed)
A1C 6.9 -Continue metformin thousand milligrams twice daily -Obtain CBC and lipid panel -Discussed patient getting an eye  exam

## 2017-08-06 ENCOUNTER — Encounter: Payer: Self-pay | Admitting: Internal Medicine

## 2017-08-06 MED ORDER — SERTRALINE HCL 100 MG PO TABS: 100.0000 mg | ORAL_TABLET | Freq: Every day | ORAL | 0 refills | Status: DC

## 2017-09-01 ENCOUNTER — Telehealth: Payer: Self-pay

## 2017-09-01 NOTE — Telephone Encounter (Signed)
Pt called nurse line, requesting to speak to Dr. Cathlean CowerMikell, would not elaborate other than to say "it is very important." Pt call back [phone] Shawna OrleansMeredith B Thomsen, RN

## 2017-09-02 NOTE — Telephone Encounter (Signed)
Called patient back discussed her concern regarding paperwork. Issue was resolved.

## 2017-09-15 ENCOUNTER — Other Ambulatory Visit: Payer: Self-pay | Admitting: Family Medicine

## 2017-09-15 DIAGNOSIS — Z139 Encounter for screening, unspecified: Secondary | ICD-10-CM

## 2017-09-17 ENCOUNTER — Other Ambulatory Visit: Payer: Self-pay

## 2017-09-17 MED ORDER — LEVOTHYROXINE SODIUM 50 MCG PO TABS: 50.0000 ug | ORAL_TABLET | Freq: Every day | ORAL | 1 refills | Status: DC

## 2017-10-06 ENCOUNTER — Ambulatory Visit
Admission: RE | Admit: 2017-10-06 | Discharge: 2017-10-06 | Disposition: A | Discharge status: Home / Self Care | Payer: BLUE CROSS/BLUE SHIELD | Source: Ambulatory Visit | Attending: Family Medicine | Admitting: Family Medicine

## 2017-10-06 DIAGNOSIS — Z139 Encounter for screening, unspecified: Secondary | ICD-10-CM

## 2017-10-06 DIAGNOSIS — Z1231 Encounter for screening mammogram for malignant neoplasm of breast: Secondary | ICD-10-CM | POA: Diagnosis not present

## 2017-12-28 DIAGNOSIS — E119 Type 2 diabetes mellitus without complications: Secondary | ICD-10-CM | POA: Diagnosis not present

## 2017-12-28 DIAGNOSIS — H25042 Posterior subcapsular polar age-related cataract, left eye: Secondary | ICD-10-CM | POA: Diagnosis not present

## 2017-12-28 DIAGNOSIS — H25011 Cortical age-related cataract, right eye: Secondary | ICD-10-CM | POA: Diagnosis not present

## 2017-12-28 LAB — HM DIABETES EYE EXAM

## 2018-02-02 ENCOUNTER — Other Ambulatory Visit: Payer: Self-pay | Admitting: *Deleted

## 2018-02-02 MED ORDER — LISINOPRIL-HYDROCHLOROTHIAZIDE 20-25 MG PO TABS: 1.0000 | ORAL_TABLET | Freq: Every day | ORAL | 1 refills | Status: DC

## 2018-02-03 ENCOUNTER — Encounter: Payer: Self-pay | Admitting: Family Medicine

## 2018-02-03 ENCOUNTER — Other Ambulatory Visit: Payer: Self-pay

## 2018-02-03 MED ORDER — SERTRALINE HCL 100 MG PO TABS: 100.0000 mg | ORAL_TABLET | Freq: Every day | ORAL | 0 refills | Status: DC

## 2018-02-09 ENCOUNTER — Encounter: Payer: Self-pay | Admitting: Family Medicine

## 2018-02-09 ENCOUNTER — Other Ambulatory Visit: Payer: Self-pay

## 2018-02-09 ENCOUNTER — Ambulatory Visit: Payer: BLUE CROSS/BLUE SHIELD | Admitting: Family Medicine

## 2018-02-09 VITALS — BP 124/68 | HR 61 | Temp 98.4°F | Ht 67.0 in | Wt 187.6 lb

## 2018-02-09 DIAGNOSIS — M25551 Pain in right hip: Secondary | ICD-10-CM

## 2018-02-09 DIAGNOSIS — E119 Type 2 diabetes mellitus without complications: Secondary | ICD-10-CM | POA: Diagnosis not present

## 2018-02-09 LAB — POCT GLYCOSYLATED HEMOGLOBIN (HGB A1C): HBA1C, POC (CONTROLLED DIABETIC RANGE): 6.9 % (ref 0.0–7.0)

## 2018-02-09 MED ORDER — MELOXICAM 15 MG PO TABS: 15.0000 mg | ORAL_TABLET | Freq: Every day | ORAL | 0 refills | Status: DC

## 2018-02-09 NOTE — Patient Instructions (Addendum)
It was great to meet you today! Thank you for letting me participate in your care!  Today, we discussed your right hip pain. I suspect that you have right hip arthritis and we will try conservative management first to minimize the pain. I have written you a prescription [facility] which is an anti-inflammatory medication to help with the pain. Please take it as prescribed. Please STOP taking all other over the counter pain medications such as Alleve, Naproxen, Ibuprofen, etc. I will also call you with the results of your right hip x-ray.  If you have little to no improvement, please return to the clinic and I will refer you to sports medicine where you may benefit from a steroid injection.  Your diabetes is well controlled. I will not make any adjustments to those medications today.  Be well, Jules Schickim Matin Mattioli, DO PGY-2, [facility]

## 2018-02-09 NOTE — Progress Notes (Signed)
Subjective: Chief Complaint  Patient presents with  . Diabetes  . right hip and leg pain    HPI: Candice AdieLinda J Kertz is a 67 y.o. presenting to clinic today to discuss the following:  DM Type 2 Follow Up Patient A1c is below goal of 7.0 today (6.9). She is checking her blood sugar daily and is not having any issues taking or obtaining her medications. Counseled patient to continue checking her blood sugar daily, continue same medication regimen with no changes today, and continue to eat a balanced diet and exercise.  Right Leg Pain Patient states she has right sided hip pain that she notices if she lays on it and when she wakes up at night. It is not made worse or better with walking but does hurt more when she moves it. She states one day her right leg "gave away" and she had a fall. The pain did not change after the fall. She describes the pain as a "deep ache" with no numbness, tingling, or burning. She states it radiates from her hip and goes down her leg to the knee. She has not tried any OTC medications or exercises. She states she was told she had arthritis in her left hip. She has no imaging of her right hip.  She denies fever, chills, chest pain, SOB,   Health Maintenance: none due today     ROS noted in HPI.   Past Medical, Surgical, Social, and Family History Reviewed & Updated per EMR.   Pertinent Historical Findings include:   Social History   Tobacco Use  Smoking Status Never Smoker  Smokeless Tobacco Never Used    Objective: BP 124/68   Pulse 61   Temp 98.4 F (36.9 C) (Oral)   Ht 5\' 7"  (1.702 m)   Wt 187 lb 9.6 oz (85.1 kg)   SpO2 98%   BMI 29.38 kg/m  Vitals and nursing notes reviewed  Physical Exam Gen: Alert and Oriented x 3, NAD HEENT: Normocephalic, atraumatic, PERRLA, EOMI CV: RRR, 2/6 systolic murmur, normal S1, S2 split Resp: CTAB, no wheezing, rales, or rhonchi, comfortable work of breathing MSK: Moves all four extremities; right hip  non-tender to palpation at the greater trochanter, pain with internal and external rotation and on hip joint compression bilaterally, FROM of right hip, positive FABER test bilaterally, bilateral knee crepitus Ext: no clubbing, cyanosis, or edema Neuro: No gross deficits Skin: warm, dry, intact, no rashes  Results for orders placed or performed in visit on 02/09/18 (from the past 72 hour(s))  HgB A1c     Status: None   Collection Time: 02/09/18  3:19 PM  Result Value Ref Range   Hemoglobin A1C     HbA1c POC (<> result, manual entry)     HbA1c, POC (prediabetic range)     HbA1c, POC (controlled diabetic range) 6.9 0.0 - 7.0 %    Assessment/Plan:  Diabetes mellitus without complication (HCC) Stable and well controlled. A1c of 6.9. Advised patient to continue medications and diet with exercise.  Right hip pain Patient has findings consistent with osteoarthritis in her left hip and now has similar pain in her right hip. No gait abnormalities and physical exam is consistent with arthritis of the hip. X-ray of hip today to check for arthritic changes and to rule out femoral neck fracture given that she did have a fall. I do not suspect femoral neck fracture as she denies any groin pain. Trochanteric bursitis considered but unlikely as patient  was not TTP at that site. - X-ray of the right hip AP, Lateral, and Frog leg  - Meloxicam for 10 days to see if that improves her pain - Hip exercises to help with pain and strength supporting musculature - If x-ray shows arthritic changes and she does not get better with NSAIDS and exercises I would send to Sports Medicine to have steroid injection under ultrasound guidance   PATIENT EDUCATION PROVIDED: See AVS    Diagnosis and plan along with any newly prescribed medication(s) were discussed in detail with this patient today. The patient verbalized understanding and agreed with the plan. Patient advised if symptoms worsen return to clinic or ER.    Health Maintainance:   Orders Placed This Encounter  Procedures  . DG HIP UNILAT WITH PELVIS 2-3 VIEWS RIGHT    Standing Status:   Future    Standing Expiration Date:   08/12/2018    Order Specific Question:   Reason for Exam (SYMPTOM  OR DIAGNOSIS REQUIRED)    Answer:   hip pain    Order Specific Question:   Preferred imaging location?    Answer:   [facility]    Order Specific Question:   Radiology Contrast Protocol - do NOT remove file path    Answer:   [email or web address]  . HgB A1c    Meds ordered this encounter  Medications  . DISCONTD: meloxicam (MOBIC) 15 MG tablet    Sig: Take 1 tablet (15 mg total) by mouth daily.    Dispense:  30 tablet    Refill:  0  . meloxicam (MOBIC) 15 MG tablet    Sig: Take 1 tablet (15 mg total) by mouth daily.    Dispense:  10 tablet    Refill:  0     Jules Schick, DO 02/09/2018, 3:47 PM PGY-2 [facility]

## 2018-02-10 NOTE — Assessment & Plan Note (Signed)
Patient has findings consistent with osteoarthritis in her left hip and now has similar pain in her right hip. No gait abnormalities and physical exam is consistent with arthritis of the hip. X-ray of hip today to check for arthritic changes and to rule out femoral neck fracture given that she did have a fall. I do not suspect femoral neck fracture as she denies any groin pain. Trochanteric bursitis considered but unlikely as patient was not TTP at that site. - X-ray of the right hip AP, Lateral, and Frog leg  - Meloxicam for 10 days to see if that improves her pain - Hip exercises to help with pain and strength supporting musculature - If x-ray shows arthritic changes and she does not get better with NSAIDS and exercises I would send to Sports Medicine to have steroid injection under ultrasound guidance

## 2018-02-10 NOTE — Assessment & Plan Note (Signed)
Stable and well controlled. A1c of 6.9. Advised patient to continue medications and diet with exercise.

## 2018-02-15 ENCOUNTER — Ambulatory Visit (HOSPITAL_COMMUNITY)
Admission: RE | Admit: 2018-02-15 | Discharge: 2018-02-15 | Disposition: A | Discharge status: Home / Self Care | Payer: BLUE CROSS/BLUE SHIELD | Source: Ambulatory Visit | Attending: Family Medicine | Admitting: Family Medicine

## 2018-02-15 DIAGNOSIS — M25551 Pain in right hip: Secondary | ICD-10-CM

## 2018-02-15 DIAGNOSIS — M160 Bilateral primary osteoarthritis of hip: Secondary | ICD-10-CM | POA: Diagnosis not present

## 2018-02-15 DIAGNOSIS — M25851 Other specified joint disorders, right hip: Secondary | ICD-10-CM | POA: Diagnosis not present

## 2018-03-05 ENCOUNTER — Other Ambulatory Visit: Payer: Self-pay | Admitting: Family Medicine

## 2018-03-07 ENCOUNTER — Telehealth: Payer: Self-pay | Admitting: *Deleted

## 2018-03-07 NOTE — Telephone Encounter (Signed)
-----   Message from Arlyce Harmanimothy Lockamy, DO sent at 03/07/2018  9:31 AM EDT ----- Regarding: Meloxicam Refill I have refilled Meloxicam for patient Candice Warner but would like for us to reach out to her if possible. I only gave her 5 pills b/c if she is not better I would like her to come in and get reevaluated. I may also need to do some blood work to make sure she is tolerating the medication (it can affect kidney function).  Thanks!  Tim

## 2018-03-07 NOTE — Telephone Encounter (Signed)
Spoke with pt and said she was under the weather last week. She had an appt on the 29th, but I gave her an earlier appt to follow up with you. Ulonda Klosowski Bruna PotterBlount, CMA

## 2018-03-15 ENCOUNTER — Ambulatory Visit (INDEPENDENT_AMBULATORY_CARE_PROVIDER_SITE_OTHER): Payer: BLUE CROSS/BLUE SHIELD | Admitting: Family Medicine

## 2018-03-15 ENCOUNTER — Encounter: Payer: Self-pay | Admitting: Family Medicine

## 2018-03-15 ENCOUNTER — Other Ambulatory Visit: Payer: Self-pay

## 2018-03-15 VITALS — BP 124/62 | HR 61 | Temp 98.8°F | Ht 67.0 in | Wt 192.6 lb

## 2018-03-15 DIAGNOSIS — M25551 Pain in right hip: Secondary | ICD-10-CM | POA: Diagnosis not present

## 2018-03-15 DIAGNOSIS — E162 Hypoglycemia, unspecified: Secondary | ICD-10-CM | POA: Diagnosis not present

## 2018-03-15 DIAGNOSIS — Z23 Encounter for immunization: Secondary | ICD-10-CM | POA: Diagnosis not present

## 2018-03-15 NOTE — Patient Instructions (Signed)
It was great to see you today! Thank you for letting me participate in your care!  Today, we discussed your hip pain and it is due to arthritis. Continue taking Meloxicam as needed since you only need it twice a week. If you start needing it more please return to the clinic and we will consider a steroid injection.   Please check your blood sugar every morning. Keep candy on you at all times in case your blood sugar drops too low. Please make sure you eat something to keep your blood sugar from getting to low and keep taking your medications for diabetes. I will see you in 3 months.  Be well, Jules Schick, DO PGY-2, [facility]

## 2018-03-15 NOTE — Progress Notes (Signed)
     Subjective: Chief Complaint  Patient presents with  . Follow-up     HPI: Candice Warner is a 67 y.o. presenting to clinic today to discuss the following:  Follow up Hip Pain Patient states her hip pain has improved greatly and meloxicam is adequately relieving her pain. She is taking it with food and now only needs to take it about twice a week.  Hypoglycemia Patient states she has had episodes of hypoglycemia. Her blood glucose has gotten down into the low 50s. She sat down, drank a pepsi, ate a snack and felt better. She has not changes any of her eating habits and states she has been taking her medications as prescribed. No new medications and nothing new OTC. She is not checking her blood sugar on a regular basis.  Health Maintenance: influenza vaccine     ROS noted in HPI.   Past Medical, Surgical, Social, and Family History Reviewed & Updated per EMR.   Pertinent Historical Findings include:   Social History   Tobacco Use  Smoking Status Never Smoker  Smokeless Tobacco Never Used   Objective: BP 124/62   Pulse 61   Temp 98.8 F (37.1 C) (Oral)   Ht 5\' 7"  (1.702 m)   Wt 192 lb 9.6 oz (87.4 kg)   SpO2 96%   BMI 30.17 kg/m  Vitals and nursing notes reviewed  Physical Exam Gen: Alert and Oriented x 3, NAD HEENT: Normocephalic, atraumatic CV: RRR, no murmurs, normal S1, S2 split Resp: CTAB, no wheezing, rales, or rhonchi, comfortable work of breathing MSK: Moves all four extremities; right hip strength 5/5 in abduction, adduction, internal and external rotation FROM without pain Ext: no clubbing, cyanosis, or edema Neuro: No gross deficits Skin: warm, dry, intact, no rashes  No results found for this or any previous visit (from the past 72 hour(s)).  Assessment/Plan:  Right hip pain X-rays of hip did reveal arthritic changes that seem to be well controlled on NSAIDS at this time.  - Continue to monitor and consider ortho consult if pain continues  for more aggressive therapy and possible corticosteroid injection - Meloxicam only as needed, take with food  Hypoglycemia Instructed patient to begin checking her blood sugar every morning and to start eating regularly scheduled meals, smaller portions and to have light snacks in between meals. Patient instructed to have some candy or something with real sugar on her at all times to ensure she does can raise her blood sugar enough to get help in the event of another episode of hypoglycemia.   PATIENT EDUCATION PROVIDED: See AVS    Diagnosis and plan along with any newly prescribed medication(s) were discussed in detail with this patient today. The patient verbalized understanding and agreed with the plan. Patient advised if symptoms worsen return to clinic or ER.   Health Maintainance:   Orders Placed This Encounter  Procedures  . Flu Vaccine QUAD 36+ mos IM    No orders of the defined types were placed in this encounter.    Jules Schick, DO 03/15/2018, 4:22 PM PGY-2  Family Medicine

## 2018-03-16 ENCOUNTER — Other Ambulatory Visit: Payer: Self-pay

## 2018-03-17 MED ORDER — LEVOTHYROXINE SODIUM 50 MCG PO TABS: 50.0000 ug | ORAL_TABLET | Freq: Every day | ORAL | 1 refills | Status: DC

## 2018-03-18 DIAGNOSIS — E162 Hypoglycemia, unspecified: Secondary | ICD-10-CM | POA: Insufficient documentation

## 2018-03-18 NOTE — Assessment & Plan Note (Signed)
X-rays of hip did reveal arthritic changes that seem to be well controlled on NSAIDS at this time.  - Continue to monitor and consider ortho consult if pain continues for more aggressive therapy and possible corticosteroid injection - Meloxicam only as needed, take with food

## 2018-03-18 NOTE — Assessment & Plan Note (Signed)
Instructed patient to begin checking her blood sugar every morning and to start eating regularly scheduled meals, smaller portions and to have light snacks in between meals. Patient instructed to have some candy or something with real sugar on her at all times to ensure she does can raise her blood sugar enough to get help in the event of another episode of hypoglycemia.

## 2018-04-12 ENCOUNTER — Ambulatory Visit: Payer: BLUE CROSS/BLUE SHIELD | Admitting: Family Medicine

## 2018-06-27 ENCOUNTER — Emergency Department (HOSPITAL_COMMUNITY): Payer: BLUE CROSS/BLUE SHIELD

## 2018-06-27 ENCOUNTER — Observation Stay (HOSPITAL_COMMUNITY)
Admission: EM | Admit: 2018-06-27 | Discharge: 2018-06-29 | Disposition: A | Discharge status: Home / Self Care | Payer: BLUE CROSS/BLUE SHIELD | Attending: Family Medicine | Admitting: Family Medicine

## 2018-06-27 ENCOUNTER — Other Ambulatory Visit: Payer: Self-pay

## 2018-06-27 ENCOUNTER — Encounter (HOSPITAL_COMMUNITY): Payer: Self-pay | Admitting: Emergency Medicine

## 2018-06-27 DIAGNOSIS — R42 Dizziness and giddiness: Secondary | ICD-10-CM | POA: Diagnosis not present

## 2018-06-27 DIAGNOSIS — E669 Obesity, unspecified: Secondary | ICD-10-CM | POA: Diagnosis not present

## 2018-06-27 DIAGNOSIS — R110 Nausea: Secondary | ICD-10-CM | POA: Diagnosis not present

## 2018-06-27 DIAGNOSIS — R351 Nocturia: Secondary | ICD-10-CM | POA: Insufficient documentation

## 2018-06-27 DIAGNOSIS — F329 Major depressive disorder, single episode, unspecified: Secondary | ICD-10-CM | POA: Insufficient documentation

## 2018-06-27 DIAGNOSIS — R001 Bradycardia, unspecified: Secondary | ICD-10-CM | POA: Diagnosis not present

## 2018-06-27 DIAGNOSIS — M542 Cervicalgia: Secondary | ICD-10-CM | POA: Insufficient documentation

## 2018-06-27 DIAGNOSIS — G8929 Other chronic pain: Secondary | ICD-10-CM | POA: Insufficient documentation

## 2018-06-27 DIAGNOSIS — R9431 Abnormal electrocardiogram [ECG] [EKG]: Secondary | ICD-10-CM | POA: Diagnosis not present

## 2018-06-27 DIAGNOSIS — Z791 Long term (current) use of non-steroidal anti-inflammatories (NSAID): Secondary | ICD-10-CM | POA: Insufficient documentation

## 2018-06-27 DIAGNOSIS — Z79899 Other long term (current) drug therapy: Secondary | ICD-10-CM | POA: Insufficient documentation

## 2018-06-27 DIAGNOSIS — N179 Acute kidney failure, unspecified: Secondary | ICD-10-CM | POA: Diagnosis not present

## 2018-06-27 DIAGNOSIS — S0990XA Unspecified injury of head, initial encounter: Secondary | ICD-10-CM | POA: Diagnosis not present

## 2018-06-27 DIAGNOSIS — A35 Other tetanus: Secondary | ICD-10-CM | POA: Insufficient documentation

## 2018-06-27 DIAGNOSIS — E785 Hyperlipidemia, unspecified: Secondary | ICD-10-CM | POA: Diagnosis not present

## 2018-06-27 DIAGNOSIS — M1611 Unilateral primary osteoarthritis, right hip: Secondary | ICD-10-CM | POA: Diagnosis not present

## 2018-06-27 DIAGNOSIS — E039 Hypothyroidism, unspecified: Secondary | ICD-10-CM | POA: Diagnosis not present

## 2018-06-27 DIAGNOSIS — Z8249 Family history of ischemic heart disease and other diseases of the circulatory system: Secondary | ICD-10-CM | POA: Diagnosis not present

## 2018-06-27 DIAGNOSIS — I10 Essential (primary) hypertension: Secondary | ICD-10-CM | POA: Diagnosis not present

## 2018-06-27 DIAGNOSIS — S0101XA Laceration without foreign body of scalp, initial encounter: Secondary | ICD-10-CM | POA: Diagnosis not present

## 2018-06-27 DIAGNOSIS — E876 Hypokalemia: Secondary | ICD-10-CM | POA: Diagnosis not present

## 2018-06-27 DIAGNOSIS — R0602 Shortness of breath: Secondary | ICD-10-CM | POA: Diagnosis not present

## 2018-06-27 DIAGNOSIS — W19XXXA Unspecified fall, initial encounter: Secondary | ICD-10-CM | POA: Insufficient documentation

## 2018-06-27 DIAGNOSIS — D649 Anemia, unspecified: Secondary | ICD-10-CM | POA: Diagnosis not present

## 2018-06-27 DIAGNOSIS — E1140 Type 2 diabetes mellitus with diabetic neuropathy, unspecified: Secondary | ICD-10-CM | POA: Insufficient documentation

## 2018-06-27 DIAGNOSIS — Z6831 Body mass index (BMI) 31.0-31.9, adult: Secondary | ICD-10-CM | POA: Insufficient documentation

## 2018-06-27 DIAGNOSIS — M25551 Pain in right hip: Secondary | ICD-10-CM | POA: Insufficient documentation

## 2018-06-27 DIAGNOSIS — Z79890 Hormone replacement therapy (postmenopausal): Secondary | ICD-10-CM | POA: Insufficient documentation

## 2018-06-27 DIAGNOSIS — M545 Low back pain: Secondary | ICD-10-CM | POA: Insufficient documentation

## 2018-06-27 DIAGNOSIS — M6281 Muscle weakness (generalized): Secondary | ICD-10-CM | POA: Insufficient documentation

## 2018-06-27 DIAGNOSIS — E119 Type 2 diabetes mellitus without complications: Secondary | ICD-10-CM

## 2018-06-27 DIAGNOSIS — I959 Hypotension, unspecified: Secondary | ICD-10-CM | POA: Diagnosis not present

## 2018-06-27 DIAGNOSIS — R55 Syncope and collapse: Principal | ICD-10-CM

## 2018-06-27 DIAGNOSIS — Z7984 Long term (current) use of oral hypoglycemic drugs: Secondary | ICD-10-CM | POA: Insufficient documentation

## 2018-06-27 DIAGNOSIS — E860 Dehydration: Secondary | ICD-10-CM | POA: Insufficient documentation

## 2018-06-27 DIAGNOSIS — Z7982 Long term (current) use of aspirin: Secondary | ICD-10-CM | POA: Insufficient documentation

## 2018-06-27 DIAGNOSIS — S199XXA Unspecified injury of neck, initial encounter: Secondary | ICD-10-CM | POA: Diagnosis not present

## 2018-06-27 HISTORY — DX: Type 2 diabetes mellitus without complications: E11.9

## 2018-06-27 HISTORY — DX: Unspecified osteoarthritis, unspecified site: M19.90

## 2018-06-27 LAB — URINALYSIS, ROUTINE W REFLEX MICROSCOPIC
Bilirubin Urine: NEGATIVE
GLUCOSE, UA: NEGATIVE mg/dL
Ketones, ur: NEGATIVE mg/dL
NITRITE: NEGATIVE
Protein, ur: 30 mg/dL — AB
Specific Gravity, Urine: 1.035 — ABNORMAL HIGH (ref 1.005–1.030)
pH: 7 (ref 5.0–8.0)

## 2018-06-27 LAB — COMPREHENSIVE METABOLIC PANEL
ALT: 17 U/L (ref 0–44)
ANION GAP: 15 (ref 5–15)
AST: 25 U/L (ref 15–41)
Albumin: 4.1 g/dL (ref 3.5–5.0)
Alkaline Phosphatase: 68 U/L (ref 38–126)
BUN: 22 mg/dL (ref 8–23)
CHLORIDE: 103 mmol/L (ref 98–111)
CO2: 22 mmol/L (ref 22–32)
Calcium: 9.5 mg/dL (ref 8.9–10.3)
Creatinine, Ser: 1.93 mg/dL — ABNORMAL HIGH (ref 0.44–1.00)
GFR, EST AFRICAN AMERICAN: 30 mL/min — AB (ref >60)
GFR, EST NON AFRICAN AMERICAN: 26 mL/min — AB (ref >60)
Glucose, Bld: 148 mg/dL — ABNORMAL HIGH (ref 70–99)
POTASSIUM: 3 mmol/L — AB (ref 3.5–5.1)
Sodium: 140 mmol/L (ref 135–145)
Total Bilirubin: 1.2 mg/dL (ref 0.3–1.2)
Total Protein: 8.3 g/dL — ABNORMAL HIGH (ref 6.5–8.1)

## 2018-06-27 LAB — CBC WITH DIFFERENTIAL/PLATELET
ABS IMMATURE GRANULOCYTES: 0.03 10*3/uL (ref 0.00–0.07)
BASOS ABS: 0.1 10*3/uL (ref 0.0–0.1)
Basophils Relative: 1 %
Eosinophils Absolute: 0.1 10*3/uL (ref 0.0–0.5)
Eosinophils Relative: 2 %
HCT: 35.8 % — ABNORMAL LOW (ref 36.0–46.0)
HEMOGLOBIN: 11.5 g/dL — AB (ref 12.0–15.0)
Immature Granulocytes: 0 %
LYMPHS PCT: 44 %
Lymphs Abs: 3.1 10*3/uL (ref 0.7–4.0)
MCH: 29 pg (ref 26.0–34.0)
MCHC: 32.1 g/dL (ref 30.0–36.0)
MCV: 90.4 fL (ref 80.0–100.0)
Monocytes Absolute: 0.6 10*3/uL (ref 0.1–1.0)
Monocytes Relative: 9 %
NEUTROS ABS: 3.1 10*3/uL (ref 1.7–7.7)
NEUTROS PCT: 44 %
NRBC: 0 % (ref 0.0–0.2)
Platelets: 325 10*3/uL (ref 150–400)
RBC: 3.96 MIL/uL (ref 3.87–5.11)
RDW: 13 % (ref 11.5–15.5)
WBC: 7 10*3/uL (ref 4.0–10.5)

## 2018-06-27 LAB — I-STAT TROPONIN, ED: Troponin i, poc: 0 ng/mL (ref 0.00–0.08)

## 2018-06-27 LAB — D-DIMER, QUANTITATIVE: D-Dimer, Quant: 15.34 ug/mL-FEU — ABNORMAL HIGH (ref 0.00–0.50)

## 2018-06-27 LAB — GLUCOSE, CAPILLARY: Glucose-Capillary: 122 mg/dL — ABNORMAL HIGH (ref 70–99)

## 2018-06-27 MED ORDER — ATORVASTATIN CALCIUM 40 MG PO TABS
40.0000 mg | ORAL_TABLET | Freq: Every day | ORAL | Status: DC
  Administered 2018-06-28 – 2018-06-29 (×2): 40 mg via ORAL
  Filled 2018-06-27 (×2): qty 1

## 2018-06-27 MED ORDER — HEPARIN SODIUM (PORCINE) 5000 UNIT/ML IJ SOLN
5000.0000 [IU] | Freq: Three times a day (TID) | INTRAMUSCULAR | Status: DC
  Administered 2018-06-27 – 2018-06-29 (×5): 5000 [IU] via SUBCUTANEOUS
  Filled 2018-06-27 (×5): qty 1

## 2018-06-27 MED ORDER — INSULIN ASPART 100 UNIT/ML ~~LOC~~ SOLN
0.0000 [IU] | Freq: Three times a day (TID) | SUBCUTANEOUS | Status: DC
  Administered 2018-06-28: 1 [IU] via SUBCUTANEOUS
  Administered 2018-06-28 (×2): 2 [IU] via SUBCUTANEOUS
  Administered 2018-06-29: 1 [IU] via SUBCUTANEOUS

## 2018-06-27 MED ORDER — ENSURE ENLIVE PO LIQD
237.0000 mL | Freq: Two times a day (BID) | ORAL | Status: DC
  Administered 2018-06-28 – 2018-06-29 (×3): 237 mL via ORAL
  Filled 2018-06-27 (×4): qty 237

## 2018-06-27 MED ORDER — GABAPENTIN 100 MG PO CAPS
100.0000 mg | ORAL_CAPSULE | Freq: Two times a day (BID) | ORAL | Status: DC
  Administered 2018-06-27 – 2018-06-29 (×4): 100 mg via ORAL
  Filled 2018-06-27 (×4): qty 1

## 2018-06-27 MED ORDER — ASPIRIN EC 81 MG PO TBEC
81.0000 mg | DELAYED_RELEASE_TABLET | Freq: Every day | ORAL | Status: DC
  Administered 2018-06-28 – 2018-06-29 (×2): 81 mg via ORAL
  Filled 2018-06-27 (×2): qty 1

## 2018-06-27 MED ORDER — ACETAMINOPHEN 325 MG PO TABS
650.0000 mg | ORAL_TABLET | Freq: Four times a day (QID) | ORAL | Status: DC | PRN
  Administered 2018-06-28 (×2): 650 mg via ORAL
  Filled 2018-06-27 (×2): qty 2

## 2018-06-27 MED ORDER — SODIUM CHLORIDE 0.9% FLUSH
3.0000 mL | Freq: Two times a day (BID) | INTRAVENOUS | Status: DC
  Administered 2018-06-27 – 2018-06-29 (×4): 3 mL via INTRAVENOUS

## 2018-06-27 MED ORDER — LIDOCAINE-EPINEPHRINE (PF) 2 %-1:200000 IJ SOLN
10.0000 mL | Freq: Once | INTRAMUSCULAR | Status: AC
  Administered 2018-06-27: 10 mL
  Filled 2018-06-27: qty 20

## 2018-06-27 MED ORDER — SODIUM CHLORIDE 0.9 % IV BOLUS
1000.0000 mL | Freq: Once | INTRAVENOUS | Status: AC
  Administered 2018-06-27: 1000 mL via INTRAVENOUS

## 2018-06-27 MED ORDER — IOPAMIDOL (ISOVUE-370) INJECTION 76%
INTRAVENOUS | Status: AC
  Administered 2018-06-27: 18:00:00
  Filled 2018-06-27: qty 100

## 2018-06-27 MED ORDER — LORATADINE 10 MG PO TABS
10.0000 mg | ORAL_TABLET | Freq: Every day | ORAL | Status: DC
  Administered 2018-06-28 – 2018-06-29 (×2): 10 mg via ORAL
  Filled 2018-06-27 (×2): qty 1

## 2018-06-27 MED ORDER — POTASSIUM CHLORIDE CRYS ER 20 MEQ PO TBCR
40.0000 meq | EXTENDED_RELEASE_TABLET | Freq: Once | ORAL | Status: AC
  Administered 2018-06-28: 40 meq via ORAL
  Filled 2018-06-27: qty 2

## 2018-06-27 MED ORDER — SERTRALINE HCL 100 MG PO TABS
100.0000 mg | ORAL_TABLET | Freq: Every day | ORAL | Status: DC
  Administered 2018-06-28 – 2018-06-29 (×2): 100 mg via ORAL
  Filled 2018-06-27 (×2): qty 1

## 2018-06-27 MED ORDER — LEVOTHYROXINE SODIUM 50 MCG PO TABS
50.0000 ug | ORAL_TABLET | Freq: Every day | ORAL | Status: DC
  Administered 2018-06-28 – 2018-06-29 (×2): 50 ug via ORAL
  Filled 2018-06-27 (×2): qty 1

## 2018-06-27 MED ORDER — ACETAMINOPHEN 650 MG RE SUPP: 650.0000 mg | Freq: Four times a day (QID) | RECTAL | Status: DC | PRN

## 2018-06-27 MED ORDER — POTASSIUM CHLORIDE CRYS ER 20 MEQ PO TBCR
40.0000 meq | EXTENDED_RELEASE_TABLET | Freq: Once | ORAL | Status: AC
  Administered 2018-06-27: 40 meq via ORAL
  Filled 2018-06-27: qty 2

## 2018-06-27 MED ORDER — TETANUS-DIPHTH-ACELL PERTUSSIS 5-2.5-18.5 LF-MCG/0.5 IM SUSP
0.5000 mL | Freq: Once | INTRAMUSCULAR | Status: AC
  Administered 2018-06-27: 0.5 mL via INTRAMUSCULAR
  Filled 2018-06-27: qty 0.5

## 2018-06-27 NOTE — ED Notes (Signed)
Patient transported to X-ray 

## 2018-06-27 NOTE — ED Notes (Signed)
Patient transported to CT 

## 2018-06-27 NOTE — ED Triage Notes (Signed)
Pt arrived via EMS due to LOC following prior n/v & dizziness at approximately 1115. Unknown downtime, found on the floor by a coworker with a laceration to posterior head. No bleeding upon arrival.

## 2018-06-27 NOTE — Progress Notes (Signed)
Pt admitted to the unit from ED. Pt arrived to the unit via stretcher and ambulated to bed in room. Pt alert and verbally responsive; pt oriented to the unit and room; fall/safety precaution and prevention education completed. Telemetry applied and verified; skin clean, dry and intact with no pressure ulcers or opened wounds noted except for 1 staple to posterior head clean,dry and intact. Pt in bed with call light within reach and bed alarm on. Will continue to closely monitor. Dionne Bucy. [place] Massar RN   06/27/18 2108  Vitals  Temp 98.1 F (36.7 C)  Temp Source Oral  BP (!) 133/58  BP Location Left Arm  BP Method Automatic  Patient Position (if appropriate) Lying  Pulse Rate (!) 59  Pulse Rate Source Dinamap  Resp 17  Oxygen Therapy  SpO2 99 %

## 2018-06-27 NOTE — H&P (Addendum)
[facility] Admission History and Physical Service Pager: [phone]  Patient name: Candice Warner Medical record number: [id number] Date of birth: [birth year] Age: 68 y.o. Gender: female  Primary Care Provider: [name], DO Consultants: None Code Status: Full  Chief Complaint: syncope  Assessment and Plan: Candice Warner is a 68 y.o. female presenting with syncopal event . PMH is significant for T2DM, Hypothyroidism, HTN, HLD, Depression, Chronic Back and Hip Pain.  Syncopal Event Patient reported syncopal event today at work following standing during which she fell backwards and hit her head.  Had small posterior head laceration, CT head and neck negative.  Did report SOB prior to event, so concern for PE. D-Dimer elevated to 15.34, but CTA negative for PE.  EKG NSR with no significant change since last EKG.  Patient did not report chest pain prior to syncopal event.  Troponin negative in ED, therefore doubt ACS, although patient does have history of obesity, DM, HTN, and HLD.  CXR negative for active cardiopulmonary disease.  No evidence of current infection as patient is afebrile with normal WBC.  On chart review, patient did have multiple outpatient PCP visits with HR in 72s and is 59 on admission, therefore bradycardia could be cause of syncopal event.  Hypotension 2/2 dehydration could also be cause as patient with AKI on presentation and patient's BP on presentation to ED was 97/80.  Orthostatic vitals in ED positive by SBP: 118 lying to 139 standing.  She also notes that she has multiple "dizzy" episodes per week that always occur upon standing, and this syncopal event occurred after standing, further supporting orthostatic hypotension as cause of syncope.  Patient also has a history of hypothyroidism with last TSH in 07/2017, therefore could also be contributing as it is unclear if this is still well-controlled.  Will monitor patient on telemetry for  concern of arrhythmia, although not seen on initial EKG.  Has history of diabetes, but CBG 148 on admission and patient states that she has not had hypoglycemic episodes, although PCP note from 03/15/2018 states patient has had episodes of hypoglycemia to low 50s.   CT head and neck was negative for intracranial pathology.  Reassured as patient is neurologically intact and at mental baseline on admission.  Anxiety is also on differential as patient reports occasional episodes of shortness of breath while sitting which self-resolve with deep breathing. - admit to observation, Dr. Nori Riis attending - vitals per floor protocol - consider cardiology consult for possible outpatient monitor  - cardiac monitoring - continuous pulse ox - repeat orthostatic vitals in AM - AM EKG - Echo  - hold BP meds in setting of positive orthostats and AKI - BMP, CBC in AM - CBGs AC/HS - tylenol prn pain - UDS - PT/OT eval - up with assistance and fall precautions - carb modified diet - heparin for DVT PPx  Head Laceration Patient fell at hit head during syncopal event.  Laceration repaired in ED.  Tetanus also updated in ED.  No bleeding on exam. - cont to monitor for signs of bleeding and infection - tylenol prn pain  AKI Cr. 1.93 on admission, baseline appears to be around 0.8.  Patient received 1L fluid bolus in ED.  Appears euvolemic on exam.  Does take lisinopril/HCTZ QD and takes mobic and naproxen prn, but states that she does not take them daily.  Does not take other NSAIDs per her report.  Suspect pre-renal 2/2 dehydration as patient also with positive  orthostats. - hold nephrotoxic agents - renally dosed gabapentin - f/u BMP in AM  Hypokalemia K 3.0 on admission.  Given K-Dur 75mq x1 in the ED. - repeat K-Dur 432m x1 - f/u AM BMP  Hypothyroidism Last TSH 2.00 in 07/2017.   Patient takes synthroid 5034mQD and states that she is compliant. - repeat TSH  T2DM with Neuropathy  On metformin and  gabapentin at home.  Last A1c 02/09/2018, 6.9.  CBG on admission 148.  Patient states that she has not had hypoglycemia recently. - A1c - renally dosed gabapentin - hold metformin while inpatient - CBGs AC/HS - sSSI  Normocytic Anemia Hgb 11.5 on admission, MCV 90.4.  Appears to be at baseline of 11-12. - cont to monitor CBC - anemia panel in AM  HTN On Lisinopril/HCTZ 20/36m61m at home.  BP on admission 134/62.  Patient with positive orthostatic vitals in ED by SBP from lying to standing.  S/P 1L fluid bolus in ED. - holding lisinopril/HCTZ in the setting of positive orthostats and AKI - repeat orthostatic vitals in AM - wonder whether ambulatory BP monitoring would be helpful in this patient with normotensive pressures on lisinopril-HCTZ at office and possible orthostatic hypotension at home  HLD On lipitor 40 QD at home. Last lipid panel 07/2017 with LDL 32. - cont home lipitor  Depression Takes zoloft at home. - cont home zoloft  Chronic Low Back Pain and Right Hip Pain Sees Sports Med.  Takes mobic and naproxen prn at home for pain, but states does not take daily. - hold these in the setting of AKI - tylenol, k-pad prn pain  FEN/GI: carb modified diet Prophylaxis: heparin  Disposition: admit to obs, syncope work-up  History of Present Illness:  Candice Warner 67 y63. female presenting following syncopal event.  She reports that she was in her usual state of health this AM until about 11:30AM when she states that she started to feel dizzy.  She went to her supervisor's office where she sat for a little while.  She states that the last thing she remembers is standing up.  Then patient passed out and hit her head.  She reports feeling SOB and dizzy prior to syncope.  She denies feeling chest pain or palpitations.  She also states that she noticed some cramping in her hands and felt as though she couldn't straigten them, but that has since resolved.  Patient states that  for the past few months, she has been experiencing dizziness upon standing a few times a week.  She also states that about 2x per month she has episodes of shortness of breath at rest which last about 5-6 minutes and then resolve with deep breathing.  Denies CP, palpitations, diaphoresis with this.  Does not have SOB or DOE otherwise.  No orthopnea or pedal edema.  Her daughter and niece are present for interview and state that the patient does not eat very much during the week because she is so busy with work.  Patient states, "I cook at work, so I'm just not hungry."  She also states that she eats a "normal amount" on the weekends which includes breakfast and dinner only, which is usual for her.  In ED, patient noted to have positive orthostatic vitals and AKI, therefore was given 1L fluid bolus.  Had posterior head laceration repaired with 1 stable.  She received EKG which was NSR.  Patient has remained at baseline and is presently without complaints, but  will be admitted due to AKI and for syncope work-up.   Review Of Systems: Per HPI with the following additions:   Review of Systems  Constitutional: Negative for chills, fever, malaise/fatigue and weight loss.  HENT: Negative for congestion and sore throat.   Eyes: Negative for blurred vision.  Respiratory: Positive for shortness of breath. Negative for cough and sputum production.   Cardiovascular: Negative for chest pain, palpitations, orthopnea and leg swelling.  Gastrointestinal: Positive for nausea and vomiting. Negative for abdominal pain.  Musculoskeletal: Positive for back pain and joint pain.  Skin: Negative for rash.  Neurological: Negative for focal weakness and headaches.    Patient Active Problem List   Diagnosis Date Noted  . Hypoglycemia 03/18/2018  . Bacterial conjunctivitis of left eye 07/02/2017  . Constipation 04/22/2017  . Night sweats 06/30/2016  . Left foot pain 04/06/2016  . Nodule of chest wall 06/21/2015  .  Trigger finger, acquired 10/23/2014  . Insomnia 08/27/2014  . Healthcare maintenance 02/01/2013  . Right hip pain 06/01/2011  . Back pain 09/26/2009  . HLD (hyperlipidemia) 04/16/2009  . Diabetes mellitus without complication ([place]) 44/08/4740  . DEGENERATIVE JOINT DISEASE, HIPS 10/29/2008  . Hypothyroidism 08/12/2006  . OBESITY, NOS 08/12/2006  . Major depressive disorder, recurrent episode ([place]) 08/12/2006  . HYPERTENSION, BENIGN SYSTEMIC 08/12/2006  . DIVERTICULOSIS OF COLON 08/12/2006    Past Medical History: Past Medical History:  Diagnosis Date  . Depression   . Diabetes mellitus   . Hyperlipidemia   . Hypertension   . Hypokalemia   . Hypothyroidism   . Lumbar back pain   . Obesity   . Radiculopathy   . Thyroid disease     Past Surgical History: Past Surgical History:  Procedure Laterality Date  . CARDIAC CATHETERIZATION  2010   No CAD  . CYST REMOVAL HAND Right 11/07/2015   Procedure: EXCISION OF VOLAR RETANICULUM RIGHT HAND;  Surgeon: Ninetta Lights, MD;  Location: [facility];  Service: Orthopedics;  Laterality: Right;  . SHOULDER ARTHROSCOPY  2002   left  . SHOULDER ARTHROSCOPY WITH ROTATOR CUFF REPAIR AND SUBACROMIAL DECOMPRESSION  07/14/2012   Procedure: SHOULDER ARTHROSCOPY WITH ROTATOR CUFF REPAIR AND SUBACROMIAL DECOMPRESSION;  Surgeon: Ninetta Lights, MD;  Location: [facility];  Service: Orthopedics;  Laterality: Right;  Right Shoulder Arthroscopy, Distal Claviculectomy, Subacromial Decompression, Partial Acromioplasty with Coracoacromial Release with Arthroscopic Rotator Cuff Repair   . THYROID LOBECTOMY     Goiter s/p right thyroidectomy  . TRIGGER FINGER RELEASE Right 11/07/2015   Procedure: RELEASE TRIGGER FINGER/A-1 PULLEY RIGHT RING FINGER ;  Surgeon: Ninetta Lights, MD;  Location: [facility];  Service: Orthopedics;  Laterality: Right;  . TUBAL LIGATION      Social History: Social History   Tobacco Use   . Smoking status: Never Smoker  . Smokeless tobacco: Never Used  Substance Use Topics  . Alcohol use: No  . Drug use: No   Additional social history: none  Please also refer to relevant sections of EMR.  Family History: Family History  Problem Relation Age of Onset  . Heart disease Mother   . Heart disease Father   . Breast cancer Maternal Aunt     Allergies and Medications: No Known Allergies No current facility-administered medications on file prior to encounter.    Current Outpatient Medications on File Prior to Encounter  Medication Sig Dispense Refill  . aspirin (ASPIR-81) 81 MG EC tablet Take 1 tablet (81 mg  total) by mouth daily. 30 tablet 11  . atorvastatin (LIPITOR) 40 MG tablet Take 1 tablet (40 mg total) by mouth daily. 90 tablet 3  . Blood Glucose Monitoring Suppl W/DEVICE KIT Test blood sugars every morning prior to eating or drinking. 1 each 0  . erythromycin ophthalmic ointment Place 1 application into both eyes 3 (three) times daily. 3.5 g 0  . gabapentin (NEURONTIN) 100 MG capsule Take 1 capsule (100 mg total) by mouth 3 (three) times daily. 90 capsule 3  . glucose blood (ONE TOUCH ULTRA TEST) test strip Check sugar 2 x daily 100 each 3  . KLOR-CON M20 20 MEQ tablet TAKE 1 TABLET BY MOUTH TWICE A DAY 120 tablet 1  . Lancets (ONETOUCH ULTRASOFT) lancets Use as instructed 100 each 6  . levothyroxine (SYNTHROID, LEVOTHROID) 50 MCG tablet Take 1 tablet (50 mcg total) by mouth daily. 90 tablet 1  . lisinopril-hydrochlorothiazide (PRINZIDE,ZESTORETIC) 20-25 MG tablet Take 1 tablet by mouth daily. 90 tablet 1  . loratadine (CLARITIN) 10 MG tablet Take 1 tablet (10 mg total) by mouth daily. 30 tablet 11  . meloxicam (MOBIC) 15 MG tablet Take 1 tablet (15 mg total) by mouth daily. 10 tablet 0  . meloxicam (MOBIC) 15 MG tablet TAKE 1 TABLET BY MOUTH EVERY DAY 5 tablet 0  . metFORMIN (GLUCOPHAGE) 500 MG tablet Take 2 tablets (1,000 mg total) by mouth 2 (two) times daily  with a meal. 180 tablet 3  . naproxen (NAPROSYN) 500 MG tablet Take 1 tablet (500 mg total) by mouth 2 (two) times daily as needed. 60 tablet 0  . sertraline (ZOLOFT) 100 MG tablet Take 1 tablet (100 mg total) by mouth daily. 90 tablet 0    Objective: BP (!) 118/57 (BP Location: Left Arm)   Pulse (!) 58   Temp 97.6 F (36.4 C) (Oral)   Resp 14   Ht 5' 6.5" (1.689 m)   Wt 89.8 kg   SpO2 100%   BMI 31.48 kg/m   Physical Exam: General: 68 y.o. female in NAD HEENT: 0.5cm laceration left occiput with one staple, not bleeding, PEERL, EOMI, MMM Neck: Supple, full ROM Cardio: RRR no m/r/g, no carotid bruits, 2+ pulses dorsalis pedis Lungs: CTAB, no wheezing, no rhonchi, no crackles, no increased work of breathing on RA Abdomen: Soft, non-tender to palpation, positive bowel sounds Skin: warm and dry Extremities: No edema, 5/5 strength BUE/BLE Neuro: CN II-XII grossly intact, sensation intact throughout Psych: awake, alert, mood and affect appropriate for circumstance   Labs and Imaging: CBC BMET  Recent Labs  Lab 06/27/18 1431  WBC 7.0  HGB 11.5*  HCT 35.8*  PLT 325   Recent Labs  Lab 06/27/18 1431  NA 140  K 3.0*  CL 103  CO2 22  BUN 22  CREATININE 1.93*  GLUCOSE 148*  CALCIUM 9.5     Dg Chest 2 View  Result Date: 06/27/2018 CLINICAL DATA:  Syncope. EXAM: CHEST - 2 VIEW COMPARISON:  02/22/2009 FINDINGS: The heart size and mediastinal contours are within normal limits. Both lungs are clear. No acute bone abnormality. Degenerative changes at the shoulders. Previous resection of the distal right clavicle. IMPRESSION: No active cardiopulmonary disease. Electronically Signed   By: Lorriane Shire M.D.   On: 06/27/2018 16:44   Ct Head Wo Contrast  Result Date: 06/27/2018 CLINICAL DATA:  Recent fall following syncopal episode EXAM: CT HEAD WITHOUT CONTRAST CT CERVICAL SPINE WITHOUT CONTRAST TECHNIQUE: Multidetector CT imaging of the head and  cervical spine was performed  following the standard protocol without intravenous contrast. Multiplanar CT image reconstructions of the cervical spine were also generated. COMPARISON:  None. FINDINGS: CT HEAD FINDINGS Brain: No evidence of acute infarction, hemorrhage, hydrocephalus, extra-axial collection or mass lesion/mass effect. Vascular: No hyperdense vessel or unexpected calcification. Skull: Normal. Negative for fracture or focal lesion. Sinuses/Orbits: No acute finding. Other: None CT CERVICAL SPINE FINDINGS Alignment: Within normal limits. Skull base and vertebrae: 7 cervical segments are well visualized. Vertebral body height is well maintained. Osteophytic changes are noted at multiple levels. The odontoid is within normal limits. No findings to suggest acute fracture or acute facet abnormality are noted. Soft tissues and spinal canal: Changes of prior right partial thyroidectomy are noted. No other soft tissue abnormality is seen Upper chest: Within normal limits. Other: None IMPRESSION: CT of the head: Normal head CT. CT of the cervical spine: Mild degenerative change without acute abnormality. Electronically Signed   By: Inez Catalina M.D.   On: 06/27/2018 16:20   Ct Angio Chest Pe W And/or Wo Contrast  Result Date: 06/27/2018 CLINICAL DATA:  Concern for pulmonary embolism. Short of breath. Nausea and vomiting. EXAM: CT ANGIOGRAPHY CHEST WITH CONTRAST TECHNIQUE: Multidetector CT imaging of the chest was performed using the standard protocol during bolus administration of intravenous contrast. Multiplanar CT image reconstructions and MIPs were obtained to evaluate the vascular anatomy. CONTRAST:  <See Chart> ISOVUE-370 IOPAMIDOL (ISOVUE-370) INJECTION 76% COMPARISON:  Radiograph 06/27/2018, CT 312 1,010 FINDINGS: Cardiovascular: No filling defects within the pulmonary arteries to suggest acute pulmonary embolism. No acute findings of the aorta or great vessels. No pericardial fluid. No significant vascular findings. Normal heart  size. No pericardial effusion. Mediastinum/Nodes: No axillary or supraclavicular adenopathy. No mediastinal hilar adenopathy. No pericardial effusion. Esophagus normal. Lungs/Pleura: No suspicious pulmonary nodules. No pulmonary infarction. No airspace disease. No pleural fluid. No pulmonary edema. Upper Abdomen: Limited view of the liver, kidneys, pancreas are unremarkable. Normal adrenal glands. Musculoskeletal: No acute osseous abnormality. Degenerative osteophytosis of the spine. Review of the MIP images confirms the above findings. IMPRESSION: 1. No acute pulmonary embolism. 2. No pulmonary infarction or infection.  No pulmonary edema. 3. Degenerate spurring of spine. Electronically Signed   By: Suzy Bouchard M.D.   On: 06/27/2018 18:53   Ct Cervical Spine Wo Contrast  Result Date: 06/27/2018 CLINICAL DATA:  Recent fall following syncopal episode EXAM: CT HEAD WITHOUT CONTRAST CT CERVICAL SPINE WITHOUT CONTRAST TECHNIQUE: Multidetector CT imaging of the head and cervical spine was performed following the standard protocol without intravenous contrast. Multiplanar CT image reconstructions of the cervical spine were also generated. COMPARISON:  None. FINDINGS: CT HEAD FINDINGS Brain: No evidence of acute infarction, hemorrhage, hydrocephalus, extra-axial collection or mass lesion/mass effect. Vascular: No hyperdense vessel or unexpected calcification. Skull: Normal. Negative for fracture or focal lesion. Sinuses/Orbits: No acute finding. Other: None CT CERVICAL SPINE FINDINGS Alignment: Within normal limits. Skull base and vertebrae: 7 cervical segments are well visualized. Vertebral body height is well maintained. Osteophytic changes are noted at multiple levels. The odontoid is within normal limits. No findings to suggest acute fracture or acute facet abnormality are noted. Soft tissues and spinal canal: Changes of prior right partial thyroidectomy are noted. No other soft tissue abnormality is seen  Upper chest: Within normal limits. Other: None IMPRESSION: CT of the head: Normal head CT. CT of the cervical spine: Mild degenerative change without acute abnormality. Electronically Signed   By: Linus Mako.D.  On: 06/27/2018 16:20    Meccariello, Bernita Raisin, DO 06/27/2018, 7:56 PM PGY-1, [place] Intern pager: [phone], text pages welcome  FPTS Upper-Level Resident Addendum   I have independently interviewed and examined the patient. I have discussed the above with the original author and agree with their documentation. My edits for correction/addition/clarification are in blue. Please see also any attending notes.    Ralene Ok, MD PGY-3, [place] Service pager: [phone] (text pages welcome through [facility])

## 2018-06-27 NOTE — ED Provider Notes (Signed)
[facility]ERGENC[facility] DEPARTMENT Provider Note   CSN: [id number] Arrival date & time: 06/27/18  1359     History   Chief Complaint Chief Complaint  Patient presents with  . Loss of Consciousness  . Laceration  . Fall    HPI SOBIA Candice Warner is a 68 y.o. female.  Candice Warner is a 68 y.o. female with a history of hypertension, hyperlipidemia, hypothyroidism, diabetes, low back pain and depression, who presents to the emergency department EMS for evaluation after syncopal episode.  Patient reports she started feeling unwell at work today around 11:15 began nauseated and had a few episodes of vomiting and then felt lightheaded as though she might pass out.  Patient was then found supine on the floor by a coworker with a small laceration to the left occipital region with surrounding hematoma. Unknown amount of downtime. Bleeding from laceration controlled.  Unsure of last tetanus vaccination. With EMS initial systolic BP in the 68T but has improved without intervention with EMS.  Patient denies preceding chest pain but does report feeling like she needed to breathe heavily in order to feel like she was getting enough air.  Denies any abdominal pain associated with nausea and vomiting.  No lower extremity swelling or pain.  No recent long distance travel or surgery, no prior history of PE or DVT.  Patient denies history of previous cardiac events or arrhythmia.  No prior history of syncope.  Reports pain over the back of her head from fall but denies pain elsewhere, no pain in the neck or back.  No history of prior seizures.     Past Medical History:  Diagnosis Date  . Depression   . Diabetes mellitus   . Hyperlipidemia   . Hypertension   . Hypokalemia   . Hypothyroidism   . Lumbar back pain   . Obesity   . Radiculopathy   . Thyroid disease     Patient Active Problem List   Diagnosis Date Noted  . Hypoglycemia 03/18/2018  . Bacterial conjunctivitis of left  eye 07/02/2017  . Constipation 04/22/2017  . Night sweats 06/30/2016  . Left foot pain 04/06/2016  . Nodule of chest wall 06/21/2015  . Trigger finger, acquired 10/23/2014  . Insomnia 08/27/2014  . Healthcare maintenance 02/01/2013  . Right hip pain 06/01/2011  . Back pain 09/26/2009  . HLD (hyperlipidemia) 04/16/2009  . Diabetes mellitus without complication ([place]) 15/72/6203  . DEGENERATIVE JOINT DISEASE, HIPS 10/29/2008  . Hypothyroidism 08/12/2006  . OBESITY, NOS 08/12/2006  . Major depressive disorder, recurrent episode ([place]) 08/12/2006  . HYPERTENSION, BENIGN SYSTEMIC 08/12/2006  . DIVERTICULOSIS OF COLON 08/12/2006    Past Surgical History:  Procedure Laterality Date  . CARDIAC CATHETERIZATION  2010   No CAD  . CYST REMOVAL HAND Right 11/07/2015   Procedure: EXCISION OF VOLAR RETANICULUM RIGHT HAND;  Surgeon: Ninetta Lights, MD;  Location: [place];  Service: Orthopedics;  Laterality: Right;  . SHOULDER ARTHROSCOPY  2002   left  . SHOULDER ARTHROSCOPY WITH ROTATOR CUFF REPAIR AND SUBACROMIAL DECOMPRESSION  07/14/2012   Procedure: SHOULDER ARTHROSCOPY WITH ROTATOR CUFF REPAIR AND SUBACROMIAL DECOMPRESSION;  Surgeon: Ninetta Lights, MD;  Location: [facility];  Service: Orthopedics;  Laterality: Right;  Right Shoulder Arthroscopy, Distal Claviculectomy, Subacromial Decompression, Partial Acromioplasty with Coracoacromial Release with Arthroscopic Rotator Cuff Repair   . THYROID LOBECTOMY     Goiter s/p right thyroidectomy  . TRIGGER FINGER RELEASE Right 11/07/2015  Procedure: RELEASE TRIGGER FINGER/A-1 PULLEY RIGHT RING FINGER ;  Surgeon: Ninetta Lights, MD;  Location: [facility];  Service: Orthopedics;  Laterality: Right;  . TUBAL LIGATION       OB History   No obstetric history on file.      Home Medications    Prior to Admission medications   Medication Sig Start Date End Date Taking? Authorizing Provider  aspirin  (ASPIR-81) 81 MG EC tablet Take 1 tablet (81 mg total) by mouth daily. 08/03/17   Mikell, Jeani Sow, MD  atorvastatin (LIPITOR) 40 MG tablet Take 1 tablet (40 mg total) by mouth daily. 07/27/17   Mikell, Jeani Sow, MD  Blood Glucose Monitoring Suppl W/DEVICE KIT Test blood sugars every morning prior to eating or drinking. 09/19/13   Kuneff, Renee A, DO  erythromycin ophthalmic ointment Place 1 application into both eyes 3 (three) times daily. 07/01/17   Mikell, Jeani Sow, MD  gabapentin (NEURONTIN) 100 MG capsule Take 1 capsule (100 mg total) by mouth 3 (three) times daily. 08/03/17   Mikell, Jeani Sow, MD  glucose blood (ONE TOUCH ULTRA TEST) test strip Check sugar 2 x daily 10/03/13   Kuneff, Renee A, DO  KLOR-CON M20 20 MEQ tablet TAKE 1 TABLET BY MOUTH TWICE A DAY 07/24/14   Kuneff, Renee A, DO  Lancets (ONETOUCH ULTRASOFT) lancets Use as instructed 10/03/13   Kuneff, Renee A, DO  levothyroxine (SYNTHROID, LEVOTHROID) 50 MCG tablet Take 1 tablet (50 mcg total) by mouth daily. 03/17/18   [name], DO  lisinopril-hydrochlorothiazide (PRINZIDE,ZESTORETIC) 20-25 MG tablet Take 1 tablet by mouth daily. 02/02/18   [name], DO  loratadine (CLARITIN) 10 MG tablet Take 1 tablet (10 mg total) by mouth daily. 07/01/17   Mikell, Jeani Sow, MD  meloxicam (MOBIC) 15 MG tablet Take 1 tablet (15 mg total) by mouth daily. 02/09/18   [name], DO  meloxicam (MOBIC) 15 MG tablet TAKE 1 TABLET BY MOUTH EVERY DAY 03/07/18   [name], DO  metFORMIN (GLUCOPHAGE) 500 MG tablet Take 2 tablets (1,000 mg total) by mouth 2 (two) times daily with a meal. 08/03/17   Mikell, Jeani Sow, MD  naproxen (NAPROSYN) 500 MG tablet Take 1 tablet (500 mg total) by mouth 2 (two) times daily as needed. 06/16/16   Thurman Coyer, DO  sertraline (ZOLOFT) 100 MG tablet Take 1 tablet (100 mg total) by mouth daily. 02/03/18   [name], DO    Family History Family History  Problem Relation Age of  Onset  . Heart disease Mother   . Heart disease Father   . Breast cancer Maternal Aunt     Social History Social History   Tobacco Use  . Smoking status: Never Smoker  . Smokeless tobacco: Never Used  Substance Use Topics  . Alcohol use: No  . Drug use: No     Allergies   Patient has no known allergies.   Review of Systems Review of Systems  Constitutional: Negative for chills and fever.  HENT: Negative.   Eyes: Negative for visual disturbance.  Respiratory: Positive for shortness of breath. Negative for chest tightness and wheezing.   Cardiovascular: Negative for chest pain, palpitations and leg swelling.  Gastrointestinal: Positive for nausea and vomiting. Negative for abdominal pain, blood in stool, constipation and diarrhea.  Genitourinary: Negative for dysuria and frequency.  Musculoskeletal: Negative for arthralgias, back pain, myalgias and neck pain.  Skin: Positive for wound. Negative for color change and rash.  Neurological: Positive for syncope, light-headedness and headaches. Negative for dizziness, tremors, seizures, speech difficulty, weakness and numbness.  All other systems reviewed and are negative.    Physical Exam Updated Vital Signs BP 124/67 (BP Location: Left Arm)   Pulse 60   Temp 97.6 F (36.4 C) (Oral)   Resp 20   Ht 5' 6.5" (1.689 m)   Wt 89.8 kg   SpO2 100%   BMI 31.48 kg/m   Physical Exam Vitals signs and nursing note reviewed.  Constitutional:      General: She is not in acute distress.    Appearance: Normal appearance. She is well-developed. She is obese. She is not ill-appearing, toxic-appearing or diaphoretic.  HENT:     Head: Normocephalic.     Comments: Hematoma over the left occiput with 1 cm laceration with small amount of bleeding, no palpable associated step-off, negative battle sign, no raccoon eyes.    Mouth/Throat:     Mouth: Mucous membranes are moist.     Pharynx: Oropharynx is clear.  Eyes:     General:         Right eye: No discharge.        Left eye: No discharge.     Extraocular Movements: Extraocular movements intact.     Conjunctiva/sclera: Conjunctivae normal.     Pupils: Pupils are equal, round, and reactive to light.  Neck:     Musculoskeletal: Neck supple.     Comments: C-spine nontender to palpation Cardiovascular:     Rate and Rhythm: Normal rate and regular rhythm.     Pulses: Normal pulses.     Heart sounds: Normal heart sounds. No murmur. No friction rub. No gallop.   Pulmonary:     Effort: Pulmonary effort is normal. No respiratory distress.     Breath sounds: Normal breath sounds. No wheezing or rales.     Comments: Respirations equal and unlabored, patient able to speak in full sentences, lungs clear to auscultation bilaterally Abdominal:     General: Abdomen is flat. Bowel sounds are normal. There is no distension.     Palpations: Abdomen is soft. There is no mass.     Tenderness: There is no abdominal tenderness. There is no guarding.     Comments: Abdomen soft, nondistended, nontender to palpation in all quadrants without guarding or peritoneal signs  Musculoskeletal:        General: No deformity.     Right lower leg: No edema.     Left lower leg: No edema.  Skin:    General: Skin is warm and dry.     Capillary Refill: Capillary refill takes less than 2 seconds.  Neurological:     Mental Status: She is alert and oriented to person, place, and time. Mental status is at baseline.     Coordination: Coordination normal.     Comments: Speech is clear, able to follow commands CN III-XII intact Normal strength in upper and lower extremities bilaterally including dorsiflexion and plantar flexion, strong and equal grip strength Sensation normal to light and sharp touch Moves extremities without ataxia, coordination intact Normal finger to nose and rapid alternating movements No pronator drift  Psychiatric:        Mood and Affect: Mood normal.        Behavior: Behavior  normal.      ED Treatments / Results  Labs (all labs ordered are listed, but only abnormal results are displayed) Labs Reviewed  COMPREHENSIVE METABOLIC PANEL - Abnormal; Notable for the  following components:      Result Value   Potassium 3.0 (*)    Glucose, Bld 148 (*)    Creatinine, Ser 1.93 (*)    Total Protein 8.3 (*)    GFR calc non Af Amer 26 (*)    GFR calc Af Amer 30 (*)    All other components within normal limits  CBC WITH DIFFERENTIAL/PLATELET - Abnormal; Notable for the following components:   Hemoglobin 11.5 (*)    HCT 35.8 (*)    All other components within normal limits  URINALYSIS, ROUTINE W REFLEX MICROSCOPIC  D-DIMER, QUANTITATIVE (NOT AT [facility])  I-STAT TROPONIN, ED    EKG EKG Interpretation  Date/Time:  Monday June 27 2018 14:07:00 EST Ventricular Rate:  62 PR Interval:    QRS Duration: 95 QT Interval:  438 QTC Calculation: 445 R Axis:   74 Text Interpretation:  Sinus rhythm Minimal ST elevation, inferior leads No significant change since last tracing Confirmed by Dorie Rank [phone]) on 06/27/2018 2:33:15 PM   Radiology No results found.  Procedures .Marland KitchenLaceration Repair Date/Time: 06/27/2018 4:26 PM Performed by: Jacqlyn Larsen, PA-C Authorized by: Jacqlyn Larsen, PA-C   Consent:    Consent obtained:  Verbal   Consent given by:  Patient   Risks discussed:  Infection, pain, need for additional repair, poor cosmetic result and poor wound healing   Alternatives discussed:  No treatment Anesthesia (see MAR for exact dosages):    Anesthesia method:  Local infiltration   Local anesthetic:  Lidocaine 2% WITH epi Laceration details:    Location:  Scalp   Scalp location:  Occipital   Length (cm):  1 Repair type:    Repair type:  Simple Pre-procedure details:    Preparation:  Patient was prepped and draped in usual sterile fashion Exploration:    Hemostasis achieved with:  Direct pressure   Wound exploration: entire depth of wound probed and  visualized     Wound extent: areolar tissue violated   Treatment:    Area cleansed with:  Saline   Amount of cleaning:  Standard Skin repair:    Repair method:  Staples   Number of staples:  1 Approximation:    Approximation:  Close Post-procedure details:    Patient tolerance of procedure:  Tolerated well, no immediate complications   (including critical care time)  Medications Ordered in ED Medications  lidocaine-EPINEPHrine (XYLOCAINE W/EPI) 2 %-1:200000 (PF) injection 10 mL (has no administration in time range)  Tdap (BOOSTRIX) injection 0.5 mL (has no administration in time range)  potassium chloride SA (K-DUR,KLOR-CON) CR tablet 40 mEq (has no administration in time range)  sodium chloride 0.9 % bolus 1,000 mL (1,000 mLs Intravenous New Bag/Given 06/27/18 1515)     Initial Impression / Assessment and Plan / ED Course  I have reviewed the triage vital signs and the nursing notes.  Pertinent labs & imaging results that were available during my care of the patient were reviewed by me and considered in my medical decision making (see chart for details).  Patient presents via EMS for evaluation after syncopal episode with associated head injury she has a hematoma to the left occiput with a 1 cm laceration, bleeding controlled.  Tetanus updated and laceration closed with single staple.  No prior history of syncopal episodes.  Does report some shortness of breath but no chest pain.  Began feeling lightheaded and nauseated and had a few episodes of vomiting prior to syncope and was found in a supine  position by a coworker.  On arrival vitals are normal and patient appears to be in no acute distress.  Syncope is concerning for possible cardiac etiology, and given sensation of increased work of breathing PE is also on differential.  No arrhythmia noted with EMS or on arrival.  Patient does not have any history of seizures, no seizure-like activity witnessed by coworker.  Lower suspicion for  neurologic etiology, no focal neurologic deficits on exam.  Will get EKG, troponin, basic labs, urinalysis and d-dimer as well as chest x-ray and CT of the head and neck given trauma from fall.  Will check orthostatics and give 1 L fluid bolus.  Labs show no leukocytosis, hemoglobin at baseline, patient does have hypokalemia at 3.0, will give oral potassium replacement, glucose slightly elevated at 148, no other acute electrolyte derangements.  Creatinine is significantly elevated from baseline at 1.93, baseline is usually around 1.0.  Normal liver function.  EKG with normal sinus rhythm and troponin negative.  Given elevated creatinine patient will require admission.  Care signed out to [facility] at shift change.  Patient will require admission for syncope with AKI pending the rest of her work-up.  At shift change pending urinalysis and d-dimer as well as CTs of the head, C-spine and chest x-ray.  Final Clinical Impressions(s) / ED Diagnoses   Final diagnoses:  Syncope and collapse  Injury of head, initial encounter  Laceration of scalp, initial encounter    ED Discharge Orders    None       Janet Berlin 06/27/18 1635    Dorie Rank, MD 06/29/18 [phone]

## 2018-06-28 ENCOUNTER — Other Ambulatory Visit: Payer: Self-pay | Admitting: Cardiology

## 2018-06-28 ENCOUNTER — Observation Stay (HOSPITAL_BASED_OUTPATIENT_CLINIC_OR_DEPARTMENT_OTHER): Payer: BLUE CROSS/BLUE SHIELD

## 2018-06-28 DIAGNOSIS — R55 Syncope and collapse: Secondary | ICD-10-CM | POA: Diagnosis not present

## 2018-06-28 DIAGNOSIS — I10 Essential (primary) hypertension: Secondary | ICD-10-CM | POA: Diagnosis not present

## 2018-06-28 DIAGNOSIS — E782 Mixed hyperlipidemia: Secondary | ICD-10-CM | POA: Diagnosis not present

## 2018-06-28 DIAGNOSIS — S0101XA Laceration without foreign body of scalp, initial encounter: Secondary | ICD-10-CM

## 2018-06-28 DIAGNOSIS — R9431 Abnormal electrocardiogram [ECG] [EKG]: Secondary | ICD-10-CM | POA: Diagnosis not present

## 2018-06-28 DIAGNOSIS — R001 Bradycardia, unspecified: Secondary | ICD-10-CM | POA: Diagnosis not present

## 2018-06-28 DIAGNOSIS — E860 Dehydration: Secondary | ICD-10-CM | POA: Diagnosis not present

## 2018-06-28 DIAGNOSIS — N179 Acute kidney failure, unspecified: Secondary | ICD-10-CM

## 2018-06-28 LAB — BASIC METABOLIC PANEL
Anion gap: 10 (ref 5–15)
BUN: 22 mg/dL (ref 8–23)
CHLORIDE: 102 mmol/L (ref 98–111)
CO2: 24 mmol/L (ref 22–32)
Calcium: 8.3 mg/dL — ABNORMAL LOW (ref 8.9–10.3)
Creatinine, Ser: 1.47 mg/dL — ABNORMAL HIGH (ref 0.44–1.00)
GFR calc Af Amer: 42 mL/min — ABNORMAL LOW (ref >60)
GFR calc non Af Amer: 36 mL/min — ABNORMAL LOW (ref >60)
Glucose, Bld: 128 mg/dL — ABNORMAL HIGH (ref 70–99)
Potassium: 3.2 mmol/L — ABNORMAL LOW (ref 3.5–5.1)
Sodium: 136 mmol/L (ref 135–145)

## 2018-06-28 LAB — RETICULOCYTES
Immature Retic Fract: 9.7 % (ref 2.3–15.9)
RBC.: 3.55 MIL/uL — ABNORMAL LOW (ref 3.87–5.11)
Retic Count, Absolute: 38.3 10*3/uL (ref 19.0–186.0)
Retic Ct Pct: 1.1 % (ref 0.4–3.1)

## 2018-06-28 LAB — CBC
HCT: 31.7 % — ABNORMAL LOW (ref 36.0–46.0)
HEMOGLOBIN: 10.4 g/dL — AB (ref 12.0–15.0)
MCH: 29.3 pg (ref 26.0–34.0)
MCHC: 32.8 g/dL (ref 30.0–36.0)
MCV: 89.3 fL (ref 80.0–100.0)
Platelets: 273 10*3/uL (ref 150–400)
RBC: 3.55 MIL/uL — ABNORMAL LOW (ref 3.87–5.11)
RDW: 13.2 % (ref 11.5–15.5)
WBC: 7.7 10*3/uL (ref 4.0–10.5)
nRBC: 0 % (ref 0.0–0.2)

## 2018-06-28 LAB — TSH: TSH: 1.894 u[IU]/mL (ref 0.350–4.500)

## 2018-06-28 LAB — GLUCOSE, CAPILLARY
Glucose-Capillary: 147 mg/dL — ABNORMAL HIGH (ref 70–99)
Glucose-Capillary: 163 mg/dL — ABNORMAL HIGH (ref 70–99)
Glucose-Capillary: 160 mg/dL — ABNORMAL HIGH (ref 70–99)

## 2018-06-28 LAB — FERRITIN: Ferritin: 117 ng/mL (ref 11–307)

## 2018-06-28 LAB — VITAMIN B12: Vitamin B-12: 215 pg/mL (ref 180–914)

## 2018-06-28 LAB — ECHOCARDIOGRAM COMPLETE
Height: 66.5 in
Weight: 3005.31 oz

## 2018-06-28 LAB — HEMOGLOBIN A1C
Hgb A1c MFr Bld: 7.6 % — ABNORMAL HIGH (ref 4.8–5.6)
Mean Plasma Glucose: 171.42 mg/dL

## 2018-06-28 LAB — RAPID URINE DRUG SCREEN, HOSP PERFORMED
Amphetamines: NOT DETECTED
Barbiturates: NOT DETECTED
Benzodiazepines: NOT DETECTED
Cocaine: NOT DETECTED
Opiates: NOT DETECTED
Tetrahydrocannabinol: NOT DETECTED

## 2018-06-28 LAB — IRON AND TIBC
Iron: 32 ug/dL (ref 28–170)
Saturation Ratios: 11 % (ref 10.4–31.8)
TIBC: 300 ug/dL (ref 250–450)
UIBC: 268 ug/dL

## 2018-06-28 LAB — FOLATE: Folate: 9.3 ng/mL (ref >5.9)

## 2018-06-28 LAB — HIV ANTIBODY (ROUTINE TESTING W REFLEX): HIV SCREEN 4TH GENERATION: NONREACTIVE

## 2018-06-28 MED ORDER — POTASSIUM CHLORIDE CRYS ER 20 MEQ PO TBCR
40.0000 meq | EXTENDED_RELEASE_TABLET | Freq: Once | ORAL | Status: AC
  Administered 2018-06-28: 40 meq via ORAL
  Filled 2018-06-28: qty 2

## 2018-06-28 NOTE — Discharge Summary (Signed)
[facility] Discharge Summary  Patient name: Candice Warner Medical record number: [id number] Date of birth: 02/24/1951 Age: 68 y.o. Gender: female Date of Admission: 06/27/2018  Date of Discharge: 06/29/2017 Admitting Physician: Dickie La, MD  Primary Care Provider: [name], DO Consultants: cardiology  Indication for Hospitalization: syncope  Discharge Diagnoses/Problem List:  Syncope Dehydration AKI Hypokalemia Hypothyroidism T2DM Neuropathy Normocytic anemia HTN Hyperlipidemia Depression Chronic back pain  Disposition: discharge home  Discharge Condition: improved, stable  Discharge Exam:   General: NAD, pleasant, able to participate in exam Cardiac: RRR, normal heart sounds, no murmurs. Respiratory: CTAB, normal effort, No wheezes, rales or rhonchi Abdomen: soft, nontender, nondistended, no hepatic or splenomegaly Extremities: no edema or cyanosis. Skin: warm and dry, no rashes noted. ~1cm laceration on posterior scalp with staple intact. No drainage/bleeding, non erythematous Neuro: alert and oriented x4, no focal deficits Psych: Normal affect and mood  Brief Hospital Course:  Presented with syncopal episode and fall to floor 2/2 dehydration/vasovagal reaction resulting in ~1cm laceration on posterior scalp and brisk recovery of consciousness. Patient remembers the event. Laceration was repaired with single staple in ED. She also received Iv fluids. Head/neck CT negative for acute bleed/injury. Positive orthostatics on admission. CTangio of chest was negative for PE. Echo essentially normal with eEF 60-65%. Cardiology was consulted who attributed episode to dehydration/vaso-vagal response and no further workup at this time. Discontinued HCTZ 2/2 contribution to dehydration. Lisinopril was held in setting of AKI and patient remained low to normotensive off medications. On day of discharge, BP was 98/58. Patient remained asymptomatic  while admitted and had stable vital signs throughout stay. Prior to discharge, patient was able to ambulate hallways and PT/OT signed off due to patient being at baseline function. Her head pain at site of laceration had resolved and staple was still in place.   AKI- Creatinine improved from 1.93 on admission to 1.04 on day of discharge. Patient received IV hydration, discontinued BP medications, and received counseling on the importance of maintaining PO intake throughout her work day.   Issues for Follow Up:  1. F/u orthostatic vital signs 2. F/u BP since patient was previously on HCTZ/lisinopril but were not continued on d/c. Could consider restarting lisinopril if Cr recovers and BP is elevated given that patient is diabetic  3. Single staple removal from posterior scalp  Significant Procedures: echo  Significant Labs and Imaging:  Recent Labs  Lab 06/27/18 1431 06/28/18 0022  WBC 7.0 7.7  HGB 11.5* 10.4*  HCT 35.8* 31.7*  PLT 325 273   Recent Labs  Lab 06/27/18 1431 06/28/18 0022  NA 140 136  K 3.0* 3.2*  CL 103 102  CO2 22 24  GLUCOSE 148* 128*  BUN 22 22  CREATININE 1.93* 1.47*  CALCIUM 9.5 8.3*  ALKPHOS 68  --   AST 25  --   ALT 17  --   ALBUMIN 4.1  --    Anemia panel wnl hgb A1c 7.6 TSH 1.894 HIV negative  Dg Chest 2 View  Result Date: 06/27/2018 CLINICAL DATA:  Syncope. EXAM: CHEST - 2 VIEW COMPARISON:  02/22/2009 FINDINGS: The heart size and mediastinal contours are within normal limits. Both lungs are clear. No acute bone abnormality. Degenerative changes at the shoulders. Previous resection of the distal right clavicle. IMPRESSION: No active cardiopulmonary disease. Electronically Signed   By: Lorriane Shire M.D.   On: 06/27/2018 16:44   Ct Head Wo Contrast  Result Date: 06/27/2018 CLINICAL DATA:  Recent fall following syncopal episode EXAM: CT HEAD WITHOUT CONTRAST CT CERVICAL SPINE WITHOUT CONTRAST TECHNIQUE: Multidetector CT imaging of the head and  cervical spine was performed following the standard protocol without intravenous contrast. Multiplanar CT image reconstructions of the cervical spine were also generated. COMPARISON:  None. FINDINGS: CT HEAD FINDINGS Brain: No evidence of acute infarction, hemorrhage, hydrocephalus, extra-axial collection or mass lesion/mass effect. Vascular: No hyperdense vessel or unexpected calcification. Skull: Normal. Negative for fracture or focal lesion. Sinuses/Orbits: No acute finding. Other: None CT CERVICAL SPINE FINDINGS Alignment: Within normal limits. Skull base and vertebrae: 7 cervical segments are well visualized. Vertebral body height is well maintained. Osteophytic changes are noted at multiple levels. The odontoid is within normal limits. No findings to suggest acute fracture or acute facet abnormality are noted. Soft tissues and spinal canal: Changes of prior right partial thyroidectomy are noted. No other soft tissue abnormality is seen Upper chest: Within normal limits. Other: None IMPRESSION: CT of the head: Normal head CT. CT of the cervical spine: Mild degenerative change without acute abnormality. Electronically Signed   By: Inez Catalina M.D.   On: 06/27/2018 16:20   Ct Angio Chest Pe W And/or Wo Contrast  Result Date: 06/27/2018 CLINICAL DATA:  Concern for pulmonary embolism. Short of breath. Nausea and vomiting. EXAM: CT ANGIOGRAPHY CHEST WITH CONTRAST TECHNIQUE: Multidetector CT imaging of the chest was performed using the standard protocol during bolus administration of intravenous contrast. Multiplanar CT image reconstructions and MIPs were obtained to evaluate the vascular anatomy. CONTRAST:  <See Chart> ISOVUE-370 IOPAMIDOL (ISOVUE-370) INJECTION 76% COMPARISON:  Radiograph 06/27/2018, CT 312 1,010 FINDINGS: Cardiovascular: No filling defects within the pulmonary arteries to suggest acute pulmonary embolism. No acute findings of the aorta or great vessels. No pericardial fluid. No significant  vascular findings. Normal heart size. No pericardial effusion. Mediastinum/Nodes: No axillary or supraclavicular adenopathy. No mediastinal hilar adenopathy. No pericardial effusion. Esophagus normal. Lungs/Pleura: No suspicious pulmonary nodules. No pulmonary infarction. No airspace disease. No pleural fluid. No pulmonary edema. Upper Abdomen: Limited view of the liver, kidneys, pancreas are unremarkable. Normal adrenal glands. Musculoskeletal: No acute osseous abnormality. Degenerative osteophytosis of the spine. Review of the MIP images confirms the above findings. IMPRESSION: 1. No acute pulmonary embolism. 2. No pulmonary infarction or infection.  No pulmonary edema. 3. Degenerate spurring of spine. Electronically Signed   By: Suzy Bouchard M.D.   On: 06/27/2018 18:53   Ct Cervical Spine Wo Contrast  Result Date: 06/27/2018 CLINICAL DATA:  Recent fall following syncopal episode EXAM: CT HEAD WITHOUT CONTRAST CT CERVICAL SPINE WITHOUT CONTRAST TECHNIQUE: Multidetector CT imaging of the head and cervical spine was performed following the standard protocol without intravenous contrast. Multiplanar CT image reconstructions of the cervical spine were also generated. COMPARISON:  None. FINDINGS: CT HEAD FINDINGS Brain: No evidence of acute infarction, hemorrhage, hydrocephalus, extra-axial collection or mass lesion/mass effect. Vascular: No hyperdense vessel or unexpected calcification. Skull: Normal. Negative for fracture or focal lesion. Sinuses/Orbits: No acute finding. Other: None CT CERVICAL SPINE FINDINGS Alignment: Within normal limits. Skull base and vertebrae: 7 cervical segments are well visualized. Vertebral body height is well maintained. Osteophytic changes are noted at multiple levels. The odontoid is within normal limits. No findings to suggest acute fracture or acute facet abnormality are noted. Soft tissues and spinal canal: Changes of prior right partial thyroidectomy are noted. No other soft  tissue abnormality is seen Upper chest: Within normal limits. Other: None IMPRESSION: CT of the head: Normal head  CT. CT of the cervical spine: Mild degenerative change without acute abnormality. Electronically Signed   By: Inez Catalina M.D.   On: 06/27/2018 16:20   Echo: normal systolic and diastolic function with normal chamber sizes. eEF 60-65%.  Results/Tests Pending at Time of Discharge: none  Discharge Medications:  Allergies as of 06/29/2018   No Known Allergies     Medication List    STOP taking these medications   lisinopril-hydrochlorothiazide 20-25 MG tablet Commonly known as:  PRINZIDE,ZESTORETIC   meloxicam 15 MG tablet Commonly known as:  MOBIC   naproxen 500 MG tablet Commonly known as:  NAPROSYN     TAKE these medications   aspirin 81 MG EC tablet Commonly known as:  ASPIRIN 81 Take 1 tablet (81 mg total) by mouth daily.   atorvastatin 40 MG tablet Commonly known as:  LIPITOR Take 1 tablet (40 mg total) by mouth daily.   Blood Glucose Monitoring Suppl w/Device Kit Test blood sugars every morning prior to eating or drinking.   gabapentin 100 MG capsule Commonly known as:  NEURONTIN Take 1 capsule (100 mg total) by mouth 3 (three) times daily.   glucose blood test strip Commonly known as:  ONE TOUCH ULTRA TEST Check sugar 2 x daily   ibuprofen 200 MG tablet Commonly known as:  ADVIL,MOTRIN Take 800 mg by mouth every 6 (six) hours as needed for mild pain.   KLOR-CON M20 20 MEQ tablet Generic drug:  potassium chloride SA TAKE 1 TABLET BY MOUTH TWICE A DAY   levothyroxine 50 MCG tablet Commonly known as:  SYNTHROID, LEVOTHROID Take 1 tablet (50 mcg total) by mouth daily.   loratadine 10 MG tablet Commonly known as:  CLARITIN Take 1 tablet (10 mg total) by mouth daily.   metFORMIN 500 MG tablet Commonly known as:  GLUCOPHAGE Take 2 tablets (1,000 mg total) by mouth 2 (two) times daily with a meal.   onetouch ultrasoft lancets Use as  instructed   sertraline 100 MG tablet Commonly known as:  ZOLOFT Take 1 tablet (100 mg total) by mouth daily.       Discharge Instructions: Please refer to Patient Instructions section of EMR for full details.  Patient was counseled important signs and symptoms that should prompt return to medical care, changes in medications, dietary instructions, activity restrictions, and follow up appointments.   Follow-Up Appointments:   Richarda Osmond, DO 06/28/2018, 6:10 PM PGY-1, [place]

## 2018-06-28 NOTE — Progress Notes (Signed)
PT Cancellation Note  Patient Details Name: Candice Warner MRN: [id number] DOB: 20-Jun-1950   Cancelled Treatment:    Reason Eval/Treat Not Completed: PT screened, no needs identified, will sign off. Spoke with OT and pt who both report pt being back to baseline from functional standpoint, PT will sign off.    Welda Azzarello L Orvill Coulthard 06/28/2018, 9:40 AM

## 2018-06-28 NOTE — Evaluation (Signed)
Occupational Therapy Evaluation Patient Details Name: Candice Warner MRN: [id number] DOB: [birth year] Today's Date: 06/28/2018    History of Present Illness 68 y.o. female presenting with syncopal event; falling backwards and hitting her head. Sustained a small posterior head laceration. CT head and neck negative. PMH is significant for T2DM, Hypothyroidism, HTN, HLD, Depression, Chronic Back and Hip Pain.    Clinical Impression   PTA, pt was living with her significant other and was independent and working full time. Pt currently performing ADLs and functional mobility at Mod I - Independent level with increased time as needed. Pt presenting near baseline function and denies any dizziness or fatigue. Recommend dc home once medically stable per physician. All acute OT needs met and will sign off.   BP: Supine 103/58 Sitting EOB 107/58 Standing 101/60 End of session after activity 113/50    Follow Up Recommendations  No OT follow up    Equipment Recommendations  None recommended by OT    Recommendations for Other Services       Precautions / Restrictions        Mobility Bed Mobility Overal bed mobility: Independent                Transfers Overall transfer level: Independent                    Balance Overall balance assessment: No apparent balance deficits (not formally assessed)                                         ADL either performed or assessed with clinical judgement   ADL Overall ADL's : Modified independent                                       General ADL Comments: Pt performing ADLs at Mod I level with increased time as needed. Pt demonstrating near baseline function     Vision Baseline Vision/History: Wears glasses Wears Glasses: At all times Patient Visual Report: No change from baseline       Perception     Praxis      Pertinent Vitals/Pain Pain Assessment: Faces Faces Pain Scale: Hurts a  little bit Pain Location: Neck Pain Descriptors / Indicators: Discomfort Pain Intervention(s): Monitored during session;Repositioned;RN gave pain meds during session     Hand Dominance Right   Extremity/Trunk Assessment Upper Extremity Assessment Upper Extremity Assessment: Overall WFL for tasks assessed   Lower Extremity Assessment Lower Extremity Assessment: Overall WFL for tasks assessed   Cervical / Trunk Assessment Cervical / Trunk Assessment: Normal   Communication Communication Communication: No difficulties   Cognition Arousal/Alertness: Awake/alert Behavior During Therapy: WFL for tasks assessed/performed Overall Cognitive Status: Within Functional Limits for tasks assessed                                     General Comments  Discussed concussion symptoms; pt verbalized udnerstanding    Exercises     Shoulder Instructions      Home Living Family/patient expects to be discharged to:: Private residence Living Arrangements: Spouse/significant other Available Help at Discharge: Friend(s);Available PRN/intermittently Type of Home: House Home Access: Stairs to enter Entrance Stairs-Number of Steps: 3 Entrance Stairs-Rails: None  Home Layout: One level     Bathroom Shower/Tub: Teacher, early years/pre: Handicapped height     Home Equipment: Grab bars - tub/shower;Crutches          Prior Functioning/Environment Level of Independence: Independent        Comments: ADLs, IADLs, drives, Works at [employer]         OT Problem List: Decreased activity tolerance;Decreased knowledge of precautions;Pain      OT Treatment/Interventions:      OT Goals(Current goals can be found in the care plan section) Acute Rehab OT Goals Patient Stated Goal: "Go home soon" OT Goal Formulation: All assessment and education complete, DC therapy  OT Frequency:     Barriers to D/C:            Co-evaluation              AM-PAC OT "6  Clicks" Daily Activity     Outcome Measure Help from another person eating meals?: None Help from another person taking care of personal grooming?: None Help from another person toileting, which includes using toliet, bedpan, or urinal?: None Help from another person bathing (including washing, rinsing, drying)?: None Help from another person to put on and taking off regular upper body clothing?: None Help from another person to put on and taking off regular lower body clothing?: None 6 Click Score: 24   End of Session Nurse Communication: Mobility status;Other (comment)(BP)  Activity Tolerance: Patient tolerated treatment well Patient left: in chair;with call bell/phone within reach  OT Visit Diagnosis: Muscle weakness (generalized) (M62.81);Pain Pain - part of body: (Neck)                Time: 5625-6389 OT Time Calculation (min): 20 min Charges:  OT General Charges $OT Visit: 1 Visit OT Evaluation $OT Eval Low Complexity: 1 Low  Kammy Klett MSOT, OTR/L Acute Rehab Pager: [phone] Office: [place] 06/28/2018, 8:20 AM

## 2018-06-28 NOTE — Progress Notes (Signed)
Family Medicine Teaching Service Daily Progress Note Intern Pager: [phone]  Patient name: Candice Warner Medical record number: [id number] Date of birth: 05/30/1951 Age: 68 y.o. Gender: female  Primary Care Provider: Arlyce Harman, DO Consultants: cards Code Status: full  Pt Overview and Major Events to Date:  1/13 admitted  Assessment and Plan: Candice Warner is a 68 y.o. female presenting with syncopal event . PMH is significant for T2DM, Hypothyroidism, HTN, HLD, Depression, Chronic Back and Hip Pain.  Syncopal Event- no reoccurrence of syncope since admission. Patient states that she is able to ambulate without dizziness/lightheadedness. Has some chronic neck pain and has some pain at site of head laceration. Nurse administered tylenol just prior to our interview so patient doesn't know yet if it is helping. The laceration is non-bleeding, non-infective appearing, staple in place. Orthostatic vital signs were positive for SBP not compensating. Labs for syncope workup that have resulted: hgb today 10.4 (down from 11.5 yesterday but likely dilutional with bolus and maintenance fluids), WBC wnl, glucose 128 this morning, potassium 3.2 (improved from 3.0 yesterday with replete), TSH wnl, anemia panel wnl except RBC 3.55, UDS negative. vitals show borderline low BP (latest 105/54) and bradycardia (latest 56bpm). It appears patient has low BP and HR at baseline. Consulted cardiology to complete syncope workup. - f/u cardiology recommendation - vitals per floor protocol - cardiac monitoring - continuous pulse ox - potassium BID today.  - Echo  - continue to hold BP meds in setting of positive orthostats and AKI - BMP, CBC in AM - CBGs AC/HS - tylenol prn pain - PT/OT eval - up with assistance and fall precautions - carb modified diet - heparin for DVT PPx  AKI Cr. 1.93 on admission improved to 1.47 today. Patient received 1L fluid bolus in ED. - continue to hold home BP  medications - avoid nephrotoxic agents - renally dosed gabapentin - f/u BMP in AM  Urinalysis- on admission showed cloudy, increased specific gravity, moderate leukocytes, many bacteria, negative for nitrites. Patient denies having dysuria. Admits to having increased nocturia for past couple months with occasional foul smelling urine. This is likely reflective of her worsening blood sugar control with increased Hgb A1c. Would not treat with antibiotics at this time. UDS negative, WBC count normal, afebrile. No CVA or suprapubic tenderness on exam.   Hypokalemia- K+ 3.0 on admission.  Given K-Dur x1 in the ED. Potassium 3.2 today and will give twice today. - K+ twice today - BMP am  Hypothyroidism- TSH this admission 1.894 - continue home synthroid daily  T2DM with Neuropathy- A1c this admission 7.6 - renally dosed gabapentin - hold metformin while inpatient - CBGs AC/HS - sSSI  Normocytic Anemia Hgb 11.5 on admission, MCV 90.4.  Hgb decreased to 10.4 today which is likely partially dilutional with IV fluids. Will continue to monitor. Anemia panel returned today wnl except RBC count mildly low to 3.55.  - cont to monitor CBC  HTN- BP normal to low overnight/this morning. Continue to hold home medications in setting of controlled BP and AKI. Patient also had positive orthostatic BP.  - holding lisinopril/HCTZ in the setting of positive orthostats and AKI - repeat orthostatic vitals in AM  HLD On lipitor 40 QD at home. Last lipid panel 07/2017 with LDL 32. - cont home lipitor  Depression Takes zoloft at home. - cont home zoloft  Chronic Low Back Pain and Right Hip Pain Sees Sports Med.  Takes mobic and  naproxen prn at home for pain, but states does not take daily. - hold these in the setting of AKI - tylenol, k-pad prn pain  FEN/GI: carb modified diet Prophylaxis: heparin  Disposition: admit to obs, syncope work-up  Subjective:  Sitting up  in chair. Patient states that she feels well. Pain at laceration site on head and was given tylenol. Chronic neck pain. Patient able to ambulate without symptoms of syncope. Asked patient about urinary symptoms and patient endorses increased nocturia for past few months getting up 3-4 times per night to urinate. Occasional foul smell to urine. Denies any dysuria or discharge.   Objective: Temp:  [97.6 F (36.4 C)-98.2 F (36.8 C)] 98 F (36.7 C) (01/14 0500) Pulse Rate:  [53-144] 56 (01/14 0500) Resp:  [13-27] 18 (01/14 0500) BP: (97-134)/(52-80) 105/54 (01/14 0500) SpO2:  [96 %-100 %] 100 % (01/14 0500) Weight:  [85.2 kg-89.8 kg] 85.2 kg (01/14 0500) Physical Exam: General: NAD, pleasant Cardiovascular: RRR, no murmur appreciated Respiratory: CTAB, no increased WOB Abdomen: soft, non-tender Extremities: no edema,  [place]: alert and oriented, no focal deficits, moving all extremities equally and appropriately Psych: pleasant, cooperative  Laboratory: Recent Labs  Lab 06/27/18 1431 06/28/18 0022  WBC 7.0 7.7  HGB 11.5* 10.4*  HCT 35.8* 31.7*  PLT 325 273   Recent Labs  Lab 06/27/18 1431 06/28/18 0022  NA 140 136  K 3.0* 3.2*  CL 103 102  CO2 22 24  BUN 22 22  CREATININE 1.93* 1.47*  CALCIUM 9.5 8.3*  PROT 8.3*  --   BILITOT 1.2  --   ALKPHOS 68  --   ALT 17  --   AST 25  --   GLUCOSE 148* 128*   TSH 1.894 A1c 7.6 Anemia panel wnl UDS negative  Urinalysis    Component Value Date/Time   COLORURINE YELLOW 06/27/2018 2319   APPEARANCEUR CLOUDY (A) 06/27/2018 2319   LABSPEC 1.035 (H) 06/27/2018 2319   PHURINE 7.0 06/27/2018 2319   GLUCOSEU NEGATIVE 06/27/2018 2319   HGBUR SMALL (A) 06/27/2018 2319   BILIRUBINUR NEGATIVE 06/27/2018 2319   BILIRUBINUR NEG 04/26/2015 1643   KETONESUR NEGATIVE 06/27/2018 2319   PROTEINUR 30 (A) 06/27/2018 2319   UROBILINOGEN 0.2 04/26/2015 1643   NITRITE NEGATIVE 06/27/2018 2319   LEUKOCYTESUR MODERATE (A) 06/27/2018 2319       Imaging/Diagnostic Tests: Dg Chest 2 View  Result Date: 06/27/2018 CLINICAL DATA:  Syncope. EXAM: CHEST - 2 VIEW COMPARISON:  02/22/2009 FINDINGS: The heart size and mediastinal contours are within normal limits. Both lungs are clear. No acute bone abnormality. Degenerative changes at the shoulders. Previous resection of the distal right clavicle. IMPRESSION: No active cardiopulmonary disease. Electronically Signed   By: Francene BoyersJames  Maxwell M.D.   On: 06/27/2018 16:44   Ct Head Wo Contrast  Result Date: 06/27/2018 CLINICAL DATA:  Recent fall following syncopal episode EXAM: CT HEAD WITHOUT CONTRAST CT CERVICAL SPINE WITHOUT CONTRAST TECHNIQUE: Multidetector CT imaging of the head and cervical spine was performed following the standard protocol without intravenous contrast. Multiplanar CT image reconstructions of the cervical spine were also generated. COMPARISON:  None. FINDINGS: CT HEAD FINDINGS Brain: No evidence of acute infarction, hemorrhage, hydrocephalus, extra-axial collection or mass lesion/mass effect. Vascular: No hyperdense vessel or unexpected calcification. Skull: Normal. Negative for fracture or focal lesion. Sinuses/Orbits: No acute finding. Other: None CT CERVICAL SPINE FINDINGS Alignment: Within normal limits. Skull base and vertebrae: 7 cervical segments are well visualized. Vertebral body height is well maintained.  Osteophytic changes are noted at multiple levels. The odontoid is within normal limits. No findings to suggest acute fracture or acute facet abnormality are noted. Soft tissues and spinal canal: Changes of prior right partial thyroidectomy are noted. No other soft tissue abnormality is seen Upper chest: Within normal limits. Other: None IMPRESSION: CT of the head: Normal head CT. CT of the cervical spine: Mild degenerative change without acute abnormality. Electronically Signed   By: Alcide Clever M.D.   On: 06/27/2018 16:20   Ct Angio Chest Pe W And/or Wo  Contrast  Result Date: 06/27/2018 CLINICAL DATA:  Concern for pulmonary embolism. Short of breath. Nausea and vomiting. EXAM: CT ANGIOGRAPHY CHEST WITH CONTRAST TECHNIQUE: Multidetector CT imaging of the chest was performed using the standard protocol during bolus administration of intravenous contrast. Multiplanar CT image reconstructions and MIPs were obtained to evaluate the vascular anatomy. CONTRAST:  <See Chart> ISOVUE-370 IOPAMIDOL (ISOVUE-370) INJECTION 76% COMPARISON:  Radiograph 06/27/2018, CT 312 1,010 FINDINGS: Cardiovascular: No filling defects within the pulmonary arteries to suggest acute pulmonary embolism. No acute findings of the aorta or great vessels. No pericardial fluid. No significant vascular findings. Normal heart size. No pericardial effusion. Mediastinum/Nodes: No axillary or supraclavicular adenopathy. No mediastinal hilar adenopathy. No pericardial effusion. Esophagus normal. Lungs/Pleura: No suspicious pulmonary nodules. No pulmonary infarction. No airspace disease. No pleural fluid. No pulmonary edema. Upper Abdomen: Limited view of the liver, kidneys, pancreas are unremarkable. Normal adrenal glands. Musculoskeletal: No acute osseous abnormality. Degenerative osteophytosis of the spine. Review of the MIP images confirms the above findings. IMPRESSION: 1. No acute pulmonary embolism. 2. No pulmonary infarction or infection.  No pulmonary edema. 3. Degenerate spurring of spine. Electronically Signed   By: Genevive Bi M.D.   On: 06/27/2018 18:53   Ct Cervical Spine Wo Contrast  Result Date: 06/27/2018 CLINICAL DATA:  Recent fall following syncopal episode EXAM: CT HEAD WITHOUT CONTRAST CT CERVICAL SPINE WITHOUT CONTRAST TECHNIQUE: Multidetector CT imaging of the head and cervical spine was performed following the standard protocol without intravenous contrast. Multiplanar CT image reconstructions of the cervical spine were also generated. COMPARISON:  None. FINDINGS: CT HEAD  FINDINGS Brain: No evidence of acute infarction, hemorrhage, hydrocephalus, extra-axial collection or mass lesion/mass effect. Vascular: No hyperdense vessel or unexpected calcification. Skull: Normal. Negative for fracture or focal lesion. Sinuses/Orbits: No acute finding. Other: None CT CERVICAL SPINE FINDINGS Alignment: Within normal limits. Skull base and vertebrae: 7 cervical segments are well visualized. Vertebral body height is well maintained. Osteophytic changes are noted at multiple levels. The odontoid is within normal limits. No findings to suggest acute fracture or acute facet abnormality are noted. Soft tissues and spinal canal: Changes of prior right partial thyroidectomy are noted. No other soft tissue abnormality is seen Upper chest: Within normal limits. Other: None IMPRESSION: CT of the head: Normal head CT. CT of the cervical spine: Mild degenerative change without acute abnormality. Electronically Signed   By: Alcide Clever M.D.   On: 06/27/2018 16:20    Leeroy Bock, DO 06/28/2018, 7:39 AM PGY-1, [facility] FPTS Intern pager: [phone], text pages welcome

## 2018-06-28 NOTE — Progress Notes (Signed)
  Echocardiogram 2D Echocardiogram has been performed.  Pieter Partridge 06/28/2018, 10:08 AM

## 2018-06-28 NOTE — Consult Note (Signed)
Cardiology Consultation:   [name] MRN: [id number]; DOB: September 21, 1950  Admit date: 06/27/2018 Date of Consult: 06/28/2018  Primary Care Provider: [name], DO Primary Cardiologist: Dr. Rowland Lathe  Primary Electrophysiologist:  None    Patient Profile:   Candice Warner is a 68 y.o. female with a hx of hypertension and normal coronary arteries who is being seen today for the evaluation of syncope at the request of Dr. Mallie Mussel.  History of Present Illness:   Candice Warner 68 year old moderately overweight widowed African-American female mother of 4 children who was admitted yesterday with witnessed syncope.  She has a history of treated hypertension, hyperlipidemia and diabetes.  She saw Dr. Aundra Dubin 5 years ago for work-up of chest pain.  A Myoview stress test suggested anterior ischemia and a cardiac catheterization showed essentially normal coronary arteries.  She became bradycardic during heart cath with heart rates in the 30s and ultimately wore a Holter monitor which did not demonstrate this.  She works in [employer].  She apparently developed some nausea at work, sat down and eventually found herself on the floor.  She was brought by EMS to [facility].  Her initial serum creatinine was 1.93 with a baseline of 0.8.  She was hypokalemic as well.  She was given IV fluids.  Her EKG showed no acute changes.  She did suffer a small laceration to her scalp.  Past Medical History:  Diagnosis Date  . Arthritis    "right hip" (06/27/2018)  . Depression   . Hyperlipidemia   . Hypertension   . Hypokalemia   . Hypothyroidism   . Lumbar back pain   . Obesity   . Radiculopathy   . Thyroid disease   . Type II diabetes mellitus ([place])     Past Surgical History:  Procedure Laterality Date  . CYST REMOVAL HAND Right 11/07/2015   Procedure: EXCISION OF VOLAR RETANICULUM RIGHT HAND;  Surgeon: Ninetta Lights, MD;  Location: [place];   Service: Orthopedics;  Laterality: Right;  . SHOULDER ARTHROSCOPY Left 2002  . SHOULDER ARTHROSCOPY WITH ROTATOR CUFF REPAIR AND SUBACROMIAL DECOMPRESSION  07/14/2012   Procedure: SHOULDER ARTHROSCOPY WITH ROTATOR CUFF REPAIR AND SUBACROMIAL DECOMPRESSION;  Surgeon: Ninetta Lights, MD;  Location: [place];  Service: Orthopedics;  Laterality: Right;  Right Shoulder Arthroscopy, Distal Claviculectomy, Subacromial Decompression, Partial Acromioplasty with Coracoacromial Release with Arthroscopic Rotator Cuff Repair   . THYROID LOBECTOMY Right    Goiter s/p right thyroidectomy  . TRIGGER FINGER RELEASE Right 11/07/2015   Procedure: RELEASE TRIGGER FINGER/A-1 PULLEY RIGHT RING FINGER ;  Surgeon: Ninetta Lights, MD;  Location: [place];  Service: Orthopedics;  Laterality: Right;  . TUBAL LIGATION       Home Medications:  Prior to Admission medications   Medication Sig Start Date End Date Taking? Authorizing Provider  aspirin (ASPIR-81) 81 MG EC tablet Take 1 tablet (81 mg total) by mouth daily. 08/03/17  Yes Mikell, Jeani Sow, MD  atorvastatin (LIPITOR) 40 MG tablet Take 1 tablet (40 mg total) by mouth daily. 07/27/17  Yes Mikell, Jeani Sow, MD  gabapentin (NEURONTIN) 100 MG capsule Take 1 capsule (100 mg total) by mouth 3 (three) times daily. 08/03/17  Yes Mikell, Jeani Sow, MD  ibuprofen (ADVIL,MOTRIN) 200 MG tablet Take 800 mg by mouth every 6 (six) hours as needed for mild pain.   Yes [provider]  levothyroxine (SYNTHROID, LEVOTHROID) 50 MCG tablet Take  1 tablet (50 mcg total) by mouth daily. 03/17/18  Yes Lockamy, Timothy, DO  lisinopril-hydrochlorothiazide (PRINZIDE,ZESTORETIC) 20-25 MG tablet Take 1 tablet by mouth daily. 02/02/18  Yes Lockamy, Timothy, DO  loratadine (CLARITIN) 10 MG tablet Take 1 tablet (10 mg total) by mouth daily. 07/01/17  Yes Mikell, Jeani Sow, MD  meloxicam (MOBIC) 15 MG tablet TAKE 1 TABLET BY MOUTH EVERY DAY Patient  taking differently: Take 15 mg by mouth daily as needed for pain.  03/07/18  Yes [name], DO  metFORMIN (GLUCOPHAGE) 500 MG tablet Take 2 tablets (1,000 mg total) by mouth 2 (two) times daily with a meal. 08/03/17  Yes Mikell, Jeani Sow, MD  naproxen (NAPROSYN) 500 MG tablet Take 1 tablet (500 mg total) by mouth 2 (two) times daily as needed. 06/16/16  Yes Draper, Christia Reading R, DO  sertraline (ZOLOFT) 100 MG tablet Take 1 tablet (100 mg total) by mouth daily. 02/03/18  Yes [name], DO  Blood Glucose Monitoring Suppl W/DEVICE KIT Test blood sugars every morning prior to eating or drinking. 09/19/13   Kuneff, Renee A, DO  glucose blood (ONE TOUCH ULTRA TEST) test strip Check sugar 2 x daily 10/03/13   Kuneff, Renee A, DO  KLOR-CON M20 20 MEQ tablet TAKE 1 TABLET BY MOUTH TWICE A DAY Patient not taking: Reported on 06/27/2018 07/24/14   Howard Pouch A, DO  Lancets (ONETOUCH ULTRASOFT) lancets Use as instructed 10/03/13   Howard Pouch A, DO    Inpatient Medications: Scheduled Meds: . aspirin EC  81 mg Oral Daily  . atorvastatin  40 mg Oral Daily  . feeding supplement (ENSURE ENLIVE)  237 mL Oral BID BM  . gabapentin  100 mg Oral BID  . heparin  5,000 Units Subcutaneous Q8H  . insulin aspart  0-9 Units Subcutaneous TID WC  . levothyroxine  50 mcg Oral Daily  . loratadine  10 mg Oral Daily  . potassium chloride  40 mEq Oral Once  . sertraline  100 mg Oral Daily  . sodium chloride flush  3 mL Intravenous Q12H   Continuous Infusions:  PRN Meds: acetaminophen **OR** acetaminophen  Allergies:   No Known Allergies  Social History:   Social History   Socioeconomic History  . Marital status: Widowed    Spouse name: Not on file  . Number of children: Not on file  . Years of education: Not on file  . Highest education level: Not on file  Occupational History  . Not on file  Social Needs  . Financial resource strain: Not on file  . Food insecurity:    Worry: Not on file     Inability: Not on file  . Transportation needs:    Medical: Not on file    Non-medical: Not on file  Tobacco Use  . Smoking status: Never Smoker  . Smokeless tobacco: Never Used  Substance and Sexual Activity  . Alcohol use: Never    Frequency: Never  . Drug use: Never  . Sexual activity: Not Currently  Lifestyle  . Physical activity:    Days per week: Not on file    Minutes per session: Not on file  . Stress: Not on file  Relationships  . Social connections:    Talks on phone: Not on file    Gets together: Not on file    Attends religious service: Not on file    Active member of club or organization: Not on file    Attends meetings of clubs or organizations:  Not on file    Relationship status: Not on file  . Intimate partner violence:    Fear of current or ex partner: Not on file    Emotionally abused: Not on file    Physically abused: Not on file    Forced sexual activity: Not on file  Other Topics Concern  . 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  Divorced, 4 children, 1 adopted.  Husband died 04/28/2023 from colon CA.; Son murdered (hanging) 1999/07/29, father died 08/07/99.  Lives w/ 61yo son (adopted).     Family History:    Family History  Problem Relation Age of Onset  . Heart disease Mother   . Heart disease Father   . Breast cancer Maternal Aunt      ROS:  Please see the history of present illness.   All other ROS reviewed and negative.     Physical Exam/Data:   Vitals:   06/27/18 August 06, 2106 06/27/18 2347 06/28/18 0500 06/28/18 1159  BP: (!) 133/58 (!) 116/54 (!) 105/54 (!) 116/53  Pulse: (!) 59 (!) 57 (!) 56 62  Resp: _0 Temp: 98.1 F (36.7 C) 98.2 F (36.8 C) 98 F (36.7 C) 98 F (36.7 C)  TempSrc: Oral Oral Oral Oral  SpO2: 99% 100% 100% 100%  Weight:   85.2 kg   Height:        Intake/Output Summary (Last 24 hours) at 06/28/2018 1609 Last data filed at 06/28/2018 1322 Gross per 24 hour  Intake  1480 ml  Output 1 ml  Net 1479 ml   Last 3 Weights 06/28/2018 06/27/2018 03/15/2018  Weight (lbs) 187 lb 13.3 oz 198 lb 192 lb 9.6 oz  Weight (kg) 85.2 kg 89.812 kg 87.363 kg     Body mass index is 29.86 kg/m.  General:  Well nourished, well developed, in no acute distress HEENT: normal Lymph: no adenopathy Neck: no JVD Endocrine:  No thryomegaly Vascular: No carotid bruits; FA pulses 2+ bilaterally without bruits  Cardiac:  normal S1, S2; RRR; no murmur  Lungs:  clear to auscultation bilaterally, no wheezing, rhonchi or rales  Abd: soft, nontender, no hepatomegaly  Ext: no edema Musculoskeletal:  No deformities, BUE and BLE strength normal and equal Skin: warm and dry  Neuro:  CNs 2-12 intact, no focal abnormalities noted Psych:  Normal affect   EKG:  The EKG was personally reviewed and demonstrates: Sinus bradycardia 59 without ST or T wave changes. Telemetry:  Telemetry was personally reviewed and demonstrates: Normal sinus rhythm  Relevant CV Studies:  2D echocardiogram (06/28/2018)  Study Conclusions  - Left ventricle: The cavity size was normal. Systolic function was   normal. The estimated ejection fraction was in the range of 60%   to 65%. Wall motion was normal; there were no regional wall   motion abnormalities. Left ventricular diastolic function   parameters were normal. - Atrial septum: No defect or patent foramen ovale was identified.  Laboratory Data:  Chemistry Recent Labs  Lab 06/27/18 1431 06/28/18 0022  NA 140 136  K 3.0* 3.2*  CL 103 102  CO2 22 24  GLUCOSE 148* 128*  BUN 22 22  CREATININE 1.93* 1.47*  CALCIUM 9.5 8.3*  GFRNONAA 26* 36*  GFRAA 30* 42*  ANIONGAP 15 10    Recent Labs  Lab 06/27/18 1431  PROT 8.3*  ALBUMIN 4.1  AST 25  ALT 17  ALKPHOS  68  BILITOT 1.2   Hematology Recent Labs  Lab 06/27/18 1431 06/28/18 0022  WBC 7.0 7.7  RBC 3.96 3.55*  3.55*  HGB 11.5* 10.4*  HCT 35.8* 31.7*  MCV 90.4 89.3  MCH 29.0 29.3    MCHC 32.1 32.8  RDW 13.0 13.2  PLT 325 273   Cardiac EnzymesNo results for input(s): TROPONINI in the last 168 hours.  Recent Labs  Lab 06/27/18 1502  TROPIPOC 0.00    BNPNo results for input(s): BNP, PROBNP in the last 168 hours.  DDimer  Recent Labs  Lab 06/27/18 1431  DDIMER 15.34*    Radiology/Studies:  Dg Chest 2 View  Result Date: 06/27/2018 CLINICAL DATA:  Syncope. EXAM: CHEST - 2 VIEW COMPARISON:  02/22/2009 FINDINGS: The heart size and mediastinal contours are within normal limits. Both lungs are clear. No acute bone abnormality. Degenerative changes at the shoulders. Previous resection of the distal right clavicle. IMPRESSION: No active cardiopulmonary disease. Electronically Signed   By: Lorriane Shire M.D.   On: 06/27/2018 16:44   Ct Head Wo Contrast  Result Date: 06/27/2018 CLINICAL DATA:  Recent fall following syncopal episode EXAM: CT HEAD WITHOUT CONTRAST CT CERVICAL SPINE WITHOUT CONTRAST TECHNIQUE: Multidetector CT imaging of the head and cervical spine was performed following the standard protocol without intravenous contrast. Multiplanar CT image reconstructions of the cervical spine were also generated. COMPARISON:  None. FINDINGS: CT HEAD FINDINGS Brain: No evidence of acute infarction, hemorrhage, hydrocephalus, extra-axial collection or mass lesion/mass effect. Vascular: No hyperdense vessel or unexpected calcification. Skull: Normal. Negative for fracture or focal lesion. Sinuses/Orbits: No acute finding. Other: None CT CERVICAL SPINE FINDINGS Alignment: Within normal limits. Skull base and vertebrae: 7 cervical segments are well visualized. Vertebral body height is well maintained. Osteophytic changes are noted at multiple levels. The odontoid is within normal limits. No findings to suggest acute fracture or acute facet abnormality are noted. Soft tissues and spinal canal: Changes of prior right partial thyroidectomy are noted. No other soft tissue abnormality is  seen Upper chest: Within normal limits. Other: None IMPRESSION: CT of the head: Normal head CT. CT of the cervical spine: Mild degenerative change without acute abnormality. Electronically Signed   By: Inez Catalina M.D.   On: 06/27/2018 16:20   Ct Angio Chest Pe W And/or Wo Contrast  Result Date: 06/27/2018 CLINICAL DATA:  Concern for pulmonary embolism. Short of breath. Nausea and vomiting. EXAM: CT ANGIOGRAPHY CHEST WITH CONTRAST TECHNIQUE: Multidetector CT imaging of the chest was performed using the standard protocol during bolus administration of intravenous contrast. Multiplanar CT image reconstructions and MIPs were obtained to evaluate the vascular anatomy. CONTRAST:  <See Chart> ISOVUE-370 IOPAMIDOL (ISOVUE-370) INJECTION 76% COMPARISON:  Radiograph 06/27/2018, CT 312 1,010 FINDINGS: Cardiovascular: No filling defects within the pulmonary arteries to suggest acute pulmonary embolism. No acute findings of the aorta or great vessels. No pericardial fluid. No significant vascular findings. Normal heart size. No pericardial effusion. Mediastinum/Nodes: No axillary or supraclavicular adenopathy. No mediastinal hilar adenopathy. No pericardial effusion. Esophagus normal. Lungs/Pleura: No suspicious pulmonary nodules. No pulmonary infarction. No airspace disease. No pleural fluid. No pulmonary edema. Upper Abdomen: Limited view of the liver, kidneys, pancreas are unremarkable. Normal adrenal glands. Musculoskeletal: No acute osseous abnormality. Degenerative osteophytosis of the spine. Review of the MIP images confirms the above findings. IMPRESSION: 1. No acute pulmonary embolism. 2. No pulmonary infarction or infection.  No pulmonary edema. 3. Degenerate spurring of spine. Electronically Signed   By: Suzy Bouchard  M.D.   On: 06/27/2018 18:53   Ct Cervical Spine Wo Contrast  Result Date: 06/27/2018 CLINICAL DATA:  Recent fall following syncopal episode EXAM: CT HEAD WITHOUT CONTRAST CT CERVICAL SPINE  WITHOUT CONTRAST TECHNIQUE: Multidetector CT imaging of the head and cervical spine was performed following the standard protocol without intravenous contrast. Multiplanar CT image reconstructions of the cervical spine were also generated. COMPARISON:  None. FINDINGS: CT HEAD FINDINGS Brain: No evidence of acute infarction, hemorrhage, hydrocephalus, extra-axial collection or mass lesion/mass effect. Vascular: No hyperdense vessel or unexpected calcification. Skull: Normal. Negative for fracture or focal lesion. Sinuses/Orbits: No acute finding. Other: None CT CERVICAL SPINE FINDINGS Alignment: Within normal limits. Skull base and vertebrae: 7 cervical segments are well visualized. Vertebral body height is well maintained. Osteophytic changes are noted at multiple levels. The odontoid is within normal limits. No findings to suggest acute fracture or acute facet abnormality are noted. Soft tissues and spinal canal: Changes of prior right partial thyroidectomy are noted. No other soft tissue abnormality is seen Upper chest: Within normal limits. Other: None IMPRESSION: CT of the head: Normal head CT. CT of the cervical spine: Mild degenerative change without acute abnormality. Electronically Signed   By: Inez Catalina M.D.   On: 06/27/2018 16:20    Assessment and Plan:   1. Syncope- witnessed syncope.  Probably multifactorial.  She was on lisinopril hydrochlorothiazide for hypertension.  Her episode was preceded by nausea.  I suspect there was a component of vasovagal syncope along with dehydration from her diuretic with elevated serum creatinine.  Her 2D echo was entirely normal.  Telemetry revealed sinus rhythm.  I will discontinue her hydrochlorothiazide.  I suspect her serum creatinine will return to normal.  No further work-up is necessary at this time. 2. Essential hypertension- on lisinopril hydrochlorothiazide.  Her blood pressures are normotensive to low.  She was orthostatic.  I would hold her blood  pressure medicines for now.  Should her blood pressure rise she can be placed on an ACE inhibitor if her renal function improves back to baseline.  I would not start her back on a diuretic. 3. Hyperlipidemia- on statin therapy  [facility] will sign off.   Medication Recommendations: Discontinue hydrochlorothiazide Other recommendations (labs, testing, etc): None Follow up as an outpatient: No outpatient follow-up required  For questions or updates, please contact [facility] Please consult [email or web address] for contact info under     Signed, Quay Burow, MD  06/28/2018 4:09 PM

## 2018-06-29 LAB — CBC
HCT: 31.4 % — ABNORMAL LOW (ref 36.0–46.0)
Hemoglobin: 9.9 g/dL — ABNORMAL LOW (ref 12.0–15.0)
MCH: 28.3 pg (ref 26.0–34.0)
MCHC: 31.5 g/dL (ref 30.0–36.0)
MCV: 89.7 fL (ref 80.0–100.0)
Platelets: 249 10*3/uL (ref 150–400)
RBC: 3.5 MIL/uL — ABNORMAL LOW (ref 3.87–5.11)
RDW: 13 % (ref 11.5–15.5)
WBC: 5.4 10*3/uL (ref 4.0–10.5)
nRBC: 0 % (ref 0.0–0.2)

## 2018-06-29 LAB — COMPREHENSIVE METABOLIC PANEL
ALT: 16 U/L (ref 0–44)
AST: 20 U/L (ref 15–41)
Albumin: 3.2 g/dL — ABNORMAL LOW (ref 3.5–5.0)
Alkaline Phosphatase: 62 U/L (ref 38–126)
Anion gap: 8 (ref 5–15)
BUN: 19 mg/dL (ref 8–23)
CO2: 26 mmol/L (ref 22–32)
Calcium: 8.9 mg/dL (ref 8.9–10.3)
Chloride: 108 mmol/L (ref 98–111)
Creatinine, Ser: 1.04 mg/dL — ABNORMAL HIGH (ref 0.44–1.00)
GFR calc Af Amer: >60 mL/min (ref >60)
GFR calc non Af Amer: 55 mL/min — ABNORMAL LOW (ref >60)
GLUCOSE: 151 mg/dL — AB (ref 70–99)
Potassium: 3.8 mmol/L (ref 3.5–5.1)
Sodium: 142 mmol/L (ref 135–145)
TOTAL PROTEIN: 6.9 g/dL (ref 6.5–8.1)
Total Bilirubin: 0.5 mg/dL (ref 0.3–1.2)

## 2018-06-29 LAB — GLUCOSE, CAPILLARY: Glucose-Capillary: 140 mg/dL — ABNORMAL HIGH (ref 70–99)

## 2018-06-29 NOTE — Discharge Instructions (Signed)
Was a pleasure taking care of you in the hospital.  We have stopped your blood pressure medications, it is up to your primary care doctor to restart them to make sure you follow-up within 1 week of discharge.  Please make sure to stay very well-hydrated and eat regular meals throughout the day.  Please also make sure to follow-up with your primary care doctor for staple removal.

## 2018-06-29 NOTE — Progress Notes (Signed)
Discharge instructions given. Pt verbalized understanding and all questions were answered.  

## 2018-06-29 NOTE — Progress Notes (Signed)
I will cosign the resident's note once it is completed.  The patient denies any concern today. Feels much better.   Syncope:  Workup has been good.  We are awaiting Cards clearance. PT recommendation reviewed. ECHO looks fine. We will hold her antihypertensive agents, for now, till f/u with her PCP. Note that she will need f/u with PCP for his scalp wound check and stable removal.  HTN: BP soft on her home regimen. We will hold antihypertensive.  Outpatient f/u with PCP for BP management.  AKI: Improved from 1.93 to 1.47. Continue oral hydration and avoid nephrotoxic agents.

## 2018-07-05 ENCOUNTER — Ambulatory Visit: Payer: BLUE CROSS/BLUE SHIELD | Admitting: Family Medicine

## 2018-07-05 ENCOUNTER — Encounter: Payer: Self-pay | Admitting: Family Medicine

## 2018-07-05 ENCOUNTER — Other Ambulatory Visit: Payer: Self-pay

## 2018-07-05 VITALS — BP 138/64 | HR 54 | Temp 97.7°F | Ht 67.0 in | Wt 191.6 lb

## 2018-07-05 DIAGNOSIS — I10 Essential (primary) hypertension: Secondary | ICD-10-CM | POA: Diagnosis not present

## 2018-07-05 DIAGNOSIS — E119 Type 2 diabetes mellitus without complications: Secondary | ICD-10-CM

## 2018-07-05 NOTE — Patient Instructions (Signed)
It was great to see you today! Thank you for letting me participate in your care!  Today, I removed the staple and was glad to hear you are not having any more symptoms of dizziness or falls. Please return to activity slowly and remember to stand slowly. I will call you if your lab work is abnormal. I will call you when I send in you prescription to restart your blood pressure medication. I will restart Lisinopril (if the lab work is normal) and see you back in one month.  If Advil stops helping with the area where you hit your head or your headache changes in frequency or quality and spreads go to the ED.   Be well, Jules Schick, DO PGY-2, [facility]

## 2018-07-05 NOTE — Progress Notes (Signed)
Subjective: Chief Complaint  Patient presents with  . Suture / Staple Removal     HPI: Candice Warner is a 68 y.o. presenting to clinic today to discuss the following:  Hospital Follow Up Patient is following up due to a recent hospital admission where she stood up too quickly, got dizzy, and fell. She states since hospitalization she has felt well and has had no more episodes of dizziness as long as she takes her time and stands up slowly. Her workup in the hospital was negative for any cardiac source but she is following up with Cardiology for a 30 day event Holter monitor. It was thought that her episode of dizziness and near syncope leading to a fall was caused by a combination of dehydration 2/2 her diuretic medication, poor hydration, and orthostatic hypotension. She was found to have an AKI while in the hospital that improved with IV fluids. Will recheck BMP today.   Removal of single staple from the scalp.  Health Maintenance: diabetic foot exam, microalbumin     ROS noted in HPI.   Past Medical, Surgical, Social, and Family History Reviewed & Updated per EMR.   Pertinent Historical Findings include:   Social History   Tobacco Use  Smoking Status Never Smoker  Smokeless Tobacco Never Used    Objective: BP 138/64   Pulse (!) 54   Temp 97.7 F (36.5 C) (Oral)   Ht 5\' 7"  (1.702 m)   Wt 191 lb 9.6 oz (86.9 kg)   SpO2 98%   BMI 30.01 kg/m  Vitals and nursing notes reviewed  Physical Exam Gen: Alert and Oriented x 3, NAD HEENT: Normocephalic, atraumatic, single metal staple in the occipital region of the scalp. No warmth, erythema, discharge, well healing CV: RRR, no murmurs, normal S1, S2 split Resp: CTAB, no wheezing, rales, or rhonchi, comfortable work of breathing MSK: DM foot exam performed; see quality metrics section Ext: no clubbing, cyanosis, or edema Neuro: No gross deficits Skin: warm, dry, intact, no rashes  Results for orders placed or  performed in visit on 07/05/18 (from the past 72 hour(s))  Basic Metabolic Panel     Status: Abnormal   Collection Time: 07/05/18  4:23 PM  Result Value Ref Range   Glucose 78 65 - 99 mg/dL   BUN 16 8 - 27 mg/dL   Creatinine, Ser 1.10 (H) 0.57 - 1.00 mg/dL   GFR calc non Af Amer 55 (L) >59 mL/min/1.73   GFR calc Af Amer 63 >59 mL/min/1.73   BUN/Creatinine Ratio 15 12 - 28   Sodium 146 (H) 134 - 144 mmol/L   Potassium 4.3 3.5 - 5.2 mmol/L   Chloride 107 (H) 96 - 106 mmol/L   CO2 24 20 - 29 mmol/L   Calcium 9.3 8.7 - 10.3 mg/dL  Microalbumin/Creatinine Ratio, Urine     Status: None   Collection Time: 07/05/18  4:43 PM  Result Value Ref Range   Creatinine, Urine 112.4 Not Estab. mg/dL   Microalbumin, Urine 31.5 Not Estab. ug/mL   Microalb/Creat Ratio 18 0 - 29 mg/g creat    Comment:                        Normal:                0 -  29  Moderately increased: 30 - 300                        Severely Increased:       >300               **Please note reference interval change**     Assessment/Plan:  HYPERTENSION, BENIGN SYSTEMIC Due to recent AKI and episode of orthostatic hypotension leading to fall her regular BP medications have been held with recommendations by Cardiology to start back with a single agent. Agree with Cardiology recs.  - BP well controlled at today's appointment - Will repeat BMP and if AKI is resolved will restart Lisinopril 10mg  and continue to monitor. Prefer ACEi as it is renal protective for diabetics - Stopping HCTZ and will most likely add back another agent only if BP becomes poorly controlled  Staple Removal Single metal staple removed from scalp using sterile staple removal tool, area cleaned with iodine and alcohol wipe and bandage applied with very minimal bleeding. No complications, no signs of infection such as warmth, swelling, erythema, or discharge. Wound was in process of healing well.  PATIENT EDUCATION PROVIDED: See AVS      Diagnosis and plan along with any newly prescribed medication(s) were discussed in detail with this patient today. The patient verbalized understanding and agreed with the plan. Patient advised if symptoms worsen return to clinic or ER.    Orders Placed This Encounter  Procedures  . Basic Metabolic Panel  . Microalbumin/Creatinine Ratio, Urine    No orders of the defined types were placed in this encounter.  Jules Schick, DO 07/05/2018, 3:47 PM PGY-2 [facility]

## 2018-07-06 LAB — BASIC METABOLIC PANEL
BUN/Creatinine Ratio: 15 (ref 12–28)
BUN: 16 mg/dL (ref 8–27)
CALCIUM: 9.3 mg/dL (ref 8.7–10.3)
CO2: 24 mmol/L (ref 20–29)
Chloride: 107 mmol/L — ABNORMAL HIGH (ref 96–106)
Creatinine, Ser: 1.05 mg/dL — ABNORMAL HIGH (ref 0.57–1.00)
GFR calc Af Amer: 63 mL/min/{1.73_m2} (ref >59)
GFR calc non Af Amer: 55 mL/min/{1.73_m2} — ABNORMAL LOW (ref >59)
Glucose: 78 mg/dL (ref 65–99)
Potassium: 4.3 mmol/L (ref 3.5–5.2)
Sodium: 146 mmol/L — ABNORMAL HIGH (ref 134–144)

## 2018-07-06 LAB — MICROALBUMIN / CREATININE URINE RATIO
Creatinine, Urine: 112.4 mg/dL
MICROALB/CREAT RATIO: 18 mg/g{creat} (ref 0–29)
Microalbumin, Urine: 19.8 ug/mL

## 2018-07-07 NOTE — Assessment & Plan Note (Signed)
Due to recent AKI and episode of orthostatic hypotension leading to fall her regular BP medications have been held with recommendations by Cardiology to start back with a single agent. Agree with Cardiology recs.  - BP well controlled at today's appointment - Will repeat BMP and if AKI is resolved will restart Lisinopril 10mg  and continue to monitor. Prefer ACEi as it is renal protective for diabetics - Stopping HCTZ and will most likely add back another agent only if BP becomes poorly controlled

## 2018-07-12 ENCOUNTER — Ambulatory Visit (INDEPENDENT_AMBULATORY_CARE_PROVIDER_SITE_OTHER): Payer: BLUE CROSS/BLUE SHIELD

## 2018-07-12 DIAGNOSIS — R55 Syncope and collapse: Secondary | ICD-10-CM | POA: Diagnosis not present

## 2018-07-13 ENCOUNTER — Other Ambulatory Visit: Payer: Self-pay

## 2018-07-13 DIAGNOSIS — H109 Unspecified conjunctivitis: Secondary | ICD-10-CM

## 2018-07-13 MED ORDER — LORATADINE 10 MG PO TABS: 10.0000 mg | ORAL_TABLET | Freq: Every day | ORAL | 11 refills | Status: DC

## 2018-07-14 ENCOUNTER — Telehealth: Payer: Self-pay

## 2018-07-14 ENCOUNTER — Other Ambulatory Visit: Payer: Self-pay | Admitting: Family Medicine

## 2018-07-14 DIAGNOSIS — I10 Essential (primary) hypertension: Secondary | ICD-10-CM

## 2018-07-14 NOTE — Telephone Encounter (Signed)
Spoke with patient regarding PCP note from 07/14/18 to restart Lisinopril.  Lisinopril not on current med list so looked at past med list and patient was on Lisinopril/HCTZ. Note to PCP to clarify if new Rx for plain Lisinopril needs to be sent based on OV note from 07/05/18 which mentions D/C'ing HCTZ.  Patient will wait to hear back.  Ples Specter, RN [facility] RN)

## 2018-07-14 NOTE — Progress Notes (Signed)
Called patient and left voicemail informing her to restart Lisinopril as her AKI had improved. Also instructed her to return to the clinic for lab work to check bmp and ensure kidney function is back to normal while back on ACEi.

## 2018-07-15 ENCOUNTER — Other Ambulatory Visit: Payer: Self-pay | Admitting: Family Medicine

## 2018-07-15 MED ORDER — LISINOPRIL 10 MG PO TABS: 10.0000 mg | ORAL_TABLET | Freq: Every day | ORAL | 5 refills | Status: DC

## 2018-07-15 NOTE — Telephone Encounter (Signed)
Detailed message left for patient. Ples Specter, RN [facility] RN)

## 2018-07-15 NOTE — Progress Notes (Signed)
Restarting lisinopril after hospital stay due to syncope thought to be due to dehydration and orthostatic hypotension. Stopping HCTZ and continuing Lisinopril to keep BP at goal and also get renal protective benefits.

## 2018-07-24 ENCOUNTER — Other Ambulatory Visit: Payer: Self-pay | Admitting: Family Medicine

## 2018-08-03 ENCOUNTER — Other Ambulatory Visit: Payer: Self-pay | Admitting: Family Medicine

## 2018-08-05 ENCOUNTER — Ambulatory Visit: Payer: BLUE CROSS/BLUE SHIELD | Admitting: Family Medicine

## 2018-08-05 VITALS — BP 125/70 | HR 67 | Temp 98.9°F | Wt 195.2 lb

## 2018-08-05 DIAGNOSIS — G4700 Insomnia, unspecified: Secondary | ICD-10-CM

## 2018-08-05 DIAGNOSIS — I10 Essential (primary) hypertension: Secondary | ICD-10-CM | POA: Diagnosis not present

## 2018-08-05 NOTE — Patient Instructions (Signed)

## 2018-08-05 NOTE — Assessment & Plan Note (Signed)
Patient having difficulty initiating sleep. Could also be a component of OSA however given her poor sleep hygiene I will attempt to get better sleep with corrections to her sleeping habits first. - Educated on good sleep hygiene and focus on getting to bed at the same time every day, no TV or screens of any kind while in bed, no food after going to sleep and eat at least 2 hours before bed time, no caffine beverages after 2pm - Melatonin OTC 1mg  QHS

## 2018-08-05 NOTE — Progress Notes (Signed)
     Subjective: No chief complaint on file.   HPI: Candice Warner is a 68 y.o. presenting to clinic today to discuss the following:  Difficulty Sleeping Patient reports for "a long time" she has had difficulty sleeping. She reports a bedtime of between 7-9pm every night and states she just lays in bed sometimes until morning. She drinks caffinated sodas in the afternoon. She denies feeling uncomfortable or feeling like her mind is racing at night. She recently bought a new mattress thinking that would help but it has not. She has tried Tylenol pm but it did not help. She has not tried anything else. She often feels tired in the morning and at work. She gets home and takes long naps and then does not feel tired at night time. She wakes up twice typically at night and snacks when she wakes up and "sits up for awhile".  F/u for syncope   No more episodes of passing out or feeling like she may pass out. No more falls. She denies dizziness, headaches, nausea, or vomiting. She continues to wear her heart monitor and states she is not having chest pain or palpitations. She has not been checking her blood sugar every day but when she does she states it is usually between 100-180, none over 200. None below 70.  ROS noted in HPI.   Past Medical, Surgical, Social, and Family History Reviewed & Updated per EMR.   Pertinent Historical Findings include:   Social History   Tobacco Use  Smoking Status Never Smoker  Smokeless Tobacco Never Used    Objective: BP 125/70 (BP Location: Right Arm, Patient Position: Sitting, Cuff Size: Normal)   Pulse 67   Temp 98.9 F (37.2 C) (Oral)   Wt 195 lb 3.2 oz (88.5 kg)   SpO2 97%   BMI 30.57 kg/m  Vitals and nursing notes reviewed  Physical Exam Gen: Alert and Oriented x 3, NAD HEENT: Normocephalic, atraumatic CV: RRR, no murmurs, normal S1, S2 split Resp: CTAB, no wheezing, rales, or rhonchi, comfortable work of breathing Ext: no clubbing,  cyanosis, or edema Skin: warm, dry, intact, no rashes  No results found for this or any previous visit (from the past 72 hour(s)).  Assessment/Plan:  Insomnia Patient having difficulty initiating sleep. Could also be a component of OSA however given her poor sleep hygiene I will attempt to get better sleep with corrections to her sleeping habits first. - Educated on good sleep hygiene and focus on getting to bed at the same time every day, no TV or screens of any kind while in bed, no food after going to sleep and eat at least 2 hours before bed time, no caffine beverages after 2pm - Melatonin OTC 1mg  QHS  Syncopal Event No further events and no   PATIENT EDUCATION PROVIDED: See AVS    Diagnosis and plan along with any newly prescribed medication(s) were discussed in detail with this patient today. The patient verbalized understanding and agreed with the plan. Patient advised if symptoms worsen return to clinic or ER.    Orders Placed This Encounter  Procedures  . Basic Metabolic Panel    Jules Schick, DO 08/05/2018, 3:29 PM PGY-2 [facility]

## 2018-08-06 LAB — BASIC METABOLIC PANEL
BUN/Creatinine Ratio: 15 (ref 12–28)
BUN: 14 mg/dL (ref 8–27)
CO2: 24 mmol/L (ref 20–29)
Calcium: 9 mg/dL (ref 8.7–10.3)
Chloride: 105 mmol/L (ref 96–106)
Creatinine, Ser: 0.94 mg/dL (ref 0.57–1.00)
GFR calc Af Amer: 72 mL/min/{1.73_m2} (ref >59)
GFR calc non Af Amer: 63 mL/min/{1.73_m2} (ref >59)
GLUCOSE: 99 mg/dL (ref 65–99)
POTASSIUM: 3.8 mmol/L (ref 3.5–5.2)
Sodium: 145 mmol/L — ABNORMAL HIGH (ref 134–144)

## 2018-08-08 ENCOUNTER — Other Ambulatory Visit: Payer: Self-pay

## 2018-08-08 DIAGNOSIS — E119 Type 2 diabetes mellitus without complications: Secondary | ICD-10-CM

## 2018-08-08 MED ORDER — ATORVASTATIN CALCIUM 40 MG PO TABS: 40.0000 mg | ORAL_TABLET | Freq: Every day | ORAL | 3 refills | Status: DC

## 2018-09-01 ENCOUNTER — Telehealth: Payer: Self-pay

## 2018-09-01 NOTE — Telephone Encounter (Signed)
-----   Message from Runell Gess, MD sent at 08/15/2018 11:26 AM EST ----- 1.  Normal sinus rhythm/sinus tachycardia/sinus bradycardia 2.  No arrhythmias documented

## 2018-09-01 NOTE — Telephone Encounter (Signed)
Runell Gess, MD  Harlow Asa, RN        I haven't seen pt in office. Have her come in to see an APP to review   JJB   Previous Messages    ----- Message -----  From: Harlow Asa, RN  Sent: 08/24/2018  5:10 PM EDT  To: Runell Gess, MD  Subject: Cardiac event monitor results           Looks like monitor was ordered by Georgie Chard, NP (at [facility]) during patient's hospital admission 1/13-1/15   These were the results:   Notes recorded by Runell Gess, MD on 08/15/2018 at 11:26 AM EST  1. Normal sinus rhythm/sinus tachycardia/sinus bradycardia  2. No arrhythmias documented

## 2018-09-01 NOTE — Telephone Encounter (Signed)
It doesn't appear that I've seen her in the office

## 2018-09-01 NOTE — Telephone Encounter (Signed)
Patient made aware of her cardiac event monitor results with verbalized understanding. There is no follow up appt scheduled with Cardiology. Pt would like to know when or if she needs a f/u appt. Adv the pt that I will fwd the message to Dr.Berry to advise if f/u is needed. Adv her that we will give her a call back with his response.

## 2018-09-05 ENCOUNTER — Other Ambulatory Visit: Payer: Self-pay | Admitting: Family Medicine

## 2018-09-05 DIAGNOSIS — Z1231 Encounter for screening mammogram for malignant neoplasm of breast: Secondary | ICD-10-CM

## 2018-09-14 ENCOUNTER — Other Ambulatory Visit: Payer: Self-pay | Admitting: Family Medicine

## 2018-09-15 ENCOUNTER — Other Ambulatory Visit: Payer: Self-pay

## 2018-09-15 DIAGNOSIS — M5442 Lumbago with sciatica, left side: Secondary | ICD-10-CM

## 2018-09-15 MED ORDER — METFORMIN HCL 500 MG PO TABS: 1000.0000 mg | ORAL_TABLET | Freq: Two times a day (BID) | ORAL | 3 refills | Status: DC

## 2018-09-15 MED ORDER — GABAPENTIN 100 MG PO CAPS: 100.0000 mg | ORAL_CAPSULE | Freq: Three times a day (TID) | ORAL | 3 refills | Status: DC

## 2018-10-08 ENCOUNTER — Other Ambulatory Visit: Payer: Self-pay | Admitting: Family Medicine

## 2018-10-08 DIAGNOSIS — M5442 Lumbago with sciatica, left side: Secondary | ICD-10-CM

## 2018-10-10 ENCOUNTER — Ambulatory Visit: Payer: BLUE CROSS/BLUE SHIELD

## 2018-10-31 ENCOUNTER — Telehealth: Payer: BLUE CROSS/BLUE SHIELD | Admitting: Family Medicine

## 2018-11-16 ENCOUNTER — Ambulatory Visit: Payer: BLUE CROSS/BLUE SHIELD

## 2018-12-21 ENCOUNTER — Ambulatory Visit: Payer: BC Managed Care – PPO | Admitting: Family Medicine

## 2018-12-21 ENCOUNTER — Other Ambulatory Visit: Payer: Self-pay

## 2018-12-21 VITALS — BP 139/70 | HR 63

## 2018-12-21 DIAGNOSIS — N3090 Cystitis, unspecified without hematuria: Secondary | ICD-10-CM | POA: Diagnosis not present

## 2018-12-21 DIAGNOSIS — R8290 Unspecified abnormal findings in urine: Secondary | ICD-10-CM

## 2018-12-21 LAB — POCT UA - MICROSCOPIC ONLY

## 2018-12-21 LAB — POCT URINALYSIS DIP (MANUAL ENTRY)
Bilirubin, UA: NEGATIVE
Glucose, UA: NEGATIVE mg/dL
Ketones, POC UA: NEGATIVE mg/dL
Nitrite, UA: POSITIVE — AB
Protein Ur, POC: NEGATIVE mg/dL
Spec Grav, UA: 1.015 (ref 1.010–1.025)
Urobilinogen, UA: 0.2 E.U./dL
pH, UA: 7 (ref 5.0–8.0)

## 2018-12-21 MED ORDER — SULFAMETHOXAZOLE-TRIMETHOPRIM 800-160 MG PO TABS: 1.0000 | ORAL_TABLET | Freq: Two times a day (BID) | ORAL | 0 refills | Status: DC

## 2018-12-21 NOTE — Progress Notes (Signed)
Subjective: Chief Complaint  Patient presents with  . urine cloudy    x 1 month  . left flank pain    x 1 month    HPI: Candice Warner is a 68 y.o. presenting to clinic today to discuss the following:  Left sided pain with increased urinary frequency Patient states she has been having left sided pain for the last couple of months that seems random. It comes and goes at random and has been responding to Advil 800mg  daily. Heating pads also seem to help some. She describes the pain as "like an ache" and can lead to lower stomach pain. It lasts for about one hour and nothing seems to make it worse. She denies any pain or burning on urination but does endorse urinary frequency and urgency with a more cloudy appearance to her urine. No blood seen.   She denies fever, chills, SOB, abdominal pain, nausea, vomiting, diarrhea, and constipation.  ROS noted in HPI.   Past Medical, Surgical, Social, and Family History Reviewed & Updated per EMR.   Pertinent Historical Findings include:   Social History   Tobacco Use  Smoking Status Never Smoker  Smokeless Tobacco Never Used    Objective: BP 139/70   Pulse 63   SpO2 99%  Vitals and nursing notes reviewed  Physical Exam Gen: Alert and Oriented x 3, NAD HEENT: Normocephalic, atraumatic CV: RRR, no murmurs, normal S1, S2 split Resp: CTAB, no wheezing, rales, or rhonchi, comfortable work of breathing Abd: non-distended, non-tender, soft, +bs in all four quadrants GU: No costovertebral angle tenderness Ext: no clubbing, cyanosis, or edema Skin: warm, dry, intact, no rashes  Results for orders placed or performed in visit on 12/21/18 (from the past 72 hour(s))  POCT urinalysis dipstick     Status: Abnormal   Collection Time: 12/21/18 11:25 AM  Result Value Ref Range   Color, UA yellow yellow   Clarity, UA cloudy (A) clear   Glucose, UA negative negative mg/dL   Bilirubin, UA negative negative   Ketones, POC UA negative  negative mg/dL   Spec Grav, UA 1.015 1.010 - 1.025   Blood, UA trace-intact (A) negative   pH, UA 7.0 5.0 - 8.0   Protein Ur, POC negative negative mg/dL   Urobilinogen, UA 0.2 0.2 or 1.0 E.U./dL   Nitrite, UA Positive (A) Negative   Leukocytes, UA Large (3+) (A) Negative    Assessment/Plan:  Cystitis Symptoms of urinary frequency and urgency with positive leuks and nitrites on U/A make UTI most likely diagnosis. I did consider the left sided pain could be concern for pyelonephritis but as she is still voiding normally with no fever, chills, and normal vitals I believe it is less likely.  - Macrobid 100mg  BID for 7 days - F/u as needed if symptoms do not improve after treatment   PATIENT EDUCATION PROVIDED: See AVS    Diagnosis and plan along with any newly prescribed medication(s) were discussed in detail with this patient today. The patient verbalized understanding and agreed with the plan. Patient advised if symptoms worsen return to clinic or ER.   Orders Placed This Encounter  Procedures  . POCT urinalysis dipstick  . POCT UA - Microscopic Only    Meds ordered this encounter  Medications  . sulfamethoxazole-trimethoprim (BACTRIM DS) 800-160 MG tablet    Sig: Take 1 tablet by mouth 2 (two) times daily.    Dispense:  6 tablet    Refill:  0  Jules Schickim Annabel Gibeau, DO 12/21/2018, 11:35 AM PGY-3 [facility]

## 2018-12-21 NOTE — Patient Instructions (Addendum)
It was great to see you today! Thank you for letting me participate in your care!  Today, we discussed urinary changes and you do have a UTI. I have prescribed a medication called Bactrim. Please take it as prescribed. If your symptoms fail to improve please call me.   For your left sided pain it could be a muscle strain or related to your UTI. Try Capzasin Cream as needed and Advil 400mg  every 8-12hrs as needed for pain.    Be well, Harolyn Rutherford, DO PGY-3, [facility]

## 2018-12-23 DIAGNOSIS — N3090 Cystitis, unspecified without hematuria: Secondary | ICD-10-CM | POA: Insufficient documentation

## 2018-12-23 NOTE — Assessment & Plan Note (Signed)
Symptoms of urinary frequency and urgency with positive leuks and nitrites on U/A make UTI most likely diagnosis. I did consider the left sided pain could be concern for pyelonephritis but as she is still voiding normally with no fever, chills, and normal vitals I believe it is less likely.  - Macrobid 100mg  BID for 7 days - F/u as needed if symptoms do not improve after treatment

## 2019-01-05 ENCOUNTER — Other Ambulatory Visit: Payer: Self-pay

## 2019-01-05 ENCOUNTER — Telehealth (INDEPENDENT_AMBULATORY_CARE_PROVIDER_SITE_OTHER): Payer: BC Managed Care – PPO | Admitting: Family Medicine

## 2019-01-05 DIAGNOSIS — M5442 Lumbago with sciatica, left side: Secondary | ICD-10-CM

## 2019-01-05 DIAGNOSIS — N3090 Cystitis, unspecified without hematuria: Secondary | ICD-10-CM | POA: Diagnosis not present

## 2019-01-05 MED ORDER — GABAPENTIN 100 MG PO CAPS: 100.0000 mg | ORAL_CAPSULE | Freq: Three times a day (TID) | ORAL | 0 refills | Status: DC

## 2019-01-05 MED ORDER — LISINOPRIL 10 MG PO TABS: 10.0000 mg | ORAL_TABLET | Freq: Every day | ORAL | 5 refills | Status: DC

## 2019-01-05 MED ORDER — CEPHALEXIN 250 MG PO CAPS: 250.0000 mg | ORAL_CAPSULE | Freq: Four times a day (QID) | ORAL | 0 refills | Status: AC

## 2019-01-05 NOTE — Progress Notes (Signed)
[place] Telemedicine Visit  Patient consented to have virtual visit. Method of visit: Telephone  Encounter participants: Patient: Candice Warner - located at home Provider: Bonnita Hollow - located at office Others (if applicable): none  Chief Complaint: Cloudy, foul smelling urine  HPI:  Patient complaining of continued urinary frequency and cloudy, foul-smelling urine.  Patient was seen on 7/9 for similar symptoms where UA showed that she had positive nitrates, large leukocytes and was diagnosed with cystitis at that time.  Patient was sent home with a week course of Bactrim, although note does not mention Macrobid as the primary therapy.Patient completed course of antibiotics.  Patient does not endorse any abdominal pain, hematuria, vomiting, fevers.  Have patient come in for testing, says she would prefer to trial an alternative therapy.  ROS: per HPI  Pertinent PMHx: Recently diagnosed with cystitis  Exam:  Respiratory: Able to speak full sentences without issue  Assessment/Plan: Cystitis Due to patient's continuation of symptoms, likely has untreated cystitis.  Patient took Bactrim which does have some known resistance.  Likely needs alternative therapy.  Will empirically treat with Keflex for 7 days.  If not improved, will likely need to have patient come in with urine cultured to them determine antibiotic sensitivities.   Time spent during visit with patient: 5 minutes

## 2019-01-16 ENCOUNTER — Ambulatory Visit
Admission: RE | Admit: 2019-01-16 | Discharge: 2019-01-16 | Disposition: A | Discharge status: Home / Self Care | Payer: BC Managed Care – PPO | Source: Ambulatory Visit | Attending: Family Medicine | Admitting: Family Medicine

## 2019-01-16 ENCOUNTER — Other Ambulatory Visit: Payer: Self-pay

## 2019-01-16 DIAGNOSIS — Z1231 Encounter for screening mammogram for malignant neoplasm of breast: Secondary | ICD-10-CM

## 2019-03-09 ENCOUNTER — Other Ambulatory Visit: Payer: Self-pay

## 2019-03-09 ENCOUNTER — Encounter: Payer: Self-pay | Admitting: Family Medicine

## 2019-03-09 ENCOUNTER — Ambulatory Visit: Payer: BC Managed Care – PPO | Admitting: Family Medicine

## 2019-03-09 DIAGNOSIS — Z23 Encounter for immunization: Secondary | ICD-10-CM

## 2019-03-09 DIAGNOSIS — M1811 Unilateral primary osteoarthritis of first carpometacarpal joint, right hand: Secondary | ICD-10-CM

## 2019-03-09 NOTE — Patient Instructions (Signed)
It was great to see you today! Thank you for letting me participate in your care!  Today, we discussed your right thumb pain. I believe you may have an osteoarthritis in your thumb. Continue to use ice and buy the over the counter cream I have pictured below.      Continue taking Motrin OTC and Tylenol as needed. Please return to the clinic if it worsens or does not get better.  Be well, Harolyn Rutherford, DO PGY-3, [facility]

## 2019-03-09 NOTE — Progress Notes (Signed)
     Subjective: Chief Complaint  Patient presents with  . check hand  . Flu Vaccine    HPI: Candice Warner is a 68 y.o. presenting to clinic today to discuss the following:  Right Thumb Pain Patient is having pain when moving her right thumb. Better with rest, worse with movement and use. Pain is non-radiating, dull, achy, and intermittent. It is located at the MTP joint of her right thumb. No associated rash, no fall or trauma to the area. She has not had this before. It started several weeks ago. She has not noticed any swelling to the area or discoloration.     ROS noted in HPI.    Social History   Tobacco Use  Smoking Status Never Smoker  Smokeless Tobacco Never Used    Objective: BP 128/68   Pulse 65   SpO2 98%  Vitals and nursing notes reviewed  Physical Exam Gen: Alert and Oriented x 3, NAD HEENT: Normocephalic, atraumatic, PERRLA, EOMI, TM visible with good light reflex, non-swollen, non-erythematous turbinates, non-erythematous pharyngeal mucosa, no exudates Neck: trachea midline, no thyroidmegaly, no LAD CV: RRR, no murmurs, normal S1, S2 split Resp: CTAB, no wheezing, rales, or rhonchi, comfortable work of breathing Abd: non-distended, non-tender, soft, +bs in all four quadrants MSK: Wrist and Hand, Right: TTP noted at the 1st MTP joint. Inspection yielded no erythema, ecchymosis, bony deformity, or swelling. ROM full with good flexion and extension and ulnar/radial deviation that is symmetrical with opposite wrist. ROM with thumb is normal. Palpation is normal over metacarpals with above mentioned TTP at 1st MTP, scaphoid, lunate, and TFCC; tendons without tenderness/swelling. Strength 5/5 in all directions without pain. Ext: no clubbing, cyanosis, or edema  Assessment/Plan:  Osteoarthritis Most likely diagnosis given symptoms and onset. However, did consider scaphoid fracture but no tenderness at the anatomical snuff box, no wrist pain or swelling.  Considered RA but symptoms more consistent with OA. Also, no other joint involvement. - OTC Ibuprofen as needed - If worsens she will follow up with me for further management.   PATIENT EDUCATION PROVIDED: See AVS    Diagnosis and plan along with any newly prescribed medication(s) were discussed in detail with this patient today. The patient verbalized understanding and agreed with the plan. Patient advised if symptoms worsen return to clinic or ER.    Orders Placed This Encounter  Procedures  . Flu Vaccine QUAD 36+ mos IM    No orders of the defined types were placed in this encounter.    Harolyn Rutherford, DO 03/09/2019, 3:00 PM PGY-3 [place]

## 2019-03-13 ENCOUNTER — Other Ambulatory Visit: Payer: Self-pay | Admitting: Family Medicine

## 2019-03-15 DIAGNOSIS — M1990 Unspecified osteoarthritis, unspecified site: Secondary | ICD-10-CM | POA: Insufficient documentation

## 2019-03-15 NOTE — Assessment & Plan Note (Signed)
Most likely diagnosis given symptoms and onset. However, did consider scaphoid fracture but no tenderness at the anatomical snuff box, no wrist pain or swelling. Considered RA but symptoms more consistent with OA. Also, no other joint involvement. - OTC Ibuprofen as needed - If worsens she will follow up with me for further management.

## 2019-04-02 ENCOUNTER — Other Ambulatory Visit: Payer: Self-pay | Admitting: Family Medicine

## 2019-04-02 DIAGNOSIS — M5442 Lumbago with sciatica, left side: Secondary | ICD-10-CM

## 2019-05-05 ENCOUNTER — Other Ambulatory Visit: Payer: Self-pay

## 2019-05-05 MED ORDER — SERTRALINE HCL 100 MG PO TABS: 100.0000 mg | ORAL_TABLET | Freq: Every day | ORAL | 3 refills | Status: DC

## 2019-05-30 ENCOUNTER — Other Ambulatory Visit: Payer: Self-pay | Admitting: *Deleted

## 2019-05-30 DIAGNOSIS — E119 Type 2 diabetes mellitus without complications: Secondary | ICD-10-CM

## 2019-05-30 MED ORDER — ATORVASTATIN CALCIUM 40 MG PO TABS: 40.0000 mg | ORAL_TABLET | Freq: Every day | ORAL | 3 refills | Status: DC

## 2019-06-26 ENCOUNTER — Ambulatory Visit: Payer: Medicare PPO | Admitting: Family Medicine

## 2019-06-26 ENCOUNTER — Other Ambulatory Visit: Payer: Self-pay

## 2019-06-26 ENCOUNTER — Other Ambulatory Visit: Payer: Self-pay | Admitting: Family Medicine

## 2019-06-26 ENCOUNTER — Telehealth: Payer: Self-pay

## 2019-06-26 VITALS — BP 140/65 | HR 64 | Wt 199.6 lb

## 2019-06-26 DIAGNOSIS — E119 Type 2 diabetes mellitus without complications: Secondary | ICD-10-CM

## 2019-06-26 DIAGNOSIS — R921 Mammographic calcification found on diagnostic imaging of breast: Secondary | ICD-10-CM | POA: Insufficient documentation

## 2019-06-26 DIAGNOSIS — N6310 Unspecified lump in the right breast, unspecified quadrant: Secondary | ICD-10-CM | POA: Diagnosis not present

## 2019-06-26 DIAGNOSIS — M25551 Pain in right hip: Secondary | ICD-10-CM | POA: Diagnosis not present

## 2019-06-26 LAB — POCT GLYCOSYLATED HEMOGLOBIN (HGB A1C): HbA1c, POC (controlled diabetic range): 7.8 % — AB (ref 0.0–7.0)

## 2019-06-26 MED ORDER — METHYLPREDNISOLONE ACETATE 40 MG/ML IJ SUSP
40.0000 mg | Freq: Once | INTRAMUSCULAR | Status: AC
  Administered 2019-06-26: 40 mg via INTRAMUSCULAR

## 2019-06-26 NOTE — Patient Instructions (Signed)
Hip Bursitis  Hip bursitis is swelling of a fluid-filled sac (bursa) in your hip joint. This swelling (inflammation) can be painful. This condition may come and go over time. What are the causes?  Injury to the hip.  Overuse of the muscles that surround the hip joint.  An earlier injury or surgery of the hip.  Arthritis or gout.  Diabetes.  Thyroid disease.  Infection.  In some cases, the cause may not be known. What are the signs or symptoms?  Mild or moderate pain in the hip area. Pain may get worse with movement.  Tenderness and swelling of the hip, especially on the outer side of the hip.  In rare cases, the bursa may become infected. This may cause: ? A fever. ? Warmth and redness in the area. Symptoms may come and go. How is this treated? This condition is treated by resting, icing, applying pressure (compression), and raising (elevating) the injured area. You may hear this called the RICE treatment. Treatment may also include:  Using crutches.  Draining fluid out of the bursa to help relieve swelling.  Giving a shot of (injecting) medicine that helps to reduce swelling (cortisone).  Other medicines if the bursa is infected. Follow these instructions at home: Managing pain, stiffness, and swelling   If told, put ice on the painful area. ? Put ice in a plastic bag. ? Place a towel between your skin and the bag. ? Leave the ice on for 20 minutes, 2-3 times a day. ? Raise (elevate) your hip above the level of your heart as much as you can without pain. To do this, try putting a pillow under your hips while you lie down. Stop if this causes pain. Activity  Return to your normal activities as told by your doctor. Ask your doctor what activities are safe for you.  Rest and protect your hip as much as you can until you feel better. General instructions  Take over-the-counter and prescription medicines only as told by your doctor.  Wear wraps that put pressure  on your hip (compression wraps) only as told by your doctor.  Do not use your hip to support your body weight until your doctor says that you can.  Use crutches as told by your doctor.  Gently rub and stretch your injured area as often as is comfortable.  Keep all follow-up visits as told by your doctor. This is important. How is this prevented?  Exercise regularly, as told by your doctor.  Warm up and stretch before being active.  Cool down and stretch after being active.  Avoid activities that bother your hip or cause pain.  Avoid sitting down for long periods at a time. Contact a doctor if:  You have a fever.  You get new symptoms.  You have trouble walking.  You have trouble doing everyday activities.  You have pain that gets worse.  You have pain that does not get better with medicine.  You get red skin on your hip area.  You get a feeling of warmth in your hip area. Get help right away if:  You cannot move your hip.  You have very bad pain. Summary  Hip bursitis is swelling of a fluid-filled sac (bursa) in your hip.  Hip bursitis can be painful.  Symptoms often come and go over time.  This condition is treated with rest, ice, compression, elevation, and medicines. This information is not intended to replace advice given to you by your health care provider.   Make sure you discuss any questions you have with your health care provider. Document Revised: 02/07/2018 Document Reviewed: 02/07/2018 Elsevier Patient Education  2020 Elsevier Inc.  

## 2019-06-26 NOTE — Progress Notes (Signed)
Subjective: Chief Complaint  Patient presents with  . right breast knot  . left hip pain    x 2 months     HPI: Candice Warner is a 69 y.o. presenting to clinic today to discuss the following:  Lump in Right Breast Patient felt a lump in her right breast, no pain or skin changes. No swelling or nipple discharge. She is not having fever,chills, or weight loss. Her last mammogram was 01/16/2019 and was normal. Patient is requesting imaging and referral which is reasonable. I will send her for a diagnostic mammogram.  Right Hip Pain Patient states she has been having intermittent hip pain for a long time which began to get worse over the past few weeks. Pain is located on the outside of her hip and thigh radiating down the front of her leg and stopping at her shin. She states it got to a 10/10 and was tender to touch but she started using OTC "red alcohol" and some patches with Alleve Arthritis pain and it has provided some relief. The pain described as throbbing pain with no numbness, tingling, or loss of sensation. No burning. No pain in her lower back or buttocks.     ROS noted in HPI.    Social History   Tobacco Use  Smoking Status Never Smoker  Smokeless Tobacco Never Used   Objective: BP 140/65   Pulse 64   Wt 199 lb 9.6 oz (90.5 kg)   SpO2 99%   BMI 31.26 kg/m  Vitals and nursing notes reviewed  Physical Exam Hip, Right: TTP noted at greater trochanter. No obvious rash, erythema, ecchymosis, or edema. ROM full in all directions (IR: 80/ ER: 80/Flex: 120/Ext: 100/Abd: 45/Add: 45); Strength 5/5 in IR/ER/Flex/Ext/Abd/Add. Pelvic alignment unremarkable to inspection and palpation. Standing hip rotation and gait without unsteadiness. Greater trochanter with tenderness to palpation. No tenderness over piriformis. No SI joint tenderness and normal minimal SI movement. Hip, Left:  No obvious rash, erythema, ecchymosis, or edema. ROM full in all directions (IR: 80/ ER:  80/Flex: 120/Ext: 100/Abd: 45/Add: 45); Strength 5/5 in IR/ER/Flex/Ext/Abd/Add. Pelvic alignment unremarkable to inspection and palpation. Standing hip rotation and gait without trendelenburg / unsteadiness. Greater trochanter without tenderness to palpation. No tenderness over piriformis. No SI joint tenderness and normal minimal SI movement.  Procedure A steroid injection was performed at the right great trochanteric bursa using 1% plain Lidocaine 96ml and 40mg  of Depomedrol using a lateral approach. Only about 2.97ml was able to successfully be injected into the bursa site. This was well tolerated.   Assessment/Plan:  Right hip pain Right hip pain that has gotten acutely worse and different from previously suspected hip OA. Pain and tenderness on palpation at the greater trochanter. Suspect bursitis. No radicular symptoms so sciatica or low back nerve impingement unlikely. - Depomedrol injection at greater trochanter performed today. - If no improvement or worsening consider ordering imaging.  Unspecified lump in the right breast, unspecified quadrant Patient found non-painful lump in right breast. No B symptoms, no skin changes. - Diagnositic ultrasound and mammogram. Referral made to GI Breast center.   PATIENT EDUCATION PROVIDED: See AVS    Diagnosis and plan along with any newly prescribed medication(s) were discussed in detail with this patient today. The patient verbalized understanding and agreed with the plan. Patient advised if symptoms worsen return to clinic or ER.    Orders Placed This Encounter  Procedures  . MM Digital Diagnostic Unilat R  Standing Status:   Future    Standing Expiration Date:   09/24/2019    Order Specific Question:   Reason for Exam (SYMPTOM  OR DIAGNOSIS REQUIRED)    Answer:   lump in right breast    Order Specific Question:   Preferred imaging location?    Answer:   [facility]  . HgB A1c    Meds ordered this encounter  Medications    . methylPREDNISolone acetate (DEPO-MEDROL) injection 40 mg     Harolyn Rutherford, DO 06/26/2019, 10:08 AM PGY-3 [place]

## 2019-06-26 NOTE — Assessment & Plan Note (Signed)
Patient found non-painful lump in right breast. No B symptoms, no skin changes. - Diagnositic ultrasound and mammogram. Referral made to GI Breast center.

## 2019-06-26 NOTE — Telephone Encounter (Signed)
Called patient and informed her of diagnostic mammogram appointment 07/07/2019 at [facility] [address] [place] [phone]. Arrival time is 0800. [phone].  Patient verbalized understanding.  Glennie Hawk, CMA

## 2019-06-26 NOTE — Assessment & Plan Note (Signed)
Right hip pain that has gotten acutely worse and different from previously suspected hip OA. Pain and tenderness on palpation at the greater trochanter. Suspect bursitis. No radicular symptoms so sciatica or low back nerve impingement unlikely. - Depomedrol injection at greater trochanter performed today. - If no improvement or worsening consider ordering imaging.

## 2019-07-07 ENCOUNTER — Other Ambulatory Visit: Payer: Self-pay

## 2019-07-07 ENCOUNTER — Ambulatory Visit
Admission: RE | Admit: 2019-07-07 | Discharge: 2019-07-07 | Disposition: A | Discharge status: Home / Self Care | Payer: Medicare PPO | Source: Ambulatory Visit | Attending: Family Medicine | Admitting: Family Medicine

## 2019-07-07 DIAGNOSIS — N6312 Unspecified lump in the right breast, upper inner quadrant: Secondary | ICD-10-CM | POA: Diagnosis not present

## 2019-07-07 DIAGNOSIS — R928 Other abnormal and inconclusive findings on diagnostic imaging of breast: Secondary | ICD-10-CM | POA: Diagnosis not present

## 2019-07-07 DIAGNOSIS — N6310 Unspecified lump in the right breast, unspecified quadrant: Secondary | ICD-10-CM

## 2019-07-07 DIAGNOSIS — N6001 Solitary cyst of right breast: Secondary | ICD-10-CM | POA: Diagnosis not present

## 2019-08-17 ENCOUNTER — Ambulatory Visit: Payer: Medicare PPO | Attending: Internal Medicine

## 2019-08-17 DIAGNOSIS — Z23 Encounter for immunization: Secondary | ICD-10-CM | POA: Insufficient documentation

## 2019-08-17 NOTE — Progress Notes (Signed)
   Covid-19 Vaccination Clinic  Name:  JUDITH DEMPS    MRN: [id number] DOB: 1950/12/28  08/17/2019  Ms. Allende was observed post Covid-19 immunization for 15 minutes without incident. She was provided with Vaccine Information Sheet and instruction to access the V-Safe system.   Ms. Redondo was instructed to call 911 with any severe reactions post vaccine: Marland Kitchen Difficulty breathing  . Swelling of face and throat  . A fast heartbeat  . A bad rash all over body  . Dizziness and weakness   Immunizations Administered    Name Date Dose VIS Date Route   Pfizer COVID-19 Vaccine 08/17/2019  4:39 PM 0.3 mL 05/26/2019 Intramuscular   Manufacturer: [employer], [employer]   Lot: [id number]   NDC: [address]-2

## 2019-09-08 ENCOUNTER — Other Ambulatory Visit: Payer: Self-pay | Admitting: Family Medicine

## 2019-09-08 DIAGNOSIS — H109 Unspecified conjunctivitis: Secondary | ICD-10-CM

## 2019-09-13 ENCOUNTER — Ambulatory Visit: Payer: Medicare PPO | Attending: Internal Medicine

## 2019-09-13 DIAGNOSIS — Z23 Encounter for immunization: Secondary | ICD-10-CM

## 2019-09-13 NOTE — Progress Notes (Signed)
   Covid-19 Vaccination Clinic  Name:  LEANORA MURIN    MRN: [id number] DOB: 04/21/1951  09/13/2019  Ms. Salsbury was observed post Covid-19 immunization for 15 minutes without incident. She was provided with Vaccine Information Sheet and instruction to access the V-Safe system.   Ms. Speth was instructed to call 911 with any severe reactions post vaccine: Marland Kitchen Difficulty breathing  . Swelling of face and throat  . A fast heartbeat  . A bad rash all over body  . Dizziness and weakness   Immunizations Administered    Name Date Dose VIS Date Route   Pfizer COVID-19 Vaccine 09/13/2019  4:45 PM 0.3 mL 05/26/2019 Intramuscular   Manufacturer: [employer], [employer]   Lot: [id number]   NDC: [address]-0

## 2019-11-14 ENCOUNTER — Other Ambulatory Visit: Payer: Self-pay | Admitting: Family Medicine

## 2019-12-21 ENCOUNTER — Other Ambulatory Visit: Payer: Self-pay | Admitting: Family Medicine

## 2019-12-21 DIAGNOSIS — Z1231 Encounter for screening mammogram for malignant neoplasm of breast: Secondary | ICD-10-CM

## 2020-01-09 ENCOUNTER — Encounter: Payer: Medicare PPO | Admitting: Family Medicine

## 2020-01-15 NOTE — Progress Notes (Deleted)
    SUBJECTIVE:   CHIEF COMPLAINT / HPI:   [name] is a 69 year old female presenting today for the below issues.  HDL 57, LDL 32, triglycerides elevated at 160  PERTINENT  PMH / PSH: Let's turn on the microphone in for a sentence or 2.  OBJECTIVE:   There were no vitals taken for this visit.  ***  ASSESSMENT/PLAN:   No problem-specific Assessment & Plan notes found for this encounter.     Candice Jumbo, DO [facility]

## 2020-01-16 ENCOUNTER — Ambulatory Visit: Payer: Medicare Other

## 2020-01-16 ENCOUNTER — Ambulatory Visit (INDEPENDENT_AMBULATORY_CARE_PROVIDER_SITE_OTHER): Payer: Medicare Other | Admitting: Family Medicine

## 2020-01-16 ENCOUNTER — Other Ambulatory Visit: Payer: Self-pay

## 2020-01-16 VITALS — BP 122/80 | HR 83 | Ht 66.5 in | Wt 191.2 lb

## 2020-01-16 DIAGNOSIS — M5442 Lumbago with sciatica, left side: Secondary | ICD-10-CM | POA: Diagnosis not present

## 2020-01-16 DIAGNOSIS — Z0000 Encounter for general adult medical examination without abnormal findings: Secondary | ICD-10-CM | POA: Diagnosis not present

## 2020-01-16 DIAGNOSIS — E119 Type 2 diabetes mellitus without complications: Secondary | ICD-10-CM

## 2020-01-16 DIAGNOSIS — I10 Essential (primary) hypertension: Secondary | ICD-10-CM | POA: Diagnosis not present

## 2020-01-16 DIAGNOSIS — E039 Hypothyroidism, unspecified: Secondary | ICD-10-CM

## 2020-01-16 DIAGNOSIS — F339 Major depressive disorder, recurrent, unspecified: Secondary | ICD-10-CM

## 2020-01-16 DIAGNOSIS — H109 Unspecified conjunctivitis: Secondary | ICD-10-CM

## 2020-01-16 LAB — POCT GLYCOSYLATED HEMOGLOBIN (HGB A1C): HbA1c, POC (controlled diabetic range): 8.7 % — AB (ref 0.0–7.0)

## 2020-01-16 MED ORDER — LORATADINE 10 MG PO TABS: 10.0000 mg | ORAL_TABLET | Freq: Every day | ORAL | 0 refills | Status: DC

## 2020-01-16 MED ORDER — LISINOPRIL 10 MG PO TABS: 10.0000 mg | ORAL_TABLET | Freq: Every day | ORAL | 0 refills | Status: DC

## 2020-01-16 MED ORDER — SERTRALINE HCL 100 MG PO TABS: 50.0000 mg | ORAL_TABLET | Freq: Every day | ORAL | 0 refills | Status: DC

## 2020-01-16 MED ORDER — GABAPENTIN 100 MG PO CAPS: ORAL_CAPSULE | ORAL | 0 refills | Status: DC

## 2020-01-16 MED ORDER — LEVOTHYROXINE SODIUM 50 MCG PO TABS: 50.0000 ug | ORAL_TABLET | Freq: Every day | ORAL | 0 refills | Status: DC

## 2020-01-16 MED ORDER — METFORMIN HCL 500 MG PO TABS: 1000.0000 mg | ORAL_TABLET | Freq: Two times a day (BID) | ORAL | 0 refills | Status: DC

## 2020-01-16 MED ORDER — ATORVASTATIN CALCIUM 40 MG PO TABS: 40.0000 mg | ORAL_TABLET | Freq: Every day | ORAL | 0 refills | Status: DC

## 2020-01-16 NOTE — Progress Notes (Deleted)
    SUBJECTIVE:         SUBJECTIVE:   Chief compliant/HPI: annual examination  Kanitra J Lesmeister is a 69 y.o. who presents today for an annual exam.   Hypertension: - Medications: Lisinopril 10 mg daily - Compliance: *** - Checking BP at home: *** - Denies any SOB, CP, vision changes, LE edema, medication SEs, or symptoms of hypotension - Diet: *** - Exercise: ***  Diabetes Current Regimen: Metformin 1000 mg twice daily CBGs: ***  Last A1c: 7.8 on 06/26/2019 Denies polyuria, polydipsia, hypoglycemia *** Last Eye Exam: 12/28/2017 Statin: Lipitor 40 mg daily  ACE/ARB: Lisinopril 10 mg daily  Hypothyroidism  History tabs reviewed and updated ***.   Review of systems form reviewed and notable for ***.   OBJECTIVE:   BP 122/80   Pulse 83   Ht 5' 6.5" (1.689 m)   Wt 191 lb 3.2 oz (86.7 kg)   SpO2 96%   BMI 30.40 kg/m    General: Appears well, no acute distress. Age appropriate. Cardiac: RRR, normal heart sounds, no murmurs Respiratory: CTAB, normal effort Abdomen: soft, nontender, nondistended Extremities: No edema or cyanosis. Skin: Warm and dry, no rashes noted Neuro: alert and oriented, no focal deficits Psych: normal affect   ASSESSMENT/PLAN:   No problem-specific Assessment & Plan notes found for this encounter.    Annual Examination  See AVS for age appropriate recommendations  PHQ score ***, reviewed and discussed.  BP reviewed and at goal ***.  Asked about intimate partner violence and resources given as appropriate  Advance directives discussion ***  Considered the following items based upon USPSTF recommendations: Diabetes screening: {discussed/ordered:14545} Screening for elevated cholesterol: {discussed/ordered:14545} HIV testing: {discussed/ordered:14545} Hepatitis C: {discussed/ordered:14545} Hepatitis B: {discussed/ordered:14545} Syphilis if at high risk: {discussed/ordered:14545} GC/CT {GC/CT screening :23818} Osteoporosis screening  considered based upon risk of fracture from FRAX calculator. Major osteoporotic fracture risk is ***%. DEXA {ordered not order:23822}.  Reviewed risk factors for latent tuberculosis and {not indicated/requested/declined:14582}   Discussed family history, BRCA testing {not indicated/requested/declined:14582}. Tool used to risk stratify was ***.  Cervical cancer screening: {PAPTYPE:23819} Breast cancer screening: {mammoscreen:23820} Colorectal cancer screening: {crcscreen:23821::"discussed, colonoscopy ordered"} Lung cancer screening: {discussed/declined/written info:19698}. See documentation below regarding indications/risks/benefits.  Vaccinations ***.   Follow up in 1 *** year or sooner if indicated.    Simone Autry-Lott, DO [facility]   

## 2020-01-16 NOTE — Patient Instructions (Addendum)
It was very nice to meet you today. Please enjoy the rest of your week. Today you were seen for your annual physical. Follow up in 1 year for your next physical and in 6 months for diabetes checkup. You can increase your zoloft to 100 mg in 1 week if 50 mg is not helping your current mood. To help with sleep you can change how you can your gabapentin. Take 300mg  of gabapentin at night. Lets try this before adding another medication. Also attempt to adapt good sleep hygiene.  Please call the clinic at [phone] if your symptoms worsen or you have any concerns. It was our pleasure to serve you.   Insomnia Insomnia is a sleep disorder that makes it difficult to fall asleep or stay asleep. Insomnia can cause fatigue, low energy, difficulty concentrating, mood swings, and poor performance at work or school. There are three different ways to classify insomnia:  Difficulty falling asleep.  Difficulty staying asleep.  Waking up too early in the morning. Any type of insomnia can be long-term (chronic) or short-term (acute). Both are common. Short-term insomnia usually lasts for three months or less. Chronic insomnia occurs at least three times a week for longer than three months. What are the causes? Insomnia may be caused by another condition, situation, or substance, such as:  Anxiety.  Certain medicines.  Gastroesophageal reflux disease (GERD) or other gastrointestinal conditions.  Asthma or other breathing conditions.  Restless legs syndrome, sleep apnea, or other sleep disorders.  Chronic pain.  Menopause.  Stroke.  Abuse of alcohol, tobacco, or illegal drugs.  Mental health conditions, such as depression.  Caffeine.  Neurological disorders, such as Alzheimer's disease.  An overactive thyroid (hyperthyroidism). Sometimes, the cause of insomnia may not be known. What increases the risk? Risk factors for insomnia include:  Gender. Women are affected more often than  men.  Age. Insomnia is more common as you get older.  Stress.  Lack of exercise.  Irregular work schedule or working night shifts.  Traveling between different time zones.  Certain medical and mental health conditions. What are the signs or symptoms? If you have insomnia, the main symptom is having trouble falling asleep or having trouble staying asleep. This may lead to other symptoms, such as:  Feeling fatigued or having low energy.  Feeling nervous about going to sleep.  Not feeling rested in the morning.  Having trouble concentrating.  Feeling irritable, anxious, or depressed. How is this diagnosed? This condition may be diagnosed based on:  Your symptoms and medical history. Your health care provider may ask about: ? Your sleep habits. ? Any medical conditions you have. ? Your mental health.  A physical exam. How is this treated? Treatment for insomnia depends on the cause. Treatment may focus on treating an underlying condition that is causing insomnia. Treatment may also include:  Medicines to help you sleep.  Counseling or therapy.  Lifestyle adjustments to help you sleep better. Follow these instructions at home: Eating and drinking   Limit or avoid alcohol, caffeinated beverages, and cigarettes, especially close to bedtime. These can disrupt your sleep.  Do not eat a large meal or eat spicy foods right before bedtime. This can lead to digestive discomfort that can make it hard for you to sleep. Sleep habits   Keep a sleep diary to help you and your health care provider figure out what could be causing your insomnia. Write down: ? When you sleep. ? When you wake up during the night. ?  How well you sleep. ? How rested you feel the next day. ? Any side effects of medicines you are taking. ? What you eat and drink.  Make your bedroom a dark, comfortable place where it is easy to fall asleep. ? Put up shades or blackout curtains to block light from  outside. ? Use a white noise machine to block noise. ? Keep the temperature cool.  Limit screen use before bedtime. This includes: ? Watching TV. ? Using your smartphone, tablet, or computer.  Stick to a routine that includes going to bed and waking up at the same times every day and night. This can help you fall asleep faster. Consider making a quiet activity, such as reading, part of your nighttime routine.  Try to avoid taking naps during the day so that you sleep better at night.  Get out of bed if you are still awake after 15 minutes of trying to sleep. Keep the lights down, but try reading or doing a quiet activity. When you feel sleepy, go back to bed. General instructions  Take over-the-counter and prescription medicines only as told by your health care provider.  Exercise regularly, as told by your health care provider. Avoid exercise starting several hours before bedtime.  Use relaxation techniques to manage stress. Ask your health care provider to suggest some techniques that may work well for you. These may include: ? Breathing exercises. ? Routines to release muscle tension. ? Visualizing peaceful scenes.  Make sure that you drive carefully. Avoid driving if you feel very sleepy.  Keep all follow-up visits as told by your health care provider. This is important. Contact a health care provider if:  You are tired throughout the day.  You have trouble in your daily routine due to sleepiness.  You continue to have sleep problems, or your sleep problems get worse. Get help right away if:  You have serious thoughts about hurting yourself or someone else. If you ever feel like you may hurt yourself or others, or have thoughts about taking your own life, get help right away. You can go to your nearest emergency department or call:  Your local emergency services (911 in the [place]  A suicide crisis helpline, such as the National Suicide Prevention Lifeline at  236-563-6102. This is open 24 hours a day. Summary  Insomnia is a sleep disorder that makes it difficult to fall asleep or stay asleep.  Insomnia can be long-term (chronic) or short-term (acute).  Treatment for insomnia depends on the cause. Treatment may focus on treating an underlying condition that is causing insomnia.  Keep a sleep diary to help you and your health care provider figure out what could be causing your insomnia. This information is not intended to replace advice given to you by your health care provider. Make sure you discuss any questions you have with your health care provider. Document Revised: 05/14/2017 Document Reviewed: 03/11/2017 Elsevier Patient Education  2020 [employer].

## 2020-01-16 NOTE — Assessment & Plan Note (Signed)
Well-controlled.  Continue current regimen. 

## 2020-01-16 NOTE — Progress Notes (Signed)
    SUBJECTIVE:   HPI:   T2DM- Patient monitors blood sugar regularly at home. Ranges from 180-190 fasting.   Hypothyroidism-Doing well on Synthroid 50 mcg.  Abdominal pain  Constipation--Patient reports chronic LLQ abdominal pain. She often feels like she has to go to the bathroom, but is unable to stool. Orange juice helps stimulate bowel movements. Patient reports stooling about three times a week. Patient's pain is alleviated by stooling and Advil, and has improved with increased water intake (patient endorses previous longstanding poor water intake). She denies previous trial of Miralax or other laxatives. No pain with urination. No blood in stool.   Insomnia-Patient reports difficulty sleeping since start of COVID. She gets about three hours of sleep during the daytime, and one hour during nighttime. She stays up to early morning hours doing puzzles. Her energy is low following. Patient has tried melatonin in the past, but it made her feel dizzy. Additionally, she denies screen time use in hours preceding bedtime.    Mood-Patient reports feeling angry about things, and depressed mood. Reports feeling of stress due to increased responsibilities in light of a friend not working. Additionally, patient has not been working for some time and feels like there is little to do. She does puzzles for fun, but nothing else. She would like to restart zoloft (previously on 100 mg), which she had stopped taking about a month ago due to running out. PHQ-9 recorded at 14 today. Denies suicidal ideation.    Health maintenance-Mammogram scheduled for Aug 4, Opthalmology exam on Aug 13. Patient reports history of negative pap smears.    Would like medications refilled  Needs physical to work at daycare    PERTINENT  PMH / PSH: HTN, Hypothyroid, MDD, HLD, Insomnia  OBJECTIVE:   BP 122/80   Pulse 83   Ht 5' 6.5" (1.689 m)   Wt 191 lb 3.2 oz (86.7 kg)   SpO2 96%   BMI 30.40 kg/m    General:  Well appearing, no acute distress. Pleasant and cooperative.  Neck: Supple, with normal range of motion CV: Regular rate and rhythm. No murmur Pulmonary: Normal work of breathing. Clear to auscultation bilaterally Abdominal: Soft, non-tender, no organomegaly Extremities: Appropriate sensation and range of motion in feet. No erythema, blisters or ulcers. Hallux toenail is absent. Hyperpigmented macule on sole of right foot, and heel of left foot; with regular contours.   ASSESSMENT/PLAN:  Health maintenance Diabetic foot exam  A1C Lipid Panel TSH   Major depressive disorder, recurrent episode (HCC) Worsening. No suicidal ideation. Patient has been without Zoloft for a month (ran out). She had previously been on Zoloft 100 mg. Re-started Zoloft at this visit at 50 mg, with plans to titrate up to 100 mg in one week if needed.   HYPERTENSION, BENIGN SYSTEMIC Well controlled. Continue current regimen.  Insomnia Patient instructed on habits to improve sleep. Additionally advised her to try taking 300mg  of her Gabapentin at night. Will consider addition of another medication if no improvement.   F/u in 6 months for Diabetes checkup  , Medical Student [facility]

## 2020-01-16 NOTE — Assessment & Plan Note (Signed)
Patient instructed on habits to improve sleep. Additionally advised her to try taking 300mg  of her Gabapentin at night. Will consider addition of another medication if no improvement.

## 2020-01-16 NOTE — Assessment & Plan Note (Signed)
Worsening. No suicidal ideation. Patient has been without Zoloft for a month (ran out). She had previously been on Zoloft 100 mg. Re-started Zoloft at this visit at 50 mg, with plans to titrate up to 100 mg in one week if needed.

## 2020-01-17 ENCOUNTER — Encounter: Payer: Self-pay | Admitting: Family Medicine

## 2020-01-17 ENCOUNTER — Ambulatory Visit
Admission: RE | Admit: 2020-01-17 | Discharge: 2020-01-17 | Disposition: A | Discharge status: Home / Self Care | Payer: Medicare PPO | Source: Ambulatory Visit | Attending: *Deleted | Admitting: *Deleted

## 2020-01-17 DIAGNOSIS — Z1231 Encounter for screening mammogram for malignant neoplasm of breast: Secondary | ICD-10-CM

## 2020-01-17 LAB — LIPID PANEL
Chol/HDL Ratio: 2.3 ratio (ref 0.0–4.4)
Cholesterol, Total: 96 mg/dL — ABNORMAL LOW (ref 100–199)
HDL: 42 mg/dL (ref >39)
LDL Chol Calc (NIH): 27 mg/dL (ref 0–99)
Triglycerides: 160 mg/dL — ABNORMAL HIGH (ref 0–149)
VLDL Cholesterol Cal: 27 mg/dL (ref 5–40)

## 2020-01-17 LAB — TSH: TSH: 3.82 u[IU]/mL (ref 0.450–4.500)

## 2020-01-17 NOTE — Progress Notes (Signed)
Please see the medical student's note and my attached attestation below. Thank you  Lavonda Jumbo, DO 01/17/2020, 2:23 PM PGY-2, [facility]

## 2020-01-19 ENCOUNTER — Encounter: Payer: Self-pay | Admitting: Family Medicine

## 2020-01-21 IMAGING — DX DG HIP (WITH OR WITHOUT PELVIS) 2-3V*R*
3 series · 3 of 3 positions shown · non-contrast
Comparison: Pelvis and left hip films of 08/27/2014

CLINICAL DATA: Right hip pain for several years, worse after recent
fall

EXAM:
DG HIP (WITH OR WITHOUT PELVIS) 2-3V RIGHT

[pelvis ap]
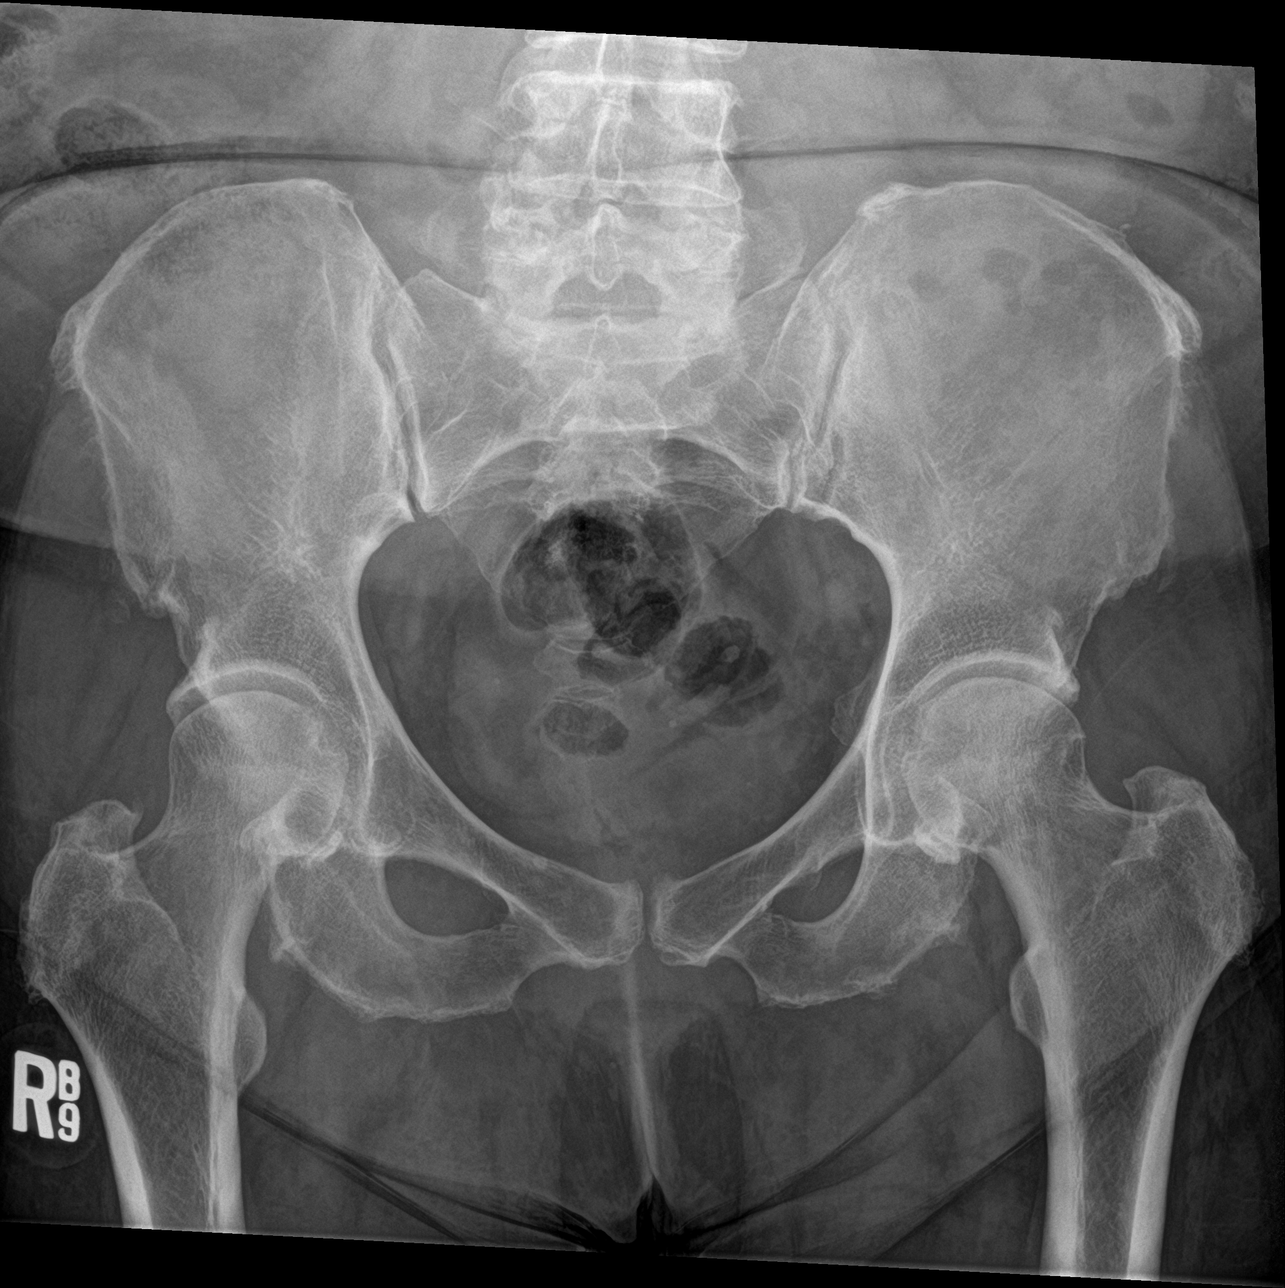

[hip ap]
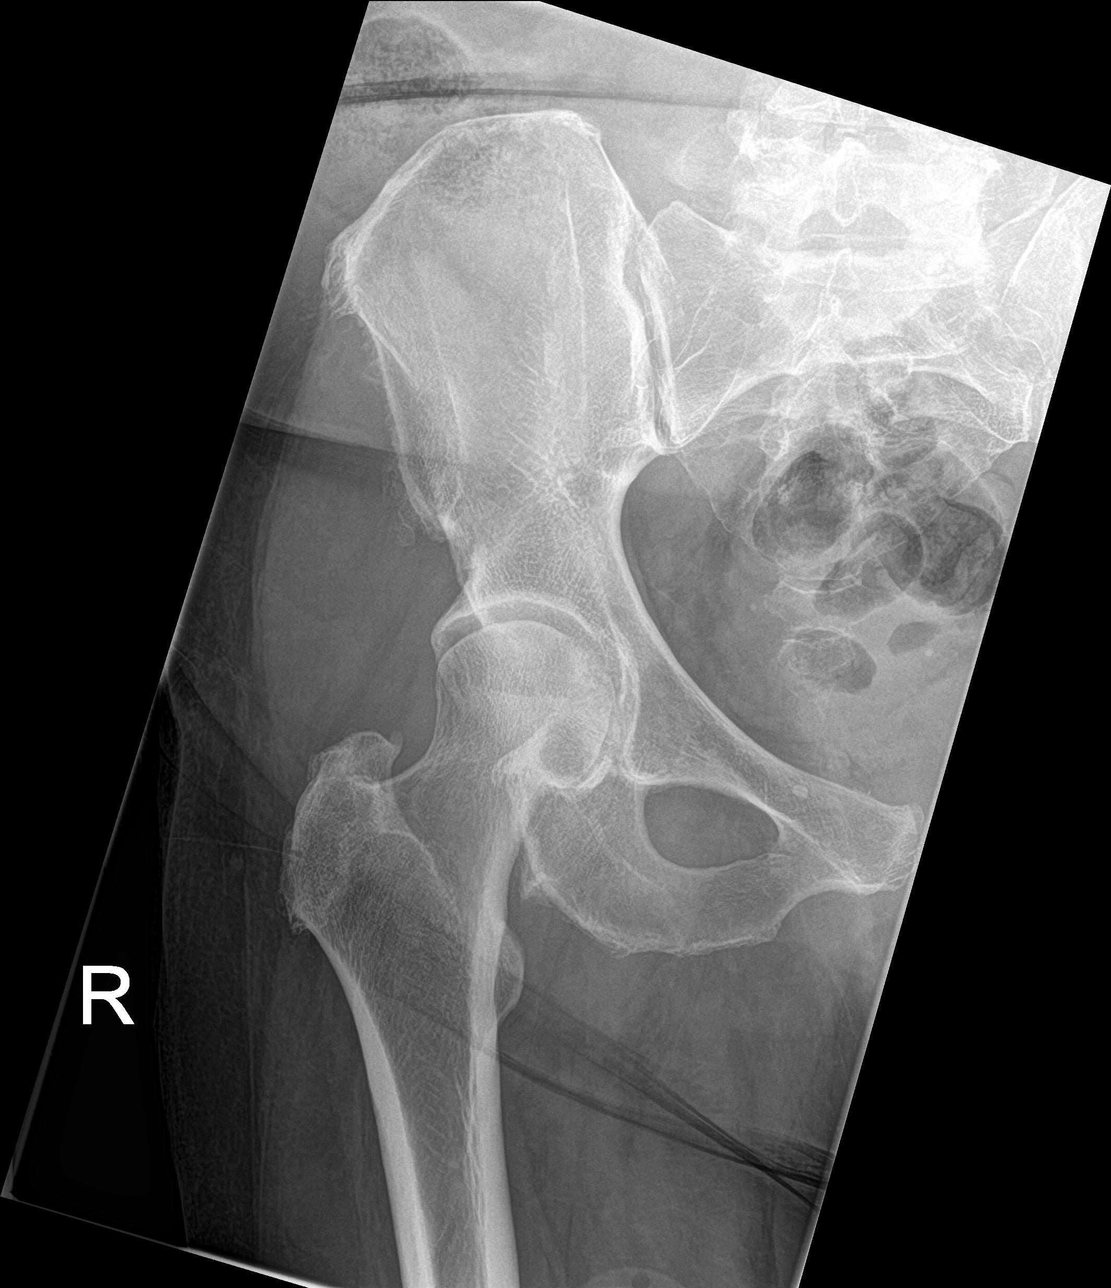

[hip lat]
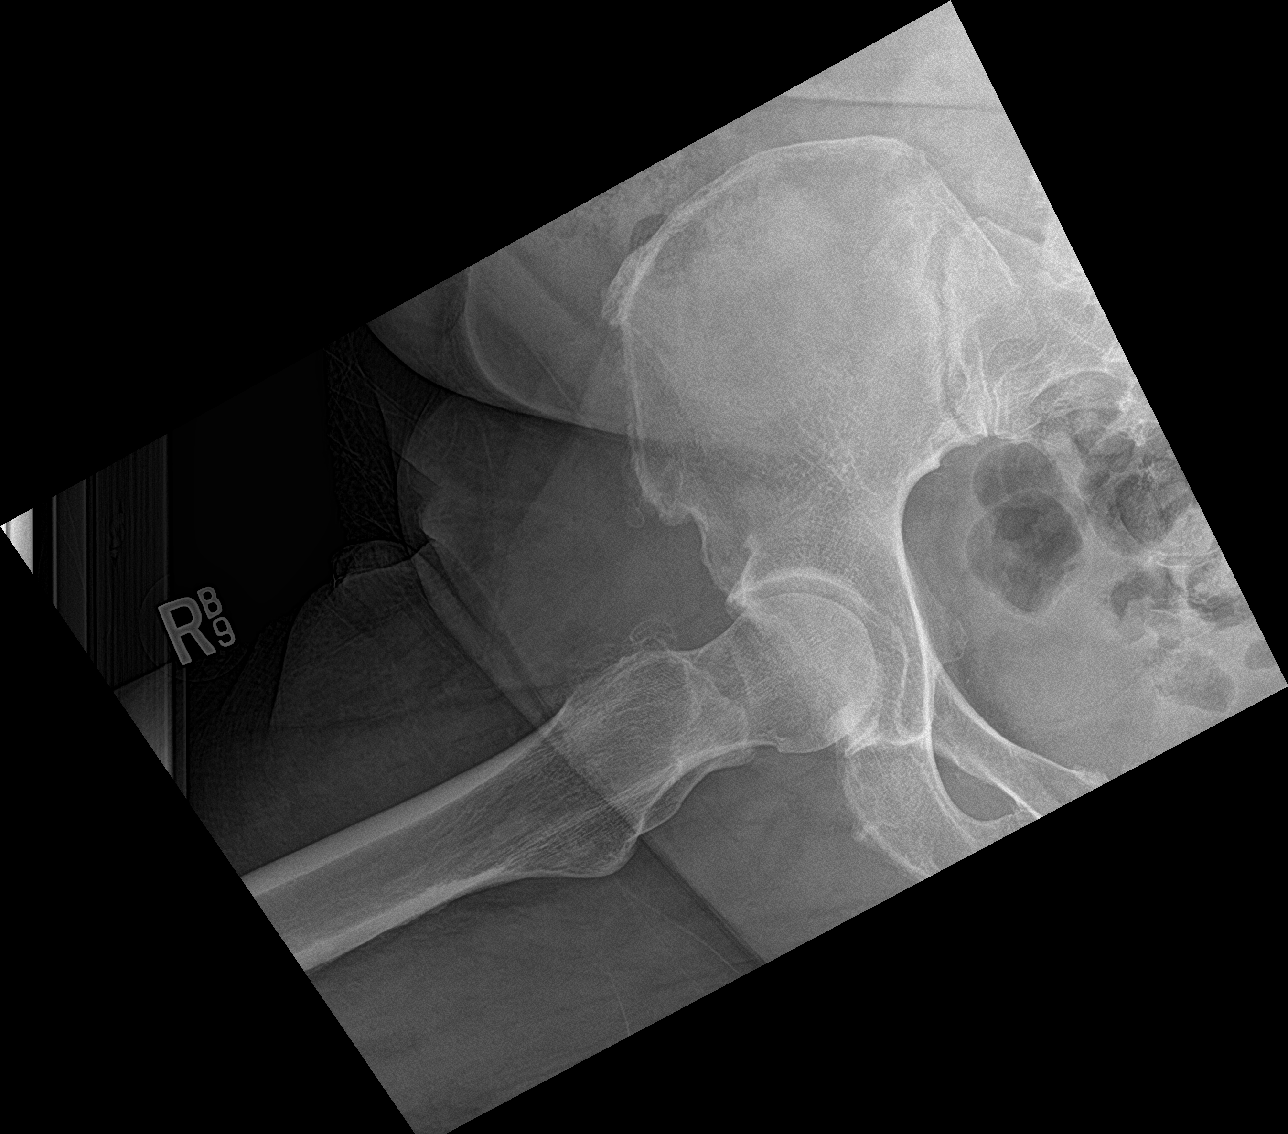

[3 of 3 positions shown; findings below may reference images not displayed]

FINDINGS: There is mild degenerative joint disease of both hips with some
sclerosis and spurring present. No acute fracture is seen. The
pelvic rami are intact. The SI joints appear corticated. Some
degenerative changes noted in the lower lumbar spine.
IMPRESSION: Mild degenerative joint disease of both hips.  No acute abnormality.

## 2020-01-26 DIAGNOSIS — H16223 Keratoconjunctivitis sicca, not specified as Sjogren's, bilateral: Secondary | ICD-10-CM | POA: Diagnosis not present

## 2020-01-26 DIAGNOSIS — H25011 Cortical age-related cataract, right eye: Secondary | ICD-10-CM | POA: Diagnosis not present

## 2020-01-26 DIAGNOSIS — H25042 Posterior subcapsular polar age-related cataract, left eye: Secondary | ICD-10-CM | POA: Diagnosis not present

## 2020-01-26 DIAGNOSIS — E119 Type 2 diabetes mellitus without complications: Secondary | ICD-10-CM | POA: Diagnosis not present

## 2020-01-26 LAB — HM DIABETES EYE EXAM

## 2020-01-30 ENCOUNTER — Other Ambulatory Visit: Payer: Self-pay

## 2020-01-30 ENCOUNTER — Ambulatory Visit (INDEPENDENT_AMBULATORY_CARE_PROVIDER_SITE_OTHER): Payer: Medicare Other

## 2020-01-30 DIAGNOSIS — Z111 Encounter for screening for respiratory tuberculosis: Secondary | ICD-10-CM | POA: Diagnosis not present

## 2020-01-30 NOTE — Progress Notes (Signed)
Patient presents in nurse clinic for PPD placement.   PPD placed in left ventral forearm, wheel present.   Patient to return to nurse clinic on 8/20 in the am to have site read.

## 2020-02-02 ENCOUNTER — Other Ambulatory Visit: Payer: Self-pay

## 2020-02-02 ENCOUNTER — Ambulatory Visit: Payer: Medicare Other

## 2020-02-02 DIAGNOSIS — Z111 Encounter for screening for respiratory tuberculosis: Secondary | ICD-10-CM

## 2020-02-02 LAB — TB SKIN TEST
Induration: 0 mm
TB Skin Test: NEGATIVE

## 2020-02-02 NOTE — Progress Notes (Signed)
PPD Reading Note  PPD read and results entered in EpicCare.  Result: 0 mm induration.  Interpretation: Negative  Allergic reaction: no

## 2020-02-26 DIAGNOSIS — H16223 Keratoconjunctivitis sicca, not specified as Sjogren's, bilateral: Secondary | ICD-10-CM | POA: Diagnosis not present

## 2020-02-26 DIAGNOSIS — H16213 Exposure keratoconjunctivitis, bilateral: Secondary | ICD-10-CM | POA: Diagnosis not present

## 2020-02-28 ENCOUNTER — Encounter: Payer: Self-pay | Admitting: Family Medicine

## 2020-02-28 ENCOUNTER — Ambulatory Visit (INDEPENDENT_AMBULATORY_CARE_PROVIDER_SITE_OTHER): Payer: Medicare Other | Admitting: Family Medicine

## 2020-02-28 ENCOUNTER — Other Ambulatory Visit: Payer: Self-pay

## 2020-02-28 VITALS — BP 172/82 | HR 59 | Ht 66.5 in | Wt 191.6 lb

## 2020-02-28 DIAGNOSIS — I10 Essential (primary) hypertension: Secondary | ICD-10-CM

## 2020-02-28 DIAGNOSIS — Z23 Encounter for immunization: Secondary | ICD-10-CM

## 2020-02-28 DIAGNOSIS — M659 Synovitis and tenosynovitis, unspecified: Secondary | ICD-10-CM

## 2020-02-28 DIAGNOSIS — M65931 Unspecified synovitis and tenosynovitis, right forearm: Secondary | ICD-10-CM

## 2020-02-28 MED ORDER — DICLOFENAC SODIUM 1 % EX GEL: 2.0000 g | Freq: Four times a day (QID) | CUTANEOUS | 0 refills | Status: DC

## 2020-02-28 NOTE — Patient Instructions (Addendum)
Please take your blood pressure medication.   Tenosynovitis  Tenosynovitis is inflammation of a tendon and of the sleeve of tissue that covers the tendon (tendon sheath). A tendon is a cord of tissue that connects muscle to bone. Normally, a tendon slides smoothly inside its tendon sheath. Tenosynovitis limits movement of the tendon and surrounding tissues, which may cause pain and stiffness. Tenosynovitis can affect any tendon and tendon sheath. Commonly affected areas include tendons in the:  Wrist.  Arm.  Hand.  Hip.  Leg.  Foot.  Shoulder. What are the causes? The main cause of this condition is wear and tear over time that results in slight tears in the tendon. Other possible causes include:  An injury to the tendon or tendon sheath.  A disease that causes inflammation in the body.  An infection that spreads to the tendon and tendon sheath from a skin wound.  An infection in another part of the body that spreads to the tendon and tendon sheath through the blood. What increases the risk? The following factors may make you more likely to develop this condition:  Having rheumatoid arthritis, gout, or diabetes.  Using IV drugs.  Doing physical activities that can cause tendon overuse and stress.  Having gonorrhea. What are the signs or symptoms? Symptoms of this condition depend on the cause. Symptoms may include:  Pain with movement.  Pain when pressing on the tendon and tendon sheath.  Swelling.  Stiffness. If tenosynovitis is caused by an infection, symptoms may include:  Fever.  Redness.  Warmth. How is this diagnosed? This condition may be diagnosed based on your medical history and a physical exam. You also may have:  Blood tests.  Imaging tests, such as: ? MRI. ? Ultrasound.  A sample of fluid removed from inside the tendon sheath to be checked in a lab. How is this treated? Treatment for this condition depends on the cause. If tenosynovitis  is not caused by an infection, treatment may include:  Rest.  Keeping the tendon in place (immobilization) in a splint, brace, or sling.  Taking NSAIDs to reduce pain and swelling.  A shot (injection) of medicine to help reduce pain and swelling (steroid).  Icing or applying heat to the affected area.  Physical therapy.  Surgery to release the tendon in the sheath or to repair damage to the tendon or tendon sheath. Surgery may be done if other treatments do not help relieve symptoms. If tenosynovitis is caused by infection, treatment may include antibiotic medicine given through an IV. In some cases, surgery may be needed to drain fluid from the tendon sheath or to remove the tendon sheath. Follow these instructions at home: If you have a splint, brace, or sling:   Wear the splint, brace, or sling as told by your health care provider. Remove it only as told by your health care provider.  Loosen the splint, brace, or sling if your fingers or toes tingle, become numb, or turn cold and blue.  Keep the splint, brace, or sling clean.  If the splint, brace, or sling is not waterproof: ? Do not let it get wet. ? Cover it with a watertight covering when you take a bath or shower. Managing pain, stiffness, and swelling   If directed, put ice on the affected area. ? Put ice in a plastic bag. ? Place a towel between your skin and the bag. ? Leave the ice on for 20 minutes, 2-3 times a day.  Move the fingers  or toes of the affected limb often, if this applies. This can help to reduce stiffness and swelling.  If directed, raise (elevate) the affected area above the level of your heart while you are sitting or lying down.  If directed, apply heat to the affected area before you exercise. Use the heat source that your health care provider recommends, such as a moist heat pack or a heating pad. ? Place a towel between your skin and the heat source. ? Leave the heat on for 20-30  minutes. ? Remove the heat if your skin turns bright red. This is especially important if you are unable to feel pain, heat, or cold. You may have a greater risk of getting burned. Medicines  Take over-the-counter and prescription medicines only as told by your health care provider.  Ask your health care provider if the medicine prescribed to you: ? Requires you to avoid driving or using heavy machinery. ? Can cause constipation. You may need to take actions to prevent or treat constipation, such as:  Drink enough fluid to keep your urine pale yellow.  Take over-the-counter or prescription medicines.  Eat foods that are high in fiber, such as beans, whole grains, and fresh fruits and vegetables.  Limit foods that are high in fat and processed sugars, such as fried or sweet foods. Activity  Return to your normal activities as told by your health care provider. Ask your health care provider what activities are safe for you.  Rest the affected area as told by your health care provider.  Avoid using the affected area while you are having symptoms.  Do not use the injured limb to support your body weight until your health care provider says that you can.  If physical therapy was prescribed, do exercises as told by your health care provider. General instructions  Ask your health care provider when it is safe to drive if you have a splint or brace on any part of your arm or leg.  Keep all follow-up visits as told by your health care provider. This is important. Contact a health care provider if:  Your symptoms are not improving or are getting worse. Get help right away if:  Your fingers or toes become numb or turn blue.  You have a fever and more of any of the following symptoms: ? Pain. ? Redness. ? Warmth. ? Swelling. Summary  Tenosynovitis is inflammation of a tendon and of the sleeve of tissue that covers the tendon (tendon sheath).  Treatment for this condition depends  on the cause. Treatment may include rest, medicines, physical therapy, or surgery.  Contact a health care provider if your symptoms are not improving or are getting worse.  Keep all follow-up visits as told by your health care provider. This is important. This information is not intended to replace advice given to you by your health care provider. Make sure you discuss any questions you have with your health care provider. Document Revised: 01/20/2018 Document Reviewed: 01/20/2018 Elsevier Patient Education  2020 [employer].

## 2020-02-28 NOTE — Progress Notes (Signed)
    SUBJECTIVE:   CHIEF COMPLAINT / HPI:   Right wrist pain Started 2 weeks ago. Located on the thumb side of wrist and radiating slightly past the wrist joint. Hurting worse with twisting and bending motions.  Has tried hot water and Epson salt with some relief.  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  Also does a lot cooking in stirring it is painful. Denies any falls or trauma to the area.   PERTINENT  PMH / PSH: DJD, osteoarthritis, obesity  OBJECTIVE:   BP (!) 172/82   Pulse (!) 59   Ht 5' 6.5" (1.689 m)   Wt 191 lb 9.6 oz (86.9 kg)   SpO2 96%   BMI 30.46 kg/m   General: Appears well, no acute distress. Age appropriate. MSK/Extremities: +Finkelstein test. Negative for snuff box tenderness. ROM limited by pain. No edema or bruising present.   ASSESSMENT/PLAN:   Tenosynovitis of right wrist Acute. Classic findings for tenosynovitis due to overuse. Negative for FOOSH. Would not image at this time.  -Brace with thumb spica -Diclofenac gel as needed -Follow up if symptoms fail to improve.  HYPERTENSION, BENIGN SYSTEMIC Did not take blood pressure medication this morning. No symptoms.  -Continue current regimen -Assess at follow up   Lavonda Jumbo, DO [facility]

## 2020-03-04 DIAGNOSIS — M659 Synovitis and tenosynovitis, unspecified: Secondary | ICD-10-CM | POA: Insufficient documentation

## 2020-03-04 NOTE — Assessment & Plan Note (Signed)
Acute. Classic findings for tenosynovitis due to overuse. Negative for FOOSH. Would not image at this time.  -Brace with thumb spica -Diclofenac gel as needed -Follow up if symptoms fail to improve.

## 2020-03-04 NOTE — Assessment & Plan Note (Signed)
Did not take blood pressure medication this morning. No symptoms.  -Continue current regimen -Assess at follow up

## 2020-03-15 ENCOUNTER — Other Ambulatory Visit: Payer: Self-pay | Admitting: Family Medicine

## 2020-03-15 DIAGNOSIS — F339 Major depressive disorder, recurrent, unspecified: Secondary | ICD-10-CM

## 2020-03-27 ENCOUNTER — Other Ambulatory Visit: Payer: Self-pay | Admitting: Family Medicine

## 2020-03-27 DIAGNOSIS — M659 Synovitis and tenosynovitis, unspecified: Secondary | ICD-10-CM

## 2020-03-27 DIAGNOSIS — M65931 Unspecified synovitis and tenosynovitis, right forearm: Secondary | ICD-10-CM

## 2020-03-29 ENCOUNTER — Ambulatory Visit (INDEPENDENT_AMBULATORY_CARE_PROVIDER_SITE_OTHER): Payer: Medicare Other | Admitting: Family Medicine

## 2020-03-29 ENCOUNTER — Other Ambulatory Visit: Payer: Self-pay

## 2020-03-29 ENCOUNTER — Ambulatory Visit (HOSPITAL_COMMUNITY)
Admission: RE | Admit: 2020-03-29 | Discharge: 2020-03-29 | Disposition: A | Discharge status: Home / Self Care | Payer: Medicare Other | Source: Ambulatory Visit | Attending: Family Medicine | Admitting: Family Medicine

## 2020-03-29 ENCOUNTER — Encounter: Payer: Self-pay | Admitting: Family Medicine

## 2020-03-29 VITALS — BP 148/68 | HR 75 | Ht 66.5 in | Wt 194.0 lb

## 2020-03-29 DIAGNOSIS — M25531 Pain in right wrist: Secondary | ICD-10-CM | POA: Insufficient documentation

## 2020-03-29 DIAGNOSIS — M25431 Effusion, right wrist: Secondary | ICD-10-CM

## 2020-03-29 DIAGNOSIS — M19041 Primary osteoarthritis, right hand: Secondary | ICD-10-CM | POA: Diagnosis not present

## 2020-03-29 MED ORDER — NAPROXEN 500 MG PO TABS: 500.0000 mg | ORAL_TABLET | Freq: Two times a day (BID) | ORAL | 0 refills | Status: DC

## 2020-03-29 NOTE — Patient Instructions (Signed)
Wrist and Forearm Exercises Ask your health care provider which exercises are safe for you. Do exercises exactly as told by your health care provider and adjust them as directed. It is normal to feel mild stretching, pulling, tightness, or discomfort as you do these exercises. Stop right away if you feel sudden pain or your pain gets worse. Do not begin these exercises until told by your health care provider. Range-of-motion exercises These exercises warm up your muscles and joints and improve the movement and flexibility of your injured wrist and forearm. These exercises also help to relieve pain, numbness, and tingling. These exercises are done using the muscles in your injured wrist and forearm. Wrist flexion 1. Bend your left / right elbow to a 90-degree angle (right angle) with your palm facing the floor. 2. Bend your wrist so that your fingers point toward the floor (flexion). 3. Hold this position for __________ seconds. 4. Slowly return to the starting position. Repeat __________ times. Complete this exercise __________ times a day. Wrist extension 1. Bend your left / right elbow to a 90-degree angle (right angle) with your palm facing the floor. 2. Bend your wrist so that your fingers point toward the ceiling (extension). 3. Hold this position for __________ seconds. 4. Slowly return to the starting position. Repeat __________ times. Complete this exercise __________ times a day. Ulnar deviation 1. Bend your left / right elbow to a 90-degree angle (right angle), and rest your forearm on a table with your palm facing down. 2. Keeping your hand flat on the table, bend your left /right wrist toward your small finger (pinkie). This is ulnar deviation. 3. Hold this position for __________ seconds. 4. Slowly return to the starting position. Repeat __________ times. Complete this exercise __________ times a day. Radial deviation 1. Bend your left / right elbow to a 90-degree angle (right  angle), and rest your forearm on a table with your palm facing down. 2. Keeping your hand flat on the table, bend your left /right wrist toward your thumb. This is radial deviation. 3. Hold this position for __________ seconds. 4. Slowly return to the starting position. Repeat __________ times. Complete this exercise __________ times a day. Forearm rotation, supination  1. Sit with your left / right elbow bent to a 90-degree angle (right angle). Position your forearm so that the thumb is facing the ceiling (neutral position). 2. Turn (rotate) your palm up toward the ceiling (supination), stopping when you feel a gentle stretch. 3. Hold this position for __________ seconds. 4. Slowly return to the starting position. Repeat __________ times. Complete this exercise __________ times a day. Forearm rotation, pronation  1. Sit with your left / right elbow bent to a 90-degree angle (right angle). Position your forearm so that the thumb is facing the ceiling (neutral position). 2. Rotate your palm down toward the floor (pronation), stopping when you feel a gentle stretch. 3. Hold this position for __________ seconds. 4. Slowly return to the starting position. Repeat __________ times. Complete this exercise __________ times a day. Stretching These exercises warm up your muscles and joints and improve the movement and flexibility of your injured wrist and forearm. These exercises also help to relieve pain, numbness, and tingling. These exercises are done using your healthy wrist and forearm to help stretch the muscles in your injured wrist and forearm. Wrist flexion  1. Extend your left / right arm in front of you, and turn your palm down toward the floor. ? If told by   your health care provider, bend your left / right elbow to a 90-degree angle (right angle) at your side. 2. Using your uninjured hand, gently press over the back of your left / right hand to bend your wrist and fingers toward the floor  (flexion). Go as far as you can to feel a stretch without causing pain. 3. Hold this position for __________ seconds. 4. Slowly return to the starting position. Repeat __________ times. Complete this exercise __________ times a day. Wrist extension  1. Extend your left / right arm in front of you and turn your palm up toward the ceiling. ? If told by your health care provider, bend your left / right elbow to a 90-degree angle (right angle) at your side. 2. Using your uninjured hand, gently press over the palm of your left / right hand to bend your wrist and fingers toward the floor (extension). Go as far as you can to feel a stretch without causing pain. 3. Hold this position for __________ seconds. 4. Slowly return to the starting position. Repeat __________ times. Complete this exercise __________ times a day. Forearm rotation, supination 1. Sit with your left / right elbow bent to a 90-degree angle (right angle). Position your forearm so that the thumb is facing the ceiling (neutral position). 2. Rotate your palm up toward the ceiling as far as you can on your own (supination). Then, use your uninjured hand to help turn your forearm more, stopping when you feel a gentle stretch. 3. Hold this position for __________ seconds. 4. Slowly return to the starting position. Repeat __________ times. Complete this exercise __________ times a day. Forearm rotation, pronation 1. Sit with your left / right elbow bent to a 90-degree angle (right angle). Position your forearm so that the thumb is facing the ceiling (neutral position). 2. Rotate your palm down toward the floor as far as you can on your own (pronation). Then, use your uninjured hand to help turn your forearm more, stopping when you feel a gentle stretch. 3. Hold this position for __________ seconds. 4. Slowly return to the starting position. Repeat __________ times. Complete this exercise __________ times a day. Strengthening exercises  These exercises build strength and endurance in your wrist and forearm. Endurance is the ability to use your muscles for a long time, even after they get tired. Wrist flexion  1. Sit with your left / right forearm supported on a table or other surface. Bend your elbow to a 90-degree angle (right angle), and rest your hand palm-up over the edge of the table. 2. Hold a __________ weight in your left / right hand. Or, hold an exercise band or tube in both hands, keeping your hands at the same level and hip distance apart. There should be a slight tension in the exercise band or tube. 3. Slowly curl your hand up toward the ceiling (flexion). 4. Hold this position for __________ seconds. 5. Slowly lower your hand back to the starting position. Repeat __________ times. Complete this exercise __________ times a day. Wrist extension  1. Sit with your left / right forearm supported on a table or other surface. Bend your elbow to a 90-degree angle (right angle), and rest your hand palm-down over the edge of the table. 2. Hold a __________ weight in your left / right hand. Or, hold an exercise band or tube in both hands, keeping your hands at the same level and hip distance apart. There should be a slight tension in the exercise   band or tube. 3. Slowly curl your hand up toward the ceiling (extension). 4. Hold this position for __________ seconds. 5. Slowly lower your hand back to the starting position. Repeat __________ times. Complete this exercise __________ times a day. Forearm rotation, supination  1. Sit with your left / right forearm supported on a table or other surface. Bend your elbow to a 90-degree angle (right angle). Position your forearm so that your thumb is facing the ceiling (neutral position) and your hand is resting over the edge of the table. 2. Hold a hammer in your left / right hand. ? This exercise will be easier if you hold the hammer near the head of the hammer. ? This exercise  will be harder if you hold the hammer near the end of the handle. 3. Without moving your elbow, slowly rotate your palm up toward the ceiling (supination). 4. Hold this position for __________ seconds. 5. Slowly return to the starting position. Repeat __________ times. Complete this exercise __________ times a day. Forearm rotation, pronation  1. Sit with your left / right forearm supported on a table or other surface. Bend your elbow to a 90-degree angle (right angle). Position your forearm so that the thumb is facing the ceiling (neutral position), with your hand resting over the edge of the table. 2. Hold a hammer in your left / right hand. ? This exercise will be easier if you hold the hammer near the head of the hammer. ? This exercise will be harder if you hold the hammer near the end of the handle. 3. Without moving your elbow, slowly rotate your palm down toward the floor (pronation). 4. Hold this position for __________ seconds. 5. Slowly return to the starting position. Repeat __________ times. Complete this exercise __________ times a day. Grip strengthening  1. Grasp a stress ball or other ball in the middle of your left / right hand. Start with your elbow bent to a 90-degree angle (right angle). 2. Slowly increase the pressure, squeezing the ball as hard as you can without causing pain. ? Think of bringing the tips of your fingers into the middle of your palm. All of your finger joints should bend when doing this exercise. ? To make this exercise harder, gradually try to straighten your elbow in front of you, until you can do the exercise with your elbow fully straight. 3. Hold your squeeze for __________ seconds, then relax. If instructed by your health care provider, do this exercise: ? With your forearm positioned so that the thumb is facing the ceiling (neutral position). ? With your forearm turned palm down. ? With your forearm turned palm up. Repeat __________ times.  Complete this exercise __________ times a day. This information is not intended to replace advice given to you by your health care provider. Make sure you discuss any questions you have with your health care provider. Document Revised: 07/21/2018 Document Reviewed: 07/21/2018 Elsevier Patient Education  2020 [employer]  

## 2020-03-29 NOTE — Progress Notes (Signed)
    SUBJECTIVE:   CHIEF COMPLAINT / HPI:  Hand Pain  Incident onset: Started about 2 months ago. There was no injury mechanism. The quality of the pain is described as aching (Right wrist pain. She is right handed and pain worsens with use. Pain is same all day. She is unable to sleep at night due to pain). The pain is at a severity of 10/10. The pain is severe. The pain has been constant since the incident. Pertinent negatives include no muscle weakness, numbness or tingling. The symptoms are aggravated by movement and lifting. She has tried NSAIDs (OTC Advil) for the symptoms. The treatment provided moderate relief.  No prior episode. 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  PERTINENT  PMH / PSH: PMX reviewed.  OBJECTIVE:   BP (!) 148/68   Pulse 75   Ht 5' 6.5" (1.689 m)   Wt 194 lb (88 kg)   SpO2 98%   BMI 30.84 kg/m   Physical Exam Vitals and nursing note reviewed.  Cardiovascular:     Rate and Rhythm: Normal rate and regular rhythm.     Heart sounds: Normal heart sounds. No murmur heard.   Pulmonary:     Effort: Pulmonary effort is normal. No respiratory distress.     Breath sounds: Normal breath sounds. No wheezing or rhonchi.  Musculoskeletal:     Right hand: Swelling and tenderness present. Decreased range of motion. Normal strength. Normal sensation. Normal pulse.     Left hand: Normal. Normal pulse.      ASSESSMENT/PLAN:   Wrist pain: Obvious mild to moderate swelling today. No erythema. Likely OA. Xray ordered. Naproxen prescribed for 2 weeks. She will purchase liniment OTC and massage her wrist as needed. I will contact her soon with the xray report.   Janit Pagan, MD [facility]

## 2020-04-01 ENCOUNTER — Telehealth: Payer: Self-pay | Admitting: Family Medicine

## 2020-04-01 NOTE — Telephone Encounter (Signed)
Xray showed arthritic changes of her joint. Result discussed with her. She is yet to pick up her Naproxen. She will follow-up with her PCP for reassessment.   DG Hand Complete Right  Result Date: 03/31/2020 CLINICAL DATA:  Severe wrist, hand, and forearm pain. Swelling for 2 months without injury. EXAM: RIGHT HAND - COMPLETE 3+ VIEW COMPARISON:  None. FINDINGS: No fractures or dislocations. Osteoarthritic changes, primarily in the distal interphalangeal joints and at the base of the first metacarpal with osteophytes. No other abnormalities. IMPRESSION: Mild osteoarthritic changes as above. Electronically Signed   By: Gerome Sam III M.D   On: 03/31/2020 16:51   MM 3D SCREEN BREAST BILATERAL  Result Date: 01/19/2020 CLINICAL DATA:  Screening. EXAM: DIGITAL SCREENING BILATERAL MAMMOGRAM WITH TOMO AND CAD COMPARISON:  Previous exam(s). ACR Breast Density Category b: There are scattered areas of fibroglandular density. FINDINGS: There are no findings suspicious for malignancy. Images were processed with CAD. IMPRESSION: No mammographic evidence of malignancy. A result letter of this screening mammogram will be mailed directly to the patient. RECOMMENDATION: Screening mammogram in one year. (Code:SM-B-01Y) BI-RADS CATEGORY  1: Negative. Electronically Signed   By: Baird Lyons M.D.   On: 01/19/2020 12:45

## 2020-04-02 ENCOUNTER — Other Ambulatory Visit: Payer: Self-pay | Admitting: Family Medicine

## 2020-04-02 MED ORDER — NAPROXEN 500 MG PO TABS: 500.0000 mg | ORAL_TABLET | Freq: Two times a day (BID) | ORAL | 0 refills | Status: AC

## 2020-04-17 ENCOUNTER — Other Ambulatory Visit: Payer: Self-pay

## 2020-04-17 ENCOUNTER — Encounter: Payer: Self-pay | Admitting: Family Medicine

## 2020-04-17 ENCOUNTER — Ambulatory Visit (INDEPENDENT_AMBULATORY_CARE_PROVIDER_SITE_OTHER): Payer: Medicare Other | Admitting: Family Medicine

## 2020-04-17 VITALS — BP 178/82 | HR 67 | Wt 194.0 lb

## 2020-04-17 DIAGNOSIS — M19031 Primary osteoarthritis, right wrist: Secondary | ICD-10-CM

## 2020-04-17 DIAGNOSIS — I10 Essential (primary) hypertension: Secondary | ICD-10-CM | POA: Diagnosis not present

## 2020-04-17 DIAGNOSIS — M659 Synovitis and tenosynovitis, unspecified: Secondary | ICD-10-CM | POA: Diagnosis not present

## 2020-04-17 MED ORDER — NAPROXEN 500 MG PO TABS: 500.0000 mg | ORAL_TABLET | Freq: Two times a day (BID) | ORAL | 0 refills | Status: DC

## 2020-04-17 NOTE — Assessment & Plan Note (Addendum)
Improving. Recent imaging without acute findings.  -Naproxen 500 mg BID x2 weeks additional for continued discomfort as above -Follow up PRN  -Consider PT as above

## 2020-04-17 NOTE — Patient Instructions (Addendum)
It was wonderful to see you today.  Please bring ALL of your medications with you to every visit.   Today we talked about:  Continued right wrist pain. Continue naproxen, ice/heat, hand exercises, splinting. Follow up with phone conversation in 2 weeks to consider physical therapy.   Your blood pressure was elevated today in the office. Please take your medication when you get home.   Please be sure to schedule follow up at the front  desk before you leave today.   Please call the clinic at [phone] if your symptoms worsen or you have any concerns. It was our pleasure to serve you.  Dr. Salvadore Dom

## 2020-04-17 NOTE — Progress Notes (Signed)
    SUBJECTIVE:   CHIEF COMPLAINT / HPI:   [name] is a 69 yo F who presents for follow up.   Wrist Pain Following up due to 2 months of right wrist pain.  Recently seen and prescribed naproxen.  Naproxen is providing some relief.  Other modalities tried is hand creams, ice, heat, splinting, hand exercise.  Patient continues to deny trauma to the area.  Endorses that it has gotten better improving from a 10 to a 7 since last visit.  Hypertension  - Medications: Lisinopril 10 mg daily - Compliance: Yes but did not take this morning because she did not have breakfast - Checking BP at home: Yes average SBP 135-140 - Denies any SOB, CP, vision changes, LE edema, medication SEs, or symptoms of hypotension  PERTINENT  PMH / PSH: DJD of hips, hx of trigger finger  OBJECTIVE:   BP (!) 178/82   Pulse 67   Wt 194 lb (88 kg)   SpO2 97%   BMI 30.84 kg/m   General: Appears well, no acute distress. Age appropriate. Respiratory: normal effort Extremities: Right wrist without bruising or signs of trauma. Mild edema on the lateral side of wrist. Mildly tender proximal lateral forearm. Decreased ROM of fingers and wrist.   RIGHT HAND - COMPLETE 3+ VIEW (03/29/2020)  COMPARISON:  None.  FINDINGS: No fractures or dislocations. Osteoarthritic changes, primarily in the distal interphalangeal joints and at the base of the first metacarpal with osteophytes. No other abnormalities.  IMPRESSION: Mild osteoarthritic changes as above.  ASSESSMENT/PLAN:   Tenosynovitis of right wrist Ongoing for 2 months. Tender laterality of right wrist. Likely due to overuse.  -Emphasized wrist splinting -Hand exercises (ice prior and heating after) -Consider PT if continues  Osteoarthritis Improving. Recent imaging without acute findings.  -Naproxen 500 mg BID x2 weeks additional for continued discomfort as above -Follow up PRN  -Consider PT as above  HYPERTENSION, BENIGN SYSTEMIC BP elevated  today. Patient has not taken medication today. Home BPs appear to be appropriate at this time.  -Encouraged to continue medication -Monitor with subsequent in office visits    Candice Jumbo, DO [facility]

## 2020-04-17 NOTE — Assessment & Plan Note (Signed)
BP elevated today. Patient has not taken medication today. Home BPs appear to be appropriate at this time.  -Encouraged to continue medication -Monitor with subsequent in office visits

## 2020-04-17 NOTE — Assessment & Plan Note (Signed)
Ongoing for 2 months. Tender laterality of right wrist. Likely due to overuse.  -Emphasized wrist splinting -Hand exercises (ice prior and heating after) -Consider PT if continues

## 2020-04-23 ENCOUNTER — Other Ambulatory Visit: Payer: Self-pay | Admitting: Family Medicine

## 2020-04-23 DIAGNOSIS — E119 Type 2 diabetes mellitus without complications: Secondary | ICD-10-CM

## 2020-05-13 ENCOUNTER — Telehealth: Payer: Self-pay

## 2020-05-13 ENCOUNTER — Other Ambulatory Visit: Payer: Self-pay | Admitting: Family Medicine

## 2020-05-13 DIAGNOSIS — M5442 Lumbago with sciatica, left side: Secondary | ICD-10-CM

## 2020-05-13 DIAGNOSIS — M19031 Primary osteoarthritis, right wrist: Secondary | ICD-10-CM

## 2020-05-13 DIAGNOSIS — E119 Type 2 diabetes mellitus without complications: Secondary | ICD-10-CM

## 2020-05-13 DIAGNOSIS — M659 Synovitis and tenosynovitis, unspecified: Secondary | ICD-10-CM

## 2020-05-13 DIAGNOSIS — H109 Unspecified conjunctivitis: Secondary | ICD-10-CM

## 2020-05-13 DIAGNOSIS — M65931 Unspecified synovitis and tenosynovitis, right forearm: Secondary | ICD-10-CM

## 2020-05-13 NOTE — Telephone Encounter (Signed)
Patient calls nurse line stating she feels her hand has not improved any and she would like to be referred to a hand specialist. Patient reports she has been to [facility] in the past and would like to go back to them. Please place referral if appropriate.

## 2020-05-14 MED ORDER — LORATADINE 10 MG PO TABS: 10.0000 mg | ORAL_TABLET | Freq: Every day | ORAL | 0 refills | Status: DC

## 2020-05-14 NOTE — Telephone Encounter (Signed)
Referral placed during this encounter.   Lavonda Jumbo, DO 05/14/2020, 12:14 PM PGY-2,  Family Medicine

## 2020-05-23 ENCOUNTER — Other Ambulatory Visit: Payer: Self-pay

## 2020-05-23 ENCOUNTER — Ambulatory Visit: Payer: Medicare Other | Admitting: Sports Medicine

## 2020-05-23 ENCOUNTER — Encounter: Payer: Self-pay | Admitting: Sports Medicine

## 2020-05-23 VITALS — BP 138/80 | Ht 66.5 in | Wt 192.0 lb

## 2020-05-23 DIAGNOSIS — M654 Radial styloid tenosynovitis [de Quervain]: Secondary | ICD-10-CM

## 2020-05-23 MED ORDER — MELOXICAM 15 MG PO TABS: ORAL_TABLET | ORAL | 0 refills | Status: DC

## 2020-05-23 NOTE — Progress Notes (Signed)
   Subjective:    [name], female    DOB: 1950/10/07, 69 y.o.   MRN: [id number]  HPI chief complaint: Right wrist pain  Very pleasant right-hand-dominant 69 year old female comes in today complaining of 3 months of right wrist pain.  No injury that she can recall but a gradual onset of pain and swelling along the radial aspect of her wrist.  Pain is worse with activity, specifically with radial and ulnar deviation of the wrist.  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  Her PCP ordered x-rays of her hand which show degenerative changes in the fingers but no significant degenerative changes at the wrist.  She was placed on naproxen sodium which was minimally helpful but not curative.  She has not had any bracing.  Denies similar problems in the past.  She does note an increase in activity, specifically she has been picking up her new grandchild, but she notes that her wrist pain was present prior to his birth.  Past medical history reviewed Medications reviewed Allergies reviewed    Review of Systems As above    Objective:   Physical Exam  Well-developed, well-nourished.  No acute distress  Right wrist: Patient does have nearly full wrist range of motion.  No effusion.  There is swelling as well as tenderness to palpation along the 1st dorsal compartment.  Positive Finkelstein's.  No erythema.  Good pulses.  Slightly decreased grip strength secondary to pain.      Assessment & Plan:   De Quervain's tenosynovitis, right wrist  We discussed treatment options including immobilization with a brace, NSAIDs, and cortisone injection.  We are going to start with thumb spica immobilization during the day, especially when working.  If her boss is unable to accommodate this request, then I will need to remove her from work until follow-up with me again in 3 to 4 weeks.  I am going to put her on meloxicam 15 mg daily for the next 14 days.  I have also  encouraged her to use an ice massage 2-3 times a day, 5 minutes each time.  If symptoms persist at follow-up or she returns to the office sooner than scheduled, consider cortisone injection.

## 2020-05-23 NOTE — Patient Instructions (Signed)
It was nice meeting you today. You have de Quervain's tenosynovitis of your wrist.  We will give you some information about it. Take the meloxicam with food for the next 14 days.  Stop your naproxen. You must wear your wrist brace during the day, including while at work. I would suggest that you ice your wrist 2-3 times a day, 5 minutes at a time.  Using an ice massage (a Styrofoam cup with frozen water in it) works best. If you are unable to work with your wrist brace on, I will need to remove you from work until you see me again in 4 weeks.

## 2020-06-24 ENCOUNTER — Other Ambulatory Visit: Payer: Self-pay

## 2020-06-24 ENCOUNTER — Ambulatory Visit: Payer: Medicare HMO | Admitting: Sports Medicine

## 2020-06-24 VITALS — BP 154/67 | Ht 66.5 in | Wt 198.0 lb

## 2020-06-24 DIAGNOSIS — M654 Radial styloid tenosynovitis [de Quervain]: Secondary | ICD-10-CM

## 2020-06-24 MED ORDER — METHYLPREDNISOLONE ACETATE 40 MG/ML IJ SUSP
20.0000 mg | Freq: Once | INTRAMUSCULAR | Status: AC
  Administered 2020-06-24: 20 mg via INTRA_ARTICULAR

## 2020-06-24 NOTE — Progress Notes (Signed)
   Subjective:    [name], female    DOB: 11/25/1950, 70 y.o.   MRN: [id number]  HPI  Candice Warner comes in today for follow-up on right wrist de Quervain's tenosynovitis.  She is still having pain despite 4 weeks of thumb spica immobilization.  She would like to proceed with a cortisone injection today.   Review of Systems     Objective:   Physical Exam  Well-developed, well-nourished.  Right wrist: Limited range of motion secondary to pain.  Slight amount of swelling in the first extensor compartment.  Markedly positive Finkelstein's.  Neurovascular intact distally.      Assessment & Plan:   Right wrist pain secondary to de Quervain's tenosynovitis  First extensor compartment is injected with cortisone today.  She may discontinue her thumb spica brace as symptoms allow.  If symptoms persist despite today's injection, consider referral to hand surgery for possible first compartment release.  Follow-up for ongoing or recalcitrant issues.  Consent obtained and verified. Time-out conducted. Noted no overlying erythema, induration, or other signs of local infection. Skin prepped in a sterile fashion. Topical analgesic spray: Ethyl chloride. Joint: First extensor compartment, right wrist Needle: 5/8 inch Completed without difficulty. Meds: 0.5 cc Depo-Medrol, 0.5 cc Xylocaine  Advised to call if fevers/chills, erythema, induration, drainage, or persistent bleeding.

## 2020-07-08 ENCOUNTER — Telehealth: Payer: Self-pay | Admitting: *Deleted

## 2020-07-08 NOTE — Telephone Encounter (Signed)
[facility] [address], [place], [place] [address] Phone: [phone] Dr Dairl Ponder Tuesday 07/16/20 @ 230p

## 2020-07-23 DIAGNOSIS — M79644 Pain in right finger(s): Secondary | ICD-10-CM | POA: Diagnosis not present

## 2020-07-23 DIAGNOSIS — M654 Radial styloid tenosynovitis [de Quervain]: Secondary | ICD-10-CM | POA: Diagnosis not present

## 2020-07-23 DIAGNOSIS — M65841 Other synovitis and tenosynovitis, right hand: Secondary | ICD-10-CM | POA: Diagnosis not present

## 2020-07-23 DIAGNOSIS — M1811 Unilateral primary osteoarthritis of first carpometacarpal joint, right hand: Secondary | ICD-10-CM | POA: Diagnosis not present

## 2020-08-07 DIAGNOSIS — M654 Radial styloid tenosynovitis [de Quervain]: Secondary | ICD-10-CM | POA: Diagnosis not present

## 2020-08-07 DIAGNOSIS — M1811 Unilateral primary osteoarthritis of first carpometacarpal joint, right hand: Secondary | ICD-10-CM | POA: Diagnosis not present

## 2020-08-12 ENCOUNTER — Other Ambulatory Visit: Payer: Self-pay | Admitting: Family Medicine

## 2020-08-12 DIAGNOSIS — H109 Unspecified conjunctivitis: Secondary | ICD-10-CM

## 2020-08-13 ENCOUNTER — Other Ambulatory Visit: Payer: Self-pay | Admitting: Family Medicine

## 2020-08-13 DIAGNOSIS — F339 Major depressive disorder, recurrent, unspecified: Secondary | ICD-10-CM

## 2020-08-15 ENCOUNTER — Other Ambulatory Visit: Payer: Self-pay | Admitting: Family Medicine

## 2020-08-15 DIAGNOSIS — H109 Unspecified conjunctivitis: Secondary | ICD-10-CM

## 2020-08-15 DIAGNOSIS — M5442 Lumbago with sciatica, left side: Secondary | ICD-10-CM

## 2020-08-15 DIAGNOSIS — M659 Synovitis and tenosynovitis, unspecified: Secondary | ICD-10-CM

## 2020-08-15 DIAGNOSIS — M19031 Primary osteoarthritis, right wrist: Secondary | ICD-10-CM

## 2020-09-19 ENCOUNTER — Other Ambulatory Visit: Payer: Self-pay | Admitting: Sports Medicine

## 2020-10-21 ENCOUNTER — Other Ambulatory Visit: Payer: Self-pay

## 2020-10-21 ENCOUNTER — Ambulatory Visit (INDEPENDENT_AMBULATORY_CARE_PROVIDER_SITE_OTHER): Payer: Medicare HMO

## 2020-10-21 DIAGNOSIS — Z111 Encounter for screening for respiratory tuberculosis: Secondary | ICD-10-CM | POA: Diagnosis not present

## 2020-10-21 NOTE — Progress Notes (Signed)
Patient is here for a PPD placement.  PPD placed in left forearm @ 2:50 pm.  Patient will return 10/24/2020 to have PPD read. Veronda Prude, RN

## 2020-10-24 ENCOUNTER — Ambulatory Visit: Payer: Medicare HMO

## 2020-10-29 ENCOUNTER — Ambulatory Visit (INDEPENDENT_AMBULATORY_CARE_PROVIDER_SITE_OTHER): Payer: Medicare HMO

## 2020-10-29 ENCOUNTER — Other Ambulatory Visit: Payer: Self-pay

## 2020-10-29 DIAGNOSIS — Z111 Encounter for screening for respiratory tuberculosis: Secondary | ICD-10-CM

## 2020-10-29 NOTE — Progress Notes (Signed)
Patient presents in nurse clinic for PPD test.  PPD placed in right arm, as last week PPD was placed in left, however she did not come in for read.  Patient to come in on 5/19 after 230pm. Reminder card given.

## 2020-10-31 ENCOUNTER — Other Ambulatory Visit: Payer: Self-pay

## 2020-10-31 ENCOUNTER — Ambulatory Visit (INDEPENDENT_AMBULATORY_CARE_PROVIDER_SITE_OTHER): Payer: Medicare HMO

## 2020-10-31 DIAGNOSIS — Z111 Encounter for screening for respiratory tuberculosis: Secondary | ICD-10-CM

## 2020-10-31 LAB — TB SKIN TEST
Induration: 0 mm
TB Skin Test: NEGATIVE

## 2020-11-04 NOTE — Progress Notes (Signed)
Patient is here for a PPD read.  It was placed on 10/29/2020 in the right forearm @ 2:35 pm.    PPD RESULTS:  Result: negative Induration: 0 mm  Letter created and given to patient for documentation purposes. Veronda Prude, RN

## 2020-11-12 ENCOUNTER — Other Ambulatory Visit: Payer: Self-pay | Admitting: Family Medicine

## 2020-11-12 DIAGNOSIS — E119 Type 2 diabetes mellitus without complications: Secondary | ICD-10-CM

## 2020-11-14 ENCOUNTER — Other Ambulatory Visit: Payer: Self-pay | Admitting: Family Medicine

## 2020-11-23 ENCOUNTER — Other Ambulatory Visit: Payer: Self-pay | Admitting: Family Medicine

## 2020-11-23 DIAGNOSIS — M5442 Lumbago with sciatica, left side: Secondary | ICD-10-CM

## 2020-11-28 ENCOUNTER — Other Ambulatory Visit: Payer: Self-pay | Admitting: Family Medicine

## 2020-11-28 DIAGNOSIS — E119 Type 2 diabetes mellitus without complications: Secondary | ICD-10-CM

## 2020-11-28 DIAGNOSIS — H109 Unspecified conjunctivitis: Secondary | ICD-10-CM

## 2020-12-12 ENCOUNTER — Other Ambulatory Visit: Payer: Self-pay | Admitting: Family Medicine

## 2021-01-28 ENCOUNTER — Telehealth: Payer: Self-pay | Admitting: Family Medicine

## 2021-01-28 NOTE — Telephone Encounter (Signed)
Called patient to schedule AWV, LVM. If patient calls back please assist in scheduling. Any questions please see me. Thanks!

## 2021-02-04 DIAGNOSIS — Z0100 Encounter for examination of eyes and vision without abnormal findings: Secondary | ICD-10-CM | POA: Diagnosis not present

## 2021-02-04 DIAGNOSIS — H52223 Regular astigmatism, bilateral: Secondary | ICD-10-CM | POA: Diagnosis not present

## 2021-02-04 DIAGNOSIS — H16223 Keratoconjunctivitis sicca, not specified as Sjogren's, bilateral: Secondary | ICD-10-CM | POA: Diagnosis not present

## 2021-02-04 DIAGNOSIS — H25011 Cortical age-related cataract, right eye: Secondary | ICD-10-CM | POA: Diagnosis not present

## 2021-02-04 DIAGNOSIS — H5203 Hypermetropia, bilateral: Secondary | ICD-10-CM | POA: Diagnosis not present

## 2021-02-04 DIAGNOSIS — E119 Type 2 diabetes mellitus without complications: Secondary | ICD-10-CM | POA: Diagnosis not present

## 2021-02-04 DIAGNOSIS — H524 Presbyopia: Secondary | ICD-10-CM | POA: Diagnosis not present

## 2021-02-04 LAB — HM DIABETES EYE EXAM

## 2021-02-08 ENCOUNTER — Other Ambulatory Visit: Payer: Self-pay | Admitting: Family Medicine

## 2021-02-08 DIAGNOSIS — F339 Major depressive disorder, recurrent, unspecified: Secondary | ICD-10-CM

## 2021-02-10 NOTE — Patient Instructions (Addendum)
It was great to see you! Thank you for allowing me to participate in your care!  Our plans for today:  -We are checking COVID-19 testing.  We should get the result back in 1 to 2 days and I will let you know the result when it returns. -If you develop any chest pains, trouble breathing, shortness of breath, vomiting to the extent that she cannot keep down fluids, or any other concerning symptoms I would like you to immediately let us know or go to the emergency department.   We are checking some labs today, I will call you if they are abnormal will send you a MyChart message or a letter if they are normal.  If you do not hear about your labs in the next 2 weeks please let us know.  Take care and seek immediate care sooner if you develop any concerns.   Dr. Pharoah Goggins, DO [facility]   

## 2021-02-10 NOTE — Progress Notes (Signed)
    SUBJECTIVE:   CHIEF COMPLAINT / HPI:   Runny nose and congestion with vomiting and diarrhea:  Started 2.5 weeks ago on the 18th. She was initially doing fine but then it worsened three days ago. She has had stomach ache and vomiting which was yesterday and the previous 3 days about twice per day. She has had runny nose and congestion for 2.5 weeks. No shortness of breath. She denies sick contacts. Had a few days of diarrhea which has gotten better and was 2-3 days ago. No fevers, has had a few chills. No muscle aches, no sore throat. Has history of seasonal allergies.  PERTINENT  PMH / PSH: None  OBJECTIVE:   BP (!) 145/72   Pulse 63   Temp 98.8 F (37.1 C) (Oral)   Ht 5\' 6"  (1.676 m)   SpO2 100%   BMI 31.96 kg/m    General: NAD, pleasant, able to participate in exam HEENT: No pharyngeal erythema, moist mucous membranes Cardiac: RRR, no murmurs. Respiratory: CTAB, normal effort, No wheezes, rales or rhonchi Abdomen: Bowel sounds present, nontender Skin: warm and dry, no rashes noted  ASSESSMENT/PLAN:   Viral URI versus gastroenteritis Assessment: 70 y.o. female with congestion and runny nose for about 2 weeks followed by about 4 days of vomiting, diarrhea, sore throat, cough, chills.  She has not had any measured fevers.  She is well-appearing on physical exam in no acute distress but does appear as if she feels ill.  Lungs are clear to auscultation.  She has no signs of strep pharyngitis on physical exam.  She does have a history of seasonal allergies and I believe more than likely the first 2 weeks of symptoms were likely seasonal allergies followed by a contracted illness about 4 to 5 days ago.  Overall differential can include a viral URI which can include COVID-19, or other similar respiratory viruses.  Unlikely bacterial pneumonia with her lungs clear to auscultation.  Other differential can include viral gastroenteritis which may have presented after the patient has dealt  with seasonal allergies for the previous 2 weeks.  She has had vomiting and diarrhea but does not appear dry on physical exam with moist mucous membranes. Plan: -We will send for COVID-19 testing -Discussed return precautions -Discussed symptomatic treatment and the lack of need for antibiotics.  65, DO [facility]

## 2021-02-11 ENCOUNTER — Other Ambulatory Visit: Payer: Self-pay

## 2021-02-11 ENCOUNTER — Ambulatory Visit (INDEPENDENT_AMBULATORY_CARE_PROVIDER_SITE_OTHER): Payer: Medicare HMO | Admitting: Family Medicine

## 2021-02-11 VITALS — BP 145/72 | HR 63 | Temp 98.8°F | Ht 66.0 in

## 2021-02-11 DIAGNOSIS — J069 Acute upper respiratory infection, unspecified: Secondary | ICD-10-CM

## 2021-02-13 LAB — NOVEL CORONAVIRUS, NAA: SARS-CoV-2, NAA: NOT DETECTED

## 2021-02-13 LAB — SARS-COV-2, NAA 2 DAY TAT

## 2021-02-15 ENCOUNTER — Other Ambulatory Visit: Payer: Self-pay | Admitting: Family Medicine

## 2021-02-15 ENCOUNTER — Other Ambulatory Visit: Payer: Self-pay | Admitting: Sports Medicine

## 2021-02-15 DIAGNOSIS — E119 Type 2 diabetes mellitus without complications: Secondary | ICD-10-CM

## 2021-02-15 DIAGNOSIS — M659 Synovitis and tenosynovitis, unspecified: Secondary | ICD-10-CM

## 2021-02-15 DIAGNOSIS — M19031 Primary osteoarthritis, right wrist: Secondary | ICD-10-CM

## 2021-02-20 ENCOUNTER — Encounter: Payer: Self-pay | Admitting: Family Medicine

## 2021-02-20 ENCOUNTER — Ambulatory Visit (INDEPENDENT_AMBULATORY_CARE_PROVIDER_SITE_OTHER): Payer: Medicare HMO | Admitting: Family Medicine

## 2021-02-20 ENCOUNTER — Other Ambulatory Visit: Payer: Self-pay

## 2021-02-20 VITALS — BP 136/64 | HR 56 | Ht 66.0 in | Wt 178.4 lb

## 2021-02-20 DIAGNOSIS — E038 Other specified hypothyroidism: Secondary | ICD-10-CM | POA: Diagnosis not present

## 2021-02-20 DIAGNOSIS — Z23 Encounter for immunization: Secondary | ICD-10-CM

## 2021-02-20 DIAGNOSIS — M5442 Lumbago with sciatica, left side: Secondary | ICD-10-CM | POA: Diagnosis not present

## 2021-02-20 DIAGNOSIS — E119 Type 2 diabetes mellitus without complications: Secondary | ICD-10-CM | POA: Diagnosis not present

## 2021-02-20 DIAGNOSIS — R3915 Urgency of urination: Secondary | ICD-10-CM | POA: Diagnosis not present

## 2021-02-20 LAB — POCT GLYCOSYLATED HEMOGLOBIN (HGB A1C): HbA1c, POC (controlled diabetic range): 7.2 % — AB (ref 0.0–7.0)

## 2021-02-20 MED ORDER — SHINGRIX 50 MCG/0.5ML IM SUSR
0.5000 mL | Freq: Once | INTRAMUSCULAR | 0 refills | Status: AC
Start: 2021-02-20 — End: 2021-02-20

## 2021-02-20 MED ORDER — METFORMIN HCL 500 MG PO TABS: 1000.0000 mg | ORAL_TABLET | Freq: Every day | ORAL | 0 refills | Status: DC

## 2021-02-20 NOTE — Progress Notes (Signed)
    SUBJECTIVE:   CHIEF COMPLAINT / HPI:   Ms. Shevchenko is a 70 yo F who presents for the below.   Diabetes Current Regimen: Metformin 1000 mg daily CBGs: Checks twice monthly most recently 134 Last A1c: 8.7 on 01/16/2020 Denies polyuria, polydipsia, hypoglycemia Last Eye Exam: 01/26/2020 Statin: Atorvastatin 40 mg daily ACE/ARB: Lisinopril 10 mg daily  HM Shingrix-we will print prescription for pharmacy COVID-declines booster TSH-3.8 on 01/16/2020  Of note, patient mentioned 2 months of increased urgency to urinate.  Denies incontinence.  Endorses increased liquid intake with water, chocolate milk and RC cola.  Endorses constipation for the past 6 days that was recently relieved by laxative.  Patient admits that she has not felt urinary urgency since having a bowel movement. Denies dysuria and fever.   PERTINENT  PMH / PSH: Hypothyroidism, history of diverticulosis, HLD  OBJECTIVE:   BP 136/64   Pulse (!) 56   Ht 5\' 6"  (1.676 m)   Wt 178 lb 6.4 oz (80.9 kg)   SpO2 97%   BMI 28.79 kg/m   Physical Exam Vitals reviewed.  Constitutional:      General: She is not in acute distress.    Appearance: She is not ill-appearing, toxic-appearing or diaphoretic.  Cardiovascular:     Rate and Rhythm: Normal rate and regular rhythm.     Heart sounds: Normal heart sounds.  Pulmonary:     Effort: Pulmonary effort is normal.     Breath sounds: Normal breath sounds.  Abdominal:     General: Bowel sounds are normal. There is no distension.     Palpations: Abdomen is soft.     Tenderness: There is no abdominal tenderness. There is no right CVA tenderness, left CVA tenderness or guarding.     Comments: No suprapubic tenderness.   Neurological:     Mental Status: She is alert.     ASSESSMENT/PLAN:   1. Type 2 diabetes mellitus without complication, without long-term current use of insulin (HCC) Improved A1c to 7.2. Patient has also lost weight which is contributory as well. Metformin  was written for BID. Patient taken daily. Would not change at this time. Follow up in 3 months for repeat.  - HgB A1c - metFORMIN (GLUCOPHAGE) 500 MG tablet; Take 2 tablets (1,000 mg total) by mouth daily with breakfast.  Dispense: 180 tablet; Refill: 0 - Basic Metabolic Panel  2. Urgency of urination Resolved with bowel movement. No concern for incontinence at this time. Discussed regular bowel movements and regular dieting to produce regular bowel movement. Follow up if reoccurs.   3. Other specified hypothyroidism - repeat TSH  4. Need for vaccination - Zoster Vaccine Adjuvanted [facility]) injection; Inject 0.5 mLs into the muscle once for 1 dose.  Dispense: 0.5 mL; Refill: 0  [facility], DO [place] [facility]

## 2021-02-20 NOTE — Patient Instructions (Addendum)
Thank you for coming in today.  We discussed your diabetes which has improved.  Good job.  Continue your current regimen.  We will follow-up in 3 months. We also discussed the increase in urgency this is since resolved with bowel movements.  Please let me know if this returns.  Dr. Salvadore Dom

## 2021-02-21 LAB — BASIC METABOLIC PANEL
BUN/Creatinine Ratio: 14 (ref 12–28)
BUN: 16 mg/dL (ref 8–27)
CO2: 24 mmol/L (ref 20–29)
Calcium: 9.6 mg/dL (ref 8.7–10.3)
Chloride: 103 mmol/L (ref 96–106)
Creatinine, Ser: 1.11 mg/dL — ABNORMAL HIGH (ref 0.57–1.00)
Glucose: 90 mg/dL (ref 65–99)
Potassium: 3.9 mmol/L (ref 3.5–5.2)
Sodium: 144 mmol/L (ref 134–144)
eGFR: 53 mL/min/{1.73_m2} — ABNORMAL LOW (ref >59)

## 2021-02-21 LAB — TSH: TSH: 2.62 u[IU]/mL (ref 0.450–4.500)

## 2021-02-24 ENCOUNTER — Encounter: Payer: Self-pay | Admitting: Family Medicine

## 2021-02-26 ENCOUNTER — Other Ambulatory Visit: Payer: Self-pay | Admitting: Family Medicine

## 2021-02-26 DIAGNOSIS — Z1231 Encounter for screening mammogram for malignant neoplasm of breast: Secondary | ICD-10-CM

## 2021-03-30 ENCOUNTER — Other Ambulatory Visit: Payer: Self-pay | Admitting: Family Medicine

## 2021-03-30 DIAGNOSIS — M19031 Primary osteoarthritis, right wrist: Secondary | ICD-10-CM

## 2021-03-30 DIAGNOSIS — M659 Synovitis and tenosynovitis, unspecified: Secondary | ICD-10-CM

## 2021-04-02 ENCOUNTER — Other Ambulatory Visit: Payer: Self-pay

## 2021-04-02 ENCOUNTER — Ambulatory Visit
Admission: RE | Admit: 2021-04-02 | Discharge: 2021-04-02 | Disposition: A | Discharge status: Home / Self Care | Payer: Medicare HMO | Source: Ambulatory Visit | Attending: *Deleted | Admitting: *Deleted

## 2021-04-02 DIAGNOSIS — Z1231 Encounter for screening mammogram for malignant neoplasm of breast: Secondary | ICD-10-CM | POA: Diagnosis not present

## 2021-04-08 ENCOUNTER — Other Ambulatory Visit: Payer: Self-pay | Admitting: *Deleted

## 2021-04-08 DIAGNOSIS — R928 Other abnormal and inconclusive findings on diagnostic imaging of breast: Secondary | ICD-10-CM

## 2021-04-09 ENCOUNTER — Other Ambulatory Visit: Payer: Self-pay | Admitting: Family Medicine

## 2021-04-09 DIAGNOSIS — R928 Other abnormal and inconclusive findings on diagnostic imaging of breast: Secondary | ICD-10-CM

## 2021-04-15 DIAGNOSIS — E785 Hyperlipidemia, unspecified: Secondary | ICD-10-CM | POA: Diagnosis not present

## 2021-04-15 DIAGNOSIS — G8929 Other chronic pain: Secondary | ICD-10-CM | POA: Diagnosis not present

## 2021-04-15 DIAGNOSIS — E119 Type 2 diabetes mellitus without complications: Secondary | ICD-10-CM | POA: Diagnosis not present

## 2021-04-15 DIAGNOSIS — R69 Illness, unspecified: Secondary | ICD-10-CM | POA: Diagnosis not present

## 2021-04-15 DIAGNOSIS — Z8249 Family history of ischemic heart disease and other diseases of the circulatory system: Secondary | ICD-10-CM | POA: Diagnosis not present

## 2021-04-15 DIAGNOSIS — I10 Essential (primary) hypertension: Secondary | ICD-10-CM | POA: Diagnosis not present

## 2021-04-15 DIAGNOSIS — Z008 Encounter for other general examination: Secondary | ICD-10-CM | POA: Diagnosis not present

## 2021-04-15 DIAGNOSIS — M2550 Pain in unspecified joint: Secondary | ICD-10-CM | POA: Diagnosis not present

## 2021-04-15 DIAGNOSIS — E039 Hypothyroidism, unspecified: Secondary | ICD-10-CM | POA: Diagnosis not present

## 2021-04-15 DIAGNOSIS — Z7984 Long term (current) use of oral hypoglycemic drugs: Secondary | ICD-10-CM | POA: Diagnosis not present

## 2021-04-15 DIAGNOSIS — Z833 Family history of diabetes mellitus: Secondary | ICD-10-CM | POA: Diagnosis not present

## 2021-04-19 ENCOUNTER — Ambulatory Visit
Admission: RE | Admit: 2021-04-19 | Discharge: 2021-04-19 | Disposition: A | Discharge status: Home / Self Care | Payer: Medicare HMO | Source: Ambulatory Visit | Attending: Family Medicine | Admitting: Family Medicine

## 2021-04-19 ENCOUNTER — Other Ambulatory Visit: Payer: Self-pay

## 2021-04-19 DIAGNOSIS — R922 Inconclusive mammogram: Secondary | ICD-10-CM | POA: Diagnosis not present

## 2021-04-19 DIAGNOSIS — R928 Other abnormal and inconclusive findings on diagnostic imaging of breast: Secondary | ICD-10-CM

## 2021-07-04 ENCOUNTER — Other Ambulatory Visit: Payer: Self-pay | Admitting: Family Medicine

## 2021-07-04 DIAGNOSIS — E119 Type 2 diabetes mellitus without complications: Secondary | ICD-10-CM

## 2021-07-07 ENCOUNTER — Other Ambulatory Visit: Payer: Self-pay

## 2021-07-07 ENCOUNTER — Ambulatory Visit (INDEPENDENT_AMBULATORY_CARE_PROVIDER_SITE_OTHER): Payer: Medicare HMO | Admitting: Family Medicine

## 2021-07-07 VITALS — BP 138/60 | HR 58 | Ht 66.0 in | Wt 176.4 lb

## 2021-07-07 DIAGNOSIS — R634 Abnormal weight loss: Secondary | ICD-10-CM | POA: Diagnosis not present

## 2021-07-07 DIAGNOSIS — E162 Hypoglycemia, unspecified: Secondary | ICD-10-CM | POA: Diagnosis not present

## 2021-07-07 DIAGNOSIS — E119 Type 2 diabetes mellitus without complications: Secondary | ICD-10-CM | POA: Diagnosis not present

## 2021-07-07 LAB — POCT GLYCOSYLATED HEMOGLOBIN (HGB A1C): HbA1c, POC (controlled diabetic range): 6.8 % (ref 0.0–7.0)

## 2021-07-07 MED ORDER — GLUCOSE 4 G PO CHEW: 1.0000 | CHEWABLE_TABLET | ORAL | 12 refills | Status: AC | PRN

## 2021-07-07 NOTE — Progress Notes (Signed)
SUBJECTIVE:   CHIEF COMPLAINT / HPI:   Candice Warner is a 71 yo F who presents for the below.   Diabetes with hypoglycemic episodes She has been having hypoglycemic episodes for years now.  She previously worked at [employer] and they helped her monitor it.  During these low blood sugar episodes they would give her Pepsi or candy and it would resolve.  Recently her blood sugar has been dropping at night; over the last 2 months she has had 3 episodes.  The most recent episode was 1 week ago and lasted for 45 minutes which this is the longest is ever lasted and she felt tired afterwards.  She states that she wakes up in the middle the night and she is cold and shivering.  She eats or drinks something sugary and it resolves.  She does have a history of decreased intake before bed.  Denies family history of seizures.  Of note daughter recently admitted to hospital 1 week ago for seizure and found to have an intracranial mass. Current Regimen: Metformin 1000 mg daily CBGs: Does not check regularly and states that she has not checked during the hypoglycemic episodes because she is shaking too much Last A1c: 7.2 on 02/20/2021  Last Eye Exam: 02/04/2021; no retinopathy Statin: Lipitor 40 mg daily ACE/ARB: Lisinopril 10 mg daily  Bilateral leg pain Experiencing 3 weeks of bilateral posterior leg pain at the level of the hamstrings.  Patient states pain is usually worse in the morning but most recently when she was walking into the hospital to see her daughter.  She denies known injury, radiation, numbness/tingling, history of DVTs, history of recent long trips.  PERTINENT  PMH / PSH: HTN, hypothyroidism, DJD, OA, HLD  OBJECTIVE:   BP 138/60    Pulse (!) 58    Ht 5\' 6"  (1.676 m)    Wt 176 lb 6.4 oz (80 kg)    SpO2 98%    BMI 28.47 kg/m   Physical Exam Vitals reviewed.  Constitutional:      General: She is not in acute distress.    Appearance: Normal appearance. She is not ill-appearing, toxic-appearing  or diaphoretic.  Cardiovascular:     Rate and Rhythm: Normal rate and regular rhythm.  Pulmonary:     Effort: Pulmonary effort is normal.     Breath sounds: Normal breath sounds.  Musculoskeletal:     Right upper leg: Tenderness present. No swelling, edema or deformity.     Left upper leg: Tenderness present. No swelling, edema or deformity.     Right lower leg: Normal. No swelling or tenderness. No edema.     Left lower leg: Normal. No swelling or tenderness. No edema.     Comments: No deformity. Decreased ROM bilaterally with SLR flexion at the hip and FABER stretch with 5/5 strength. TTP at the level of hamstring muscles which have hypertonicity.    Neurological:     Mental Status: She is alert and oriented to person, place, and time.     Sensory: No sensory deficit.     Motor: No weakness.     Gait: Gait normal.  Psychiatric:        Mood and Affect: Mood normal.        Behavior: Behavior normal.   ASSESSMENT/PLAN:   1. Type 2 diabetes mellitus without complication, without long-term current use of insulin (HCC) Well-controlled. A1c 6.8.  With goal of 7-8. Discontinue metformin in light of hypoglycemic episodes.  Glucose tablets  prescribed as below.   2. Unintentional weight loss 22 pound weight lost over 1 year.  Patient unaware.  Denies fevers, night sweats, recent infection/illness.  Discussed obtaining labs. Concern for malignancy. She is UTD with colon cancer screening (last 2018 and wnl), mammogram (last 04/19/2021 and no evidence of malignancy); last pap 09/13/2015 an negative for malignancy. Will obtain infection labs and TSH (hx of hypothyroidism taking synthroid last TSH 2.6).  - CBC with Differential - Comprehensive metabolic panel - TSH - RPR - HIV antibody (with reflex) - Follow up in 2-4 weeks  3. Hypoglycemia Prior history as documented 3 years ago.  Well-controlled A1c today.  Discontinue metformin.  Encouraged to check blood sugars when she starts to feel low.   Instructed to call office if she continues to have this episodes.  Also encouraged eating regular meals to maintain a normal blood sugar.  Sugar tablets also given her CBGs lower than 70-75 and/or symptoms of hypoglycemia.  - glucose 4 GM chewable tablet; Chew 1 tablet (4 g total) by mouth as needed for low blood sugar.  Dispense: 50 tablet; Refill: 12 -Follow up in 2-4 weeks   Gerlene Fee, [place]

## 2021-07-07 NOTE — Patient Instructions (Addendum)
Thank for coming in today.  We discussed low blood sugar.  I would like for you to stop taking metformin at this time.  If you continue to have these episodes please check your blood sugar and call the office for further instructions.  When blood work results are available I will notify you.  Please follow-up in 2 to 4 weeks.  We also discussed posterior leg pain.  Your hamstrings appear to be very tight.  Below are some stretches to help with this.    Hamstring Strain Rehab Ask your health care provider which exercises are safe for you. Do exercises exactly as told by your health care provider and adjust them as directed. It is normal to feel mild stretching, pulling, tightness, or discomfort as you do these exercises. Stop right away if you feel sudden pain or your pain gets worse. Do not begin these exercises until told by your health care provider. Stretching and range-of-motion exercises These exercises warm up your muscles and joints and improve the movement and flexibility of your thighs. These exercises also help to relieve pain, numbness, and tingling. Talk to your health care provider about these restrictions. Knee extension, seated  Sit with your left / right heel propped on a chair, a coffee table, or a footstool. Do not have anything under your knee to support it. Allow your leg muscles to relax, letting gravity straighten out your knee (extension). You should feel a stretch behind your left / right knee. If told by your health care provider, deepen the stretch by placing a __________ weight on your thigh, just above your kneecap. Hold this position for __________ seconds. Repeat __________ times. Complete this exercise __________ times a day. Seated stretch This exercise is sometimes called hamstrings and adductors stretch. Sit on the floor with your legs stretched wide. Keep your knees straight during this exercise. Keeping your head and back in a straight line, bend at your waist  to reach for your left foot. You should feel a stretch in your right inner thigh (adductors). Hold this position for __________ seconds. Then slowly return to the upright position. Keeping your head and back in a straight line, bend at your waist to reach forward. You should feel a stretch behind both of your thighs or knees (hamstrings). Hold this position for __________ seconds. Then slowly return to the upright position. Keeping your head and back in a straight line, bend at your waist to reach for your right foot. You should feel a stretch in your left inner thigh (adductors). Hold this position for __________ seconds. Then slowly return to the upright position. Repeat __________ times. Complete this exercise __________ times a day. Hamstrings stretch, supine  Lie on your back (supine position). Loop a belt or towel over the ball of your left / right foot. The ball of your foot is on the walking surface, right under your toes. Straighten your left / right knee and slowly pull on the belt or towel to raise your leg. Do not let your left / right knee bend while you do this. Keep your other leg flat on the floor. Raise the left / right leg until you feel a gentle stretch behind your left / right knee or thigh (hamstrings). Hold this position for __________ seconds. Slowly return your leg to the starting position. Repeat __________ times. Complete this exercise __________ times a day. Strengthening exercises These exercises build strength and endurance in your thighs. Endurance is the ability to use your muscles  for a long time, even after they get tired. Straight leg raises, prone This exercise strengthens the muscles that move the hips (hip extensors). Lie on your abdomen on a firm surface (prone position). Tense the muscles in your buttocks and lift your left / right leg about 4 inches (10 cm). Keep your knee straight as you lift your leg. If you cannot lift your leg that high without  arching your back, place a pillow under your hips. Hold the position for __________ seconds. Slowly lower your leg to the starting position. Allow your muscles to relax completely before you start the next repetition. Repeat __________ times. Complete this exercise __________ times a day. Bridge This exercise strengthens the muscles in your buttocks and the back of your thighs (hip extensors). Lie on your back on a firm surface with your knees bent and your feet flat on the floor. Tighten your buttocks muscles and lift your bottom off the floor until the trunk of your body is level with your thighs. You should feel the muscles working in your buttocks and the back of your thighs. Do not arch your back. Hold this position for __________ seconds. Slowly lower your hips to the starting position. Let your buttocks muscles relax completely between repetitions. If told by your health care provider, keep your bottom lifted off the floor while you slowly walk your feet away from you as far as you can control. Hold for __________ seconds, then slowly walk your feet back toward you. Repeat __________ times. Complete this exercise __________ times a day. Lateral walking with band This is an exercise in which you walk sideways (lateral), with tension provided by an exercise band. The exercise strengthens the muscles in your hip (hip abductors). Stand in a long hallway. Wrap a loop of exercise band around your legs, just above your knees. Bend your knees gently and drop your hips down and back so your weight is over your heels. Step to the side to move down the length of the hallway, keeping your toes pointed ahead of you and keeping tension in the band. Repeat, leading with your other leg. Repeat __________ times. Complete this exercise __________ times a day. Single leg stand with reaching This exercise is also called eccentric hamstring stretch. Stand on your left / right foot. Keep your big toe down  on the floor and try to keep your arch lifted. Slowly reach down toward the floor as far as you can while keeping your balance. Lowering your thigh under tension is called eccentric stretching. Hold this position for __________ seconds. Repeat __________ times. Complete this exercise __________ times a day. Plank, prone This exercise strengthens muscles in your abdomen and core area. Lie on your abdomen on the floor (prone position),and prop yourself up on your elbows. Your hands should be straight out in front of you, and your elbows should be below your shoulders. Position your feet similar to a push-up position so your toes are on the ground. Tighten your abdominal muscles and lift your body off the floor. Do not arch your back. Do not hold your breath. Hold this position for __________ seconds. Repeat __________ times. Complete this exercise __________ times a day. This information is not intended to replace advice given to you by your health care provider. Make sure you discuss any questions you have with your health care provider. Document Revised: 11/25/2020 Document Reviewed: 11/25/2020 Elsevier Patient Education  2022 [employer].

## 2021-07-08 ENCOUNTER — Telehealth: Payer: Self-pay | Admitting: Family Medicine

## 2021-07-08 DIAGNOSIS — H109 Unspecified conjunctivitis: Secondary | ICD-10-CM

## 2021-07-08 DIAGNOSIS — F339 Major depressive disorder, recurrent, unspecified: Secondary | ICD-10-CM

## 2021-07-08 DIAGNOSIS — M5442 Lumbago with sciatica, left side: Secondary | ICD-10-CM

## 2021-07-08 LAB — CBC WITH DIFFERENTIAL/PLATELET
Basophils Absolute: 0 10*3/uL (ref 0.0–0.2)
Basos: 1 %
EOS (ABSOLUTE): 0.2 10*3/uL (ref 0.0–0.4)
Eos: 3 %
Hematocrit: 34.8 % (ref 34.0–46.6)
Hemoglobin: 11.3 g/dL (ref 11.1–15.9)
Immature Grans (Abs): 0 10*3/uL (ref 0.0–0.1)
Immature Granulocytes: 0 %
Lymphocytes Absolute: 2.3 10*3/uL (ref 0.7–3.1)
Lymphs: 38 %
MCH: 27 pg (ref 26.6–33.0)
MCHC: 32.5 g/dL (ref 31.5–35.7)
MCV: 83 fL (ref 79–97)
Monocytes Absolute: 0.4 10*3/uL (ref 0.1–0.9)
Monocytes: 6 %
Neutrophils Absolute: 3.1 10*3/uL (ref 1.4–7.0)
Neutrophils: 52 %
Platelets: 268 10*3/uL (ref 150–450)
RBC: 4.19 x10E6/uL (ref 3.77–5.28)
RDW: 14 % (ref 11.7–15.4)
WBC: 6 10*3/uL (ref 3.4–10.8)

## 2021-07-08 LAB — COMPREHENSIVE METABOLIC PANEL
ALT: 12 IU/L (ref 0–32)
AST: 14 IU/L (ref 0–40)
Albumin/Globulin Ratio: 1.3 (ref 1.2–2.2)
Albumin: 4.3 g/dL (ref 3.7–4.7)
Alkaline Phosphatase: 78 IU/L (ref 44–121)
BUN/Creatinine Ratio: 16 (ref 12–28)
BUN: 17 mg/dL (ref 8–27)
Bilirubin Total: 0.6 mg/dL (ref 0.0–1.2)
CO2: 25 mmol/L (ref 20–29)
Calcium: 9.4 mg/dL (ref 8.7–10.3)
Chloride: 104 mmol/L (ref 96–106)
Creatinine, Ser: 1.05 mg/dL — ABNORMAL HIGH (ref 0.57–1.00)
Globulin, Total: 3.2 g/dL (ref 1.5–4.5)
Glucose: 96 mg/dL (ref 70–99)
Potassium: 4.1 mmol/L (ref 3.5–5.2)
Sodium: 145 mmol/L — ABNORMAL HIGH (ref 134–144)
Total Protein: 7.5 g/dL (ref 6.0–8.5)
eGFR: 57 mL/min/{1.73_m2} — ABNORMAL LOW (ref >59)

## 2021-07-08 LAB — RPR: RPR Ser Ql: NONREACTIVE

## 2021-07-08 LAB — TSH: TSH: 2.27 u[IU]/mL (ref 0.450–4.500)

## 2021-07-08 LAB — HIV ANTIBODY (ROUTINE TESTING W REFLEX): HIV Screen 4th Generation wRfx: NONREACTIVE

## 2021-07-08 MED ORDER — LISINOPRIL 10 MG PO TABS: 10.0000 mg | ORAL_TABLET | Freq: Every day | ORAL | 0 refills | Status: DC

## 2021-07-08 MED ORDER — GABAPENTIN 100 MG PO CAPS: 100.0000 mg | ORAL_CAPSULE | Freq: Every day | ORAL | 0 refills | Status: DC

## 2021-07-08 MED ORDER — SERTRALINE HCL 100 MG PO TABS: 50.0000 mg | ORAL_TABLET | Freq: Every day | ORAL | 1 refills | Status: DC

## 2021-07-08 MED ORDER — LORATADINE 10 MG PO TABS: 10.0000 mg | ORAL_TABLET | Freq: Every day | ORAL | 0 refills | Status: DC

## 2021-07-08 MED ORDER — LEVOTHYROXINE SODIUM 50 MCG PO TABS: 50.0000 ug | ORAL_TABLET | Freq: Every day | ORAL | 0 refills | Status: DC

## 2021-07-08 NOTE — Telephone Encounter (Signed)
Called patient with normal results. Will refill medications as previously discussed. Patient is following up with me february.   Lavonda Jumbo, DO 07/08/2021, 5:37 PM PGY-3, [facility]

## 2021-07-10 ENCOUNTER — Other Ambulatory Visit: Payer: Self-pay | Admitting: Family Medicine

## 2021-08-03 ENCOUNTER — Other Ambulatory Visit: Payer: Self-pay | Admitting: Family Medicine

## 2021-08-03 DIAGNOSIS — E119 Type 2 diabetes mellitus without complications: Secondary | ICD-10-CM

## 2021-08-07 ENCOUNTER — Other Ambulatory Visit: Payer: Self-pay

## 2021-08-07 ENCOUNTER — Encounter: Payer: Self-pay | Admitting: Family Medicine

## 2021-08-07 ENCOUNTER — Ambulatory Visit (INDEPENDENT_AMBULATORY_CARE_PROVIDER_SITE_OTHER): Payer: Medicare HMO | Admitting: Family Medicine

## 2021-08-07 VITALS — BP 165/66 | HR 61 | Ht 66.5 in | Wt 176.8 lb

## 2021-08-07 DIAGNOSIS — I10 Essential (primary) hypertension: Secondary | ICD-10-CM

## 2021-08-07 DIAGNOSIS — E119 Type 2 diabetes mellitus without complications: Secondary | ICD-10-CM

## 2021-08-07 MED ORDER — ONETOUCH ULTRASOFT LANCETS MISC: 6 refills | Status: DC

## 2021-08-07 MED ORDER — ONETOUCH VERIO VI STRP: ORAL_STRIP | 12 refills | Status: DC

## 2021-08-07 NOTE — Progress Notes (Signed)
° ° °  SUBJECTIVE:   CHIEF COMPLAINT / HPI:   Diabetes Current Regimen: diet controlled CBGs: 98 this morning, has not been lower  Last A1c: 6.8 on 07/07/21  Denies polyuria, polydipsia, hypoglycemia Statin: lipitor 40 mg ACE/ARB: lisinopril 10 mg daily  Hypertension: - Medications: Lisinopril 10 mg daily - Compliance: Yes - Checking BP at home: 140-170/60 - Denies any SOB, CP, vision changes, LE edema, medication SEs, or symptoms of hypotension  PERTINENT  PMH / PSH: Hypoglycemia, hypothyroidism, HLD  OBJECTIVE:   BP (!) 165/66    Pulse 61    Ht 5' 6.5" (1.689 m)    Wt 80.2 kg    SpO2 100%    BMI 28.11 kg/m   Physical Exam Vitals reviewed.  Constitutional:      General: She is not in acute distress.    Appearance: She is not ill-appearing, toxic-appearing or diaphoretic.  Cardiovascular:     Rate and Rhythm: Normal rate and regular rhythm.     Heart sounds: Normal heart sounds.  Pulmonary:     Effort: Pulmonary effort is normal.     Breath sounds: Normal breath sounds.  Neurological:     Mental Status: She is alert and oriented to person, place, and time.  Psychiatric:        Mood and Affect: Mood normal.        Behavior: Behavior normal.     ASSESSMENT/PLAN:   1. Type 2 diabetes mellitus without complication, without long-term current use of insulin (HCC) Follow up in 2 months for repeat A1c and if no more hypoglycemic episodes can stop checking glucose - glucose blood (ONETOUCH VERIO) test strip; Use as instructed  Dispense: 100 each; Refill: 12 - Lancets (ONETOUCH ULTRASOFT) lancets; Use as instructed  Dispense: 100 each; Refill: 6  2. HYPERTENSION, BENIGN SYSTEMIC Elevated. Asymptomatic. States its always elevated in the office but BP reading at home elevated. Given parameters to call if remains elevated. Consider increasing lisinopril at follow up.   Gerlene Fee, [place]

## 2021-08-07 NOTE — Patient Instructions (Signed)
It was wonderful to see you today.  Please bring ALL of your medications with you to every visit.   Today we talked about:  Blood pressure-If continues to be >140/90 give [place] a call.  Blood sugar- doing well. Follow up for repeat A1c in 2 months.  Please be sure to schedule follow up at the front  desk before you leave today.   If you haven't already, sign up for My Chart to have easy access to your labs results, and communication with your primary care physician.  Please call the clinic at [phone] if your symptoms worsen or you have any concerns. It was our pleasure to serve you.  Dr. Salvadore Dom

## 2021-08-30 DIAGNOSIS — R112 Nausea with vomiting, unspecified: Secondary | ICD-10-CM | POA: Diagnosis not present

## 2021-08-30 DIAGNOSIS — R8789 Other abnormal findings in specimens from female genital organs: Secondary | ICD-10-CM | POA: Diagnosis not present

## 2021-08-30 DIAGNOSIS — N200 Calculus of kidney: Secondary | ICD-10-CM | POA: Diagnosis not present

## 2021-08-30 DIAGNOSIS — N12 Tubulo-interstitial nephritis, not specified as acute or chronic: Secondary | ICD-10-CM | POA: Diagnosis not present

## 2021-08-30 DIAGNOSIS — I10 Essential (primary) hypertension: Secondary | ICD-10-CM | POA: Diagnosis not present

## 2021-08-30 DIAGNOSIS — K8021 Calculus of gallbladder without cholecystitis with obstruction: Secondary | ICD-10-CM | POA: Diagnosis not present

## 2021-08-30 DIAGNOSIS — E119 Type 2 diabetes mellitus without complications: Secondary | ICD-10-CM | POA: Diagnosis not present

## 2021-08-30 DIAGNOSIS — R1031 Right lower quadrant pain: Secondary | ICD-10-CM | POA: Diagnosis not present

## 2021-08-30 DIAGNOSIS — E785 Hyperlipidemia, unspecified: Secondary | ICD-10-CM | POA: Diagnosis not present

## 2021-08-30 DIAGNOSIS — N10 Acute tubulo-interstitial nephritis: Secondary | ICD-10-CM | POA: Diagnosis not present

## 2021-08-30 DIAGNOSIS — E876 Hypokalemia: Secondary | ICD-10-CM | POA: Diagnosis not present

## 2021-08-30 DIAGNOSIS — N179 Acute kidney failure, unspecified: Secondary | ICD-10-CM | POA: Diagnosis not present

## 2021-08-30 DIAGNOSIS — K8020 Calculus of gallbladder without cholecystitis without obstruction: Secondary | ICD-10-CM | POA: Diagnosis not present

## 2021-08-30 DIAGNOSIS — D649 Anemia, unspecified: Secondary | ICD-10-CM | POA: Diagnosis not present

## 2021-08-30 DIAGNOSIS — R1032 Left lower quadrant pain: Secondary | ICD-10-CM | POA: Diagnosis not present

## 2021-08-30 DIAGNOSIS — R1084 Generalized abdominal pain: Secondary | ICD-10-CM | POA: Diagnosis not present

## 2021-08-31 DIAGNOSIS — N10 Acute tubulo-interstitial nephritis: Secondary | ICD-10-CM | POA: Diagnosis not present

## 2021-08-31 DIAGNOSIS — E119 Type 2 diabetes mellitus without complications: Secondary | ICD-10-CM | POA: Diagnosis not present

## 2021-08-31 DIAGNOSIS — R8789 Other abnormal findings in specimens from female genital organs: Secondary | ICD-10-CM | POA: Diagnosis not present

## 2021-08-31 DIAGNOSIS — K8021 Calculus of gallbladder without cholecystitis with obstruction: Secondary | ICD-10-CM | POA: Diagnosis not present

## 2021-08-31 DIAGNOSIS — N200 Calculus of kidney: Secondary | ICD-10-CM | POA: Diagnosis not present

## 2021-08-31 DIAGNOSIS — D649 Anemia, unspecified: Secondary | ICD-10-CM | POA: Diagnosis not present

## 2021-08-31 DIAGNOSIS — E876 Hypokalemia: Secondary | ICD-10-CM | POA: Diagnosis not present

## 2021-09-01 DIAGNOSIS — I10 Essential (primary) hypertension: Secondary | ICD-10-CM | POA: Diagnosis not present

## 2021-09-01 DIAGNOSIS — R001 Bradycardia, unspecified: Secondary | ICD-10-CM | POA: Diagnosis not present

## 2021-09-01 DIAGNOSIS — N859 Noninflammatory disorder of uterus, unspecified: Secondary | ICD-10-CM | POA: Diagnosis not present

## 2021-09-01 DIAGNOSIS — Z7984 Long term (current) use of oral hypoglycemic drugs: Secondary | ICD-10-CM | POA: Diagnosis not present

## 2021-09-01 DIAGNOSIS — K8020 Calculus of gallbladder without cholecystitis without obstruction: Secondary | ICD-10-CM | POA: Diagnosis not present

## 2021-09-01 DIAGNOSIS — D649 Anemia, unspecified: Secondary | ICD-10-CM | POA: Diagnosis not present

## 2021-09-01 DIAGNOSIS — E119 Type 2 diabetes mellitus without complications: Secondary | ICD-10-CM | POA: Diagnosis not present

## 2021-09-01 DIAGNOSIS — N10 Acute tubulo-interstitial nephritis: Secondary | ICD-10-CM | POA: Diagnosis not present

## 2021-09-03 ENCOUNTER — Ambulatory Visit (INDEPENDENT_AMBULATORY_CARE_PROVIDER_SITE_OTHER): Payer: Medicare HMO | Admitting: Family Medicine

## 2021-09-03 ENCOUNTER — Other Ambulatory Visit: Payer: Self-pay

## 2021-09-03 ENCOUNTER — Encounter: Payer: Self-pay | Admitting: Family Medicine

## 2021-09-03 VITALS — BP 146/71 | HR 55 | Wt 179.6 lb

## 2021-09-03 DIAGNOSIS — Z09 Encounter for follow-up examination after completed treatment for conditions other than malignant neoplasm: Secondary | ICD-10-CM

## 2021-09-03 DIAGNOSIS — Z87448 Personal history of other diseases of urinary system: Secondary | ICD-10-CM | POA: Diagnosis not present

## 2021-09-03 DIAGNOSIS — R7989 Other specified abnormal findings of blood chemistry: Secondary | ICD-10-CM

## 2021-09-03 DIAGNOSIS — I10 Essential (primary) hypertension: Secondary | ICD-10-CM

## 2021-09-03 NOTE — Progress Notes (Signed)
? ? ?  SUBJECTIVE:  ? ?CHIEF COMPLAINT / HPI:  ? ?Hospital follow up, hx of pyelonephritis ?[facility] and lower back pain and was admitted for pyelonephritis +E.Coli given IV antibiotics. Her symptoms at the time was emesis, diarrhea, low back pain, and leg pain. She continues to have lower back and leg pain but endorses significant improvement since hospitalization. She has taken advil with some relief. She continue to take her oral antibiotic as prescribed.  Denies fever, nausea, vomiting. ? ?Hypertension: ?- Medications: Lisinopril 10 mg  ?- Compliance: Yes ?- Checking BP at home: Yes SBP 120s ?- Denies any SOB, CP, vision changes, LE edema, medication SEs, or symptoms of hypotension ? ?PERTINENT  PMH / PSH: T2DM (A1c 6.8 07/07/21) ? ?OBJECTIVE:  ? ?BP (!) 146/71   Pulse (!) 55   Wt 179 lb 9.6 oz (81.5 kg)   SpO2 100%   BMI 28.55 kg/m?  ?  ?Physical Exam ?Vitals reviewed.  ?Constitutional:   ?   General: She is not in acute distress. ?   Appearance: She is not ill-appearing, toxic-appearing or diaphoretic.  ?Cardiovascular:  ?   Rate and Rhythm: Normal rate and regular rhythm.  ?Pulmonary:  ?   Effort: Pulmonary effort is normal.  ?   Breath sounds: Normal breath sounds.  ?Abdominal:  ?   General: There is no distension.  ?   Palpations: Abdomen is soft. There is no mass.  ?   Tenderness: There is no abdominal tenderness. There is no right CVA tenderness, left CVA tenderness, guarding or rebound.  ?Neurological:  ?   Mental Status: She is alert and oriented to person, place, and time.  ?   Gait: Gait normal.  ?Psychiatric:     ?   Mood and Affect: Mood normal.     ?   Behavior: Behavior normal.  ? ?ASSESSMENT/PLAN:  ? ?Hospital discharge follow-up  History of pyelonephritis ?Reviewed in ED encounter.  Admitted 3/18 - 3/20 for pyelonephritis + E. coli.  Sensitivities not available.  Patient received 3 days of IV antibiotics and was continued on additional 4 days of ciprofloxacin.   She is tolerating antibiotics well.  Continues to have lower back pain as well as leg pain.  It is much improved since hospitalization.  No CVA tenderness, abdominal exam benign. ?-Continue antibiotics ?- Encouraged Tylenol use over continue NSAID use ?- Follow-up if symptoms of lower back pain and leg pain fail to resolve ? ?Elevated serum creatinine ?History of AKI with above hospitalization. ?- Basic Metabolic Panel ? ?HYPERTENSION, BENIGN SYSTEMIC ?Elevated today more appropriate upon recheck.  Likely elevated due to pain if home blood pressure readings are accurate.  Encouraged to follow-up in a week if blood pressure remains elevated. ? ?Lavonda Jumbo, DO ?[facility]  ?

## 2021-09-03 NOTE — Patient Instructions (Signed)
It was wonderful to see you today. ? ?Please bring ALL of your medications with you to every visit.  ? ?Today we talked about: ? ?Finishing your antibiotics. For pain you can use tylenol over the counter every 4 hours ? ?Follow up in 1 week if BP continues to be elevated.  ? ?Please be sure to schedule follow up at the front  desk before you leave today.  ? ?If you haven't already, sign up for My Chart to have easy access to your labs results, and communication with your primary care physician. ? ?Please call the clinic at [phone] if your symptoms worsen or you have any concerns. It was our pleasure to serve you. ? ?Dr. Janus Molder ? ?

## 2021-09-04 LAB — BASIC METABOLIC PANEL
BUN/Creatinine Ratio: 12 (ref 12–28)
BUN: 15 mg/dL (ref 8–27)
CO2: 23 mmol/L (ref 20–29)
Calcium: 10.2 mg/dL (ref 8.7–10.3)
Chloride: 103 mmol/L (ref 96–106)
Creatinine, Ser: 1.23 mg/dL — ABNORMAL HIGH (ref 0.57–1.00)
Glucose: 116 mg/dL — ABNORMAL HIGH (ref 70–99)
Potassium: 4.4 mmol/L (ref 3.5–5.2)
Sodium: 142 mmol/L (ref 134–144)
eGFR: 47 mL/min/{1.73_m2} — ABNORMAL LOW (ref >59)

## 2021-09-05 ENCOUNTER — Encounter: Payer: Self-pay | Admitting: Family Medicine

## 2021-09-10 ENCOUNTER — Ambulatory Visit (INDEPENDENT_AMBULATORY_CARE_PROVIDER_SITE_OTHER): Payer: Medicare HMO | Admitting: Family Medicine

## 2021-09-10 ENCOUNTER — Encounter: Payer: Self-pay | Admitting: Family Medicine

## 2021-09-10 VITALS — BP 118/70 | HR 73 | Ht 66.5 in | Wt 177.2 lb

## 2021-09-10 DIAGNOSIS — M629 Disorder of muscle, unspecified: Secondary | ICD-10-CM

## 2021-09-10 NOTE — Progress Notes (Signed)
? ? ?  SUBJECTIVE:  ? ?CHIEF COMPLAINT / HPI:  ? ?Bilateral leg pain ?Continued leg pain for 2 months. Worsening with recent hospitalization for pyelonephritis but has improved significantly since being discharge. Worse with climbing stairs and getting out of the car. She has been trying stretching exercises at home. She denies tingling, numbness. Denies hip and knee pain. Pain is located at the back of thighs bilaterally. Denies fever, weight loss, and night sweats. ? ?PERTINENT  PMH / PSH: DJD of the hips, OA ? ?OBJECTIVE:  ? ?BP 118/70   Pulse 73   Ht 5' 6.5" (1.689 m)   Wt 177 lb 3.2 oz (80.4 kg)   SpO2 98%   BMI 28.17 kg/m?   ?General: Appears well, no acute distress. Age appropriate. ?Cardiac: RRR, normal heart sounds, no murmurs ?Respiratory: CTAB, normal effort ?Abdomen: soft, nontender, nondistended ?Extremities:  Hip:  ?- Inspection: No gross deformity, no swelling, erythema, or ecchymosis b/l ?- Palpation: TTP along hamstring muscles bilaterally ?- ROM: Normal range of motion on Flexion, abduction, internal and external rotation b/l. Extension with forward bending is significantly decreased. ?- Strength: Normal strength in all fields b/l ?- Neuro/vasc: NV intact distally b/l ?- Special Tests: Negative FABER and FADIR b/l.   ? Knee: ?- Inspection: no gross deformity b/l. No swelling/effusion, erythema or bruising b/l. Skin intact ?- ROM: full active ROM with flexion and extension in knee and hip b/l ?- Strength: 5/5 strength b/l ?- Neuro/vasc: NV intact distally b/l ?Neuro: alert and oriented, no focal deficits ?Psych: normal affect ? ?ASSESSMENT/PLAN:  ? ?Hamstring tightness of both lower extremities ?2 months. Reviewed prior OV 1/23, patient given stretches for tight hamstrings. States she wants to continue to do this at home for a while longer before pursuing formal PT. Offered to reprint stretches but patient states she still has them on hand at home.  ? ?Lavonda Jumbo, DO ?[facility]  ?

## 2021-09-10 NOTE — Patient Instructions (Addendum)
It was wonderful to see you today. ? ?Please bring ALL of your medications with you to every visit.  ? ?Today we talked about: ? ?Leg pain-as discussed you will continue to do your hamstring stretches.  If you continue to have leg pain with this we can consider getting physical therapy on board. ? ?Please be sure to schedule follow up at the front  desk before you leave today.  ? ?If you haven't already, sign up for My Chart to have easy access to your labs results, and communication with your primary care physician. ? ?Please call the clinic at [phone] if your symptoms worsen or you have any concerns. It was our pleasure to serve you. ? ?Dr. Janus Molder ? ?

## 2021-10-04 ENCOUNTER — Other Ambulatory Visit: Payer: Self-pay | Admitting: Family Medicine

## 2021-10-04 DIAGNOSIS — E119 Type 2 diabetes mellitus without complications: Secondary | ICD-10-CM

## 2021-10-07 ENCOUNTER — Ambulatory Visit (INDEPENDENT_AMBULATORY_CARE_PROVIDER_SITE_OTHER): Payer: Medicare HMO | Admitting: Family Medicine

## 2021-10-07 ENCOUNTER — Encounter: Payer: Self-pay | Admitting: Family Medicine

## 2021-10-07 VITALS — BP 152/64 | HR 55 | Ht 66.0 in | Wt 181.0 lb

## 2021-10-07 DIAGNOSIS — I10 Essential (primary) hypertension: Secondary | ICD-10-CM | POA: Diagnosis not present

## 2021-10-07 DIAGNOSIS — E119 Type 2 diabetes mellitus without complications: Secondary | ICD-10-CM | POA: Diagnosis not present

## 2021-10-07 DIAGNOSIS — M629 Disorder of muscle, unspecified: Secondary | ICD-10-CM

## 2021-10-07 LAB — POCT GLYCOSYLATED HEMOGLOBIN (HGB A1C): HbA1c, POC (controlled diabetic range): 6.6 % (ref 0.0–7.0)

## 2021-10-07 NOTE — Progress Notes (Signed)
? ? ?  SUBJECTIVE:  ? ?CHIEF COMPLAINT / HPI:  ? ?Diabetes ?Current Regimen: diet controlled ?CBGs: 98 this morning, has not been lower  ?Last A1c: 6.8 on 07/07/21  ?Denies polyuria, polydipsia, hypoglycemia ?Statin: lipitor 40 mg ?ACE/ARB: lisinopril 10 mg daily ? ?Bilateral leg pain  Hamstring tightness of both lower extremities ?Today she continues to endorse the below symptoms that are not improving with home exercise program for strengthen hamstring muscles. She now desires formal PT.   ? ?09/10/21 ?Continued leg pain for 2 months. Worsening with recent hospitalization for pyelonephritis but has improved significantly since being discharge. Worse with climbing stairs and getting out of the car. She has been trying stretching exercises at home. She denies tingling, numbness. Denies hip and knee pain. Pain is located at the back of thighs bilaterally. Denies fever, weight loss, and night sweats. ? ?PERTINENT  PMH / PSH: As above.  ? ?OBJECTIVE:  ? ?BP (!) 152/64   Pulse (!) 55   Ht 5\' 6"  (1.676 m)   Wt 181 lb (82.1 kg)   SpO2 100%   BMI 29.21 kg/m?   ?Physical Exam ?Vitals reviewed.  ?Constitutional:   ?   General: She is not in acute distress. ?   Appearance: She is not ill-appearing, toxic-appearing or diaphoretic.  ?Pulmonary:  ?   Effort: Pulmonary effort is normal.  ?Musculoskeletal:  ?   Comments: - Inspection: No gross deformity, no swelling, erythema, or ecchymosis b/l ?- Palpation: TTP PSIS to ischial tuberosities along mid hamstring distally ?- ROM: Full range of motion on Flexion, extension, abduction, internal. Decreased ROM with external rotation b/l ?- Strength: 4/5 strength with resisted flexion at the knee ?- Neuro/vasc: NV intact distally b/l ?- Special Tests: Negative FABER and FADIR b/l  ?Neurological:  ?   Mental Status: She is oriented to person, place, and time.  ?Psychiatric:     ?   Mood and Affect: Mood normal.     ?   Behavior: Behavior normal.  ? ?ASSESSMENT/PLAN:  ? ?1. Type 2  diabetes mellitus without complication, without long-term current use of insulin (HCC) ?Diet controlled and at goal. Repeat A1c in 6 months.  ?- HgB A1c ? ?2. Hamstring tightness of both lower extremities ?Follow up. No improvement with home exercises. Consider sacroiliitis with tenderness at PSIS and ischial tuberosities with decreased ROM of external rotation at the hip.  ?- Ambulatory referral to Physical Therapy ? ?3. HYPERTENSION, BENIGN SYSTEMIC ? Will call patient to discussed RN visit for BP recheck in 1-2 weeks. At goal our last visit. Taking lisinopril 10 mg daily.  ? ? , DO ?[facility]  ?

## 2021-10-07 NOTE — Patient Instructions (Signed)
It was wonderful to see you today. ? ?Please bring ALL of your medications with you to every visit.  ? ?Today we talked about: ? ?Continuing balanced diet for diet controlled diabetes.  ? ?For your hamstrings and possible piriformis syndrome I have referred you to physical therapy.  ? ?Please be sure to schedule follow up at the front  desk before you leave today.  ? ?If you haven't already, sign up for My Chart to have easy access to your labs results, and communication with your primary care physician. ? ?Please call the clinic at [phone] if your symptoms worsen or you have any concerns. It was our pleasure to serve you. ? ?Dr. Salvadore Dom ? ?

## 2021-10-15 ENCOUNTER — Other Ambulatory Visit: Payer: Self-pay | Admitting: Family Medicine

## 2021-10-27 ENCOUNTER — Telehealth: Payer: Self-pay | Admitting: *Deleted

## 2021-10-27 NOTE — Telephone Encounter (Signed)
Patient called and states that she is experiencing bad leg pain.  She is scheduled for physical therapy on Saturday but is requesting a non-narcotic pain medicine that will help her get through the next few days.  She is no longer taking the gabapentin as this wasn't working for her.  ? ?If the patient needs: ? ? An appointment - route response to [facility] admin ? A callback from clinic staff - route response to your team ? ? ?Only route to RN Team: form completions or if RN team directly requests response sent to them ? ? ?Makynleigh Breslin, CMA ? ?

## 2021-10-30 ENCOUNTER — Encounter: Payer: Self-pay | Admitting: Family Medicine

## 2021-10-30 ENCOUNTER — Ambulatory Visit (INDEPENDENT_AMBULATORY_CARE_PROVIDER_SITE_OTHER): Payer: Medicare HMO | Admitting: Family Medicine

## 2021-10-30 VITALS — BP 138/61 | HR 65 | Ht 66.0 in | Wt 179.4 lb

## 2021-10-30 DIAGNOSIS — R7989 Other specified abnormal findings of blood chemistry: Secondary | ICD-10-CM | POA: Diagnosis not present

## 2021-10-30 DIAGNOSIS — M5432 Sciatica, left side: Secondary | ICD-10-CM | POA: Diagnosis not present

## 2021-10-30 DIAGNOSIS — M5431 Sciatica, right side: Secondary | ICD-10-CM | POA: Diagnosis not present

## 2021-10-30 MED ORDER — DICLOFENAC SODIUM 1 % EX GEL: 2.0000 g | Freq: Four times a day (QID) | CUTANEOUS | 0 refills | Status: DC

## 2021-10-30 NOTE — Patient Instructions (Signed)
-   For leg pain I have prescribed Voltaren gel - Follow-up with PT - I will notify you of lab when available

## 2021-10-30 NOTE — Progress Notes (Signed)
    SUBJECTIVE:   CHIEF COMPLAINT / HPI:   Leg pain follow-up Continues to have bilateral leg pain that starts in her buttock and shoots down her legs.  It is worse on the left side.  Has physical therapy this Saturday.  Has since stopped taking gabapentin due to cost and felt as if it was ineffective with nerve pain.  Denies fever, night sweats, weight loss, worsening back pain.  PERTINENT  PMH / PSH: Pyelonephritis, AKI  OBJECTIVE:   BP 138/61   Pulse 65   Ht 5\' 6"  (1.676 m)   Wt 179 lb 6.4 oz (81.4 kg)   SpO2 100%   BMI 28.96 kg/m   General: Appears well, no acute distress. Age appropriate. Respiratory: normal effort MSK:   Hip exam - Inspection: No gross deformity, no swelling, erythema, or ecchymosis b/l - Palpation: TTP PSIS to ischial tuberosities along mid hamstring distally - ROM: Full range of motion on Flexion, extension, abduction, internal. Decreased ROM with external rotation b/l - Strength: 4/5 strength with resisted flexion at the knee - Neuro/vasc: NV intact distally b/l - Special Tests: Negative FABER and FADIR b/l  Neuro: alert and oriented, no focal deficits Psych: normal affect  ASSESSMENT/PLAN:   Bilateral sciatica Chronic. Referred to PT and attempted gabapentin without relief.  Would not prescribe NSAIDs at this time needs repeat BMP due to reasons below. - diclofenac Sodium (VOLTAREN) 1 % GEL; Apply 2 g topically 4 (four) times daily.  Dispense: 100 g; Refill: 0  2. Elevated serum creatinine Recent history of pyelonephritis with AKI.  Creatinine continue to be elevated posthospitalization.  Will need to obtain lab below. - Basic Metabolic Panel  , DO  [facility]

## 2021-10-31 ENCOUNTER — Other Ambulatory Visit: Payer: Self-pay | Admitting: Family Medicine

## 2021-10-31 DIAGNOSIS — E119 Type 2 diabetes mellitus without complications: Secondary | ICD-10-CM

## 2021-10-31 LAB — BASIC METABOLIC PANEL
BUN/Creatinine Ratio: 17 (ref 12–28)
BUN: 20 mg/dL (ref 8–27)
CO2: 22 mmol/L (ref 20–29)
Calcium: 9.6 mg/dL (ref 8.7–10.3)
Chloride: 106 mmol/L (ref 96–106)
Creatinine, Ser: 1.19 mg/dL — ABNORMAL HIGH (ref 0.57–1.00)
Glucose: 109 mg/dL — ABNORMAL HIGH (ref 70–99)
Potassium: 4.1 mmol/L (ref 3.5–5.2)
Sodium: 146 mmol/L — ABNORMAL HIGH (ref 134–144)
eGFR: 49 mL/min/{1.73_m2} — ABNORMAL LOW (ref >59)

## 2021-11-01 ENCOUNTER — Encounter: Payer: Self-pay | Admitting: Rehabilitative and Restorative Service Providers"

## 2021-11-01 ENCOUNTER — Ambulatory Visit: Payer: Medicare HMO | Attending: Family Medicine | Admitting: Rehabilitative and Restorative Service Providers"

## 2021-11-01 DIAGNOSIS — M79605 Pain in left leg: Secondary | ICD-10-CM | POA: Insufficient documentation

## 2021-11-01 DIAGNOSIS — M79604 Pain in right leg: Secondary | ICD-10-CM | POA: Diagnosis not present

## 2021-11-01 DIAGNOSIS — R2689 Other abnormalities of gait and mobility: Secondary | ICD-10-CM | POA: Insufficient documentation

## 2021-11-01 DIAGNOSIS — M5459 Other low back pain: Secondary | ICD-10-CM | POA: Diagnosis not present

## 2021-11-01 DIAGNOSIS — M629 Disorder of muscle, unspecified: Secondary | ICD-10-CM | POA: Insufficient documentation

## 2021-11-01 NOTE — Therapy (Signed)
[facility] [address] [place], [place], [address] Phone: [phone]   Fax:  [phone]  Patient Details  Name: Candice Warner Marquard MRN: [id number] Date of Birth: [birth year] Referring Provider:  Nestor RampNeal, Sara L, MD  Encounter Date: 11/01/2021   OUTPATIENT PHYSICAL THERAPY LOWER EXTREMITY EVALUATION   Patient Name: Candice Warner Baumgart MRN: [id number] DOB:[birth year], 71 y.o., female Today's Date: 11/01/2021   PT End of Session - 11/01/21 1253     Visit Number 1    Date for PT Re-Evaluation 12/13/21    Authorization Type [employer] MCR    Progress Note Due on Visit 10    PT Start Time 0941    PT Stop Time 1032    PT Time Calculation (min) 51 min    Activity Tolerance Patient limited by pain    Behavior [facility] for tasks assessed/performed             Past Medical History:  Diagnosis Date   Arthritis    "right hip" (06/27/2018)   Depression    Hyperlipidemia    Hypertension    Hypokalemia    Hypothyroidism    Lumbar back pain    Obesity    Radiculopathy    Thyroid disease    Type II diabetes mellitus (HCC)    Past Surgical History:  Procedure Laterality Date   CYST REMOVAL HAND Right 11/07/2015   Procedure: EXCISION OF VOLAR RETANICULUM RIGHT HAND;  Surgeon: Loreta Aveaniel F Murphy, MD;  Location: [facility];  Service: Orthopedics;  Laterality: Right;   SHOULDER ARTHROSCOPY Left 2002   SHOULDER ARTHROSCOPY WITH ROTATOR CUFF REPAIR AND SUBACROMIAL DECOMPRESSION  07/14/2012   Procedure: SHOULDER ARTHROSCOPY WITH ROTATOR CUFF REPAIR AND SUBACROMIAL DECOMPRESSION;  Surgeon: Loreta Aveaniel F Murphy, MD;  Location: [facility];  Service: Orthopedics;  Laterality: Right;  Right Shoulder Arthroscopy, Distal Claviculectomy, Subacromial Decompression, Partial Acromioplasty with Coracoacromial Release with Arthroscopic Rotator Cuff Repair    THYROID LOBECTOMY Right    Goiter s/p right thyroidectomy   TRIGGER  FINGER RELEASE Right 11/07/2015   Procedure: RELEASE TRIGGER FINGER/A-1 PULLEY RIGHT RING FINGER ;  Surgeon: Loreta Aveaniel F Murphy, MD;  Location: [facility];  Service: Orthopedics;  Laterality: Right;   TUBAL LIGATION     Patient Active Problem List   Diagnosis Date Noted   Tenosynovitis of right wrist 03/04/2020   Unspecified lump in the right breast, unspecified quadrant 06/26/2019   Osteoarthritis 03/15/2019   Night sweats 06/30/2016   Nodule of chest wall 06/21/2015   Trigger finger, acquired 10/23/2014   Insomnia 08/27/2014   Right hip pain 06/01/2011   HLD (hyperlipidemia) 04/16/2009   Type 2 diabetes mellitus without complication, without long-term current use of insulin (HCC) 10/29/2008   DEGENERATIVE JOINT DISEASE, HIPS 10/29/2008   Hypothyroidism 08/12/2006   OBESITY, NOS 08/12/2006   Major depressive disorder, recurrent episode (HCC) 08/12/2006   HYPERTENSION, BENIGN SYSTEMIC 08/12/2006   DIVERTICULOSIS OF COLON 08/12/2006    PCP: Lavonda JumboSimone Autry-Lott, DO  REFERRING PROVIDER: Denny LevySara Neal, MD  REFERRING DIAG: Hamstring tightness bil LEs  THERAPY DIAG:  Other low back pain  Other abnormalities of gait and mobility  Pain in left leg  Pain in right leg  Rationale for Evaluation and Treatment Rehabilitation  ONSET DATE: 3 months ago  SUBJECTIVE:   SUBJECTIVE STATEMENT: Been going on for months and months with no idea how it started. Starts in butt and goes down the leg front and back and stops  at the knee bil. L leg feels like it is going to give out.   PERTINENT HISTORY: Per chart review 09/10/21: Continued leg pain for 2 months. Worsening with recent hospitalization for pyelonephritis but has improved significantly since being discharge. Worse with climbing stairs and getting out of the car. She has been trying stretching exercises at home. She denies tingling, numbness. Denies hip and knee pain. Pain is located at the back of thighs bilaterally  PAIN:   Are you having pain? Yes Throbbing pain Alleviates factor: change in position Aggravating factors: remaining in the position  PRECAUTIONS: None  WEIGHT BEARING RESTRICTIONS No  FALLS:  Has patient fallen in last 6 months? No  LIVING ENVIRONMENT: Lives with: lives with their family Lives in: House/apartment   OCCUPATION: caretaker (chores)  PLOF: Independent  PATIENT GOALS to be able to move around without pain   OBJECTIVE:   DIAGNOSTIC FINDINGS: none yet  PATIENT SURVEYS:  Needs to be done  COGNITION:  Overall cognitive status: Within functional limits for tasks assessed     SENSATION: WNL   POSTURE: rounded shoulders and flexed trunk   PALPATION: Lumbar paraspinals tight, R ASIS lower than L, R patella tubercle lower than left; r medial malleoli lower, bil Piriformis tight, PSIS =,L medial hamstring tighter, ischial tuberosities not TTP but are painful when she is sitting on them, L4 hypomobile and when assessed it referred pain to L glute, R sacrum deeper   LOWER EXTREMITY ROM:  Passive ROM Right eval Left eval  Hip flexion Limited to 90  degrees Limited to 90 degrees  Hip extension    Hip abduction limited limited  Hip adduction limited limited  Hip internal rotation limited limited  Hip external rotation Limited  limited  Knee flexion WNL WNL  Knee extension WNL WNL  Ankle dorsiflexion WNL WNL  Ankle plantarflexion    Ankle inversion    Ankle eversion     (Blank rows = not tested) Lumbar AROM standing: flexion limited 25%. Extension WNL All hip PROM limited all planes due to pain; pt very pain dominant bil LEs  LOWER EXTREMITY MMT:  MMT Right eval Left eval  Hip flexion 4+/5 4/5  Hip extension    Hip abduction 4/5 4/5  Hip adduction 4/5 4/5  Hip internal rotation    Hip external rotation    Knee flexion 4+/5 4/5  Knee extension 4+/5 4/5  Ankle dorsiflexion 4+/5 4/5  Ankle plantarflexion    Ankle inversion    Ankle eversion      (Blank rows = not tested)  LOWER EXTREMITY SPECIAL TESTS:  R fabers +, R crossover ITB painful, R SLR + for hamstring tightness; L fabers + for pain and limitation of motion, L SLR + for hamstring tightness, L crossover ITB painful. Thomas/Obers +bil  GAIT: Distance walked: in gym Assistive device utilized: None Level of assistance: Complete Independence Comments: minimal heel strike, walks with both knees slightly flexed, no trunk rotation, minimal arm swing on left, walks carefully     TODAY'S TREATMENT: See pt education section   PATIENT EDUCATION:  Education details: reviewed PT POC, discussed findings of evaluation, advised pt on wearing shorts/leggings next visit Person educated: Patient Education method: Explanation Education comprehension: verbalized understanding   HOME EXERCISE PROGRAM: Limited by time at eval; will be issued at next appt  ASSESSMENT:  CLINICAL IMPRESSION: Patient is a 71 y.o. female who was seen today for physical therapy evaluation and treatment for bil LE tightness which began at  approximately L4 and refers down her entire thigh to her knee. She is very pain dominant and has limitations of all bil hip movements. She would benefit from PT for manual therapy for L4 mobility and therex/manual therapy for bil hip flexibility. She also needs to be measured for leg length discrepancy.    OBJECTIVE IMPAIRMENTS Abnormal gait, decreased activity tolerance, decreased coordination, decreased mobility, difficulty walking, decreased ROM, decreased strength, hypomobility, increased muscle spasms, impaired flexibility, postural dysfunction, and pain.   ACTIVITY LIMITATIONS carrying, bending, sitting, standing, squatting, sleeping, stairs, transfers, bed mobility, hygiene/grooming, locomotion level, and caring for others  PARTICIPATION LIMITATIONS: meal prep, cleaning, laundry, driving, shopping, community activity, and occupation  PERSONAL FACTORS 1 comorbidity:  OA  are also affecting patient's functional outcome.   REHAB POTENTIAL: Good  CLINICAL DECISION MAKING: Evolving/moderate complexity  EVALUATION COMPLEXITY: Moderate   GOALS: Goals reviewed with patient? Yes  SHORT TERM GOALS: Target date: 11/22/2021   Pt will be independent with initial HEP Baseline:not issued at eval due to time constraints Goal status: INITIAL  2.  Pt will report ability to sit and watch TV with 25% less difficulty Baseline: unable > 10 min before change in position is needed Goal status: INITIAL   LONG TERM GOALS: Target date:  12/13/21    Pt will be able to go up the stairs without feeling like the left leg will give out >/=75% of the time Baseline: unable Goal status: INITIAL  2.  Pt will be able to walk with </=4/10 bil LE pain >/=15 min Baseline: 9/10 Goal status: INITIAL  3.  Pt will be able to stand with </= 4/10 bil LE pain without the need to sit down to wash dishes Baseline: 8/10 bil LE pain Goal status: INITIAL  4.  Pt will be able to sit >30 min with 75% less difficulty (esp at L ischial tuberosity) Baseline: 10 min; L glute more painful with sitting but both hurt Goal status: INITIAL  5.  Pt will increase bil LE strength >/=1/2 MMT grade to assist with improved functional mobility Baseline:  MMT Right eval Left eval  Hip flexion 4+/5 4/5  Hip extension    Hip abduction 4/5 4/5  Hip adduction 4/5 4/5  Hip internal rotation    Hip external rotation    Knee flexion 4+/5 4/5  Knee extension 4+/5 4/5  Ankle dorsiflexion 4+/5 4/5  Ankle plantarflexion    Ankle inversion    Ankle eversion     Goal status: INITIAL  6.  Pt will report overall bil LE pain </=4/10 with work duties (mopping, sweeping, cleaning) Baseline: up to 8/10 Goal status: INITIAL   PLAN: PT FREQUENCY: 2x/week  PT DURATION: 6 weeks  PLANNED INTERVENTIONS: Therapeutic exercises, Therapeutic activity, Neuromuscular re-education, Gait training, Patient/Family  education, Joint mobilization, Stair training, Dry Needling, Cryotherapy, Moist heat, Taping, Ultrasound, Ionotophoresis 4mg /ml Dexamethasone, Manual therapy, and Re-evaluation. [place] MAY NOT BE COVERED BY [employer]-IF NOT, ONLY USE IF DISCUSS WITH PATIENT  PLAN FOR NEXT SESSION: do FOTO, measure leg length and issue heel lift if avail, look at HEP given by MD, issue new HEP if needed, bil hip flexibility, L4 mobilization, Piriformis stretching   , PT 11/01/2021, 1:12 PM    11/03/2021, PT 11/01/2021, 1:12 PM  [facility] [address] [place], [place], [place] Phone: [phone]   Fax:  [phone]

## 2021-11-02 ENCOUNTER — Encounter: Payer: Self-pay | Admitting: Family Medicine

## 2021-11-03 ENCOUNTER — Ambulatory Visit: Payer: Medicare HMO

## 2021-11-04 ENCOUNTER — Encounter: Payer: Self-pay | Admitting: Family Medicine

## 2021-11-15 ENCOUNTER — Encounter: Payer: Self-pay | Admitting: Rehabilitative and Restorative Service Providers"

## 2021-11-15 ENCOUNTER — Ambulatory Visit: Payer: Medicare HMO | Attending: Family Medicine | Admitting: Rehabilitative and Restorative Service Providers"

## 2021-11-15 DIAGNOSIS — M79605 Pain in left leg: Secondary | ICD-10-CM | POA: Diagnosis not present

## 2021-11-15 DIAGNOSIS — M5459 Other low back pain: Secondary | ICD-10-CM | POA: Insufficient documentation

## 2021-11-15 DIAGNOSIS — M79604 Pain in right leg: Secondary | ICD-10-CM | POA: Diagnosis not present

## 2021-11-15 DIAGNOSIS — R2689 Other abnormalities of gait and mobility: Secondary | ICD-10-CM | POA: Insufficient documentation

## 2021-11-15 NOTE — Therapy (Signed)
[facility] [address] [place], [place], [address] Phone: [phone]   Fax:  [phone]  Patient Details  Name: Candice Warner MRN: [id number] Date of Birth: 1951/04/08 Referring Provider:  Lavonda Jumbo, DO  Encounter Date: 11/15/2021   OUTPATIENT PHYSICAL THERAPY TREATMENT NOTE   Patient Name: Candice Warner MRN: [id number] DOB:February 28, 1951, 71 y.o., female Today's Date: 11/15/2021  PCP: Lavonda Jumbo, DO REFERRING PROVIDER: Denny Levy, MD  PT End of Session - 11/15/21 1006     Visit Number 2    Number of Visits 10    Date for PT Re-Evaluation 12/13/21    Authorization Type [employer] MCR    PT Start Time 0950    PT Stop Time 1041    PT Time Calculation (min) 51 min    Activity Tolerance Patient tolerated treatment well;Patient limited by pain    Behavior During Therapy [facility] for tasks assessed/performed             Past Medical History:  Diagnosis Date   Arthritis    "right hip" (06/27/2018)   Depression    Hyperlipidemia    Hypertension    Hypokalemia    Hypothyroidism    Lumbar back pain    Obesity    Radiculopathy    Thyroid disease    Type II diabetes mellitus (HCC)    Past Surgical History:  Procedure Laterality Date   CYST REMOVAL HAND Right 11/07/2015   Procedure: EXCISION OF VOLAR RETANICULUM RIGHT HAND;  Surgeon: Loreta Ave, MD;  Location: [facility];  Service: Orthopedics;  Laterality: Right;   SHOULDER ARTHROSCOPY Left 2002   SHOULDER ARTHROSCOPY WITH ROTATOR CUFF REPAIR AND SUBACROMIAL DECOMPRESSION  07/14/2012   Procedure: SHOULDER ARTHROSCOPY WITH ROTATOR CUFF REPAIR AND SUBACROMIAL DECOMPRESSION;  Surgeon: Loreta Ave, MD;  Location: [facility];  Service: Orthopedics;  Laterality: Right;  Right Shoulder Arthroscopy, Distal Claviculectomy, Subacromial Decompression, Partial Acromioplasty with Coracoacromial Release with Arthroscopic Rotator Cuff Repair     THYROID LOBECTOMY Right    Goiter s/p right thyroidectomy   TRIGGER FINGER RELEASE Right 11/07/2015   Procedure: RELEASE TRIGGER FINGER/A-1 PULLEY RIGHT RING FINGER ;  Surgeon: Loreta Ave, MD;  Location:  SURGERY CENTER;  Service: Orthopedics;  Laterality: Right;   TUBAL LIGATION     Patient Active Problem List   Diagnosis Date Noted   Calcification of left breast 06/26/2019   Osteoarthritis 03/15/2019   Nodule of chest wall 06/21/2015   Trigger finger, acquired 10/23/2014   Right hip pain 06/01/2011   HLD (hyperlipidemia) 04/16/2009   Type 2 diabetes mellitus without complication, without long-term current use of insulin (HCC) 10/29/2008   DEGENERATIVE JOINT DISEASE, HIPS 10/29/2008   Hypothyroidism 08/12/2006   OBESITY, NOS 08/12/2006   Major depressive disorder, recurrent episode (HCC) 08/12/2006   HYPERTENSION, BENIGN SYSTEMIC 08/12/2006   DIVERTICULOSIS OF COLON 08/12/2006    REFERRING DIAG: Hamstring tightness bil LEs  THERAPY DIAG:  Pain in left leg  Other abnormalities of gait and mobility  Rationale for Evaluation and Treatment Rehabilitation  PERTINENT HISTORY: Per chart review 09/10/21: Continued leg pain for 2 months. Worsening with recent hospitalization for pyelonephritis but has improved significantly since being discharge. Worse with climbing stairs and getting out of the car. She has been trying stretching exercises at home. She denies tingling, numbness. Denies hip and knee pain. Pain is located at the back of thighs bilaterally  PRECAUTIONS: none  SUBJECTIVE: My leg gave out  on me twice this week after giong up some steps and had to catch myself.   PAIN:  Are you having pain? Yes: NPRS scale: 6/10 Pain location: L glute down L leg to above knee Pain description: giving out Aggravating factors: doing stairs Relieving factors: rest   OBJECTIVE:    DIAGNOSTIC FINDINGS: none yet   PATIENT SURVEYS:  Needs to be done   COGNITION:            Overall cognitive status: Within functional limits for tasks assessed                          SENSATION: WNL     POSTURE: rounded shoulders and flexed trunk    PALPATION: Lumbar paraspinals tight, R ASIS lower than L, R patella tubercle lower than left; r medial malleoli lower, bil Piriformis tight, PSIS =,L medial hamstring tighter, ischial tuberosities not TTP but are painful when she is sitting on them, L4 hypomobile and when assessed it referred pain to L glute, R sacrum deeper    LOWER EXTREMITY ROM:   Passive ROM Right eval Left eval  Hip flexion Limited to 90  degrees Limited to 90 degrees  Hip extension      Hip abduction limited limited  Hip adduction limited limited  Hip internal rotation limited limited  Hip external rotation Limited  limited  Knee flexion WNL WNL  Knee extension WNL WNL  Ankle dorsiflexion WNL WNL  Ankle plantarflexion      Ankle inversion      Ankle eversion       (Blank rows = not tested) Lumbar AROM standing: flexion limited 25%. Extension WNL All hip PROM limited all planes due to pain; pt very pain dominant bil LEs   LOWER EXTREMITY MMT:   MMT Right eval Left eval  Hip flexion 4+/5 4/5  Hip extension      Hip abduction 4/5 4/5  Hip adduction 4/5 4/5  Hip internal rotation      Hip external rotation      Knee flexion 4+/5 4/5  Knee extension 4+/5 4/5  Ankle dorsiflexion 4+/5 4/5  Ankle plantarflexion      Ankle inversion      Ankle eversion       (Blank rows = not tested)   LOWER EXTREMITY SPECIAL TESTS:  R fabers +, R crossover ITB painful, R SLR + for hamstring tightness; L fabers + for pain and limitation of motion, L SLR + for hamstring tightness, L crossover ITB painful. Thomas/Obers +bil   GAIT: Distance walked: in gym Assistive device utilized: None Level of assistance: Complete Independence Comments: minimal heel strike, walks with both knees slightly flexed, no trunk rotation, minimal arm swing on left, walks  carefully        TODAY'S TREATMENT: [facility] Adult PT Treatment:                                                DATE: 11/15/21 Therapeutic Exercise: NuStep bil UE/LE level 1 x 6 min (attempted level 2 but increased pt's pain) L Quad set x15 L SLR x 10 L KTC 3x30 sec Leg lengthener alternating supine x 10 each side Supine figure 4 3x30 sec Supine L leg figure 4 modified with leg on bed with towel to pull upwards 3x30 sec Trunk rotation  x 5 each side  Therapeutic Activity: Measured leg length with R 35 in and L 34.5 in. 1 ply heel lift issued for L shoe. Did a trial of walking with heel lift in on L with pt walking smoother and L LE not giving out.Did a trial of removing heel lift with patient having knee immediately giving out and less efficient gait. Patient prefers heel lift. PT advised her leg may be more sore due to walking differently.    11/01/21: At Eval: See pt education section     PATIENT EDUCATION:  Education details: HEP issued Person educated: Patient Education method: Explanation Education comprehension: verbalized understanding     HOME EXERCISE PROGRAM:  Access Code: [email or web address] URL: [email or web address] Date: 11/15/2021 Prepared by: Luna Fuse  Exercises - Supine Quad Set  - 2 x daily - 7 x weekly - 1 sets - 10 reps - Supine Single Knee to Chest Stretch  - 2 x daily - 7 x weekly - 1 sets - 2 reps - 30 sec hold - Supine Figure 4 Piriformis Stretch  - 1 x daily - 7 x weekly - 1 sets - 2 reps - Supine Piriformis Stretch with Leg Straight  - 1 x daily - 7 x weekly - 1 sets - 10 reps - Supine Lower Trunk Rotation  - 2 x daily - 7 x weekly - 1 sets - 10 reps - Hooklying Active Hamstring Stretch  - 1 x daily - 7 x weekly - 1 sets - 2 reps - 30 sec hold    ASSESSMENT:   CLINICAL IMPRESSION: Patient is a 71 y.o. female who was seen today for physical therapy treatment for bil LE tightness which began at approximately L4 and refers down her entire thigh to  her L knee. L LE pain is worse than R. She is very pain dominant and has limitations of all bil hip movements. Heel lift trialed and issued and HEP with pt reports feeling much better after tx. She would benefit from PT for manual therapy for L4 mobility and therex/manual therapy for bil hip flexibility.      OBJECTIVE IMPAIRMENTS Abnormal gait, decreased activity tolerance, decreased coordination, decreased mobility, difficulty walking, decreased ROM, decreased strength, hypomobility, increased muscle spasms, impaired flexibility, postural dysfunction, and pain.    ACTIVITY LIMITATIONS carrying, bending, sitting, standing, squatting, sleeping, stairs, transfers, bed mobility, hygiene/grooming, locomotion level, and caring for others   PARTICIPATION LIMITATIONS: meal prep, cleaning, laundry, driving, shopping, community activity, and occupation   PERSONAL FACTORS 1 comorbidity: OA  are also affecting patient's functional outcome.    REHAB POTENTIAL: Good   CLINICAL DECISION MAKING: Evolving/moderate complexity   EVALUATION COMPLEXITY: Moderate     GOALS: Goals reviewed with patient? Yes   SHORT TERM GOALS: Target date: 11/22/2021    Pt will be independent with initial HEP Baseline:not issued at eval due to time constraints Goal status: INITIAL   2.  Pt will report ability to sit and watch TV with 25% less difficulty Baseline: unable > 10 min before change in position is needed Goal status: INITIAL     LONG TERM GOALS: Target date:  12/13/21     Pt will be able to go up the stairs without feeling like the left leg will give out >/=75% of the time Baseline: unable Goal status: INITIAL   2.  Pt will be able to walk with </=4/10 bil LE pain >/=15 min Baseline: 9/10 Goal status: INITIAL   3.  Pt will be  able to stand with </= 4/10 bil LE pain without the need to sit down to wash dishes Baseline: 8/10 bil LE pain Goal status: INITIAL   4.  Pt will be able to sit >30 min with 75%  less difficulty (esp at L ischial tuberosity) Baseline: 10 min; L glute more painful with sitting but both hurt Goal status: INITIAL   5.  Pt will increase bil LE strength >/=1/2 MMT grade to assist with improved functional mobility Baseline:  MMT Right eval Left eval  Hip flexion 4+/5 4/5  Hip extension      Hip abduction 4/5 4/5  Hip adduction 4/5 4/5  Hip internal rotation      Hip external rotation      Knee flexion 4+/5 4/5  Knee extension 4+/5 4/5  Ankle dorsiflexion 4+/5 4/5  Ankle plantarflexion      Ankle inversion      Ankle eversion        Goal status: INITIAL   6.  Pt will report overall bil LE pain </=4/10 with work duties (mopping, sweeping, cleaning) Baseline: up to 8/10 Goal status: INITIAL     PLAN: PT FREQUENCY: 2x/week   PT DURATION: 6 weeks   PLANNED INTERVENTIONS: Therapeutic exercises, Therapeutic activity, Neuromuscular re-education, Gait training, Patient/Family education, Joint mobilization, Stair training, Dry Needling, Cryotherapy, Moist heat, Taping, Ultrasound, Ionotophoresis 4mg /ml Dexamethasone, Manual therapy, and Re-evaluation. IONTO MAY NOT BE COVERED BY [employer]ET[employer]-IF NOT, ONLY USE IF DISCUSS WITH PATIENT   PLAN FOR NEXT SESSION: review HEP, see how heel lift did in L shoe since last treatment; continue to work on bil LE flexibility with emphasis on L, look at HEP given by MD, L4 mobilization, Piriformis stretching             Luna FuseNikia Moore, PT 11/15/2021, 1:32 PM  [facility] [address] [place], [place], [address] Phone: [phone]   Fax:  [phone]

## 2021-11-19 ENCOUNTER — Encounter: Payer: Self-pay | Admitting: Physical Therapy

## 2021-11-19 ENCOUNTER — Ambulatory Visit: Payer: Medicare HMO | Admitting: Physical Therapy

## 2021-11-19 DIAGNOSIS — R2689 Other abnormalities of gait and mobility: Secondary | ICD-10-CM

## 2021-11-19 DIAGNOSIS — M5459 Other low back pain: Secondary | ICD-10-CM

## 2021-11-19 DIAGNOSIS — M79605 Pain in left leg: Secondary | ICD-10-CM

## 2021-11-19 DIAGNOSIS — M79604 Pain in right leg: Secondary | ICD-10-CM

## 2021-11-19 NOTE — Therapy (Signed)
[facility] [address] [place], [place], [address] Phone: [phone]   Fax:  [phone]  Patient Details  Name: Candice Warner MRN: [id number] Date of Birth: 05-08-1951 Referring Provider:  Lavonda Jumbo, DO  Encounter Date: 11/19/2021   OUTPATIENT PHYSICAL THERAPY TREATMENT NOTE   Patient Name: Candice Warner MRN: [id number] DOB:[birth year], 71 y.o., female Today's Date: 11/19/2021  PCP: Lavonda Jumbo, DO REFERRING PROVIDER: Denny Levy, MD  PT End of Session - 11/19/21 0936     Visit Number 3    Number of Visits 10    Date for PT Re-Evaluation 12/13/21    Authorization Type [employer] MCR    Progress Note Due on Visit 10    PT Start Time 0933    PT Stop Time 1015    PT Time Calculation (min) 42 min    Activity Tolerance Patient tolerated treatment well;Patient limited by pain    Behavior During Therapy [facility] for tasks assessed/performed              Past Medical History:  Diagnosis Date   Arthritis    "right hip" (06/27/2018)   Depression    Hyperlipidemia    Hypertension    Hypokalemia    Hypothyroidism    Lumbar back pain    Obesity    Radiculopathy    Thyroid disease    Type II diabetes mellitus (HCC)    Past Surgical History:  Procedure Laterality Date   CYST REMOVAL HAND Right 11/07/2015   Procedure: EXCISION OF VOLAR RETANICULUM RIGHT HAND;  Surgeon: Loreta Ave, MD;  Location: [facility];  Service: Orthopedics;  Laterality: Right;   SHOULDER ARTHROSCOPY Left 2002   SHOULDER ARTHROSCOPY WITH ROTATOR CUFF REPAIR AND SUBACROMIAL DECOMPRESSION  07/14/2012   Procedure: SHOULDER ARTHROSCOPY WITH ROTATOR CUFF REPAIR AND SUBACROMIAL DECOMPRESSION;  Surgeon: Loreta Ave, MD;  Location: [facility];  Service: Orthopedics;  Laterality: Right;  Right Shoulder Arthroscopy, Distal Claviculectomy, Subacromial Decompression, Partial Acromioplasty with Coracoacromial Release  with Arthroscopic Rotator Cuff Repair    THYROID LOBECTOMY Right    Goiter s/p right thyroidectomy   TRIGGER FINGER RELEASE Right 11/07/2015   Procedure: RELEASE TRIGGER FINGER/A-1 PULLEY RIGHT RING FINGER ;  Surgeon: Loreta Ave, MD;  Location: [facility];  Service: Orthopedics;  Laterality: Right;   TUBAL LIGATION     Patient Active Problem List   Diagnosis Date Noted   Calcification of left breast 06/26/2019   Osteoarthritis 03/15/2019   Nodule of chest wall 06/21/2015   Trigger finger, acquired 10/23/2014   Right hip pain 06/01/2011   HLD (hyperlipidemia) 04/16/2009   Type 2 diabetes mellitus without complication, without long-term current use of insulin (HCC) 10/29/2008   DEGENERATIVE JOINT DISEASE, HIPS 10/29/2008   Hypothyroidism 08/12/2006   OBESITY, NOS 08/12/2006   Major depressive disorder, recurrent episode (HCC) 08/12/2006   HYPERTENSION, BENIGN SYSTEMIC 08/12/2006   DIVERTICULOSIS OF COLON 08/12/2006    REFERRING DIAG: Hamstring tightness bil LEs  THERAPY DIAG:  Pain in left leg  Other abnormalities of gait and mobility  Other low back pain  Pain in right leg  Rationale for Evaluation and Treatment Rehabilitation  PERTINENT HISTORY: Per chart review 09/10/21: Continued leg pain for 2 months. Worsening with recent hospitalization for pyelonephritis but has improved significantly since being discharge. Worse with climbing stairs and getting out of the car. She has been trying stretching exercises at home. She denies tingling, numbness. Denies hip and  knee pain. Pain is located at the back of thighs bilaterally  PRECAUTIONS: none  SUBJECTIVE:  I am sore today.  Uses heating pad and Tylenol.  Pain is mostly L side buttocks to knee, almost buckled on the way in here  No change with these exercises. The heel lift in the L LE did feel better but I don't wear my shoes at home.    PAIN:  Are you having pain? Yes: NPRS scale: 8/10 Pain location:  L glute down L leg to above knee Pain description: giving out Aggravating factors: doing stairs Relieving factors: rest   OBJECTIVE:    DIAGNOSTIC FINDINGS: none yet   PATIENT SURVEYS:  Needs to be done   COGNITION:           Overall cognitive status: Within functional limits for tasks assessed                          SENSATION: WNL     POSTURE: rounded shoulders and flexed trunk    PALPATION: Lumbar paraspinals tight, R ASIS lower than L, R patella tubercle lower than left; r medial malleoli lower, bil Piriformis tight, PSIS =,L medial hamstring tighter, ischial tuberosities not TTP but are painful when she is sitting on them, L4 hypomobile and when assessed it referred pain to L glute, R sacrum deeper    LOWER EXTREMITY ROM:   Passive ROM Right eval Left eval  Hip flexion Limited to 90  degrees Limited to 90 degrees  Hip extension      Hip abduction limited limited  Hip adduction limited limited  Hip internal rotation limited limited  Hip external rotation Limited  limited  Knee flexion WNL WNL  Knee extension WNL WNL  Ankle dorsiflexion WNL WNL  Ankle plantarflexion      Ankle inversion      Ankle eversion       (Blank rows = not tested) Lumbar AROM standing: flexion limited 25%. Extension WNL All hip PROM limited all planes due to pain; pt very pain dominant bil LEs   LOWER EXTREMITY MMT:   MMT Right eval Left eval  Hip flexion 4+/5 4/5  Hip extension      Hip abduction 4/5 4/5  Hip adduction 4/5 4/5  Hip internal rotation      Hip external rotation      Knee flexion 4+/5 4/5  Knee extension 4+/5 4/5  Ankle dorsiflexion 4+/5 4/5  Ankle plantarflexion      Ankle inversion      Ankle eversion       (Blank rows = not tested)   LOWER EXTREMITY SPECIAL TESTS:  R fabers +, R crossover ITB painful, R SLR + for hamstring tightness; L fabers + for pain and limitation of motion, L SLR + for hamstring tightness, L crossover ITB painful. Thomas/Obers +bil    GAIT: Distance walked: in gym Assistive device utilized: None Level of assistance: Complete Independence Comments: minimal heel strike, walks with both knees slightly flexed, no trunk rotation, minimal arm swing on left, walks carefully        TODAY'S TREATMENT:  [facility] Adult PT Treatment:                                                DATE: 11/19/21 Therapeutic Exercise: Standing hip hinge single leg  with chair support x 10 each LE , cues needed  Kneeling forearm plank x 3 x 15 sec Prone hip ext. X 10 each  Childs pose (rocking) x 5 Supine hamstring stretch 30 sec x 3  Bridging x 12 Lower trunk rotation x 10   Self care/ Manual Therapy: Prone compression to bilateral glutes, piriformis and L sacrum, sacral border L side more tender than Rt.  Quad stretch bilateral  Tennis ball L piriformis for trigger point release  [facility] Adult PT Treatment:                                                DATE: 11/15/21 Therapeutic Exercise: NuStep bil UE/LE level 1 x 6 min (attempted level 2 but increased pt's pain) L Quad set x15 L SLR x 10 L KTC 3x30 sec Leg lengthener alternating supine x 10 each side Supine figure 4 3x30 sec Supine L leg figure 4 modified with leg on bed with towel to pull upwards 3x30 sec Trunk rotation x 5 each side  Therapeutic Activity: Measured leg length with R 35 in and L 34.5 in. 1 ply heel lift issued for L shoe. Did a trial of walking with heel lift in on L with pt walking smoother and L LE not giving out.Did a trial of removing heel lift with patient having knee immediately giving out and less efficient gait. Patient prefers heel lift. PT advised her leg may be more sore due to walking differently.    11/01/21: At Eval: See pt education section     PATIENT EDUCATION:  Education details: HEP issued Person educated: Patient Education method: Explanation Education comprehension: verbalized understanding     HOME EXERCISE PROGRAM:  Access Code: [facility] URL:  [email or web address] Date: 11/19/2021 Prepared by: Karie Mainland  Exercises - Forward T with Counter Support  - 1 x daily - 7 x weekly - 2 sets - 10 reps - 5 hold - Kneeling Plank on Forearms with Scapular Protraction Retraction AROM  - 1 x daily - 7 x weekly - 1 sets - 5 reps - 10-30 sec hold - Prone Hip Extension with Pillow Under Abdomen  - 2 x daily - 7 x weekly - 2 sets - 10 reps - 5 hold - Quadruped Rocking Slow  - 2 x daily - 7 x weekly - 1 sets - 10 reps - 15 hold - Supine Bridge  - 2 x daily - 7 x weekly - 2 sets - 10 reps - 5 hold - Supine Hamstring Stretch with Strap  - 2 x daily - 7 x weekly - 1 sets - 5 reps - 30 hold - Supine Lower Trunk Rotation  - 2 x daily - 7 x weekly - 2 sets - 10 reps - 10 hold - Supine Piriformis Stretch with Foot on Ground  - 2 x daily - 7 x weekly - 1 sets - 5 reps - 30 hold - Supine Single Knee to Chest Stretch  - 2 x daily - 7 x weekly - 1 sets - 5 reps - 30 hold   ASSESSMENT:   CLINICAL IMPRESSION: Patient brought in her HEP, spent time educating and modifying those.  She was able to do a modified plank with good form.  Source of the pain is still unknown, working on teasing out lumbar from more peripheral (SIJ, hips).  Focus of pain seems Lt sacral region with significant trigger points. Recommended ice today when she got home and continued hip mobility, strength.      OBJECTIVE IMPAIRMENTS Abnormal gait, decreased activity tolerance, decreased coordination, decreased mobility, difficulty walking, decreased ROM, decreased strength, hypomobility, increased muscle spasms, impaired flexibility, postural dysfunction, and pain.    ACTIVITY LIMITATIONS carrying, bending, sitting, standing, squatting, sleeping, stairs, transfers, bed mobility, hygiene/grooming, locomotion level, and caring for others   PARTICIPATION LIMITATIONS: meal prep, cleaning, laundry, driving, shopping, community activity, and occupation   PERSONAL FACTORS 1  comorbidity: OA  are also affecting patient's functional outcome.    REHAB POTENTIAL: Good   CLINICAL DECISION MAKING: Evolving/moderate complexity   EVALUATION COMPLEXITY: Moderate     GOALS: Goals reviewed with patient? Yes   SHORT TERM GOALS: Target date: 11/22/2021    Pt will be independent with initial HEP Baseline:not issued at eval due to time constraints Goal status: INITIAL   2.  Pt will report ability to sit and watch TV with 25% less difficulty Baseline: unable > 10 min before change in position is needed Goal status: INITIAL     LONG TERM GOALS: Target date:  12/13/21     Pt will be able to go up the stairs without feeling like the left leg will give out >/=75% of the time Baseline: unable Goal status: INITIAL   2.  Pt will be able to walk with </=4/10 bil LE pain >/=15 min Baseline: 9/10 Goal status: INITIAL   3.  Pt will be able to stand with </= 4/10 bil LE pain without the need to sit down to wash dishes Baseline: 8/10 bil LE pain Goal status: INITIAL   4.  Pt will be able to sit >30 min with 75% less difficulty (esp at L ischial tuberosity) Baseline: 10 min; L glute more painful with sitting but both hurt Goal status: INITIAL   5.  Pt will increase bil LE strength >/=1/2 MMT grade to assist with improved functional mobility Baseline:     6.  Pt will report overall bil LE pain </=4/10 with work duties (mopping, sweeping, cleaning) Baseline: up to 8/10 Goal status: INITIAL  MMT Right eval Left eval  Hip flexion 4+/5 4/5  Hip extension      Hip abduction 4/5 4/5  Hip adduction 4/5 4/5  Hip internal rotation      Hip external rotation      Knee flexion 4+/5 4/5  Knee extension 4+/5 4/5  Ankle dorsiflexion 4+/5 4/5  Ankle plantarflexion      Ankle inversion      Ankle eversion        Goal status: INITIAL     PLAN: PT FREQUENCY: 2x/week   PT DURATION: 6 weeks   PLANNED INTERVENTIONS: Therapeutic exercises, Therapeutic activity,  Neuromuscular re-education, Gait training, Patient/Family education, Joint mobilization, Stair training, Dry Needling, Cryotherapy, Moist heat, Taping, Ultrasound, Ionotophoresis 4mg /ml Dexamethasone, Manual therapy, and Re-evaluation. IONTO MAY NOT BE COVERED BY [employer]-IF NOT, ONLY USE IF DISCUSS WITH PATIENT   PLAN FOR NEXT SESSION: review HEP, see how heel lift did in L shoe since last treatment; continue to work on bil LE flexibility with emphasis on L, look at HEP given by MD, L4 mobilization, Piriformis stretching             Kalyan Barabas, PT 11/19/2021, 11:50 AM  [facility] [address] [place], [place], [address] Phone: [phone]   Fax:  [phone]

## 2021-11-22 ENCOUNTER — Ambulatory Visit: Payer: Medicare HMO | Admitting: Rehabilitative and Restorative Service Providers"

## 2021-11-22 ENCOUNTER — Encounter: Payer: Self-pay | Admitting: Rehabilitative and Restorative Service Providers"

## 2021-11-22 DIAGNOSIS — M79604 Pain in right leg: Secondary | ICD-10-CM | POA: Diagnosis not present

## 2021-11-22 DIAGNOSIS — R2689 Other abnormalities of gait and mobility: Secondary | ICD-10-CM

## 2021-11-22 DIAGNOSIS — M79605 Pain in left leg: Secondary | ICD-10-CM

## 2021-11-22 DIAGNOSIS — M5459 Other low back pain: Secondary | ICD-10-CM

## 2021-11-22 NOTE — Therapy (Signed)
[facility] [address] [place], [place], [address] Phone: [phone]   Fax:  [phone]  Patient Details  Name: Candice Warner MRN: [id number] Date of Birth: 03/09/1951 Referring Provider:  Lavonda Jumbo, DO  Encounter Date: 11/22/2021   OUTPATIENT PHYSICAL THERAPY TREATMENT NOTE   Patient Name: Candice Warner MRN: [id number] DOB:01-27-1951, 71 y.o., female Today's Date: 11/22/2021  PCP: Lavonda Jumbo, DO REFERRING PROVIDER: Denny Levy, MD  PT End of Session - 11/22/21 1124     Visit Number 4    Number of Visits 10    Date for PT Re-Evaluation 12/13/21    PT Start Time 1123    PT Stop Time 1208    PT Time Calculation (min) 45 min    Activity Tolerance Patient tolerated treatment well;No increased pain    Behavior During Therapy [facility] for tasks assessed/performed               Past Medical History:  Diagnosis Date   Arthritis    "right hip" (06/27/2018)   Depression    Hyperlipidemia    Hypertension    Hypokalemia    Hypothyroidism    Lumbar back pain    Obesity    Radiculopathy    Thyroid disease    Type II diabetes mellitus (HCC)    Past Surgical History:  Procedure Laterality Date   CYST REMOVAL HAND Right 11/07/2015   Procedure: EXCISION OF VOLAR RETANICULUM RIGHT HAND;  Surgeon: Loreta Ave, MD;  Location: [facility];  Service: Orthopedics;  Laterality: Right;   SHOULDER ARTHROSCOPY Left 2002   SHOULDER ARTHROSCOPY WITH ROTATOR CUFF REPAIR AND SUBACROMIAL DECOMPRESSION  07/14/2012   Procedure: SHOULDER ARTHROSCOPY WITH ROTATOR CUFF REPAIR AND SUBACROMIAL DECOMPRESSION;  Surgeon: Loreta Ave, MD;  Location: [facility];  Service: Orthopedics;  Laterality: Right;  Right Shoulder Arthroscopy, Distal Claviculectomy, Subacromial Decompression, Partial Acromioplasty with Coracoacromial Release with Arthroscopic Rotator Cuff Repair    THYROID LOBECTOMY Right     Goiter s/p right thyroidectomy   TRIGGER FINGER RELEASE Right 11/07/2015   Procedure: RELEASE TRIGGER FINGER/A-1 PULLEY RIGHT RING FINGER ;  Surgeon: Loreta Ave, MD;  Location: [facility];  Service: Orthopedics;  Laterality: Right;   TUBAL LIGATION     Patient Active Problem List   Diagnosis Date Noted   Calcification of left breast 06/26/2019   Osteoarthritis 03/15/2019   Nodule of chest wall 06/21/2015   Trigger finger, acquired 10/23/2014   Right hip pain 06/01/2011   HLD (hyperlipidemia) 04/16/2009   Type 2 diabetes mellitus without complication, without long-term current use of insulin (HCC) 10/29/2008   DEGENERATIVE JOINT DISEASE, HIPS 10/29/2008   Hypothyroidism 08/12/2006   OBESITY, NOS 08/12/2006   Major depressive disorder, recurrent episode (HCC) 08/12/2006   HYPERTENSION, BENIGN SYSTEMIC 08/12/2006   DIVERTICULOSIS OF COLON 08/12/2006    REFERRING DIAG: Hamstring tightness bil LEs  THERAPY DIAG:  Pain in left leg  Other abnormalities of gait and mobility  Other low back pain  Pain in right leg  Rationale for Evaluation and Treatment Rehabilitation  PERTINENT HISTORY: Per chart review 09/10/21: Continued leg pain for 2 months. Worsening with recent hospitalization for pyelonephritis but has improved significantly since being discharge. Worse with climbing stairs and getting out of the car. She has been trying stretching exercises at home. She denies tingling, numbness. Denies hip and knee pain. Pain is located at the back of thighs bilaterally  PRECAUTIONS: none  SUBJECTIVE:  Heel lift is helping with my walking but the pain is still there. L butt cheek has made the least progress  PAIN:  Are you having pain? Yes: NPRS scale: 5/10 Pain location: L glute down L leg to above knee Pain description: giving out Aggravating factors: doing stairs Relieving factors: rest   OBJECTIVE:    DIAGNOSTIC FINDINGS: none yet   PATIENT SURVEYS:   Needs to be done   COGNITION:           Overall cognitive status: Within functional limits for tasks assessed                          SENSATION: WNL     POSTURE: rounded shoulders and flexed trunk    PALPATION: Lumbar paraspinals tight, R ASIS lower than L, R patella tubercle lower than left; r medial malleoli lower, bil Piriformis tight, PSIS =,L medial hamstring tighter, ischial tuberosities not TTP but are painful when she is sitting on them, L4 hypomobile and when assessed it referred pain to L glute, R sacrum deeper    LOWER EXTREMITY ROM:   Passive ROM Right eval Left eval  Hip flexion Limited to 90  degrees Limited to 90 degrees  Hip extension      Hip abduction limited limited  Hip adduction limited limited  Hip internal rotation limited limited  Hip external rotation Limited  limited  Knee flexion WNL WNL  Knee extension WNL WNL  Ankle dorsiflexion WNL WNL  Ankle plantarflexion      Ankle inversion      Ankle eversion       (Blank rows = not tested) Lumbar AROM standing: flexion limited 25%. Extension WNL All hip PROM limited all planes due to pain; pt very pain dominant bil LEs   LOWER EXTREMITY MMT:   MMT Right eval Left eval  Hip flexion 4+/5 4/5  Hip extension      Hip abduction 4/5 4/5  Hip adduction 4/5 4/5  Hip internal rotation      Hip external rotation      Knee flexion 4+/5 4/5  Knee extension 4+/5 4/5  Ankle dorsiflexion 4+/5 4/5  Ankle plantarflexion      Ankle inversion      Ankle eversion       (Blank rows = not tested)   LOWER EXTREMITY SPECIAL TESTS:  R fabers +, R crossover ITB painful, R SLR + for hamstring tightness; L fabers + for pain and limitation of motion, L SLR + for hamstring tightness, L crossover ITB painful. Thomas/Obers +bil   GAIT: Distance walked: in gym Assistive device utilized: None Level of assistance: Complete Independence Comments: minimal heel strike, walks with both knees slightly flexed, no trunk  rotation, minimal arm swing on left, walks carefully        TODAY'S TREATMENT:  [facility] Adult PT Treatment:                                                DATE: 11/22/21 Therapeutic Exercise: NuStep bil UE/LE level 3 x 5 min with PT discussion of lift Supine figure 4 2x30 sec each side Supine knee to opposite shoulder 2x30 sec each side Butterfly stretch 2x30 sec Reviewed HEP Manual Therapy: Soft tissue work/trigger point to L glute and hamstring insertion prone, L sacral PA  mob grade 2-3  Modalities: [place] 100% 2.5w/cm2 x 8 min to L glute   [facility] Adult PT Treatment:                                                DATE: 11/19/21 Therapeutic Exercise: Standing hip hinge single leg with chair support x 10 each LE , cues needed  Kneeling forearm plank x 3 x 15 sec Prone hip ext. X 10 each  Childs pose (rocking) x 5 Supine hamstring stretch 30 sec x 3  Bridging x 12 Lower trunk rotation x 10   Self care/ Manual Therapy: Prone compression to bilateral glutes, piriformis and L sacrum, sacral border L side more tender than Rt.  Quad stretch bilateral  Tennis ball L piriformis for trigger point release  [facility] Adult PT Treatment:                                                DATE: 11/15/21 Therapeutic Exercise: NuStep bil UE/LE level 1 x 6 min (attempted level 2 but increased pt's pain) L Quad set x15 L SLR x 10 L KTC 3x30 sec Leg lengthener alternating supine x 10 each side Supine figure 4 3x30 sec Supine L leg figure 4 modified with leg on bed with towel to pull upwards 3x30 sec Trunk rotation x 5 each side  Therapeutic Activity: Measured leg length with R 35 in and L 34.5 in. 1 ply heel lift issued for L shoe. Did a trial of walking with heel lift in on L with pt walking smoother and L LE not giving out.Did a trial of removing heel lift with patient having knee immediately giving out and less efficient gait. Patient prefers heel lift. PT advised her leg may be more sore due to walking  differently.    11/01/21: At Eval: See pt education section     PATIENT EDUCATION:  Education details: HEP issued Person educated: Patient Education method: Explanation Education comprehension: verbalized understanding     HOME EXERCISE PROGRAM:  Access Code: [facility] URL: [email or web address] Date: 11/19/2021 Prepared by: Karie Mainland  Exercises - Forward T with Counter Support  - 1 x daily - 7 x weekly - 2 sets - 10 reps - 5 hold - Kneeling Plank on Forearms with Scapular Protraction Retraction AROM  - 1 x daily - 7 x weekly - 1 sets - 5 reps - 10-30 sec hold - Prone Hip Extension with Pillow Under Abdomen  - 2 x daily - 7 x weekly - 2 sets - 10 reps - 5 hold - Quadruped Rocking Slow  - 2 x daily - 7 x weekly - 1 sets - 10 reps - 15 hold - Supine Bridge  - 2 x daily - 7 x weekly - 2 sets - 10 reps - 5 hold - Supine Hamstring Stretch with Strap  - 2 x daily - 7 x weekly - 1 sets - 5 reps - 30 hold - Supine Lower Trunk Rotation  - 2 x daily - 7 x weekly - 2 sets - 10 reps - 10 hold - Supine Piriformis Stretch with Foot on Ground  - 2 x daily - 7 x weekly - 1 sets - 5 reps -  30 hold - Supine Single Knee to Chest Stretch  - 2 x daily - 7 x weekly - 1 sets - 5 reps - 30 hold   ASSESSMENT:   CLINICAL IMPRESSION: Pt reports improvement with therapy but that L glute continues to be problematic area. Focus of pain seems Lt sacral region with significant trigger points. Continue to work on L glute and bil LE flexibility L > R. PT noted to perform exercises with greater ease and put on shoes without discomfort or hesitation.      OBJECTIVE IMPAIRMENTS Abnormal gait, decreased activity tolerance, decreased coordination, decreased mobility, difficulty walking, decreased ROM, decreased strength, hypomobility, increased muscle spasms, impaired flexibility, postural dysfunction, and pain.    ACTIVITY LIMITATIONS carrying, bending, sitting, standing, squatting, sleeping, stairs,  transfers, bed mobility, hygiene/grooming, locomotion level, and caring for others   PARTICIPATION LIMITATIONS: meal prep, cleaning, laundry, driving, shopping, community activity, and occupation   PERSONAL FACTORS 1 comorbidity: OA  are also affecting patient's functional outcome.    REHAB POTENTIAL: Good   CLINICAL DECISION MAKING: Evolving/moderate complexity   EVALUATION COMPLEXITY: Moderate     GOALS: Goals reviewed with patient? Yes   SHORT TERM GOALS: Target date: 11/22/2021    Pt will be independent with initial HEP Baseline:not issued at eval due to time constraints Goal status: ONGOING   2.  Pt will report ability to sit and watch TV with 25% less difficulty Baseline: unable > 10 min before change in position is needed Goal status: ONGOING     LONG TERM GOALS: Target date:  12/13/21     Pt will be able to go up the stairs without feeling like the left leg will give out >/=75% of the time Baseline: unable Goal status: INITIAL   2.  Pt will be able to walk with </=4/10 bil LE pain >/=15 min Baseline: 9/10 Goal status: INITIAL   3.  Pt will be able to stand with </= 4/10 bil LE pain without the need to sit down to wash dishes Baseline: 8/10 bil LE pain Goal status: INITIAL   4.  Pt will be able to sit >30 min with 75% less difficulty (esp at L ischial tuberosity) Baseline: 10 min; L glute more painful with sitting but both hurt Goal status: INITIAL   5.  Pt will increase bil LE strength >/=1/2 MMT grade to assist with improved functional mobility Baseline:     6.  Pt will report overall bil LE pain </=4/10 with work duties (mopping, sweeping, cleaning) Baseline: up to 8/10 Goal status: INITIAL  MMT Right eval Left eval  Hip flexion 4+/5 4/5  Hip extension      Hip abduction 4/5 4/5  Hip adduction 4/5 4/5  Hip internal rotation      Hip external rotation      Knee flexion 4+/5 4/5  Knee extension 4+/5 4/5  Ankle dorsiflexion 4+/5 4/5  Ankle  plantarflexion      Ankle inversion      Ankle eversion        Goal status: INITIAL     PLAN: PT FREQUENCY: 2x/week   PT DURATION: 6 weeks   PLANNED INTERVENTIONS: Therapeutic exercises, Therapeutic activity, Neuromuscular re-education, Gait training, Patient/Family education, Joint mobilization, Stair training, Dry Needling, Cryotherapy, Moist heat, Taping, Ultrasound, Ionotophoresis 4mg /ml Dexamethasone, Manual therapy, and Re-evaluation. I[place]TO MAY NOT BE COVERED BY [employer]-IF NOT, ONLY USE IF DISCUSS WITH PATIENT   PLAN FOR NEXT SESSION: continue to work on bil LE flexibility with  emphasis on L, Piriformis stretching             Luna Fuse, PT 11/22/2021, 12:10 PM  [facility] [address] [place], [place], [address] Phone: [phone]   Fax:  [phone]   Luna Fuse, PT 11/22/2021, 12:10 PM  [facility] [address] [place], [place], [address] Phone: [phone]   Fax:  [phone]

## 2021-11-26 ENCOUNTER — Encounter: Payer: Medicare HMO | Admitting: Physical Therapy

## 2021-11-27 ENCOUNTER — Ambulatory Visit: Payer: Medicare HMO

## 2021-11-27 DIAGNOSIS — M79605 Pain in left leg: Secondary | ICD-10-CM

## 2021-11-27 DIAGNOSIS — R2689 Other abnormalities of gait and mobility: Secondary | ICD-10-CM | POA: Diagnosis not present

## 2021-11-27 DIAGNOSIS — M79604 Pain in right leg: Secondary | ICD-10-CM

## 2021-11-27 DIAGNOSIS — M5459 Other low back pain: Secondary | ICD-10-CM | POA: Diagnosis not present

## 2021-11-27 NOTE — Therapy (Signed)
[facility] [address] [place], [place], [address] Phone: [phone]   Fax:  [phone]  Patient Details  Name: BREUNNA NORDMANN MRN: [id number] Date of Birth: 04/16/1951 Referring Provider:  Lavonda Jumbo, DO  Encounter Date: 11/27/2021   OUTPATIENT PHYSICAL THERAPY TREATMENT NOTE   Patient Name: Candice Warner MRN: [id number] DOB:01-Aug-1950, 71 y.o., female Today's Date: 11/27/2021  PCP: Lavonda Jumbo, DO REFERRING PROVIDER: Denny Levy, MD  PT End of Session - 11/27/21 1658     Visit Number 5    Number of Visits 10    Date for PT Re-Evaluation 12/13/21    Authorization Type [employer] MCR    Authorization Time Period FOTO v6, v10    Progress Note Due on Visit 10    PT Start Time 1700    PT Stop Time 1742    PT Time Calculation (min) 42 min    Activity Tolerance Patient tolerated treatment well;No increased pain    Behavior During Therapy [facility] for tasks assessed/performed                Past Medical History:  Diagnosis Date   Arthritis    "right hip" (06/27/2018)   Depression    Hyperlipidemia    Hypertension    Hypokalemia    Hypothyroidism    Lumbar back pain    Obesity    Radiculopathy    Thyroid disease    Type II diabetes mellitus (HCC)    Past Surgical History:  Procedure Laterality Date   CYST REMOVAL HAND Right 11/07/2015   Procedure: EXCISION OF VOLAR RETANICULUM RIGHT HAND;  Surgeon: Loreta Ave, MD;  Location: [facility];  Service: Orthopedics;  Laterality: Right;   SHOULDER ARTHROSCOPY Left 2002   SHOULDER ARTHROSCOPY WITH ROTATOR CUFF REPAIR AND SUBACROMIAL DECOMPRESSION  07/14/2012   Procedure: SHOULDER ARTHROSCOPY WITH ROTATOR CUFF REPAIR AND SUBACROMIAL DECOMPRESSION;  Surgeon: Loreta Ave, MD;  Location: [facility];  Service: Orthopedics;  Laterality: Right;  Right Shoulder Arthroscopy, Distal Claviculectomy, Subacromial Decompression,  Partial Acromioplasty with Coracoacromial Release with Arthroscopic Rotator Cuff Repair    THYROID LOBECTOMY Right    Goiter s/p right thyroidectomy   TRIGGER FINGER RELEASE Right 11/07/2015   Procedure: RELEASE TRIGGER FINGER/A-1 PULLEY RIGHT RING FINGER ;  Surgeon: Loreta Ave, MD;  Location: [facility];  Service: Orthopedics;  Laterality: Right;   TUBAL LIGATION     Patient Active Problem List   Diagnosis Date Noted   Calcification of left breast 06/26/2019   Osteoarthritis 03/15/2019   Nodule of chest wall 06/21/2015   Trigger finger, acquired 10/23/2014   Right hip pain 06/01/2011   HLD (hyperlipidemia) 04/16/2009   Type 2 diabetes mellitus without complication, without long-term current use of insulin (HCC) 10/29/2008   DEGENERATIVE JOINT DISEASE, HIPS 10/29/2008   Hypothyroidism 08/12/2006   OBESITY, NOS 08/12/2006   Major depressive disorder, recurrent episode (HCC) 08/12/2006   HYPERTENSION, BENIGN SYSTEMIC 08/12/2006   DIVERTICULOSIS OF COLON 08/12/2006    REFERRING DIAG: Hamstring tightness bil LEs  THERAPY DIAG:  Pain in left leg  Other abnormalities of gait and mobility  Other low back pain  Pain in right leg  Rationale for Evaluation and Treatment Rehabilitation  PERTINENT HISTORY: Per chart review 09/10/21: Continued leg pain for 2 months. Worsening with recent hospitalization for pyelonephritis but has improved significantly since being discharge. Worse with climbing stairs and getting out of the car. She has been trying  stretching exercises at home. She denies tingling, numbness. Denies hip and knee pain. Pain is located at the back of thighs bilaterally  PRECAUTIONS: none  SUBJECTIVE:  Pt reports continued Lt buttocks/ posterior thigh pain today. Pt reports daily adherence to her HEP.  PAIN:  Are you having pain? Yes: NPRS scale: 5/10 Pain location: L glute down L leg to above knee Pain description: giving out Aggravating factors:  doing stairs Relieving factors: rest   OBJECTIVE:    DIAGNOSTIC FINDINGS: none yet   PATIENT SURVEYS:  Needs to be done   COGNITION:           Overall cognitive status: Within functional limits for tasks assessed                          SENSATION: WNL     POSTURE: rounded shoulders and flexed trunk    PALPATION: Lumbar paraspinals tight, R ASIS lower than L, R patella tubercle lower than left; r medial malleoli lower, bil Piriformis tight, PSIS =,L medial hamstring tighter, ischial tuberosities not TTP but are painful when she is sitting on them, L4 hypomobile and when assessed it referred pain to L glute, R sacrum deeper    LOWER EXTREMITY ROM:   Passive ROM Right eval Left eval  Hip flexion Limited to 90  degrees Limited to 90 degrees  Hip extension      Hip abduction limited limited  Hip adduction limited limited  Hip internal rotation limited limited  Hip external rotation Limited  limited  Knee flexion WNL WNL  Knee extension WNL WNL  Ankle dorsiflexion WNL WNL  Ankle plantarflexion      Ankle inversion      Ankle eversion       (Blank rows = not tested) Lumbar AROM standing: flexion limited 25%. Extension WNL All hip PROM limited all planes due to pain; pt very pain dominant bil LEs   LOWER EXTREMITY MMT:   MMT Right eval Left eval  Hip flexion 4+/5 4/5  Hip extension      Hip abduction 4/5 4/5  Hip adduction 4/5 4/5  Hip internal rotation      Hip external rotation      Knee flexion 4+/5 4/5  Knee extension 4+/5 4/5  Ankle dorsiflexion 4+/5 4/5  Ankle plantarflexion      Ankle inversion      Ankle eversion       (Blank rows = not tested)   LOWER EXTREMITY SPECIAL TESTS:  R fabers +, R crossover ITB painful, R SLR + for hamstring tightness; L fabers + for pain and limitation of motion, L SLR + for hamstring tightness, L crossover ITB painful. Thomas/Obers +bil   GAIT: Distance walked: in gym Assistive device utilized: None Level of  assistance: Complete Independence Comments: minimal heel strike, walks with both knees slightly flexed, no trunk rotation, minimal arm swing on left, walks carefully        TODAY'S TREATMENT:  [facility] Adult PT Treatment:                                                DATE: 11/27/2021 Therapeutic Exercise: Seated piriformis stretch x96min BIL Supine 90/90 abdominal isometric with handhold resistance 4x30sec Standing hip extension with 7# cable to ankle attachment at Free Motion machine 2x10 BIL Standing hamstring  curl with 7# cable to ankle attachment at Free Motion machine 2x10 BIL Standing hip abduction with 3# cable to ankle attachment at Free Motion machine 2x10 BIL Romanian deadlift with 45# bar 3x6 Standing IT band stretch with overhead reach and UE support x7min BIL Manual Therapy: N/A Neuromuscular re-ed: N/A Therapeutic Activity: N/A Modalities: N/A Self Care: N/A   [facility] Adult PT Treatment:                                                DATE: 11/22/21 Therapeutic Exercise: NuStep bil UE/LE level 3 x 5 min with PT discussion of lift Supine figure 4 2x30 sec each side Supine knee to opposite shoulder 2x30 sec each side Butterfly stretch 2x30 sec Reviewed HEP Manual Therapy: Soft tissue work/trigger point to L glute and hamstring insertion prone, L sacral PA mob grade 2-3  Modalities: [place] 100% 2.5w/cm2 x 8 min to L glute   [facility] Adult PT Treatment:                                                DATE: 11/19/21 Therapeutic Exercise: Standing hip hinge single leg with chair support x 10 each LE , cues needed  Kneeling forearm plank x 3 x 15 sec Prone hip ext. X 10 each  Childs pose (rocking) x 5 Supine hamstring stretch 30 sec x 3  Bridging x 12 Lower trunk rotation x 10   Self care/ Manual Therapy: Prone compression to bilateral glutes, piriformis and L sacrum, sacral border L side more tender than Rt.  Quad stretch bilateral  Tennis ball L piriformis for trigger  point release      PATIENT EDUCATION:  Education details: HEP issued Person educated: Patient Education method: Explanation Education comprehension: verbalized understanding     HOME EXERCISE PROGRAM:  Access Code: [facility] URL: [email or web address] Date: 11/19/2021 Prepared by: Raeford Razor  Exercises - Forward T with Counter Support  - 1 x daily - 7 x weekly - 2 sets - 10 reps - 5 hold - Kneeling Plank on Forearms with Scapular Protraction Retraction AROM  - 1 x daily - 7 x weekly - 1 sets - 5 reps - 10-30 sec hold - Prone Hip Extension with Pillow Under Abdomen  - 2 x daily - 7 x weekly - 2 sets - 10 reps - 5 hold - Quadruped Rocking Slow  - 2 x daily - 7 x weekly - 1 sets - 10 reps - 15 hold - Supine Bridge  - 2 x daily - 7 x weekly - 2 sets - 10 reps - 5 hold - Supine Hamstring Stretch with Strap  - 2 x daily - 7 x weekly - 1 sets - 5 reps - 30 hold - Supine Lower Trunk Rotation  - 2 x daily - 7 x weekly - 2 sets - 10 reps - 10 hold - Supine Piriformis Stretch with Foot on Ground  - 2 x daily - 7 x weekly - 1 sets - 5 reps - 30 hold - Supine Single Knee to Chest Stretch  - 2 x daily - 7 x weekly - 1 sets - 5 reps - 30 hold   ASSESSMENT:   CLINICAL IMPRESSION: Pt responded  excellently to all new exercises today. She reports a therapeutic response to seated piriformis stretching and a good challenge with new hip and hamstring strengthening exercises. She will continue to benefit from skilled PT to address her primary impairments and return to her prior level of function with less limitation.     OBJECTIVE IMPAIRMENTS Abnormal gait, decreased activity tolerance, decreased coordination, decreased mobility, difficulty walking, decreased ROM, decreased strength, hypomobility, increased muscle spasms, impaired flexibility, postural dysfunction, and pain.    ACTIVITY LIMITATIONS carrying, bending, sitting, standing, squatting, sleeping, stairs, transfers, bed  mobility, hygiene/grooming, locomotion level, and caring for others   PARTICIPATION LIMITATIONS: meal prep, cleaning, laundry, driving, shopping, community activity, and occupation   PERSONAL FACTORS 1 comorbidity: OA  are also affecting patient's functional outcome.         GOALS: Goals reviewed with patient? Yes   SHORT TERM GOALS: Target date: 11/22/2021    Pt will be independent with initial HEP Baseline:not issued at eval due to time constraints 11/27/2021: Pt reports daily adherence to her HEP Goal status: ACHIEVED   2.  Pt will report ability to sit and watch TV with 25% less difficulty Baseline: unable > 10 min before change in position is needed Goal status: ONGOING     LONG TERM GOALS: Target date:  12/13/21     Pt will be able to go up the stairs without feeling like the left leg will give out >/=75% of the time Baseline: unable Goal status: INITIAL   2.  Pt will be able to walk with </=4/10 bil LE pain >/=15 min Baseline: 9/10 Goal status: INITIAL   3.  Pt will be able to stand with </= 4/10 bil LE pain without the need to sit down to wash dishes Baseline: 8/10 bil LE pain Goal status: INITIAL   4.  Pt will be able to sit >30 min with 75% less difficulty (esp at L ischial tuberosity) Baseline: 10 min; L glute more painful with sitting but both hurt Goal status: INITIAL   5.  Pt will increase bil LE strength >/=1/2 MMT grade to assist with improved functional mobility Baseline:     6.  Pt will report overall bil LE pain </=4/10 with work duties (mopping, sweeping, cleaning) Baseline: up to 8/10 Goal status: INITIAL  MMT Right eval Left eval  Hip flexion 4+/5 4/5  Hip extension      Hip abduction 4/5 4/5  Hip adduction 4/5 4/5  Hip internal rotation      Hip external rotation      Knee flexion 4+/5 4/5  Knee extension 4+/5 4/5  Ankle dorsiflexion 4+/5 4/5  Ankle plantarflexion      Ankle inversion      Ankle eversion        Goal status:  INITIAL     PLAN: PT FREQUENCY: 2x/week   PT DURATION: 6 weeks   PLANNED INTERVENTIONS: Therapeutic exercises, Therapeutic activity, Neuromuscular re-education, Gait training, Patient/Family education, Joint mobilization, Stair training, Dry Needling, Cryotherapy, Moist heat, Taping, Ultrasound, Ionotophoresis 4mg /ml Dexamethasone, Manual therapy, and Re-evaluation. [place] MAY NOT BE COVERED BY [employer]-IF NOT, ONLY USE IF DISCUSS WITH PATIENT   PLAN FOR NEXT SESSION: continue to work on bil LE flexibility with emphasis on L, Piriformis stretching      , PT 11/27/2021, 5:42 PM  [facility] [address] [place], [place], [place] Phone: [phone]   Fax:  [phone]

## 2021-11-29 ENCOUNTER — Ambulatory Visit: Payer: Medicare HMO

## 2021-12-01 DIAGNOSIS — M1990 Unspecified osteoarthritis, unspecified site: Secondary | ICD-10-CM | POA: Diagnosis not present

## 2021-12-01 DIAGNOSIS — Z7984 Long term (current) use of oral hypoglycemic drugs: Secondary | ICD-10-CM | POA: Diagnosis not present

## 2021-12-01 DIAGNOSIS — Z8249 Family history of ischemic heart disease and other diseases of the circulatory system: Secondary | ICD-10-CM | POA: Diagnosis not present

## 2021-12-01 DIAGNOSIS — E119 Type 2 diabetes mellitus without complications: Secondary | ICD-10-CM | POA: Diagnosis not present

## 2021-12-01 DIAGNOSIS — Z833 Family history of diabetes mellitus: Secondary | ICD-10-CM | POA: Diagnosis not present

## 2021-12-01 DIAGNOSIS — E039 Hypothyroidism, unspecified: Secondary | ICD-10-CM | POA: Diagnosis not present

## 2021-12-01 DIAGNOSIS — F419 Anxiety disorder, unspecified: Secondary | ICD-10-CM | POA: Diagnosis not present

## 2021-12-01 DIAGNOSIS — I10 Essential (primary) hypertension: Secondary | ICD-10-CM | POA: Diagnosis not present

## 2021-12-01 DIAGNOSIS — E785 Hyperlipidemia, unspecified: Secondary | ICD-10-CM | POA: Diagnosis not present

## 2021-12-01 DIAGNOSIS — Z7982 Long term (current) use of aspirin: Secondary | ICD-10-CM | POA: Diagnosis not present

## 2021-12-01 DIAGNOSIS — R69 Illness, unspecified: Secondary | ICD-10-CM | POA: Diagnosis not present

## 2021-12-03 ENCOUNTER — Ambulatory Visit: Payer: Medicare HMO

## 2021-12-03 ENCOUNTER — Encounter: Payer: Medicare HMO | Admitting: Physical Therapy

## 2021-12-03 DIAGNOSIS — M5459 Other low back pain: Secondary | ICD-10-CM | POA: Diagnosis not present

## 2021-12-03 DIAGNOSIS — M79604 Pain in right leg: Secondary | ICD-10-CM | POA: Diagnosis not present

## 2021-12-03 DIAGNOSIS — R2689 Other abnormalities of gait and mobility: Secondary | ICD-10-CM | POA: Diagnosis not present

## 2021-12-03 DIAGNOSIS — M79605 Pain in left leg: Secondary | ICD-10-CM | POA: Diagnosis not present

## 2021-12-03 NOTE — Therapy (Signed)
[place], [place], [address] Phone: [phone]   Fax:  [phone]  Patient Details  Name: Candice Warner MRN: [id number] Date of Birth: 1950/09/18 Referring Provider:  Gerlene Fee, DO  Encounter Date: 12/03/2021   OUTPATIENT PHYSICAL THERAPY TREATMENT NOTE   Patient Name: Candice Warner MRN: [id number] DOB:1950/08/16, 71 y.o., female Today's Date: 12/03/2021  PCP: Gerlene Fee, DO REFERRING PROVIDER: Dorcas Mcmurray, MD  PT End of Session - 12/03/21 1700     Visit Number 6    Number of Visits 10    Date for PT Re-Evaluation 12/13/21    Authorization Type [employer] MCR    Authorization Time Period FOTO v6, v10    Progress Note Due on Visit 10    PT Start Time 1700    PT Stop Time 1740    PT Time Calculation (min) 40 min    Activity Tolerance Patient tolerated treatment well;No increased pain    Behavior During Therapy  [facility] for tasks assessed/performed                 Past Medical History:  Diagnosis Date   Arthritis    "right hip" (06/27/2018)   Depression    Hyperlipidemia    Hypertension    Hypokalemia    Hypothyroidism    Lumbar back pain    Obesity    Radiculopathy    Thyroid disease    Type II diabetes mellitus ([place])    Past Surgical History:  Procedure Laterality Date   CYST REMOVAL HAND Right 11/07/2015   Procedure: EXCISION OF VOLAR RETANICULUM RIGHT HAND;  Surgeon: Ninetta Lights, MD;  Location: [facility];  Service: Orthopedics;  Laterality: Right;   SHOULDER ARTHROSCOPY Left 2002   SHOULDER ARTHROSCOPY WITH ROTATOR CUFF REPAIR AND SUBACROMIAL DECOMPRESSION  07/14/2012   Procedure: SHOULDER ARTHROSCOPY WITH ROTATOR CUFF REPAIR AND SUBACROMIAL DECOMPRESSION;  Surgeon: Ninetta Lights, MD;  Location: [place];  Service: Orthopedics;  Laterality: Right;  Right Shoulder Arthroscopy, Distal Claviculectomy, Subacromial Decompression,  Partial Acromioplasty with Coracoacromial Release with Arthroscopic Rotator Cuff Repair    THYROID LOBECTOMY Right    Goiter s/p right thyroidectomy   TRIGGER FINGER RELEASE Right 11/07/2015   Procedure: RELEASE TRIGGER FINGER/A-1 PULLEY RIGHT RING FINGER ;  Surgeon: Ninetta Lights, MD;  Location: [facility];  Service: Orthopedics;  Laterality: Right;   TUBAL LIGATION     Patient Active Problem List   Diagnosis Date Noted   Calcification of left breast 06/26/2019   Osteoarthritis 03/15/2019   Nodule of chest wall 06/21/2015   Trigger finger, acquired 10/23/2014   Right hip pain 06/01/2011   HLD (hyperlipidemia) 04/16/2009   Type 2 diabetes mellitus without complication, without long-term current use of insulin ([place]) 10/29/2008   DEGENERATIVE JOINT DISEASE, HIPS 10/29/2008   Hypothyroidism 08/12/2006   OBESITY, NOS 08/12/2006   Major depressive disorder, recurrent episode ([place]) 08/12/2006   HYPERTENSION, BENIGN SYSTEMIC 08/12/2006   DIVERTICULOSIS OF COLON 08/12/2006    REFERRING DIAG: Hamstring tightness bil LEs  THERAPY DIAG:  Pain in left leg  Other abnormalities of gait and mobility  Other low back pain  Pain in right leg  Rationale for Evaluation and Treatment Rehabilitation  PERTINENT HISTORY: Per chart review 09/10/21: Continued leg pain for 2 months. Worsening with recent hospitalization for pyelonephritis but has improved significantly since being discharge. Worse with climbing stairs and getting out of the car. She has been  trying stretching exercises at home. She denies tingling, numbness. Denies hip and knee pain. Pain is located at the back of thighs bilaterally  PRECAUTIONS: none  SUBJECTIVE:  Pt reports continued LBP today, although she reports noticing an overall improvement in symptoms. She reports regular adherence to her HEP.  PAIN:  Are you having pain? Yes: NPRS scale: 6/10 Pain location: L glute down L leg to above knee Pain  description: giving out Aggravating factors: doing stairs Relieving factors: rest   OBJECTIVE:    DIAGNOSTIC FINDINGS: none yet   PATIENT SURVEYS:  FOTO 11/15/2021: 21%, predicted 50% in 16 visits FOTO 12/03/2021: 36%   COGNITION:           Overall cognitive status: Within functional limits for tasks assessed                          SENSATION: WNL     POSTURE: rounded shoulders and flexed trunk    PALPATION: Lumbar paraspinals tight, R ASIS lower than L, R patella tubercle lower than left; r medial malleoli lower, bil Piriformis tight, PSIS =,L medial hamstring tighter, ischial tuberosities not TTP but are painful when she is sitting on them, L4 hypomobile and when assessed it referred pain to L glute, R sacrum deeper    LOWER EXTREMITY ROM:   Passive ROM Right eval Left eval  Hip flexion Limited to 90  degrees Limited to 90 degrees  Hip extension      Hip abduction limited limited  Hip adduction limited limited  Hip internal rotation limited limited  Hip external rotation Limited  limited  Knee flexion WNL WNL  Knee extension WNL WNL  Ankle dorsiflexion WNL WNL  Ankle plantarflexion      Ankle inversion      Ankle eversion       (Blank rows = not tested) Lumbar AROM standing: flexion limited 25%. Extension WNL All hip PROM limited all planes due to pain; pt very pain dominant bil LEs   LOWER EXTREMITY MMT:   MMT Right eval Left eval Right 12/03/2021 Left 12/03/2021  Hip flexion 4+/5 4/5 4+/5 4+/5  Hip extension        Hip abduction 4/5 4/5 5/5 4+/5  Hip adduction 4/5 4/5 5/5 5/5  Hip internal rotation        Hip external rotation        Knee flexion 4+/5 4/5 5/5 4/5 Lt hip pain  Knee extension 4+/5 4/5 5/5 5/5  Ankle dorsiflexion 4+/5 4/5 5/5 5/5  Ankle plantarflexion        Ankle inversion        Ankle eversion         (Blank rows = not tested)   LOWER EXTREMITY SPECIAL TESTS:  R fabers +, R crossover ITB painful, R SLR + for hamstring tightness; L  fabers + for pain and limitation of motion, L SLR + for hamstring tightness, L crossover ITB painful. Thomas/Obers +bil   GAIT: Distance walked: in gym Assistive device utilized: None Level of assistance: Complete Independence Comments: minimal heel strike, walks with both knees slightly flexed, no trunk rotation, minimal arm swing on left, walks carefully        TODAY'S TREATMENT:  [facility] Adult PT Treatment:  DATE: 12/03/2021 Therapeutic Exercise: Supine with lower legs off end of table, BIL hip flexion with slow return 3x8 Supine 90/90 abdominal isometric with handhold resistance 3x30 seconds Prone alternating hip extension 3x20 Standing hip extension with 7# cable to ankle attachment at Free Motion machine 2x10 BIL Standing hamstring curl with 7# cable to ankle attachment at Free Motion machine 2x10 BIL Standing hip abduction with 7# cable to ankle attachment at Free Motion machine 2x10 BIL Standing Pallof press with 10# cable x10 with 5-sec hold BIL Standing trunk rotation x10 BIL Manual Therapy: N/A Neuromuscular re-ed: N/A Therapeutic Activity: Re-assessment of objective measures with pt education Re-administration of FOTO with pt education Modalities: N/A Self Care: N/A   [facility] Adult PT Treatment:                                                DATE: 11/27/2021 Therapeutic Exercise: Seated piriformis stretch x70min BIL Supine 90/90 abdominal isometric with handhold resistance 4x30sec Standing hip extension with 7# cable to ankle attachment at Free Motion machine 2x10 BIL Standing hamstring curl with 7# cable to ankle attachment at Free Motion machine 2x10 BIL Standing hip abduction with 3# cable to ankle attachment at Free Motion machine 2x10 BIL [place] deadlift with 45# bar 3x6 Standing IT band stretch with overhead reach and UE support x54min BIL Manual Therapy: N/A Neuromuscular re-ed: N/A Therapeutic  Activity: N/A Modalities: N/A Self Care: N/A   [facility] Adult PT Treatment:                                                DATE: 11/22/21 Therapeutic Exercise: NuStep bil UE/LE level 3 x 5 min with PT discussion of lift Supine figure 4 2x30 sec each side Supine knee to opposite shoulder 2x30 sec each side Butterfly stretch 2x30 sec Reviewed HEP Manual Therapy: Soft tissue work/trigger point to L glute and hamstring insertion prone, L sacral PA mob grade 2-3  Modalities: [place] 100% 2.5w/cm2 x 8 min to L glute       PATIENT EDUCATION:  Education details: HEP issued Person educated: Patient Education method: Explanation Education comprehension: verbalized understanding     HOME EXERCISE PROGRAM:  Access Code: [facility] URL: [email or web address] Date: 11/19/2021 Prepared by: Karie Mainland  Exercises - Forward T with Counter Support  - 1 x daily - 7 x weekly - 2 sets - 10 reps - 5 hold - Kneeling Plank on Forearms with Scapular Protraction Retraction AROM  - 1 x daily - 7 x weekly - 1 sets - 5 reps - 10-30 sec hold - Prone Hip Extension with Pillow Under Abdomen  - 2 x daily - 7 x weekly - 2 sets - 10 reps - 5 hold - Quadruped Rocking Slow  - 2 x daily - 7 x weekly - 1 sets - 10 reps - 15 hold - Supine Bridge  - 2 x daily - 7 x weekly - 2 sets - 10 reps - 5 hold - Supine Hamstring Stretch with Strap  - 2 x daily - 7 x weekly - 1 sets - 5 reps - 30 hold - Supine Lower Trunk Rotation  - 2 x daily - 7 x weekly - 2 sets - 10 reps -  10 hold - Supine Piriformis Stretch with Foot on Ground  - 2 x daily - 7 x weekly - 1 sets - 5 reps - 30 hold - Supine Single Knee to Chest Stretch  - 2 x daily - 7 x weekly - 1 sets - 5 reps - 30 hold   ASSESSMENT:   CLINICAL IMPRESSION: Pt responded well to all interventions today, demonstrating good form and no increase in pain with progressed exercises. Upon re-assessment of pt goals, the pt has made excellent progress in FOTO score, BIL  hip/ knee strength, and functional walking, standing, and sitting capacity, although she remains limited in these functions. She will continue to benefit from skilled PT to address her primary impairments and return to her prior level of function with less limitation.      OBJECTIVE IMPAIRMENTS Abnormal gait, decreased activity tolerance, decreased coordination, decreased mobility, difficulty walking, decreased ROM, decreased strength, hypomobility, increased muscle spasms, impaired flexibility, postural dysfunction, and pain.    ACTIVITY LIMITATIONS carrying, bending, sitting, standing, squatting, sleeping, stairs, transfers, bed mobility, hygiene/grooming, locomotion level, and caring for others   PARTICIPATION LIMITATIONS: meal prep, cleaning, laundry, driving, shopping, community activity, and occupation   PERSONAL FACTORS 1 comorbidity: OA  are also affecting patient's functional outcome.         GOALS: Goals reviewed with patient? Yes   SHORT TERM GOALS: Target date: 11/22/2021    Pt will be independent with initial HEP Baseline:not issued at eval due to time constraints 11/27/2021: Pt reports daily adherence to her HEP Goal status: ACHIEVED   2.  Pt will report ability to sit and watch TV with 25% less difficulty Baseline: unable > 10 min before change in position is needed Goal status: ONGOING     LONG TERM GOALS: Target date:  12/13/21     Pt will be able to go up the stairs without feeling like the left leg will give out >/=75% of the time Baseline: unable Goal status: INITIAL   2.  Pt will be able to walk with </=4/10 bil LE pain >/=15 min Baseline: 9/10 12/03/2021: Pt reports being able to walk 10 minutes before needing a seated rest break due to pain Goal status: PROGRESSING   3.  Pt will be able to stand with </= 4/10 bil LE pain without the need to sit down to wash dishes Baseline: 8/10 bil LE pain 12/03/2021: Pt reports being able to stand 7-10 minutes before  needing to sit due to pain Goal status: PROGRESSING   4.  Pt will be able to sit >30 min with 75% less difficulty (esp at L ischial tuberosity) Baseline: 10 min; L glute more painful with sitting but both hurt 12/03/2021: 15 minutes Goal status: PROGRESSING   5.  Pt will increase bil LE strength >/=1/2 MMT grade to assist with improved functional mobility Baseline:  MMT Right eval Left eval  Hip flexion 4+/5 4/5  Hip extension      Hip abduction 4/5 4/5  Hip adduction 4/5 4/5  Hip internal rotation      Hip external rotation      Knee flexion 4+/5 4/5  Knee extension 4+/5 4/5  Ankle dorsiflexion 4+/5 4/5  Ankle plantarflexion      Ankle inversion      Ankle eversion        12/03/2021: See updated MMT chart Goal status: PROGRESSING    6.  Pt will report overall bil LE pain </=4/10 with work duties (mopping, sweeping, cleaning)  Baseline: up to 8/10 12/03/2021: 5/10 pain Goal status: PROGRESSING       PLAN: PT FREQUENCY: 2x/week   PT DURATION: 6 weeks   PLANNED INTERVENTIONS: Therapeutic exercises, Therapeutic activity, Neuromuscular re-education, Gait training, Patient/Family education, Joint mobilization, Stair training, Dry Needling, Cryotherapy, Moist heat, Taping, Ultrasound, Ionotophoresis 4mg /ml Dexamethasone, Manual therapy, and Re-evaluation. [place]ONTO MAY NOT BE COVERED BY [employer]-IF NOT, ONLY USE IF DISCUSS WITH PATIENT   PLAN FOR NEXT SESSION: continue to work on bil LE flexibility with emphasis on L, Piriformis stretching      , PT, DPT 12/03/21 5:40 PM   [facility] [address] [place], [place], [place] Phone: [phone]   Fax:  [phone]

## 2021-12-06 ENCOUNTER — Ambulatory Visit: Payer: Medicare HMO

## 2021-12-06 DIAGNOSIS — M5459 Other low back pain: Secondary | ICD-10-CM | POA: Diagnosis not present

## 2021-12-06 DIAGNOSIS — M79604 Pain in right leg: Secondary | ICD-10-CM | POA: Diagnosis not present

## 2021-12-06 DIAGNOSIS — R2689 Other abnormalities of gait and mobility: Secondary | ICD-10-CM | POA: Diagnosis not present

## 2021-12-06 DIAGNOSIS — M79605 Pain in left leg: Secondary | ICD-10-CM | POA: Diagnosis not present

## 2021-12-10 ENCOUNTER — Ambulatory Visit: Payer: Medicare HMO

## 2021-12-10 ENCOUNTER — Encounter: Payer: Medicare HMO | Admitting: Physical Therapy

## 2021-12-13 ENCOUNTER — Encounter: Payer: Self-pay | Admitting: Rehabilitative and Restorative Service Providers"

## 2021-12-13 ENCOUNTER — Ambulatory Visit: Payer: Medicare HMO | Attending: Family Medicine | Admitting: Rehabilitative and Restorative Service Providers"

## 2021-12-13 DIAGNOSIS — M79605 Pain in left leg: Secondary | ICD-10-CM | POA: Insufficient documentation

## 2021-12-13 DIAGNOSIS — R2689 Other abnormalities of gait and mobility: Secondary | ICD-10-CM | POA: Insufficient documentation

## 2021-12-13 DIAGNOSIS — M5459 Other low back pain: Secondary | ICD-10-CM | POA: Diagnosis not present

## 2021-12-13 DIAGNOSIS — M79604 Pain in right leg: Secondary | ICD-10-CM | POA: Insufficient documentation

## 2021-12-13 NOTE — Therapy (Signed)
[facility] [address] [place], [place], [address] Phone: [phone]   Fax:  [phone]  Patient Details  Name: Candice Warner MRN: [id number] Date of Birth: 1950/10/17 Referring Provider:  Lavonda Jumbo, DO  Encounter Date: 12/13/2021    [facility] [address] [place], [place], [address] Phone: [phone]   Fax:  [phone]  Patient Details  Name: Candice Warner MRN: [id number] Date of Birth: 05/30/1951 Referring Provider:  Lavonda Jumbo, DO  Encounter Date: 12/13/2021   OUTPATIENT PHYSICAL THERAPY TREATMENT NOTE/Re-eval   Patient Name: Candice Warner MRN: [id number] DOB:Sep 18, 1950, 71 y.o., female Today's Date: 12/13/2021  PCP: Lavonda Jumbo, DO REFERRING PROVIDER: Denny Levy, MD  PT End of Session - 12/13/21 1123     Visit Number 8    Number of Visits 10    PT Start Time 1120    PT Stop Time 1203    PT Time Calculation (min) 43 min    Activity Tolerance Patient tolerated treatment well;No increased pain    Behavior During Therapy [facility] for tasks assessed/performed                   Past Medical History:  Diagnosis Date   Arthritis    "right hip" (06/27/2018)   Depression    Hyperlipidemia    Hypertension    Hypokalemia    Hypothyroidism    Lumbar back pain    Obesity    Radiculopathy    Thyroid disease    Type II diabetes mellitus (HCC)    Past Surgical History:  Procedure Laterality Date   CYST REMOVAL HAND Right 11/07/2015   Procedure: EXCISION OF VOLAR RETANICULUM RIGHT HAND;  Surgeon: Loreta Ave, MD;  Location: [facility];  Service: Orthopedics;  Laterality: Right;   SHOULDER ARTHROSCOPY Left 2002   SHOULDER ARTHROSCOPY WITH ROTATOR CUFF REPAIR AND SUBACROMIAL DECOMPRESSION  07/14/2012   Procedure: SHOULDER ARTHROSCOPY WITH ROTATOR CUFF REPAIR AND SUBACROMIAL DECOMPRESSION;  Surgeon: Loreta Ave,  MD;  Location: [facility];  Service: Orthopedics;  Laterality: Right;  Right Shoulder Arthroscopy, Distal Claviculectomy, Subacromial Decompression, Partial Acromioplasty with Coracoacromial Release with Arthroscopic Rotator Cuff Repair    THYROID LOBECTOMY Right    Goiter s/p right thyroidectomy   TRIGGER FINGER RELEASE Right 11/07/2015   Procedure: RELEASE TRIGGER FINGER/A-1 PULLEY RIGHT RING FINGER ;  Surgeon: Loreta Ave, MD;  Location: [facility];  Service: Orthopedics;  Laterality: Right;   TUBAL LIGATION     Patient Active Problem List   Diagnosis Date Noted   Calcification of left breast 06/26/2019   Osteoarthritis 03/15/2019   Nodule of chest wall 06/21/2015   Trigger finger, acquired 10/23/2014   Right hip pain 06/01/2011   HLD (hyperlipidemia) 04/16/2009   Type 2 diabetes mellitus without complication, without long-term current use of insulin (HCC) 10/29/2008   DEGENERATIVE JOINT DISEASE, HIPS 10/29/2008   Hypothyroidism 08/12/2006   OBESITY, NOS 08/12/2006   Major depressive disorder, recurrent episode (HCC) 08/12/2006   HYPERTENSION, BENIGN SYSTEMIC 08/12/2006   DIVERTICULOSIS OF COLON 08/12/2006    REFERRING DIAG: Hamstring tightness bil LEs  THERAPY DIAG:  Pain in left leg  Other abnormalities of gait and mobility  Other low back pain  Pain in right leg  Rationale for Evaluation and Treatment Rehabilitation  PERTINENT HISTORY: Per chart review 09/10/21: Continued leg pain for 2 months. Worsening with recent hospitalization for pyelonephritis but has improved significantly since  being discharge. Worse with climbing stairs and getting out of the car. She has been trying stretching exercises at home. She denies tingling, numbness. Denies hip and knee pain. Pain is located at the back of thighs bilaterally  PRECAUTIONS: none  SUBJECTIVE:  Pt reports no back pain today, although she continues to have moderate Lt glute and  posterior thigh pain. She reports adherence to her HEP. She states that her thigh pain has remained unchanged.  PAIN:  Are you having pain? Yes: NPRS scale: 5/10 Pain location: L glute down L leg to above knee Pain description: giving out Aggravating factors: doing stairs Relieving factors: rest   OBJECTIVE:    DIAGNOSTIC FINDINGS: none yet   PATIENT SURVEYS:  FOTO 11/15/2021: 21%, predicted 50% in 16 visits FOTO 12/03/2021: 36%   COGNITION:           Overall cognitive status: Within functional limits for tasks assessed                          SENSATION: WNL     POSTURE: rounded shoulders and flexed trunk    PALPATION: Lumbar paraspinals tight, R ASIS lower than L, R patella tubercle lower than left; r medial malleoli lower, bil Piriformis tight, PSIS =,L medial hamstring tighter, ischial tuberosities not TTP but are painful when she is sitting on them, L4 hypomobile and when assessed it referred pain to L glute, R sacrum deeper    LOWER EXTREMITY ROM:   Passive ROM Right eval Left eval  Hip flexion Limited to 90  degrees Limited to 90 degrees  Hip extension      Hip abduction limited limited  Hip adduction limited limited  Hip internal rotation limited limited  Hip external rotation Limited  limited  Knee flexion WNL WNL  Knee extension WNL WNL  Ankle dorsiflexion WNL WNL  Ankle plantarflexion      Ankle inversion      Ankle eversion       (Blank rows = not tested) Lumbar AROM standing: flexion limited 25%. Extension WNL All hip PROM limited all planes due to pain; pt very pain dominant bil LEs   LOWER EXTREMITY MMT:   MMT Right eval Left eval Right 12/03/2021 Left 12/03/2021  Hip flexion 4+/5 4/5 4+/5 4+/5  Hip extension        Hip abduction 4/5 4/5 5/5 4+/5  Hip adduction 4/5 4/5 5/5 5/5  Hip internal rotation        Hip external rotation        Knee flexion 4+/5 4/5 5/5 4/5 Lt hip pain  Knee extension 4+/5 4/5 5/5 5/5  Ankle dorsiflexion 4+/5 4/5 5/5  5/5  Ankle plantarflexion        Ankle inversion        Ankle eversion         (Blank rows = not tested)   LOWER EXTREMITY SPECIAL TESTS:  R fabers +, R crossover ITB painful, R SLR + for hamstring tightness; L fabers + for pain and limitation of motion, L SLR + for hamstring tightness, L crossover ITB painful. Thomas/Obers +bil   GAIT: Distance walked: in gym Assistive device utilized: None Level of assistance: Complete Independence Comments: minimal heel strike, walks with both knees slightly flexed, no trunk rotation, minimal arm swing on left, walks carefully        [facility] TREATMENT[facility]  [facility]  DATE: 12/13/21 Therapeutic Exercise: NuStep LEs only level 5 x 5 min with PT discussion of pt's pain standing hinge over counter for static L glute stretch 2x30 sec Dynamic hinge over the counter for L glute stretch weightshift x 20 At counter, L hip flex 2 lbs x 20, L hip abdct 2 lbs x 20, 2 lb combo hip flex/slight abdct combo x 14 standing hinge over counter for static L glute stretch x30 sec Supine reverse crunch with ball under legs x 20 Supine trunk rotation with ball x 20 L SLR x 15 with 2 pauses R bridge/L hip flex combo 2x10 Supine figure 4 2x30 sec Thomas stretch L 2x30 sec   [facility] Adult PT Treatment:                                                DATE: 12/06/2021 Therapeutic Exercise: Seated Lt piriformis stretch x2 minutes Prone hamstring curls with slow, eccentric lowering with 4# ankle weights 3x10 Quadruped donkey kicks with slow lowering with 4# ankle weights 2x10 BIL Supine reverse crunches with 4# ankle weights 3x10 Standing Pallof press with 10# cable 2x10 with 5-sec hold BIL Standing windmill stretch 2x10 BIL Standing abdominal press-down with two 10# cables 3x10 Manual Therapy: N/A Neuromuscular re-ed: N/A Therapeutic Activity: N/A Modalities: N/A Self Care: N/A   [facility] Adult PT Treatment:                                                 DATE: 12/03/2021 Therapeutic Exercise: Supine with lower legs off end of table, BIL hip flexion with slow return 3x8 Supine 90/90 abdominal isometric with handhold resistance 3x30 seconds Prone alternating hip extension 3x20 Standing hip extension with 7# cable to ankle attachment at Free Motion machine 2x10 BIL Standing hamstring curl with 7# cable to ankle attachment at Free Motion machine 2x10 BIL Standing hip abduction with 7# cable to ankle attachment at Free Motion machine 2x10 BIL Standing Pallof press with 10# cable x10 with 5-sec hold BIL Standing trunk rotation x10 BIL Manual Therapy: N/A Neuromuscular re-ed: N/A Therapeutic Activity: Re-assessment of objective measures with pt education Re-administration of FOTO with pt education Modalities: N/A Self Care: N/A   [facility] Adult PT Treatment:                                                DATE: 11/27/2021 Therapeutic Exercise: Seated piriformis stretch x72min BIL Supine 90/90 abdominal isometric with handhold resistance 4x30sec Standing hip extension with 7# cable to ankle attachment at Free Motion machine 2x10 BIL Standing hamstring curl with 7# cable to ankle attachment at Free Motion machine 2x10 BIL Standing hip abduction with 3# cable to ankle attachment at Free Motion machine 2x10 BIL Romanian deadlift with 45# bar 3x6 Standing IT band stretch with overhead reach and UE support x32min BIL Manual Therapy: N/A Neuromuscular re-ed: N/A Therapeutic Activity: N/A Modalities: N/A Self Care: N/A       PATIENT EDUCATION:  Education details: HEP issued Person educated: Patient Education method: Explanation Education comprehension: verbalized understanding     HOME EXERCISE PROGRAM:  Access Code: [facility] URL: [email or web address] Date: 11/19/2021 Prepared by: Karie Mainland  Exercises - Forward T with Counter Support  - 1 x daily - 7 x weekly - 2 sets  - 10 reps - 5 hold - Kneeling Plank on Forearms with Scapular Protraction Retraction AROM  - 1 x daily - 7 x weekly - 1 sets - 5 reps - 10-30 sec hold - Prone Hip Extension with Pillow Under Abdomen  - 2 x daily - 7 x weekly - 2 sets - 10 reps - 5 hold - Quadruped Rocking Slow  - 2 x daily - 7 x weekly - 1 sets - 10 reps - 15 hold - Supine Bridge  - 2 x daily - 7 x weekly - 2 sets - 10 reps - 5 hold - Supine Hamstring Stretch with Strap  - 2 x daily - 7 x weekly - 1 sets - 5 reps - 30 hold - Supine Lower Trunk Rotation  - 2 x daily - 7 x weekly - 2 sets - 10 reps - 10 hold - Supine Piriformis Stretch with Foot on Ground  - 2 x daily - 7 x weekly - 1 sets - 5 reps - 30 hold - Supine Single Knee to Chest Stretch  - 2 x daily - 7 x weekly - 1 sets - 5 reps - 30 hold   ASSESSMENT:   CLINICAL IMPRESSION: Pt continues to report L glute discomfort at ischial tuberosity functionally throughout the day. She would continue to benefit from skilled PT to address her primary impairments and return to her prior level of function with less limitation.     OBJECTIVE IMPAIRMENTS Abnormal gait, decreased activity tolerance, decreased coordination, decreased mobility, difficulty walking, decreased ROM, decreased strength, hypomobility, increased muscle spasms, impaired flexibility, postural dysfunction, and pain.    ACTIVITY LIMITATIONS carrying, bending, sitting, standing, squatting, sleeping, stairs, transfers, bed mobility, hygiene/grooming, locomotion level, and caring for others   PARTICIPATION LIMITATIONS: meal prep, cleaning, laundry, driving, shopping, community activity, and occupation   PERSONAL FACTORS 1 comorbidity: OA  are also affecting patient's functional outcome.         GOALS: Goals reviewed with patient? Yes   SHORT TERM GOALS: Target date: 11/22/2021    Pt will be independent with initial HEP Baseline:not issued at eval due to time constraints 11/27/2021: Pt reports daily adherence  to her HEP Goal status: ACHIEVED   2.  Pt will report ability to sit and watch TV with 25% less difficulty Baseline: unable > 10 min before change in position is needed Goal status: ONGOING     LONG TERM GOALS: Target date:  01/17/22     Pt will be able to go up the stairs without feeling like the left leg will give out >/=75% of the time Baseline: unable at eval 12/13/21 giving out 45% of the time Goal status: ONGOING   2.  Pt will be able to walk with </=4/10 bil LE pain >/=15 min Baseline: 9/10 at eval 12/13/21: 7/10 12/03/2021: Pt reports being able to walk 10 minutes before needing a seated rest break due to pain Goal status: PROGRESSING   3.  Pt will be able to stand with </= 4/10 bil LE pain x 20 min without the need to sit down to wash dishes Baseline: 8/10 bil LE pain 12/03/2021 and 12/13/21: Pt reports being able to stand 7-10 minutes before needing to sit due to pain Goal status: PROGRESSING   4.  Pt will be able  to sit >45 min with 75% less difficulty (esp at L ischial tuberosity) Baseline: 10 min; L glute more painful with sitting but both hurt 12/03/2021: 15 minutes 12/13/21: 30 min Goal status: PROGRESSING   5.  Pt will increase bil LE strength >/=1/2 MMT grade to assist with improved functional mobility Baseline:  MMT Right eval Left eval  Hip flexion 4+/5 4/5  Hip extension      Hip abduction 4/5 4/5  Hip adduction 4/5 4/5  Hip internal rotation      Hip external rotation      Knee flexion 4+/5 4/5  Knee extension 4+/5 4/5  Ankle dorsiflexion 4+/5 4/5  Ankle plantarflexion      Ankle inversion      Ankle eversion        12/03/2021 and 12/13/21: See updated MMT chart Goal status: PROGRESSING    6.  Pt will report overall bil LE pain </=4/10 with work duties (mopping, sweeping, cleaning) Baseline: up to 8/10 12/13/21: 6/10 pain 12/03/2021: 5/10 pain Goal status: PROGRESSING       PLAN: PT FREQUENCY: 2x/week   PT DURATION: 5 weeks   PLANNED INTERVENTIONS:  Therapeutic exercises, Therapeutic activity, Neuromuscular re-education, Gait training, Patient/Family education, Joint mobilization, Stair training, Dry Needling, Cryotherapy, Moist heat, Taping, Ultrasound, Ionotophoresis 4mg /ml Dexamethasone, Manual therapy, and Re-evaluation. [place]TO MAY NOT BE COVERED BY [employer]-IF NOT, ONLY USE IF DISCUSS WITH PATIENT   PLAN FOR NEXT SESSION: continue to work on bil LE flexibility with emphasis on L, Piriformis stretching, continue core strengthening         [facility] [address] [place], [place], [place] Phone: [phone]   Fax:  [phone]   [phone], PT 12/13/2021, 12:07 PM  [facility] [address] [place], [place], [place] Phone: [phone]   Fax:  [phone]

## 2021-12-24 ENCOUNTER — Ambulatory Visit: Payer: Medicare HMO

## 2021-12-24 DIAGNOSIS — M79605 Pain in left leg: Secondary | ICD-10-CM

## 2021-12-24 DIAGNOSIS — M5459 Other low back pain: Secondary | ICD-10-CM

## 2021-12-24 DIAGNOSIS — M79604 Pain in right leg: Secondary | ICD-10-CM | POA: Diagnosis not present

## 2021-12-24 DIAGNOSIS — R2689 Other abnormalities of gait and mobility: Secondary | ICD-10-CM | POA: Diagnosis not present

## 2021-12-24 NOTE — Therapy (Signed)
OUTPATIENT PHYSICAL THERAPY TREATMENT NOTE   Patient Details: Patient Name: Candice Warner MRN: [id number] DOB:[birth year], 71 y.o., female Today's Date: 12/24/2021  PCP: Lavonda Jumbo, DO REFERRING PROVIDER: Denny Levy, MD  PT End of Session - 12/24/21 1829     Visit Number 9    Number of Visits 10    Date for PT Re-Evaluation 01/17/22    Progress Note Due on Visit 10    PT Start Time 1830    PT Stop Time 1909    PT Time Calculation (min) 39 min    Activity Tolerance Patient tolerated treatment well;No increased pain    Behavior During Therapy [facility] for tasks assessed/performed                    Past Medical History:  Diagnosis Date   Arthritis    "right hip" (06/27/2018)   Depression    Hyperlipidemia    Hypertension    Hypokalemia    Hypothyroidism    Lumbar back pain    Obesity    Radiculopathy    Thyroid disease    Type II diabetes mellitus (HCC)    Past Surgical History:  Procedure Laterality Date   CYST REMOVAL HAND Right 11/07/2015   Procedure: EXCISION OF VOLAR RETANICULUM RIGHT HAND;  Surgeon: Loreta Ave, MD;  Location: [facility];  Service: Orthopedics;  Laterality: Right;   SHOULDER ARTHROSCOPY Left 2002   SHOULDER ARTHROSCOPY WITH ROTATOR CUFF REPAIR AND SUBACROMIAL DECOMPRESSION  07/14/2012   Procedure: SHOULDER ARTHROSCOPY WITH ROTATOR CUFF REPAIR AND SUBACROMIAL DECOMPRESSION;  Surgeon: Loreta Ave, MD;  Location: [facility];  Service: Orthopedics;  Laterality: Right;  Right Shoulder Arthroscopy, Distal Claviculectomy, Subacromial Decompression, Partial Acromioplasty with Coracoacromial Release with Arthroscopic Rotator Cuff Repair    THYROID LOBECTOMY Right    Goiter s/p right thyroidectomy   TRIGGER FINGER RELEASE Right 11/07/2015   Procedure: RELEASE TRIGGER FINGER/A-1 PULLEY RIGHT RING FINGER ;  Surgeon: Loreta Ave, MD;  Location: [facility];  Service: Orthopedics;  Laterality:  Right;   TUBAL LIGATION     Patient Active Problem List   Diagnosis Date Noted   Calcification of left breast 06/26/2019   Osteoarthritis 03/15/2019   Nodule of chest wall 06/21/2015   Trigger finger, acquired 10/23/2014   Right hip pain 06/01/2011   HLD (hyperlipidemia) 04/16/2009   Type 2 diabetes mellitus without complication, without long-term current use of insulin (HCC) 10/29/2008   DEGENERATIVE JOINT DISEASE, HIPS 10/29/2008   Hypothyroidism 08/12/2006   OBESITY, NOS 08/12/2006   Major depressive disorder, recurrent episode (HCC) 08/12/2006   HYPERTENSION, BENIGN SYSTEMIC 08/12/2006   DIVERTICULOSIS OF COLON 08/12/2006    REFERRING DIAG: Hamstring tightness bil LEs  THERAPY DIAG:  Pain in left leg  Other abnormalities of gait and mobility  Other low back pain  Rationale for Evaluation and Treatment Rehabilitation  PERTINENT HISTORY: Per chart review 09/10/21: Continued leg pain for 2 months. Worsening with recent hospitalization for pyelonephritis but has improved significantly since being discharge. Worse with climbing stairs and getting out of the car. She has been trying stretching exercises at home. She denies tingling, numbness. Denies hip and knee pain. Pain is located at the back of thighs bilaterally  PRECAUTIONS: None  SUBJECTIVE: Pt presents to PT with reports of continued bilateral posterior hip pain and discomfort. Pt has been compliant with HEP with no adverse effect. Pt is ready to begin PT at this time.  Pain: Are you having pain? Yes NPRS: 7/10 Pain Location: bilateral posterior hips Pain Frequency/Description: intermittent  Aggravating Factors: stairs Relieving Factors: rest  OBJECTIVE:    LOWER EXTREMITY ROM:   Passive ROM Right eval Left eval  Hip flexion Limited to 90  degrees Limited to 90 degrees  Hip extension      Hip abduction limited limited  Hip adduction limited limited  Hip internal rotation limited limited  Hip external  rotation Limited  limited  Knee flexion WNL WNL  Knee extension WNL WNL  Ankle dorsiflexion WNL WNL  Ankle plantarflexion      Ankle inversion      Ankle eversion       (Blank rows = not tested) Lumbar AROM standing: flexion limited 25%. Extension WNL All hip PROM limited all planes due to pain; pt very pain dominant bil LEs   LOWER EXTREMITY MMT:   MMT Right eval Left eval Right 12/03/2021 Left 12/03/2021  Hip flexion 4+/5 4/5 4+/5 4+/5  Hip extension        Hip abduction 4/5 4/5 5/5 4+/5  Hip adduction 4/5 4/5 5/5 5/5  Hip internal rotation        Hip external rotation        Knee flexion 4+/5 4/5 5/5 4/5 Lt hip pain  Knee extension 4+/5 4/5 5/5 5/5  Ankle dorsiflexion 4+/5 4/5 5/5 5/5  Ankle plantarflexion        Ankle inversion        Ankle eversion         (Blank rows = not tested)    TODAY'S TREATMENT: [facility] Adult PT Treatment:                                                DATE: 12/24/2021 Therapeutic Exercise: NuStep LEs only level 5 x 5 min while taking subjective Seated hamstring stretch 2x30"  Supine hamstring stretch with strap 2x30" each Bridge 2x10 Fwd physioball rollout x 10 - 5" hold Slant board stretch 2x30"  Standing hip abd/ext 2x10 GTB Standing mini squat 2x10  Step ups fwd 10in 2x10 ea  [facility] Adult PT Treatment:                                                DATE: 12/13/2021 Therapeutic Exercise: NuStep LEs only level 5 x 5 min with PT discussion of pt's pain standing hinge over counter for static L glute stretch 2x30 sec Dynamic hinge over the counter for L glute stretch weightshift x 20 At counter, L hip flex 2 lbs x 20, L hip abdct 2 lbs x 20, 2 lb combo hip flex/slight abdct combo x 14 standing hinge over counter for static L glute stretch x30 sec Supine reverse crunch with ball under legs x 20 Supine trunk rotation with ball x 20 L SLR x 15 with 2 pauses R bridge/L hip flex combo 2x10 Supine figure 4 2x30 sec Thomas stretch L 2x30  sec   [facility] Adult PT Treatment:  DATE: 12/06/2021 Therapeutic Exercise: Seated Lt piriformis stretch x2 minutes Prone hamstring curls with slow, eccentric lowering with 4# ankle weights 3x10 Quadruped donkey kicks with slow lowering with 4# ankle weights 2x10 BIL Supine reverse crunches with 4# ankle weights 3x10 Standing Pallof press with 10# cable 2x10 with 5-sec hold BIL Standing windmill stretch 2x10 BIL Standing abdominal press-down with two 10# cables 3x10   PATIENT EDUCATION:  Education details: HEP issued Person educated: Patient Education method: Explanation Education comprehension: verbalized understanding     HOME EXERCISE PROGRAM:  Access Code: [facility] URL: [email or web address] Date: 11/19/2021 Prepared by: Karie Mainland  Exercises - Forward T with Counter Support  - 1 x daily - 7 x weekly - 2 sets - 10 reps - 5 hold - Kneeling Plank on Forearms with Scapular Protraction Retraction AROM  - 1 x daily - 7 x weekly - 1 sets - 5 reps - 10-30 sec hold - Prone Hip Extension with Pillow Under Abdomen  - 2 x daily - 7 x weekly - 2 sets - 10 reps - 5 hold - Quadruped Rocking Slow  - 2 x daily - 7 x weekly - 1 sets - 10 reps - 15 hold - Supine Bridge  - 2 x daily - 7 x weekly - 2 sets - 10 reps - 5 hold - Supine Hamstring Stretch with Strap  - 2 x daily - 7 x weekly - 1 sets - 5 reps - 30 hold - Supine Lower Trunk Rotation  - 2 x daily - 7 x weekly - 2 sets - 10 reps - 10 hold - Supine Piriformis Stretch with Foot on Ground  - 2 x daily - 7 x weekly - 1 sets - 5 reps - 30 hold - Supine Single Knee to Chest Stretch  - 2 x daily - 7 x weekly - 1 sets - 5 reps - 30 hold   ASSESSMENT:   CLINICAL IMPRESSION: Pt was able to complete all prescribed exercises with no adverse effect or increase in pain. Therapy focused on improving proximal hip strength and posterior chain stretching. She is progressing well with therapy and  will continue to be seen and progressed as able per POC.      OBJECTIVE IMPAIRMENTS Abnormal gait, decreased activity tolerance, decreased coordination, decreased mobility, difficulty walking, decreased ROM, decreased strength, hypomobility, increased muscle spasms, impaired flexibility, postural dysfunction, and pain.    ACTIVITY LIMITATIONS carrying, bending, sitting, standing, squatting, sleeping, stairs, transfers, bed mobility, hygiene/grooming, locomotion level, and caring for others   PARTICIPATION LIMITATIONS: meal prep, cleaning, laundry, driving, shopping, community activity, and occupation   PERSONAL FACTORS 1 comorbidity: OA  are also affecting patient's functional outcome.         GOALS: Goals reviewed with patient? Yes   SHORT TERM GOALS: Target date: 11/22/2021    Pt will be independent with initial HEP Baseline:not issued at eval due to time constraints 11/27/2021: Pt reports daily adherence to her HEP Goal status: ACHIEVED   2.  Pt will report ability to sit and watch TV with 25% less difficulty Baseline: unable > 10 min before change in position is needed Goal status: ONGOING     LONG TERM GOALS: Target date:  01/17/22     Pt will be able to go up the stairs without feeling like the left leg will give out >/=75% of the time Baseline: unable at eval 12/13/21 giving out 45% of the time Goal status: ONGOING   2.  Pt will  be able to walk with </=4/10 bil LE pain >/=15 min Baseline: 9/10 at eval 12/13/21: 7/10 12/03/2021: Pt reports being able to walk 10 minutes before needing a seated rest break due to pain Goal status: PROGRESSING   3.  Pt will be able to stand with </= 4/10 bil LE pain x 20 min without the need to sit down to wash dishes Baseline: 8/10 bil LE pain 12/03/2021 and 12/13/21: Pt reports being able to stand 7-10 minutes before needing to sit due to pain Goal status: PROGRESSING   4.  Pt will be able to sit >45 min with 75% less difficulty (esp at L  ischial tuberosity) Baseline: 10 min; L glute more painful with sitting but both hurt 12/03/2021: 15 minutes 12/13/21: 30 min Goal status: PROGRESSING   5.  Pt will increase bil LE strength >/=1/2 MMT grade to assist with improved functional mobility Baseline:  MMT Right eval Left eval  Hip flexion 4+/5 4/5  Hip extension      Hip abduction 4/5 4/5  Hip adduction 4/5 4/5  Hip internal rotation      Hip external rotation      Knee flexion 4+/5 4/5  Knee extension 4+/5 4/5  Ankle dorsiflexion 4+/5 4/5  Ankle plantarflexion      Ankle inversion      Ankle eversion        12/03/2021 and 12/13/21: See updated MMT chart Goal status: PROGRESSING    6.  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 IONTO MAY NOT BE COVERED BY [employer]-IF NOT, ONLY USE IF DISCUSS WITH PATIENT   PLAN FOR NEXT SESSION: continue to work on bil LE flexibility with emphasis on L, Piriformis stretching, continue core strengthening    , PT 12/24/2021, 7:09 PM

## 2021-12-25 ENCOUNTER — Ambulatory Visit: Payer: Medicare HMO

## 2021-12-25 DIAGNOSIS — R2689 Other abnormalities of gait and mobility: Secondary | ICD-10-CM

## 2021-12-25 DIAGNOSIS — M79605 Pain in left leg: Secondary | ICD-10-CM

## 2021-12-25 DIAGNOSIS — M5459 Other low back pain: Secondary | ICD-10-CM

## 2021-12-25 DIAGNOSIS — M79604 Pain in right leg: Secondary | ICD-10-CM | POA: Diagnosis not present

## 2021-12-25 NOTE — Therapy (Signed)
OUTPATIENT PHYSICAL THERAPY TREATMENT NOTE   Patient Details: Patient Name: Candice Warner MRN: [id number] DOB:[birth year], 71 y.o., female Today's Date: 12/25/2021  PCP: Lavonda Jumbo, DO REFERRING PROVIDER: Denny Levy, MD  PT End of Session - 12/25/21 1655     Visit Number 10    Number of Visits 18    Date for PT Re-Evaluation 01/17/22    Progress Note Due on Visit 10    PT Start Time 1700    PT Stop Time 1743    PT Time Calculation (min) 43 min    Activity Tolerance Patient tolerated treatment well;No increased pain    Behavior During Therapy [facility] for tasks assessed/performed                     Past Medical History:  Diagnosis Date   Arthritis    "right hip" (06/27/2018)   Depression    Hyperlipidemia    Hypertension    Hypokalemia    Hypothyroidism    Lumbar back pain    Obesity    Radiculopathy    Thyroid disease    Type II diabetes mellitus (HCC)    Past Surgical History:  Procedure Laterality Date   CYST REMOVAL HAND Right 11/07/2015   Procedure: EXCISION OF VOLAR RETANICULUM RIGHT HAND;  Surgeon: Loreta Ave, MD;  Location: [facility];  Service: Orthopedics;  Laterality: Right;   SHOULDER ARTHROSCOPY Left 2002   SHOULDER ARTHROSCOPY WITH ROTATOR CUFF REPAIR AND SUBACROMIAL DECOMPRESSION  07/14/2012   Procedure: SHOULDER ARTHROSCOPY WITH ROTATOR CUFF REPAIR AND SUBACROMIAL DECOMPRESSION;  Surgeon: Loreta Ave, MD;  Location: [facility];  Service: Orthopedics;  Laterality: Right;  Right Shoulder Arthroscopy, Distal Claviculectomy, Subacromial Decompression, Partial Acromioplasty with Coracoacromial Release with Arthroscopic Rotator Cuff Repair    THYROID LOBECTOMY Right    Goiter s/p right thyroidectomy   TRIGGER FINGER RELEASE Right 11/07/2015   Procedure: RELEASE TRIGGER FINGER/A-1 PULLEY RIGHT RING FINGER ;  Surgeon: Loreta Ave, MD;  Location: [facility];  Service: Orthopedics;  Laterality:  Right;   TUBAL LIGATION     Patient Active Problem List   Diagnosis Date Noted   Calcification of left breast 06/26/2019   Osteoarthritis 03/15/2019   Nodule of chest wall 06/21/2015   Trigger finger, acquired 10/23/2014   Right hip pain 06/01/2011   HLD (hyperlipidemia) 04/16/2009   Type 2 diabetes mellitus without complication, without long-term current use of insulin (HCC) 10/29/2008   DEGENERATIVE JOINT DISEASE, HIPS 10/29/2008   Hypothyroidism 08/12/2006   OBESITY, NOS 08/12/2006   Major depressive disorder, recurrent episode (HCC) 08/12/2006   HYPERTENSION, BENIGN SYSTEMIC 08/12/2006   DIVERTICULOSIS OF COLON 08/12/2006    REFERRING DIAG: Hamstring tightness bil LEs  THERAPY DIAG:  Pain in left leg  Other abnormalities of gait and mobility  Other low back pain  Rationale for Evaluation and Treatment Rehabilitation  PERTINENT HISTORY: Per chart review 09/10/21: Continued leg pain for 2 months. Worsening with recent hospitalization for pyelonephritis but has improved significantly since being discharge. Worse with climbing stairs and getting out of the car. She has been trying stretching exercises at home. She denies tingling, numbness. Denies hip and knee pain. Pain is located at the back of thighs bilaterally  PRECAUTIONS: None  SUBJECTIVE: Pt presents to PT with reports of bilateral hamstring muscle tightness and hip muscle soreness. Has been compliant with HEP with no adverse effect. Pt is ready to begin PT at this time.  Pain: Are you having pain? Yes NPRS: 6/10 Pain Location: bilateral posterior hips Pain Frequency/Description: intermittent  Aggravating Factors: stairs Relieving Factors: rest  OBJECTIVE:    LOWER EXTREMITY ROM:   Passive ROM Right eval Left eval  Hip flexion Limited to 90  degrees Limited to 90 degrees  Hip extension      Hip abduction limited limited  Hip adduction limited limited  Hip internal rotation limited limited  Hip  external rotation Limited  limited  Knee flexion WNL WNL  Knee extension WNL WNL  Ankle dorsiflexion WNL WNL  Ankle plantarflexion      Ankle inversion      Ankle eversion       (Blank rows = not tested) Lumbar AROM standing: flexion limited 25%. Extension WNL All hip PROM limited all planes due to pain; pt very pain dominant bil LEs   LOWER EXTREMITY MMT:   MMT Right eval Left eval Right 12/03/2021 Left 12/03/2021  Hip flexion 4+/5 4/5 4+/5 4+/5  Hip extension        Hip abduction 4/5 4/5 5/5 4+/5  Hip adduction 4/5 4/5 5/5 5/5  Hip internal rotation        Hip external rotation        Knee flexion 4+/5 4/5 5/5 4/5 Lt hip pain  Knee extension 4+/5 4/5 5/5 5/5  Ankle dorsiflexion 4+/5 4/5 5/5 5/5  Ankle plantarflexion        Ankle inversion        Ankle eversion         (Blank rows = not tested)    TODAY'S TREATMENT: [facility] Adult PT Treatment:                                                DATE: 12/25/2021 Therapeutic Exercise: NuStep LEs only level 5 x 5 min while taking subjective Supine hamstring stretch with strap 2x30" each Supine SLR 2x10  Contract/relax of hamstrings  Slant board stretch 2x45"  Standing hip abd/ext 2x10 25# Standing mini squat 2x10  Standing hamstring stretch 2x30" each Step ups fwd 10in 2x10 ea  [facility] Adult PT Treatment:                                                DATE: 12/24/2021 Therapeutic Exercise: NuStep LEs only level 5 x 5 min while taking subjective Seated hamstring stretch 2x30"  Supine hamstring stretch with strap 2x30" each Bridge 2x10 Fwd physioball rollout x 10 - 5" hold Slant board stretch 2x30"  Standing hip abd/ext 2x10 GTB Standing mini squat 2x10  Step ups fwd 10in 2x10 ea  [facility] Adult PT Treatment:                                                DATE: 12/13/2021 Therapeutic Exercise: NuStep LEs only level 5 x 5 min with PT discussion of pt's pain standing hinge over counter for static L glute stretch 2x30 sec Dynamic  hinge over the counter for L glute stretch weightshift x 20 At counter, L hip flex 2 lbs x 20, L hip abdct  2 lbs x 20, 2 lb combo hip flex/slight abdct combo x 14 standing hinge over counter for static L glute stretch x30 sec Supine reverse crunch with ball under legs x 20 Supine trunk rotation with ball x 20 L SLR x 15 with 2 pauses R bridge/L hip flex combo 2x10 Supine figure 4 2x30 sec Thomas stretch L 2x30 sec  PATIENT EDUCATION:  Education details: HEP issued Person educated: Patient Education method: Explanation Education comprehension: verbalized understanding     HOME EXERCISE PROGRAM:  Access Code: [facility] URL: [email or web address] Date: 11/19/2021 Prepared by: Karie Mainland  Exercises - Forward T with Counter Support  - 1 x daily - 7 x weekly - 2 sets - 10 reps - 5 hold - Kneeling Plank on Forearms with Scapular Protraction Retraction AROM  - 1 x daily - 7 x weekly - 1 sets - 5 reps - 10-30 sec hold - Prone Hip Extension with Pillow Under Abdomen  - 2 x daily - 7 x weekly - 2 sets - 10 reps - 5 hold - Quadruped Rocking Slow  - 2 x daily - 7 x weekly - 1 sets - 10 reps - 15 hold - Supine Bridge  - 2 x daily - 7 x weekly - 2 sets - 10 reps - 5 hold - Supine Hamstring Stretch with Strap  - 2 x daily - 7 x weekly - 1 sets - 5 reps - 30 hold - Supine Lower Trunk Rotation  - 2 x daily - 7 x weekly - 2 sets - 10 reps - 10 hold - Supine Piriformis Stretch with Foot on Ground  - 2 x daily - 7 x weekly - 1 sets - 5 reps - 30 hold - Supine Single Knee to Chest Stretch  - 2 x daily - 7 x weekly - 1 sets - 5 reps - 30 hold   ASSESSMENT:   CLINICAL IMPRESSION: Pt was able to complete all prescribed exercises with no adverse effect. Therapy focused on proximal hip strenghtening and improving hamstring muscle length. She continues to progress well with therapy and will continue to be seen per POC as prescribed.      OBJECTIVE IMPAIRMENTS Abnormal gait, decreased  activity tolerance, decreased coordination, decreased mobility, difficulty walking, decreased ROM, decreased strength, hypomobility, increased muscle spasms, impaired flexibility, postural dysfunction, and pain.    ACTIVITY LIMITATIONS carrying, bending, sitting, standing, squatting, sleeping, stairs, transfers, bed mobility, hygiene/grooming, locomotion level, and caring for others   PARTICIPATION LIMITATIONS: meal prep, cleaning, laundry, driving, shopping, community activity, and occupation   PERSONAL FACTORS 1 comorbidity: OA  are also affecting patient's functional outcome.         GOALS: Goals reviewed with patient? Yes   SHORT TERM GOALS: Target date: 11/22/2021    Pt will be independent with initial HEP Baseline:not issued at eval due to time constraints 11/27/2021: Pt reports daily adherence to her HEP Goal status: ACHIEVED   2.  Pt will report ability to sit and watch TV with 25% less difficulty Baseline: unable > 10 min before change in position is needed Goal status: ONGOING     LONG TERM GOALS: Target date:  01/17/22     Pt will be able to go up the stairs without feeling like the left leg will give out >/=75% of the time Baseline: unable at eval 12/13/21 giving out 45% of the time Goal status: ONGOING   2.  Pt will be able to walk with </=4/10 bil LE pain >/=  15 min Baseline: 9/10 at eval 12/13/21: 7/10 12/03/2021: Pt reports being able to walk 10 minutes before needing a seated rest break due to pain Goal status: PROGRESSING   3.  Pt will be able to stand with </= 4/10 bil LE pain x 20 min without the need to sit down to wash dishes Baseline: 8/10 bil LE pain 12/03/2021 and 12/13/21: Pt reports being able to stand 7-10 minutes before needing to sit due to pain Goal status: PROGRESSING   4.  Pt will be able to sit >45 min with 75% less difficulty (esp at L ischial tuberosity) Baseline: 10 min; L glute more painful with sitting but both hurt 12/03/2021: 15  minutes 12/13/21: 30 min Goal status: PROGRESSING   5.  Pt will increase bil LE strength >/=1/2 MMT grade to assist with improved functional mobility Baseline:  MMT Right eval Left eval  Hip flexion 4+/5 4/5  Hip extension      Hip abduction 4/5 4/5  Hip adduction 4/5 4/5  Hip internal rotation      Hip external rotation      Knee flexion 4+/5 4/5  Knee extension 4+/5 4/5  Ankle dorsiflexion 4+/5 4/5  Ankle plantarflexion      Ankle inversion      Ankle eversion        12/03/2021 and 12/13/21: See updated MMT chart Goal status: PROGRESSING    6.  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 IONTO MAY NOT BE COVERED BY [employer]-IF NOT, ONLY USE IF DISCUSS WITH PATIENT   PLAN FOR NEXT SESSION: continue to work on bil LE flexibility with emphasis on L, Piriformis stretching, continue core strengthening    , PT 12/25/2021, 5:44 PM

## 2021-12-28 ENCOUNTER — Other Ambulatory Visit: Payer: Self-pay | Admitting: Family Medicine

## 2021-12-28 DIAGNOSIS — H109 Unspecified conjunctivitis: Secondary | ICD-10-CM

## 2021-12-31 ENCOUNTER — Ambulatory Visit: Payer: Medicare HMO

## 2021-12-31 DIAGNOSIS — M5459 Other low back pain: Secondary | ICD-10-CM

## 2021-12-31 DIAGNOSIS — M79604 Pain in right leg: Secondary | ICD-10-CM | POA: Diagnosis not present

## 2021-12-31 DIAGNOSIS — M79605 Pain in left leg: Secondary | ICD-10-CM | POA: Diagnosis not present

## 2021-12-31 DIAGNOSIS — R2689 Other abnormalities of gait and mobility: Secondary | ICD-10-CM

## 2021-12-31 NOTE — Therapy (Signed)
OUTPATIENT PHYSICAL THERAPY TREATMENT NOTE   Patient Details: Patient Name: Candice Warner MRN: [id number] DOB:11-10-1950, 71 y.o., female Today's Date: 12/31/2021  PCP: Lavonda Jumbo, DO REFERRING PROVIDER: Denny Levy, MD  PT End of Session - 12/31/21 1829     Visit Number 11    Number of Visits 18    Date for PT Re-Evaluation 01/17/22    Authorization Type [employer] MCR    Authorization Time Period FOTO v6, v10    Progress Note Due on Visit 10    PT Start Time 1830    PT Stop Time 1908    PT Time Calculation (min) 38 min    Activity Tolerance Patient tolerated treatment well;No increased pain    Behavior During Therapy [facility] for tasks assessed/performed                      Past Medical History:  Diagnosis Date   Arthritis    "right hip" (06/27/2018)   Depression    Hyperlipidemia    Hypertension    Hypokalemia    Hypothyroidism    Lumbar back pain    Obesity    Radiculopathy    Thyroid disease    Type II diabetes mellitus (HCC)    Past Surgical History:  Procedure Laterality Date   CYST REMOVAL HAND Right 11/07/2015   Procedure: EXCISION OF VOLAR RETANICULUM RIGHT HAND;  Surgeon: Loreta Ave, MD;  Location: [facility];  Service: Orthopedics;  Laterality: Right;   SHOULDER ARTHROSCOPY Left 2002   SHOULDER ARTHROSCOPY WITH ROTATOR CUFF REPAIR AND SUBACROMIAL DECOMPRESSION  07/14/2012   Procedure: SHOULDER ARTHROSCOPY WITH ROTATOR CUFF REPAIR AND SUBACROMIAL DECOMPRESSION;  Surgeon: Loreta Ave, MD;  Location: [facility];  Service: Orthopedics;  Laterality: Right;  Right Shoulder Arthroscopy, Distal Claviculectomy, Subacromial Decompression, Partial Acromioplasty with Coracoacromial Release with Arthroscopic Rotator Cuff Repair    THYROID LOBECTOMY Right    Goiter s/p right thyroidectomy   TRIGGER FINGER RELEASE Right 11/07/2015   Procedure: RELEASE TRIGGER FINGER/A-1 PULLEY RIGHT RING FINGER ;  Surgeon: Loreta Ave,  MD;  Location: [facility];  Service: Orthopedics;  Laterality: Right;   TUBAL LIGATION     Patient Active Problem List   Diagnosis Date Noted   Calcification of left breast 06/26/2019   Osteoarthritis 03/15/2019   Nodule of chest wall 06/21/2015   Trigger finger, acquired 10/23/2014   Right hip pain 06/01/2011   HLD (hyperlipidemia) 04/16/2009   Type 2 diabetes mellitus without complication, without long-term current use of insulin (HCC) 10/29/2008   DEGENERATIVE JOINT DISEASE, HIPS 10/29/2008   Hypothyroidism 08/12/2006   OBESITY, NOS 08/12/2006   Major depressive disorder, recurrent episode (HCC) 08/12/2006   HYPERTENSION, BENIGN SYSTEMIC 08/12/2006   DIVERTICULOSIS OF COLON 08/12/2006    REFERRING DIAG: Hamstring tightness bil LEs  THERAPY DIAG:  Pain in left leg  Other abnormalities of gait and mobility  Other low back pain  Pain in right leg  Rationale for Evaluation and Treatment Rehabilitation  PERTINENT HISTORY: Per chart review 09/10/21: Continued leg pain for 2 months. Worsening with recent hospitalization for pyelonephritis but has improved significantly since being discharge. Worse with climbing stairs and getting out of the car. She has been trying stretching exercises at home. She denies tingling, numbness. Denies hip and knee pain. Pain is located at the back of thighs bilaterally  PRECAUTIONS: None  SUBJECTIVE: Pt reports increased BIL LE pain today after working yesterday. She reports  daily adherence to her HEP.  Pain: Are you having pain? Yes NPRS: 8/10 Pain Location: bilateral posterior hips and BIL knees Pain Frequency/Description: intermittent  Aggravating Factors: stairs Relieving Factors: rest  OBJECTIVE:    LOWER EXTREMITY ROM:   Passive ROM Right eval Left eval  Hip flexion Limited to 90  degrees Limited to 90 degrees  Hip extension      Hip abduction limited limited  Hip adduction limited limited  Hip internal  rotation limited limited  Hip external rotation Limited  limited  Knee flexion WNL WNL  Knee extension WNL WNL  Ankle dorsiflexion WNL WNL  Ankle plantarflexion      Ankle inversion      Ankle eversion       (Blank rows = not tested) Lumbar AROM standing: flexion limited 25%. Extension WNL All hip PROM limited all planes due to pain; pt very pain dominant bil LEs   LOWER EXTREMITY MMT:   MMT Right eval Left eval Right 12/03/2021 Left 12/03/2021  Hip flexion 4+/5 4/5 4+/5 4+/5  Hip extension        Hip abduction 4/5 4/5 5/5 4+/5  Hip adduction 4/5 4/5 5/5 5/5  Hip internal rotation        Hip external rotation        Knee flexion 4+/5 4/5 5/5 4/5 Lt hip pain  Knee extension 4+/5 4/5 5/5 5/5  Ankle dorsiflexion 4+/5 4/5 5/5 5/5  Ankle plantarflexion        Ankle inversion        Ankle eversion         (Blank rows = not tested)  12/31/2021: FOTO:  29%   TODAY'S TREATMENT:  [facility] Adult PT Treatment:                                                DATE: 12/31/2021 Therapeutic Exercise: Staggered bridge 2x8 BIL Thomas stretch x72min BIL Seated active hamstring stretch x21min BIL Prone hamstring curl with 3# ankle weights 3x10 with slow, eccentric lowering Prone knee hangs with 3# ankle weights 3x30sec Manual Therapy: N/A Neuromuscular re-ed: N/A Therapeutic Activity: Re-administration of FOTO with pt education Dead lift with 10# kettlebell in front of table 3x5 Modalities: N/A Self Care: N/A   [facility] Adult PT Treatment:                                                DATE: 12/25/2021 Therapeutic Exercise: NuStep LEs only level 5 x 5 min while taking subjective Supine hamstring stretch with strap 2x30" each Supine SLR 2x10  Contract/relax of hamstrings  Slant board stretch 2x45"  Standing hip abd/ext 2x10 25# Standing mini squat 2x10  Standing hamstring stretch 2x30" each Step ups fwd 10in 2x10 ea  [facility] Adult PT Treatment:                                                 DATE: 12/24/2021 Therapeutic Exercise: NuStep LEs only level 5 x 5 min while taking subjective Seated hamstring stretch 2x30"  Supine hamstring stretch with strap 2x30" each Bridge 2x10  Fwd physioball rollout x 10 - 5" hold Slant board stretch 2x30"  Standing hip abd/ext 2x10 GTB Standing mini squat 2x10  Step ups fwd 10in 2x10 ea   PATIENT EDUCATION:  Education details: HEP issued Person educated: Patient Education method: Explanation Education comprehension: verbalized understanding     HOME EXERCISE PROGRAM:  Access Code: [facility] URL: [email or web address] Date: 11/19/2021 Prepared by: Karie Mainland  Exercises - Forward T with Counter Support  - 1 x daily - 7 x weekly - 2 sets - 10 reps - 5 hold - Kneeling Plank on Forearms with Scapular Protraction Retraction AROM  - 1 x daily - 7 x weekly - 1 sets - 5 reps - 10-30 sec hold - Prone Hip Extension with Pillow Under Abdomen  - 2 x daily - 7 x weekly - 2 sets - 10 reps - 5 hold - Quadruped Rocking Slow  - 2 x daily - 7 x weekly - 1 sets - 10 reps - 15 hold - Supine Bridge  - 2 x daily - 7 x weekly - 2 sets - 10 reps - 5 hold - Supine Hamstring Stretch with Strap  - 2 x daily - 7 x weekly - 1 sets - 5 reps - 30 hold - Supine Lower Trunk Rotation  - 2 x daily - 7 x weekly - 2 sets - 10 reps - 10 hold - Supine Piriformis Stretch with Foot on Ground  - 2 x daily - 7 x weekly - 1 sets - 5 reps - 30 hold - Supine Single Knee to Chest Stretch  - 2 x daily - 7 x weekly - 1 sets - 5 reps - 30 hold   ASSESSMENT:   CLINICAL IMPRESSION: Pt responded well to progressed LE strengthening today, demonstrating good form and no increase in pain with selected exercises, although she continued to experience high baseline pain throughout the treatment. She will continue to benefit from skilled PT to address her primary impairments and return to her prior level of function with less limitation.     OBJECTIVE IMPAIRMENTS Abnormal  gait, decreased activity tolerance, decreased coordination, decreased mobility, difficulty walking, decreased ROM, decreased strength, hypomobility, increased muscle spasms, impaired flexibility, postural dysfunction, and pain.    ACTIVITY LIMITATIONS carrying, bending, sitting, standing, squatting, sleeping, stairs, transfers, bed mobility, hygiene/grooming, locomotion level, and caring for others   PARTICIPATION LIMITATIONS: meal prep, cleaning, laundry, driving, shopping, community activity, and occupation   PERSONAL FACTORS 1 comorbidity: OA  are also affecting patient's functional outcome.         GOALS: Goals reviewed with patient? Yes   SHORT TERM GOALS: Target date: 11/22/2021    Pt will be independent with initial HEP Baseline:not issued at eval due to time constraints 11/27/2021: Pt reports daily adherence to her HEP Goal status: ACHIEVED   2.  Pt will report ability to sit and watch TV with 25% less difficulty Baseline: unable > 10 min before change in position is needed Goal status: ONGOING     LONG TERM GOALS: Target date:  01/17/22     Pt will be able to go up the stairs without feeling like the left leg will give out >/=75% of the time Baseline: unable at eval 12/13/21 giving out 45% of the time Goal status: ONGOING   2.  Pt will be able to walk with </=4/10 bil LE pain >/=15 min Baseline: 9/10 at eval 12/13/21: 7/10 12/03/2021: Pt reports being able to walk 10 minutes before needing a  seated rest break due to pain Goal status: PROGRESSING   3.  Pt will be able to stand with </= 4/10 bil LE pain x 20 min without the need to sit down to wash dishes Baseline: 8/10 bil LE pain 12/03/2021 and 12/13/21: Pt reports being able to stand 7-10 minutes before needing to sit due to pain Goal status: PROGRESSING   4.  Pt will be able to sit >45 min with 75% less difficulty (esp at L ischial tuberosity) Baseline: 10 min; L glute more painful with sitting but both hurt 12/03/2021: 15  minutes 12/13/21: 30 min Goal status: PROGRESSING   5.  Pt will increase bil LE strength >/=1/2 MMT grade to assist with improved functional mobility Baseline:  MMT Right eval Left eval  Hip flexion 4+/5 4/5  Hip extension      Hip abduction 4/5 4/5  Hip adduction 4/5 4/5  Hip internal rotation      Hip external rotation      Knee flexion 4+/5 4/5  Knee extension 4+/5 4/5  Ankle dorsiflexion 4+/5 4/5  Ankle plantarflexion      Ankle inversion      Ankle eversion        12/03/2021 and 12/13/21: See updated MMT chart Goal status: PROGRESSING    6.  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 IONTO MAY NOT BE COVERED BY [employer]ET[employer]-IF NOT, ONLY USE IF DISCUSS WITH PATIENT   PLAN FOR NEXT SESSION: continue to work on bil LE flexibility with emphasis on L, Piriformis stretching, continue core strengthening    , PT, DPT 12/31/21 7:08 PM

## 2022-01-01 ENCOUNTER — Ambulatory Visit: Payer: Medicare HMO

## 2022-01-01 DIAGNOSIS — M5459 Other low back pain: Secondary | ICD-10-CM | POA: Diagnosis not present

## 2022-01-01 DIAGNOSIS — M79604 Pain in right leg: Secondary | ICD-10-CM

## 2022-01-01 DIAGNOSIS — R2689 Other abnormalities of gait and mobility: Secondary | ICD-10-CM

## 2022-01-01 DIAGNOSIS — M79605 Pain in left leg: Secondary | ICD-10-CM | POA: Diagnosis not present

## 2022-01-01 NOTE — Therapy (Signed)
OUTPATIENT PHYSICAL THERAPY TREATMENT NOTE   Patient Details: Patient Name: Candice Warner MRN: [id number] DOB:12/11/1950, 71 y.o., female Today's Date: 01/01/2022  PCP: Lavonda Jumbo, DO REFERRING PROVIDER: Denny Levy, MD  PT End of Session - 01/01/22 1656     Visit Number 12    Number of Visits 18    Date for PT Re-Evaluation 01/17/22    Authorization Type [employer] MCR    Authorization Time Period FOTO v6, v10    Progress Note Due on Visit 10    PT Start Time 1658    PT Stop Time 1738    PT Time Calculation (min) 40 min    Activity Tolerance Patient tolerated treatment well;No increased pain    Behavior During Therapy [facility] for tasks assessed/performed                       Past Medical History:  Diagnosis Date   Arthritis    "right hip" (06/27/2018)   Depression    Hyperlipidemia    Hypertension    Hypokalemia    Hypothyroidism    Lumbar back pain    Obesity    Radiculopathy    Thyroid disease    Type II diabetes mellitus (HCC)    Past Surgical History:  Procedure Laterality Date   CYST REMOVAL HAND Right 11/07/2015   Procedure: EXCISION OF VOLAR RETANICULUM RIGHT HAND;  Surgeon: Loreta Ave, MD;  Location: [facility];  Service: Orthopedics;  Laterality: Right;   SHOULDER ARTHROSCOPY Left 2002   SHOULDER ARTHROSCOPY WITH ROTATOR CUFF REPAIR AND SUBACROMIAL DECOMPRESSION  07/14/2012   Procedure: SHOULDER ARTHROSCOPY WITH ROTATOR CUFF REPAIR AND SUBACROMIAL DECOMPRESSION;  Surgeon: Loreta Ave, MD;  Location: [facility];  Service: Orthopedics;  Laterality: Right;  Right Shoulder Arthroscopy, Distal Claviculectomy, Subacromial Decompression, Partial Acromioplasty with Coracoacromial Release with Arthroscopic Rotator Cuff Repair    THYROID LOBECTOMY Right    Goiter s/p right thyroidectomy   TRIGGER FINGER RELEASE Right 11/07/2015   Procedure: RELEASE TRIGGER FINGER/A-1 PULLEY RIGHT RING FINGER ;  Surgeon: Loreta Ave, MD;  Location: [facility];  Service: Orthopedics;  Laterality: Right;   TUBAL LIGATION     Patient Active Problem List   Diagnosis Date Noted   Calcification of left breast 06/26/2019   Osteoarthritis 03/15/2019   Nodule of chest wall 06/21/2015   Trigger finger, acquired 10/23/2014   Right hip pain 06/01/2011   HLD (hyperlipidemia) 04/16/2009   Type 2 diabetes mellitus without complication, without long-term current use of insulin (HCC) 10/29/2008   DEGENERATIVE JOINT DISEASE, HIPS 10/29/2008   Hypothyroidism 08/12/2006   OBESITY, NOS 08/12/2006   Major depressive disorder, recurrent episode (HCC) 08/12/2006   HYPERTENSION, BENIGN SYSTEMIC 08/12/2006   DIVERTICULOSIS OF COLON 08/12/2006    REFERRING DIAG: Hamstring tightness bil LEs  THERAPY DIAG:  Pain in left leg  Other abnormalities of gait and mobility  Other low back pain  Pain in right leg  Rationale for Evaluation and Treatment Rehabilitation  PERTINENT HISTORY: Per chart review 09/10/21: Continued leg pain for 2 months. Worsening with recent hospitalization for pyelonephritis but has improved significantly since being discharge. Worse with climbing stairs and getting out of the car. She has been trying stretching exercises at home. She denies tingling, numbness. Denies hip and knee pain. Pain is located at the back of thighs bilaterally  PRECAUTIONS: None  SUBJECTIVE: Pt reports continued low back and thigh pain today. She reports  feeling moderate soreness following yesterday's session.  Pain: Are you having pain? Yes NPRS: -9/10 Pain Location: bilateral posterior hips and BIL knees Pain Frequency/Description: intermittent  Aggravating Factors: stairs Relieving Factors: rest  OBJECTIVE:    LOWER EXTREMITY ROM:   Passive ROM Right eval Left eval  Hip flexion Limited to 90  degrees Limited to 90 degrees  Hip extension      Hip abduction limited limited  Hip adduction limited  limited  Hip internal rotation limited limited  Hip external rotation Limited  limited  Knee flexion WNL WNL  Knee extension WNL WNL  Ankle dorsiflexion WNL WNL  Ankle plantarflexion      Ankle inversion      Ankle eversion       (Blank rows = not tested) Lumbar AROM standing: flexion limited 25%. Extension WNL All hip PROM limited all planes due to pain; pt very pain dominant bil LEs   LOWER EXTREMITY MMT:   MMT Right eval Left eval Right 12/03/2021 Left 12/03/2021  Hip flexion 4+/5 4/5 4+/5 4+/5  Hip extension        Hip abduction 4/5 4/5 5/5 4+/5  Hip adduction 4/5 4/5 5/5 5/5  Hip internal rotation        Hip external rotation        Knee flexion 4+/5 4/5 5/5 4/5 Lt hip pain  Knee extension 4+/5 4/5 5/5 5/5  Ankle dorsiflexion 4+/5 4/5 5/5 5/5  Ankle plantarflexion        Ankle inversion        Ankle eversion         (Blank rows = not tested)  12/31/2021: FOTO:  29%   TODAY'S TREATMENT:  [facility] Adult PT Treatment:                                                DATE: 01/01/2022 Therapeutic Exercise: NuStep LEs only level 5 x 5 min while taking subjective Sidelying lumbar open books 2x10 BIL Bridge with marching 3x10 Prone quad stretch with green strap x2 minutes BIL Supine 90/90 abdominal isometric with handhold resistance 4x30sec Thomas stretch x16min BIL Manual Therapy: Prone, effleurage and tapotement to BIL lumbar paraspinals/ QL x10 minutes Neuromuscular re-ed: N/A Therapeutic Activity: N/A Modalities: N/A Self Care: N/A  [facility] Adult PT Treatment:                                                DATE: 12/31/2021 Therapeutic Exercise: Staggered bridge 2x8 BIL Thomas stretch x26min BIL Seated active hamstring stretch x14min BIL Prone hamstring curl with 3# ankle weights 3x10 with slow, eccentric lowering Prone knee hangs with 3# ankle weights 3x30sec Manual Therapy: N/A Neuromuscular re-ed: N/A Therapeutic Activity: Re-administration of FOTO with pt  education Dead lift with 10# kettlebell in front of table 3x5 Modalities: N/A Self Care: N/A   [facility] Adult PT Treatment:                                                DATE: 12/25/2021 Therapeutic Exercise: NuStep LEs only level 5 x 5 min while taking  subjective Supine hamstring stretch with strap 2x30" each Supine SLR 2x10  Contract/relax of hamstrings  Slant board stretch 2x45"  Standing hip abd/ext 2x10 25# Standing mini squat 2x10  Standing hamstring stretch 2x30" each Step ups fwd 10in 2x10 ea   PATIENT EDUCATION:  Education details: HEP issued Person educated: Patient Education method: Explanation Education comprehension: verbalized understanding     HOME EXERCISE PROGRAM:  Access Code: [facility] URL: [email or web address] Date: 11/19/2021 Prepared by: Karie Mainland  Exercises - Forward T with Counter Support  - 1 x daily - 7 x weekly - 2 sets - 10 reps - 5 hold - Kneeling Plank on Forearms with Scapular Protraction Retraction AROM  - 1 x daily - 7 x weekly - 1 sets - 5 reps - 10-30 sec hold - Prone Hip Extension with Pillow Under Abdomen  - 2 x daily - 7 x weekly - 2 sets - 10 reps - 5 hold - Quadruped Rocking Slow  - 2 x daily - 7 x weekly - 1 sets - 10 reps - 15 hold - Supine Bridge  - 2 x daily - 7 x weekly - 2 sets - 10 reps - 5 hold - Supine Hamstring Stretch with Strap  - 2 x daily - 7 x weekly - 1 sets - 5 reps - 30 hold - Supine Lower Trunk Rotation  - 2 x daily - 7 x weekly - 2 sets - 10 reps - 10 hold - Supine Piriformis Stretch with Foot on Ground  - 2 x daily - 7 x weekly - 1 sets - 5 reps - 30 hold - Supine Single Knee to Chest Stretch  - 2 x daily - 7 x weekly - 1 sets - 5 reps - 30 hold   ASSESSMENT:   CLINICAL IMPRESSION: Pt reports continued high levels of pain today and requests manual therapy. She reports a therapeutic response to manual techniques today with a decrease in pain from 8/10 to 5/10 following intervention. She also  responded well to exercises today. Pt will benefit from skilled PT to address her primary impairments and return to her prior level of function with less limitation.     OBJECTIVE IMPAIRMENTS Abnormal gait, decreased activity tolerance, decreased coordination, decreased mobility, difficulty walking, decreased ROM, decreased strength, hypomobility, increased muscle spasms, impaired flexibility, postural dysfunction, and pain.    ACTIVITY LIMITATIONS carrying, bending, sitting, standing, squatting, sleeping, stairs, transfers, bed mobility, hygiene/grooming, locomotion level, and caring for others   PARTICIPATION LIMITATIONS: meal prep, cleaning, laundry, driving, shopping, community activity, and occupation   PERSONAL FACTORS 1 comorbidity: OA  are also affecting patient's functional outcome.         GOALS: Goals reviewed with patient? Yes   SHORT TERM GOALS: Target date: 11/22/2021    Pt will be independent with initial HEP Baseline:not issued at eval due to time constraints 11/27/2021: Pt reports daily adherence to her HEP Goal status: ACHIEVED   2.  Pt will report ability to sit and watch TV with 25% less difficulty Baseline: unable > 10 min before change in position is needed Goal status: ONGOING     LONG TERM GOALS: Target date:  01/17/22     Pt will be able to go up the stairs without feeling like the left leg will give out >/=75% of the time Baseline: unable at eval 12/13/21 giving out 45% of the time Goal status: ONGOING   2.  Pt will be able to walk with </=4/10 bil LE pain >/=  15 min Baseline: 9/10 at eval 12/13/21: 7/10 12/03/2021: Pt reports being able to walk 10 minutes before needing a seated rest break due to pain Goal status: PROGRESSING   3.  Pt will be able to stand with </= 4/10 bil LE pain x 20 min without the need to sit down to wash dishes Baseline: 8/10 bil LE pain 12/03/2021 and 12/13/21: Pt reports being able to stand 7-10 minutes before needing to sit due to  pain Goal status: PROGRESSING   4.  Pt will be able to sit >45 min with 75% less difficulty (esp at L ischial tuberosity) Baseline: 10 min; L glute more painful with sitting but both hurt 12/03/2021: 15 minutes 12/13/21: 30 min Goal status: PROGRESSING   5.  Pt will increase bil LE strength >/=1/2 MMT grade to assist with improved functional mobility Baseline:  MMT Right eval Left eval  Hip flexion 4+/5 4/5  Hip extension      Hip abduction 4/5 4/5  Hip adduction 4/5 4/5  Hip internal rotation      Hip external rotation      Knee flexion 4+/5 4/5  Knee extension 4+/5 4/5  Ankle dorsiflexion 4+/5 4/5  Ankle plantarflexion      Ankle inversion      Ankle eversion        12/03/2021 and 12/13/21: See updated MMT chart Goal status: PROGRESSING    6.  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 IONTO MAY NOT BE COVERED BY [employer]ET[employer]-IF NOT, ONLY USE IF DISCUSS WITH PATIENT   PLAN FOR NEXT SESSION: continue to work on bil LE flexibility with emphasis on L, Piriformis stretching, continue core strengthening    , PT, DPT 01/01/22 5:40 PM

## 2022-01-07 ENCOUNTER — Ambulatory Visit: Payer: Medicare HMO

## 2022-01-07 DIAGNOSIS — R2689 Other abnormalities of gait and mobility: Secondary | ICD-10-CM | POA: Diagnosis not present

## 2022-01-07 DIAGNOSIS — M5459 Other low back pain: Secondary | ICD-10-CM

## 2022-01-07 DIAGNOSIS — M79605 Pain in left leg: Secondary | ICD-10-CM

## 2022-01-07 DIAGNOSIS — M79604 Pain in right leg: Secondary | ICD-10-CM

## 2022-01-07 NOTE — Therapy (Signed)
OUTPATIENT PHYSICAL THERAPY TREATMENT NOTE   Patient Details: Patient Name: Candice Warner MRN: [id number] DOB:28-Aug-1950, 71 y.o., female Today's Date: 01/07/2022  PCP: Lavonda Jumbo, DO REFERRING PROVIDER: Denny Levy, MD  PT End of Session - 01/07/22 1823     Visit Number 13    Number of Visits 18    Date for PT Re-Evaluation 01/17/22    Authorization Type [employer] MCR    Authorization Time Period FOTO v6, v10    Progress Note Due on Visit 10    PT Start Time 1824    PT Stop Time 1906    PT Time Calculation (min) 42 min    Activity Tolerance Patient tolerated treatment well;No increased pain    Behavior During Therapy [facility] for tasks assessed/performed                        Past Medical History:  Diagnosis Date   Arthritis    "right hip" (06/27/2018)   Depression    Hyperlipidemia    Hypertension    Hypokalemia    Hypothyroidism    Lumbar back pain    Obesity    Radiculopathy    Thyroid disease    Type II diabetes mellitus (HCC)    Past Surgical History:  Procedure Laterality Date   CYST REMOVAL HAND Right 11/07/2015   Procedure: EXCISION OF VOLAR RETANICULUM RIGHT HAND;  Surgeon: Loreta Ave, MD;  Location: [facility];  Service: Orthopedics;  Laterality: Right;   SHOULDER ARTHROSCOPY Left 2002   SHOULDER ARTHROSCOPY WITH ROTATOR CUFF REPAIR AND SUBACROMIAL DECOMPRESSION  07/14/2012   Procedure: SHOULDER ARTHROSCOPY WITH ROTATOR CUFF REPAIR AND SUBACROMIAL DECOMPRESSION;  Surgeon: Loreta Ave, MD;  Location: [facility];  Service: Orthopedics;  Laterality: Right;  Right Shoulder Arthroscopy, Distal Claviculectomy, Subacromial Decompression, Partial Acromioplasty with Coracoacromial Release with Arthroscopic Rotator Cuff Repair    THYROID LOBECTOMY Right    Goiter s/p right thyroidectomy   TRIGGER FINGER RELEASE Right 11/07/2015   Procedure: RELEASE TRIGGER FINGER/A-1 PULLEY RIGHT RING FINGER ;  Surgeon: Loreta Ave, MD;  Location: [facility];  Service: Orthopedics;  Laterality: Right;   TUBAL LIGATION     Patient Active Problem List   Diagnosis Date Noted   Calcification of left breast 06/26/2019   Osteoarthritis 03/15/2019   Nodule of chest wall 06/21/2015   Trigger finger, acquired 10/23/2014   Right hip pain 06/01/2011   HLD (hyperlipidemia) 04/16/2009   Type 2 diabetes mellitus without complication, without long-term current use of insulin (HCC) 10/29/2008   DEGENERATIVE JOINT DISEASE, HIPS 10/29/2008   Hypothyroidism 08/12/2006   OBESITY, NOS 08/12/2006   Major depressive disorder, recurrent episode (HCC) 08/12/2006   HYPERTENSION, BENIGN SYSTEMIC 08/12/2006   DIVERTICULOSIS OF COLON 08/12/2006    REFERRING DIAG: Hamstring tightness bil LEs  THERAPY DIAG:  Pain in left leg  Other abnormalities of gait and mobility  Other low back pain  Pain in right leg  Rationale for Evaluation and Treatment Rehabilitation  PERTINENT HISTORY: Per chart review 09/10/21: Continued leg pain for 2 months. Worsening with recent hospitalization for pyelonephritis but has improved significantly since being discharge. Worse with climbing stairs and getting out of the car. She has been trying stretching exercises at home. She denies tingling, numbness. Denies hip and knee pain. Pain is located at the back of thighs bilaterally  PRECAUTIONS: None  SUBJECTIVE: Pt reports that manual techniques really helped at her last  visit. She reports no back or leg pain today, reporting only posterior thigh pain when reaching up. She reports continued adherence to her HEP.  Pain: Are you having pain? Yes NPRS: 0/10 Pain Location: bilateral posterior hips and BIL knees Pain Frequency/Description: intermittent  Aggravating Factors: stairs Relieving Factors: rest  OBJECTIVE:    LOWER EXTREMITY ROM:   Passive ROM Right eval Left eval  Hip flexion Limited to 90  degrees Limited to 90  degrees  Hip extension      Hip abduction limited limited  Hip adduction limited limited  Hip internal rotation limited limited  Hip external rotation Limited  limited  Knee flexion WNL WNL  Knee extension WNL WNL  Ankle dorsiflexion WNL WNL  Ankle plantarflexion      Ankle inversion      Ankle eversion       (Blank rows = not tested) Lumbar AROM standing: flexion limited 25%. Extension WNL All hip PROM limited all planes due to pain; pt very pain dominant bil LEs   LOWER EXTREMITY MMT:   MMT Right eval Left eval Right 12/03/2021 Left 12/03/2021  Hip flexion 4+/5 4/5 4+/5 4+/5  Hip extension        Hip abduction 4/5 4/5 5/5 4+/5  Hip adduction 4/5 4/5 5/5 5/5  Hip internal rotation        Hip external rotation        Knee flexion 4+/5 4/5 5/5 4/5 Lt hip pain  Knee extension 4+/5 4/5 5/5 5/5  Ankle dorsiflexion 4+/5 4/5 5/5 5/5  Ankle plantarflexion        Ankle inversion        Ankle eversion         (Blank rows = not tested)  12/31/2021: FOTO:  29%   TODAY'S TREATMENT:  [facility] Adult PT Treatment:                                                DATE: 01/07/2022 Therapeutic Exercise: Seated active hamstring stretch x11min BIL Straight arm reverse crunches 3x8 Standing hamstring curls with 10# ankle attachment at [employer] machine 2x10 BIL Squat with two 13# cables to waist attachment at [employer] machine 3x10 Standing Pallof press with 10# cable 2x10 with 5-sec hold BIL Side knee plank 2x10 BIL Sidelying Lt IT band stretch x2 min Manual Therapy: N/A Neuromuscular re-ed: N/A Therapeutic Activity: N/A Modalities: N/A Self Care: N/A  [facility] Adult PT Treatment:                                                DATE: 01/01/2022 Therapeutic Exercise: NuStep LEs only level 5 x 5 min while taking subjective Sidelying lumbar open books 2x10 BIL Bridge with marching 3x10 Prone quad stretch with green strap x2 minutes BIL Supine 90/90 abdominal isometric with handhold  resistance 4x30sec Thomas stretch x6min BIL Manual Therapy: Prone, effleurage and tapotement to BIL lumbar paraspinals/ QL x10 minutes Neuromuscular re-ed: N/A Therapeutic Activity: N/A Modalities: N/A Self Care: N/A  [facility] Adult PT Treatment:  DATE: 12/31/2021 Therapeutic Exercise: Staggered bridge 2x8 BIL Thomas stretch x41min BIL Seated active hamstring stretch x62min BIL Prone hamstring curl with 3# ankle weights 3x10 with slow, eccentric lowering Prone knee hangs with 3# ankle weights 3x30sec Manual Therapy: N/A Neuromuscular re-ed: N/A Therapeutic Activity: Re-administration of FOTO with pt education Dead lift with 10# kettlebell in front of table 3x5 Modalities: N/A Self Care: N/A     PATIENT EDUCATION:  Education details: HEP issued Person educated: Patient Education method: Explanation Education comprehension: verbalized understanding     HOME EXERCISE PROGRAM:  Access Code: [facility] URL: [email or web address] Date: 11/19/2021 Prepared by: Karie Mainland  Exercises - Forward T with Counter Support  - 1 x daily - 7 x weekly - 2 sets - 10 reps - 5 hold - Kneeling Plank on Forearms with Scapular Protraction Retraction AROM  - 1 x daily - 7 x weekly - 1 sets - 5 reps - 10-30 sec hold - Prone Hip Extension with Pillow Under Abdomen  - 2 x daily - 7 x weekly - 2 sets - 10 reps - 5 hold - Quadruped Rocking Slow  - 2 x daily - 7 x weekly - 1 sets - 10 reps - 15 hold - Supine Bridge  - 2 x daily - 7 x weekly - 2 sets - 10 reps - 5 hold - Supine Hamstring Stretch with Strap  - 2 x daily - 7 x weekly - 1 sets - 5 reps - 30 hold - Supine Lower Trunk Rotation  - 2 x daily - 7 x weekly - 2 sets - 10 reps - 10 hold - Supine Piriformis Stretch with Foot on Ground  - 2 x daily - 7 x weekly - 1 sets - 5 reps - 30 hold - Supine Single Knee to Chest Stretch  - 2 x daily - 7 x weekly - 1 sets - 5 reps - 30  hold   ASSESSMENT:   CLINICAL IMPRESSION: Pt responded excellently to all progressed exercises today, demonstrating good form and no pain. Improvements in pain from manual techniques at last treatment were maintained throughout the session. She will continue to benefit from skilled PT to address her primary impairments and return to her prior level of function with less limitation.     OBJECTIVE IMPAIRMENTS Abnormal gait, decreased activity tolerance, decreased coordination, decreased mobility, difficulty walking, decreased ROM, decreased strength, hypomobility, increased muscle spasms, impaired flexibility, postural dysfunction, and pain.    ACTIVITY LIMITATIONS carrying, bending, sitting, standing, squatting, sleeping, stairs, transfers, bed mobility, hygiene/grooming, locomotion level, and caring for others   PARTICIPATION LIMITATIONS: meal prep, cleaning, laundry, driving, shopping, community activity, and occupation   PERSONAL FACTORS 1 comorbidity: OA  are also affecting patient's functional outcome.         GOALS: Goals reviewed with patient? Yes   SHORT TERM GOALS: Target date: 11/22/2021    Pt will be independent with initial HEP Baseline:not issued at eval due to time constraints 11/27/2021: Pt reports daily adherence to her HEP Goal status: ACHIEVED   2.  Pt will report ability to sit and watch TV with 25% less difficulty Baseline: unable > 10 min before change in position is needed Goal status: ONGOING     LONG TERM GOALS: Target date:  01/17/22     Pt will be able to go up the stairs without feeling like the left leg will give out >/=75% of the time Baseline: unable at eval 12/13/21 giving out 45% of the time  Goal status: ONGOING   2.  Pt will be able to walk with </=4/10 bil LE pain >/=15 min Baseline: 9/10 at eval 12/13/21: 7/10 12/03/2021: Pt reports being able to walk 10 minutes before needing a seated rest break due to pain Goal status: PROGRESSING   3.  Pt  will be able to stand with </= 4/10 bil LE pain x 20 min without the need to sit down to wash dishes Baseline: 8/10 bil LE pain 12/03/2021 and 12/13/21: Pt reports being able to stand 7-10 minutes before needing to sit due to pain Goal status: PROGRESSING   4.  Pt will be able to sit >45 min with 75% less difficulty (esp at L ischial tuberosity) Baseline: 10 min; L glute more painful with sitting but both hurt 12/03/2021: 15 minutes 12/13/21: 30 min Goal status: PROGRESSING   5.  Pt will increase bil LE strength >/=1/2 MMT grade to assist with improved functional mobility Baseline:  MMT Right eval Left eval  Hip flexion 4+/5 4/5  Hip extension      Hip abduction 4/5 4/5  Hip adduction 4/5 4/5  Hip internal rotation      Hip external rotation      Knee flexion 4+/5 4/5  Knee extension 4+/5 4/5  Ankle dorsiflexion 4+/5 4/5  Ankle plantarflexion      Ankle inversion      Ankle eversion        12/03/2021 and 12/13/21: See updated MMT chart Goal status: PROGRESSING    6.  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 IONTO MAY NOT BE COVERED BY [employer]ET[employer]-IF NOT, ONLY USE IF DISCUSS WITH PATIENT   PLAN FOR NEXT SESSION: continue to work on bil LE flexibility with emphasis on L, Piriformis stretching, continue core strengthening    , PT, DPT 01/07/22 7:06 PM

## 2022-01-10 ENCOUNTER — Ambulatory Visit: Payer: Medicare HMO

## 2022-01-10 DIAGNOSIS — M5459 Other low back pain: Secondary | ICD-10-CM | POA: Diagnosis not present

## 2022-01-10 DIAGNOSIS — R2689 Other abnormalities of gait and mobility: Secondary | ICD-10-CM

## 2022-01-10 DIAGNOSIS — M79604 Pain in right leg: Secondary | ICD-10-CM | POA: Diagnosis not present

## 2022-01-10 DIAGNOSIS — M79605 Pain in left leg: Secondary | ICD-10-CM

## 2022-01-10 NOTE — Therapy (Signed)
OUTPATIENT PHYSICAL THERAPY TREATMENT NOTE   Patient Details: Patient Name: Candice Warner MRN: [id number] DOB:December 31, 1950, 71 y.o., female Today's Date: 01/10/2022  PCP: Lavonda Jumbo, DO REFERRING PROVIDER: Denny Levy, MD  PT End of Session - 01/10/22 1121     Visit Number 14    Number of Visits 18    Date for PT Re-Evaluation 01/17/22    Authorization Type [employer]et[employer] MCR    Progress Note Due on Visit 10    PT Start Time 1115    PT Stop Time 1155    PT Time Calculation (min) 40 min    Activity Tolerance Patient tolerated treatment well;No increased pain    Behavior During Therapy [facility] for tasks assessed/performed                         Past Medical History:  Diagnosis Date   Arthritis    "right hip" (06/27/2018)   Depression    Hyperlipidemia    Hypertension    Hypokalemia    Hypothyroidism    Lumbar back pain    Obesity    Radiculopathy    Thyroid disease    Type II diabetes mellitus (HCC)    Past Surgical History:  Procedure Laterality Date   CYST REMOVAL HAND Right 11/07/2015   Procedure: EXCISION OF VOLAR RETANICULUM RIGHT HAND;  Surgeon: Loreta Ave, MD;  Location: [facility];  Service: Orthopedics;  Laterality: Right;   SHOULDER ARTHROSCOPY Left 2002   SHOULDER ARTHROSCOPY WITH ROTATOR CUFF REPAIR AND SUBACROMIAL DECOMPRESSION  07/14/2012   Procedure: SHOULDER ARTHROSCOPY WITH ROTATOR CUFF REPAIR AND SUBACROMIAL DECOMPRESSION;  Surgeon: Loreta Ave, MD;  Location: [facility];  Service: Orthopedics;  Laterality: Right;  Right Shoulder Arthroscopy, Distal Claviculectomy, Subacromial Decompression, Partial Acromioplasty with Coracoacromial Release with Arthroscopic Rotator Cuff Repair    THYROID LOBECTOMY Right    Goiter s/p right thyroidectomy   TRIGGER FINGER RELEASE Right 11/07/2015   Procedure: RELEASE TRIGGER FINGER/A-1 PULLEY RIGHT RING FINGER ;  Surgeon: Loreta Ave, MD;  Location: [facility];  Service: Orthopedics;  Laterality: Right;   TUBAL LIGATION     Patient Active Problem List   Diagnosis Date Noted   Calcification of left breast 06/26/2019   Osteoarthritis 03/15/2019   Nodule of chest wall 06/21/2015   Trigger finger, acquired 10/23/2014   Right hip pain 06/01/2011   HLD (hyperlipidemia) 04/16/2009   Type 2 diabetes mellitus without complication, without long-term current use of insulin (HCC) 10/29/2008   DEGENERATIVE JOINT DISEASE, HIPS 10/29/2008   Hypothyroidism 08/12/2006   OBESITY, NOS 08/12/2006   Major depressive disorder, recurrent episode (HCC) 08/12/2006   HYPERTENSION, BENIGN SYSTEMIC 08/12/2006   DIVERTICULOSIS OF COLON 08/12/2006    REFERRING DIAG: Hamstring tightness bil LEs  THERAPY DIAG:  Pain in left leg  Other abnormalities of gait and mobility  Other low back pain  Pain in right leg  Rationale for Evaluation and Treatment Rehabilitation  PERTINENT HISTORY: Per chart review 09/10/21: Continued leg pain for 2 months. Worsening with recent hospitalization for pyelonephritis but has improved significantly since being discharge. Worse with climbing stairs and getting out of the car. She has been trying stretching exercises at home. She denies tingling, numbness. Denies hip and knee pain. Pain is located at the back of thighs bilaterally  PRECAUTIONS: None  SUBJECTIVE: Pt reports continued low-level Lt posterior thigh pain today, adding that thunderstorms last night increased her pain. She  reports adherence to her HEP.  Pain: Are you having pain? Yes NPRS: 0/10 Pain Location: bilateral posterior hips and BIL knees Pain Frequency/Description: intermittent  Aggravating Factors: stairs Relieving Factors: rest  OBJECTIVE:    LOWER EXTREMITY ROM:   Passive ROM Right eval Left eval  Hip flexion Limited to 90  degrees Limited to 90 degrees  Hip extension      Hip abduction limited limited  Hip adduction limited limited  Hip  internal rotation limited limited  Hip external rotation Limited  limited  Knee flexion WNL WNL  Knee extension WNL WNL  Ankle dorsiflexion WNL WNL  Ankle plantarflexion      Ankle inversion      Ankle eversion       (Blank rows = not tested) Lumbar AROM standing: flexion limited 25%. Extension WNL All hip PROM limited all planes due to pain; pt very pain dominant bil LEs   LOWER EXTREMITY MMT:   MMT Right eval Left eval Right 12/03/2021 Left 12/03/2021  Hip flexion 4+/5 4/5 4+/5 4+/5  Hip extension        Hip abduction 4/5 4/5 5/5 4+/5  Hip adduction 4/5 4/5 5/5 5/5  Hip internal rotation        Hip external rotation        Knee flexion 4+/5 4/5 5/5 4/5 Lt hip pain  Knee extension 4+/5 4/5 5/5 5/5  Ankle dorsiflexion 4+/5 4/5 5/5 5/5  Ankle plantarflexion        Ankle inversion        Ankle eversion         (Blank rows = not tested)  12/31/2021: FOTO:  29%   TODAY'S TREATMENT:  [facility] Adult PT Treatment:                                                DATE: 01/10/2022 Therapeutic Exercise: NuStep x5 minutes while collecting subjective information Seated active hamstring stretch  x37min BIL Side lunge with 15# kettlebell 2x10 [place] split with 5# kettlebell and contralateral UE support 2x10 BIL Dead bugs with alternating handhold resistance at thigh 2x20 Bridge with 15# kettlebell 2x10 with 5-sec hold DKTC stretch x89min Manual Therapy: N/A Neuromuscular re-ed: N/A Therapeutic Activity: N/A Modalities: N/A Self Care: N/A  [facility] Adult PT Treatment:                                                DATE: 01/07/2022 Therapeutic Exercise: Seated active hamstring stretch x22min BIL Straight arm reverse crunches 3x8 Standing hamstring curls with 10# ankle attachment at [employer] machine 2x10 BIL Squat with two 13# cables to waist attachment at [employer] machine 3x10 Standing Pallof press with 10# cable 2x10 with 5-sec hold BIL Side knee plank 2x10 BIL Sidelying Lt IT  band stretch x2 min Manual Therapy: N/A Neuromuscular re-ed: N/A Therapeutic Activity: N/A Modalities: N/A Self Care: N/A  [facility] Adult PT Treatment:                                                DATE: 01/01/2022 Therapeutic Exercise: NuStep LEs only level  5 x 5 min while taking subjective Sidelying lumbar open books 2x10 BIL Bridge with marching 3x10 Prone quad stretch with green strap x2 minutes BIL Supine 90/90 abdominal isometric with handhold resistance 4x30sec Thomas stretch x82min BIL Manual Therapy: Prone, effleurage and tapotement to BIL lumbar paraspinals/ QL x10 minutes Neuromuscular re-ed: N/A Therapeutic Activity: N/A Modalities: N/A Self Care: N/A     PATIENT EDUCATION:  Education details: HEP issued Person educated: Patient Education method: Explanation Education comprehension: verbalized understanding     HOME EXERCISE PROGRAM:  Access Code: [facility] URL: [email or web address] Date: 11/19/2021 Prepared by: Karie Mainland  Exercises - Forward T with Counter Support  - 1 x daily - 7 x weekly - 2 sets - 10 reps - 5 hold - Kneeling Plank on Forearms with Scapular Protraction Retraction AROM  - 1 x daily - 7 x weekly - 1 sets - 5 reps - 10-30 sec hold - Prone Hip Extension with Pillow Under Abdomen  - 2 x daily - 7 x weekly - 2 sets - 10 reps - 5 hold - Quadruped Rocking Slow  - 2 x daily - 7 x weekly - 1 sets - 10 reps - 15 hold - Supine Bridge  - 2 x daily - 7 x weekly - 2 sets - 10 reps - 5 hold - Supine Hamstring Stretch with Strap  - 2 x daily - 7 x weekly - 1 sets - 5 reps - 30 hold - Supine Lower Trunk Rotation  - 2 x daily - 7 x weekly - 2 sets - 10 reps - 10 hold - Supine Piriformis Stretch with Foot on Ground  - 2 x daily - 7 x weekly - 1 sets - 5 reps - 30 hold - Supine Single Knee to Chest Stretch  - 2 x daily - 7 x weekly - 1 sets - 5 reps - 30 hold   ASSESSMENT:   CLINICAL IMPRESSION: Pt responded well to new exercises  today, demonstrating good form and no increase in pain. She reports that overall, she is feeling much better, although she continues to have low-level pain. She will continue to benefit from skilled PT to address her primary impairments and return to her prior level of function with less limitation.     OBJECTIVE IMPAIRMENTS Abnormal gait, decreased activity tolerance, decreased coordination, decreased mobility, difficulty walking, decreased ROM, decreased strength, hypomobility, increased muscle spasms, impaired flexibility, postural dysfunction, and pain.    ACTIVITY LIMITATIONS carrying, bending, sitting, standing, squatting, sleeping, stairs, transfers, bed mobility, hygiene/grooming, locomotion level, and caring for others   PARTICIPATION LIMITATIONS: meal prep, cleaning, laundry, driving, shopping, community activity, and occupation   PERSONAL FACTORS 1 comorbidity: OA  are also affecting patient's functional outcome.         GOALS: Goals reviewed with patient? Yes   SHORT TERM GOALS: Target date: 11/22/2021    Pt will be independent with initial HEP Baseline:not issued at eval due to time constraints 11/27/2021: Pt reports daily adherence to her HEP Goal status: ACHIEVED   2.  Pt will report ability to sit and watch TV with 25% less difficulty Baseline: unable > 10 min before change in position is needed Goal status: ONGOING     LONG TERM GOALS: Target date:  01/17/22     Pt will be able to go up the stairs without feeling like the left leg will give out >/=75% of the time Baseline: unable at eval 12/13/21 giving out 45% of the time Goal status:  ONGOING   2.  Pt will be able to walk with </=4/10 bil LE pain >/=15 min Baseline: 9/10 at eval 12/13/21: 7/10 12/03/2021: Pt reports being able to walk 10 minutes before needing a seated rest break due to pain Goal status: PROGRESSING   3.  Pt will be able to stand with </= 4/10 bil LE pain x 20 min without the need to sit down to  wash dishes Baseline: 8/10 bil LE pain 12/03/2021 and 12/13/21: Pt reports being able to stand 7-10 minutes before needing to sit due to pain Goal status: PROGRESSING   4.  Pt will be able to sit >45 min with 75% less difficulty (esp at L ischial tuberosity) Baseline: 10 min; L glute more painful with sitting but both hurt 12/03/2021: 15 minutes 12/13/21: 30 min Goal status: PROGRESSING   5.  Pt will increase bil LE strength >/=1/2 MMT grade to assist with improved functional mobility Baseline:  MMT Right eval Left eval  Hip flexion 4+/5 4/5  Hip extension      Hip abduction 4/5 4/5  Hip adduction 4/5 4/5  Hip internal rotation      Hip external rotation      Knee flexion 4+/5 4/5  Knee extension 4+/5 4/5  Ankle dorsiflexion 4+/5 4/5  Ankle plantarflexion      Ankle inversion      Ankle eversion        12/03/2021 and 12/13/21: See updated MMT chart Goal status: PROGRESSING    6.  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 IONTO MAY NOT BE COVERED BY AETNA-IF NOT, ONLY USE IF DISCUSS WITH PATIENT   PLAN FOR NEXT SESSION: continue to work on bil LE flexibility with emphasis on L, Piriformis stretching, continue core strengthening, plan D/C , PT, DPT 01/10/22 11:55 AM

## 2022-01-13 ENCOUNTER — Ambulatory Visit: Payer: Medicare HMO | Attending: Family Medicine

## 2022-01-13 DIAGNOSIS — M5459 Other low back pain: Secondary | ICD-10-CM | POA: Insufficient documentation

## 2022-01-13 DIAGNOSIS — M79604 Pain in right leg: Secondary | ICD-10-CM | POA: Diagnosis not present

## 2022-01-13 DIAGNOSIS — R2689 Other abnormalities of gait and mobility: Secondary | ICD-10-CM | POA: Insufficient documentation

## 2022-01-13 DIAGNOSIS — M79605 Pain in left leg: Secondary | ICD-10-CM | POA: Insufficient documentation

## 2022-01-13 NOTE — Therapy (Addendum)
OUTPATIENT PHYSICAL THERAPY TREATMENT NOTE/ DISCHARGE SUMMARY   Patient Details: Patient Name: Candice Warner MRN: [id number] DOB:August 16, 1950, 71 y.o., female Today's Date: 01/13/2022  PCP: Gerlene Fee, DO REFERRING PROVIDER: Dorcas Mcmurray, MD  PT End of Session - 01/13/22 1830     Visit Number 15    Number of Visits 18    Date for PT Re-Evaluation 01/17/22    Authorization Type [employer] MCR    Authorization Time Period FOTO v6, v10    Progress Note Due on Visit 10    PT Start Time 1830    PT Stop Time 1908    PT Time Calculation (min) 38 min    Activity Tolerance Patient tolerated treatment well;No increased pain    Behavior During Therapy [facility] for tasks assessed/performed                          Past Medical History:  Diagnosis Date   Arthritis    "right hip" (06/27/2018)   Depression    Hyperlipidemia    Hypertension    Hypokalemia    Hypothyroidism    Lumbar back pain    Obesity    Radiculopathy    Thyroid disease    Type II diabetes mellitus ([place])    Past Surgical History:  Procedure Laterality Date   CYST REMOVAL HAND Right 11/07/2015   Procedure: EXCISION OF VOLAR RETANICULUM RIGHT HAND;  Surgeon: Ninetta Lights, MD;  Location: [place];  Service: Orthopedics;  Laterality: Right;   SHOULDER ARTHROSCOPY Left 2002   SHOULDER ARTHROSCOPY WITH ROTATOR CUFF REPAIR AND SUBACROMIAL DECOMPRESSION  07/14/2012   Procedure: SHOULDER ARTHROSCOPY WITH ROTATOR CUFF REPAIR AND SUBACROMIAL DECOMPRESSION;  Surgeon: Ninetta Lights, MD;  Location: [place];  Service: Orthopedics;  Laterality: Right;  Right Shoulder Arthroscopy, Distal Claviculectomy, Subacromial Decompression, Partial Acromioplasty with Coracoacromial Release with Arthroscopic Rotator Cuff Repair    THYROID LOBECTOMY Right    Goiter s/p right thyroidectomy   TRIGGER FINGER RELEASE Right 11/07/2015   Procedure: RELEASE TRIGGER FINGER/A-1 PULLEY RIGHT RING FINGER ;   Surgeon: Ninetta Lights, MD;  Location: [place];  Service: Orthopedics;  Laterality: Right;   TUBAL LIGATION     Patient Active Problem List   Diagnosis Date Noted   Calcification of left breast 06/26/2019   Osteoarthritis 03/15/2019   Nodule of chest wall 06/21/2015   Trigger finger, acquired 10/23/2014   Right hip pain 06/01/2011   HLD (hyperlipidemia) 04/16/2009   Type 2 diabetes mellitus without complication, without long-term current use of insulin ([place]) 10/29/2008   DEGENERATIVE JOINT DISEASE, HIPS 10/29/2008   Hypothyroidism 08/12/2006   OBESITY, NOS 08/12/2006   Major depressive disorder, recurrent episode ([place]) 08/12/2006   HYPERTENSION, BENIGN SYSTEMIC 08/12/2006   DIVERTICULOSIS OF COLON 08/12/2006    REFERRING DIAG: Hamstring tightness bil LEs  THERAPY DIAG:  Pain in left leg  Other abnormalities of gait and mobility  Other low back pain  Pain in right leg  Rationale for Evaluation and Treatment Rehabilitation  PERTINENT HISTORY: Per chart review 09/10/21: Continued leg pain for 2 months. Worsening with recent hospitalization for pyelonephritis but has improved significantly since being discharge. Worse with climbing stairs and getting out of the car. She has been trying stretching exercises at home. She denies tingling, numbness. Denies hip and knee pain. Pain is located at the back of thighs bilaterally  PRECAUTIONS: None  SUBJECTIVE: Pt reports no pain currently, although  she reports some mild BIL posterior thigh pain earlier today. She reports readiness for discharge from PT at this time.  Pain: Are you having pain? Yes NPRS: 0/10 Pain Location: bilateral posterior hips and BIL knees Pain Frequency/Description: intermittent  Aggravating Factors: stairs Relieving Factors: rest  OBJECTIVE:    LOWER EXTREMITY ROM:   Passive ROM Right eval Left eval Right 01/13/2022 Left 01/13/2022  Hip flexion Limited to 90  degrees Limited to 90  degrees WNL WNL  Hip extension        Hip abduction limited limited WNL WNL, minor hip pain  Hip adduction limited limited WNL WNL  Hip internal rotation limited limited WNL WNL, minor hip pain  Hip external rotation Limited  limited WNL WNL  Knee flexion WNL WNL    Knee extension WNL WNL    Ankle dorsiflexion WNL WNL    Ankle plantarflexion        Ankle inversion        Ankle eversion         (Blank rows = not tested) Lumbar AROM standing: flexion limited 25%. Extension WNL All hip PROM limited all planes due to pain; pt very pain dominant bil LEs   LOWER EXTREMITY MMT:   MMT Right eval Left eval Right 12/03/2021 Left 12/03/2021 Rt 01/13/2022 Lt 01/13/2022  Hip flexion 4+/5 4/5 4+/5 4+/5 5/5 5/5  Hip extension          Hip abduction 4/5 4/5 5/5 4+/5 5/5 5/5  Hip adduction 4/5 4/5 5/5 5/5 5/5 5/5  Hip internal rotation          Hip external rotation          Knee flexion 4+/5 4/5 5/5 4/5 Lt hip pain 5/5 5/5  Knee extension 4+/5 4/5 5/5 5/5 5/5 5/5  Ankle dorsiflexion 4+/5 4/5 5/5 5/5    Ankle plantarflexion          Ankle inversion          Ankle eversion           (Blank rows = not tested)  12/31/2021: FOTO:  29%  01/13/2022: 44%   TODAY'S TREATMENT:  [facility] Adult PT Treatment:                                                DATE: 01/13/2022 Therapeutic Exercise: Marcello Moores stretch x67mn BIL Sidelying IT band stretch x263m BIL Seated active hamstring stretch x2m59mBIL Manual Therapy: Effleurage/ STM to BIL lumbar paraspinals x8 min Neuromuscular re-ed: N/A Therapeutic Activity: Re-assessment of objective measures with pt education Re-administration of FOTO with pt education Modalities: N/A Self Care: N/A  [facility] Adult PT Treatment:                                                DATE: 01/10/2022 Therapeutic Exercise: NuStep x5 minutes while collecting subjective information Seated active hamstring stretch  [id number] Side lunge with 15# kettlebell 2x10 [place]  with 5# kettlebell and contralateral UE support 2x10 BIL Dead bugs with alternating handhold resistance at thigh 2x20 Bridge with 15# kettlebell 2x10 with 5-sec hold DKTC stretch x2min91mnual Therapy: N/A Neuromuscular re-ed: N/A Therapeutic Activity: N/A Modalities: N/A Self Care: N/A  [facility]  PT [facility]:                                                DATE: 01/07/2022 Therapeutic Exercise: Seated active hamstring stretch x68mn BIL Straight arm reverse crunches 3x8 Standing hamstring curls with 10# ankle attachment at [employer] machine 2x10 BIL Squat with two 13# cables to waist attachment at FParker Hannifinmachine 3x10 Standing Pallof press with 10# cable 2x10 with 5-sec hold BIL Side knee plank 2x10 BIL Sidelying Lt IT band stretch x2 min Manual Therapy: N/A Neuromuscular re-ed: N/A Therapeutic Activity: N/A Modalities: N/A Self Care: N/A    PATIENT EDUCATION:  Education details: HEP issued Person educated: Patient Education method: Explanation Education comprehension: verbalized understanding     HOME EXERCISE PROGRAM:  [facility]: [facility] PaURL: [email or web address] Date: 11/19/2021 Prepared by: JRaeford Razor Exercises - Forward T with Counter Support  - 1 x daily - 7 x weekly - 2 sets - 10 reps - 5 hold - Kneeling Plank on Forearms with Scapular Protraction Retraction AROM  - 1 x daily - 7 x weekly - 1 sets - 5 reps - 10-30 sec hold - Prone Hip Extension with Pillow Under Abdomen  - 2 x daily - 7 x weekly - 2 sets - 10 reps - 5 hold - Quadruped Rocking Slow  - 2 x daily - 7 x weekly - 1 sets - 10 reps - 15 hold - Supine Bridge  - 2 x daily - 7 x weekly - 2 sets - 10 reps - 5 hold - Supine Hamstring Stretch with Strap  - 2 x daily - 7 x weekly - 1 sets - 5 reps - 30 hold - Supine Lower Trunk Rotation  - 2 x daily - 7 x weekly - 2 sets - 10 reps - 10 hold - Supine Piriformis Stretch with Foot on Ground  - 2 x daily - 7 x weekly - 1 sets - 5  reps - 30 hold - Supine Single Knee to Chest Stretch  - 2 x daily - 7 x weekly - 1 sets - 5 reps - 30 hold  Added 01/13/2022: - Sidelying ITB Stretch off Table  - 1 x daily - 7 x weekly - 2 minutes hold - Hip Flexor Stretch at Edge of Bed  - 1 x daily - 7 x weekly - 2 minute hold - Seated Hamstring Stretch  - 1 x daily - 7 x weekly - 2-min hold   ASSESSMENT:   CLINICAL IMPRESSION: Upon re-assessment of objective measures, the pt has met all of her functional rehab goals, making excellent progress in global BIL hip strength and AROM, as well as functional ability with sitting, standing, and walking. She also more than doubled her FOTO functional score. Due to these factors, the pt is discharged from PT at this time to perform her updated independent HEP.     OBJECTIVE IMPAIRMENTS Abnormal gait, decreased activity tolerance, decreased coordination, decreased mobility, difficulty walking, decreased ROM, decreased strength, hypomobility, increased muscle spasms, impaired flexibility, postural dysfunction, and pain.    ACTIVITY LIMITATIONS carrying, bending, sitting, standing, squatting, sleeping, stairs, transfers, bed mobility, hygiene/grooming, locomotion level, and caring for others   PARTICIPATION LIMITATIONS: meal prep, cleaning, laundry, driving, shopping, community activity, and occupation   PERSONAL FACTORS 1 comorbidity: OA  are also affecting patient's functional outcome.  GOALS: Goals reviewed with patient? Yes   SHORT TERM GOALS: Target date: 11/22/2021    Pt will be independent with initial HEP Baseline:not issued at eval due to time constraints 11/27/2021: Pt reports daily adherence to her HEP Goal status: ACHIEVED   2.  Pt will report ability to sit and watch TV with 25% less difficulty Baseline: unable > 10 min before change in position is needed 01/13/2022: No difficulty with sitting to watch TV Goal status: ACHIEVED     LONG TERM GOALS: Target date:  01/17/22      Pt will be able to go up the stairs without feeling like the left leg will give out >/=75% of the time Baseline: unable at eval 12/13/21 giving out 45% of the time 01/13/2022: Lt leg rarely gives out with stairs Goal status: ACHIEVED   2.  Pt will be able to walk with </=4/10 bil LE pain >/=15 min Baseline: 9/10 at eval 12/13/21: 7/10 12/03/2021: Pt reports being able to walk 10 minutes before needing a seated rest break due to pain 01/13/2022: 30 minutes before onset of pain Goal status: ACHIEVED   3.  Pt will be able to stand with </= 4/10 bil LE pain x 20 min without the need to sit down to wash dishes Baseline: 8/10 bil LE pain 12/03/2021 and 12/13/21: Pt reports being able to stand 7-10 minutes before needing to sit due to pain 01/13/2022: Pt reports ability to stand 20 minutes with no pain Goal status: ACHIEVED   4.  Pt will be able to sit >45 min with 75% less difficulty (esp at L ischial tuberosity) Baseline: 10 min; L glute more painful with sitting but both hurt 12/03/2021: 15 minutes 12/13/21: 30 min 01/13/2022: 1 hour with no difficulty Goal status: ACHIEVED   5.  Pt will increase bil LE strength >/=1/2 MMT grade to assist with improved functional mobility Baseline:  MMT Right eval Left eval  Hip flexion 4+/5 4/5  Hip extension      Hip abduction 4/5 4/5  Hip adduction 4/5 4/5  Hip internal rotation      Hip external rotation      Knee flexion 4+/5 4/5  Knee extension 4+/5 4/5  Ankle dorsiflexion 4+/5 4/5  Ankle plantarflexion      Ankle inversion      Ankle eversion        12/03/2021 and 12/13/21: See updated MMT chart 01/13/2022: See updated MMT chart Goal status: ACHIEVED    6.  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 IONTO MAY NOT BE COVERED BY [employer]-IF NOT, ONLY USE IF DISCUSS WITH PATIENT   PLAN FOR NEXT SESSION: Pt is discharged from PT at this time  PHYSICAL THERAPY DISCHARGE SUMMARY  Visits from Start of Care: 15  Current functional level related to goals / functional outcomes: Pt has met all of her functional rehab goals.   Remaining deficits: Low levels of pain with activity   Education / Equipment: HEP   Patient agrees to discharge. Patient goals were met. Patient is being discharged due to meeting  the stated rehab goals.    Vanessa Chilton, PT, DPT 01/13/22 7:08 PM

## 2022-01-17 ENCOUNTER — Ambulatory Visit: Payer: Medicare HMO

## 2022-02-05 ENCOUNTER — Ambulatory Visit: Payer: Self-pay

## 2022-02-05 NOTE — Patient Outreach (Signed)
  Care Coordination   Initial Visit Note   02/05/2022 Name: Candice Warner MRN: [id number] DOB: 06-03-1951  Candice Warner is a 71 y.o. year old female who sees Elberta Fortis, MD for primary care. I spoke with  Candice Warner by phone today  What matters to the patients health and wellness today?  "I completed my physical therapy but am still having pain in my muscles when I stand up in the morning and if I raise my arms".    Goals Addressed             This Visit's Progress    Care Coordination Activities-No follow up required       Care Coordination Interventions: Evaluation of current treatment plan related to patients pain and completion of physical therapy and patient's adherence to plan as established by provider.        SDOH assessments and interventions completed:  Yes  SDOH Interventions Today    Flowsheet Row Most Recent Value  SDOH Interventions   Housing Interventions Intervention Not Indicated  Transportation Interventions Intervention Not Indicated        Care Coordination Interventions Activated:  Yes  Care Coordination Interventions:  Yes, provided   Follow up plan: Referral made to to [facility] Admin to contact patient for follow up appointment    Encounter Outcome:  Pt. Visit Completed

## 2022-02-17 ENCOUNTER — Encounter: Payer: Self-pay | Admitting: Student

## 2022-02-17 ENCOUNTER — Ambulatory Visit (INDEPENDENT_AMBULATORY_CARE_PROVIDER_SITE_OTHER): Payer: Medicare HMO | Admitting: Student

## 2022-02-17 ENCOUNTER — Other Ambulatory Visit: Payer: Self-pay

## 2022-02-17 VITALS — BP 159/63 | HR 51 | Wt 183.0 lb

## 2022-02-17 DIAGNOSIS — M629 Disorder of muscle, unspecified: Secondary | ICD-10-CM

## 2022-02-17 DIAGNOSIS — I10 Essential (primary) hypertension: Secondary | ICD-10-CM | POA: Diagnosis not present

## 2022-02-17 NOTE — Patient Instructions (Addendum)
It was great to see you! Thank you for allowing me to participate in your care!  Our plans for today:  It seems like your hamstring muscles are tight. This will probably get better with Physical Therapy, also try getting massage to loosen/work the muscle.   - Salonpass patches on each leg (available over the counter in phramacy)   - Physical Therapy - Massage Therapy  Take care and seek immediate care sooner if you develop any concerns.   Dr. Bess Kinds, MD [facility]

## 2022-02-17 NOTE — Progress Notes (Signed)
  SUBJECTIVE:   CHIEF COMPLAINT / HPI:   Bilateral sciatica: Has tried gabapentin and PT, gabapentin not working. Was also given voltaren gel at last visit 10/30/21.    Hip/back pain is better after PT. But now has pain in hamstrings, worse when she reaches hands above head, has to lower them quickly. Pain described as a tooth ache, difficult to walk in the morning, usually taking a couple of step to get going. Pain rated 6-9/10. Pain worse in the morning, and get's better during the day. Same with getting out of cars. Has tried tylenol 650 x 2 with no relief. Voltaren gel and gabapentin didn't work. Appreciates weakness in right knee and sometimes it gives out, hasn't fallen. Happens about twice a week. No issues with urination or bathroom habits.    PERTINENT  PMH / PSH: DJD of hips, OA, obesity   OBJECTIVE:  BP (!) 159/63   Pulse (!) 51   Wt 183 lb (83 kg)   SpO2 100%   BMI 29.54 kg/m   General: NAD, pleasant, able to participate in exam Physical Exam Musculoskeletal:     Right hip: Normal. No tenderness. Normal range of motion.     Left hip: Normal. No tenderness. Normal range of motion.     Right upper leg: Tenderness (hamstring) present. No swelling.     Left upper leg: Tenderness (hamstring) present. No swelling.     Right knee: Normal.     Left knee: Normal.     Comments: ROM decreased bilaterally with flexion of LE 2/2 to pain/tightness.       ASSESSMENT/PLAN:  Hamstring tightness of both lower extremities Patient complaining of tightness and pain in hamstrings. Patient receiving PT for hip/back pain, which have improved, but she is now appreciating issues with her hamstrings. Hamstrings TTP, with decreased ROM w/ flexion at knee/of hamstrings. Patient appears to have tight hamstring muscles that would benefit from further PT. Will encourage topical pain control. -PT -Salonpass -Massage Tx   HYPERTENSION, BENIGN SYSTEMIC BP elevated today to 159/63. Patient taking  her meds. Patient to f/u in 1 week for BP check. Consider adjusting antihypertensive regimen if continues to be up. -Continue lisinopril   Orders Placed This Encounter  Procedures   Ambulatory referral to Physical Therapy    Referral Priority:   Routine    Referral Type:   Physical Medicine    Referral Reason:   Specialty Services Required    Requested Specialty:   Physical Therapy    Number of Visits Requested:   1   No orders of the defined types were placed in this encounter.  No follow-ups on file. @SIGNNOTE @

## 2022-02-25 DIAGNOSIS — M629 Disorder of muscle, unspecified: Secondary | ICD-10-CM | POA: Insufficient documentation

## 2022-02-25 DIAGNOSIS — M79659 Pain in unspecified thigh: Secondary | ICD-10-CM | POA: Insufficient documentation

## 2022-02-25 NOTE — Assessment & Plan Note (Signed)
Patient complaining of tightness and pain in hamstrings. Patient receiving PT for hip/back pain, which have improved, but she is now appreciating issues with her hamstrings. Hamstrings TTP, with decreased ROM w/ flexion at knee/of hamstrings. Patient appears to have tight hamstring muscles that would benefit from further PT. Will encourage topical pain control. -PT -Salonpass -Massage Tx

## 2022-02-25 NOTE — Assessment & Plan Note (Signed)
BP elevated today to 159/63. Patient taking her meds. Patient to f/u in 1 week for BP check. Consider adjusting antihypertensive regimen if continues to be up. -Continue lisinopril

## 2022-02-25 NOTE — Assessment & Plan Note (Signed)
>>  ASSESSMENT AND PLAN FOR HAMSTRING TIGHTNESS OF BOTH LOWER EXTREMITIES WRITTEN ON 02/25/2022  3:28 AM BY Bess Kinds, MD  Patient complaining of tightness and pain in hamstrings. Patient receiving PT for hip/back pain, which have improved, but she is now appreciating issues with her hamstrings. Hamstrings TTP, with decreased ROM w/ flexion at knee/of hamstrings. Patient appears to have tight hamstring muscles that would benefit from further PT. Will encourage topical pain control. -PT -Salonpass -Massage Tx

## 2022-03-04 ENCOUNTER — Ambulatory Visit: Payer: Medicare HMO | Admitting: Student

## 2022-03-10 ENCOUNTER — Encounter: Payer: Self-pay | Admitting: Family Medicine

## 2022-03-10 ENCOUNTER — Ambulatory Visit (INDEPENDENT_AMBULATORY_CARE_PROVIDER_SITE_OTHER): Payer: Medicare HMO | Admitting: Family Medicine

## 2022-03-10 VITALS — BP 190/75 | HR 57 | Ht 66.0 in | Wt 184.4 lb

## 2022-03-10 DIAGNOSIS — M79652 Pain in left thigh: Secondary | ICD-10-CM | POA: Diagnosis not present

## 2022-03-10 DIAGNOSIS — M65341 Trigger finger, right ring finger: Secondary | ICD-10-CM

## 2022-03-10 DIAGNOSIS — Z23 Encounter for immunization: Secondary | ICD-10-CM | POA: Diagnosis not present

## 2022-03-10 DIAGNOSIS — I10 Essential (primary) hypertension: Secondary | ICD-10-CM | POA: Diagnosis not present

## 2022-03-10 DIAGNOSIS — M79651 Pain in right thigh: Secondary | ICD-10-CM | POA: Diagnosis not present

## 2022-03-10 DIAGNOSIS — M79659 Pain in unspecified thigh: Secondary | ICD-10-CM | POA: Insufficient documentation

## 2022-03-10 MED ORDER — LISINOPRIL 20 MG PO TABS: 20.0000 mg | ORAL_TABLET | Freq: Every day | ORAL | 0 refills | Status: DC

## 2022-03-10 NOTE — Progress Notes (Signed)
    SUBJECTIVE:   CHIEF COMPLAINT / HPI:   HTN:  She is here for a follow-up. Compliant with Lisinopril 10 mg QD. Her home BP check with the lowest of  154/68 and the highest of 198/80. She denies headache, SOB, and chest pain. Feels well otherwise.   Finger issue: C/O her right ring finger getting stuck in a flexed position intermittently. She had a similar presentation many years ago, for which she had surgery with some improvement. She denies pain or swelling. Her symptoms improve with massaging.   Thigh pain: C/O chronic B/L thigh pain worsens when she wakes in the morning. Gabapentin was ineffective, so she d/ced it. She recently used her friend's diclofenac pills with great improvement. She requested to be started on this med.  PERTINENT  PMH / PSH: PMHx reviewed  OBJECTIVE:   Vitals:   03/10/22 0957 03/10/22 1011  BP: (!) 204/78 (!) 190/75  Pulse: (!) 57   SpO2: 100%   Weight: 184 lb 6.4 oz (83.6 kg)   Height: 5\' 6"  (1.676 m)     Physical Exam Vitals reviewed.  Cardiovascular:     Rate and Rhythm: Normal rate and regular rhythm.     Heart sounds: Normal heart sounds. No murmur heard.    No friction rub.  Musculoskeletal:     Right hand: Normal.     Left hand: Normal.     Cervical back: Neck supple.     Comments: Good ROM of her LL  Neurological:     General: No focal deficit present.     Mental Status: She is oriented to person, place, and time.      ASSESSMENT/PLAN:   HYPERTENSION, BENIGN SYSTEMIC Uncontrolled BP. Lisinopril increased to 20  Mg QD. Monitor kidney functions closely. Consider recheck at next visit.  F/U in 4 weeks for reassessment.   Right trigger finger Currently asymptomatic. Tylenol prn recommended with messaging. Consider hand specialist in the future if worsened. She agreed with the plan.   Thigh pain Likely MSK. Given her mild kidney impairment and on ACEi, I recommended Tylenol prn instead. F/U soon with PCP for  reassessment. She agreed with the plan.    Flu shot given during this visit.   Andrena Mews, MD [place]

## 2022-03-10 NOTE — Patient Instructions (Addendum)
It was nice meeting you today. Your BP remains elevated. Please, go up on your Lisinopril from 10 mg to 20 mg daily. You can take two tablets of your 10 mg to make 20 mg per day. Monitor your BP at home closely. Your expected BP range is between 120/70 to 140/90. We will like to see you again in 4 weeks for medication management. Trigger Finger  Trigger finger, also called stenosing tenosynovitis,  is a condition that causes a finger to get stuck in a bent position. Each finger has a tendon, which is a tough, cord-like tissue that connects muscle to bone, and each tendon passes through a tunnel of tissue called a tendon sheath. To move your finger, your tendon needs to glide freely through the sheath. Trigger finger happens when the tendon or the sheath thickens, making it difficult to move your finger. Trigger finger can affect any finger or a thumb. It may affect more than one finger. Mild cases may clear up with rest and medicine. Severe cases require more treatment. What are the causes? Trigger finger is caused by a thickened finger tendon or tendon sheath. The cause of this thickening is not known. What increases the risk? The following factors may make you more likely to develop this condition: Doing activities that require a strong grip. Having rheumatoid arthritis, gout, or diabetes. Being 52-41 years old. Being female. What are the signs or symptoms? Symptoms of this condition include: Pain when bending or straightening your finger. Tenderness or swelling where your finger attaches to the palm of your hand. A lump in the palm of your hand or on the inside of your finger. Hearing a noise like a pop or a snap when you try to straighten your finger. Feeling a catching or locking sensation when you try to straighten your finger. Being unable to straighten your finger. How is this diagnosed? This condition is diagnosed based on your symptoms and a physical exam. How is this  treated? This condition may be treated by: Resting your finger and avoiding activities that make symptoms worse. Wearing a finger splint to keep your finger extended. Taking NSAIDs, such as ibuprofen, to relieve pain and swelling. Doing gentle exercises to stretch the finger as told by your health care provider. Having medicine that reduces swelling and inflammation (steroids) injected into the tendon sheath. Injections may need to be repeated. Having surgery to open the tendon sheath. This may be done if other treatments do not work and you cannot straighten your finger. You may need physical therapy after surgery. Follow these instructions at home: If you have a splint: Wear the splint as told by your health care provider. Remove it only as told by your health care provider. Loosen it if your fingers tingle, become numb, or turn cold and blue. Keep it clean. If the splint is not waterproof: Do not let it get wet. Cover it with a watertight covering when you take a bath or shower. Managing pain, stiffness, and swelling     If directed, apply heat to the affected area as often as told by your health care provider. Use the heat source that your health care provider recommends, such as a moist heat pack or a heating pad. Place a towel between your skin and the heat source. Leave the heat on for 20-30 minutes. Remove the heat if your skin turns bright red. This is especially important if you are unable to feel pain, heat, or cold. You may have a greater  risk of getting burned. If directed, put ice on the painful area. To do this: If you have a removable splint, remove it as told by your health care provider. Put ice in a plastic bag. Place a towel between your skin and the bag or between your splint and the bag. Leave the ice on for 20 minutes, 2-3 times a day.  Activity Rest your finger as told by your health care provider. Avoid activities that make the pain worse. Return to your  normal activities as told by your health care provider. Ask your health care provider what activities are safe for you. Do exercises as told by your health care provider. Ask your health care provider when it is safe to drive if you have a splint on your hand. General instructions Take over-the-counter and prescription medicines only as told by your health care provider. Keep all follow-up visits as told by your health care provider. This is important. Contact a health care provider if: Your symptoms are not improving with home care. Summary Trigger finger, also called stenosing tenosynovitis, causes your finger to get stuck in a bent position. This can make it difficult and painful to straighten your finger. This condition develops when a finger tendon or tendon sheath thickens. Treatment may include resting your finger, wearing a splint, and taking medicines. In severe cases, surgery to open the tendon sheath may be needed. This information is not intended to replace advice given to you by your health care provider. Make sure you discuss any questions you have with your health care provider. Document Revised: 10/17/2018 Document Reviewed: 10/17/2018 Elsevier Patient Education  [place].

## 2022-03-10 NOTE — Assessment & Plan Note (Addendum)
Uncontrolled BP. Lisinopril increased to 20  Mg QD. Monitor kidney functions closely. Consider recheck at next visit.  F/U in 4 weeks for reassessment.

## 2022-03-10 NOTE — Assessment & Plan Note (Signed)
Currently asymptomatic. Tylenol prn recommended with messaging. Consider hand specialist in the future if worsened. She agreed with the plan.

## 2022-03-10 NOTE — Assessment & Plan Note (Signed)
Likely MSK. Given her mild kidney impairment and on ACEi, I recommended Tylenol prn instead. F/U soon with PCP for reassessment. She agreed with the plan.

## 2022-03-18 ENCOUNTER — Other Ambulatory Visit: Payer: Self-pay | Admitting: Family Medicine

## 2022-03-18 DIAGNOSIS — H109 Unspecified conjunctivitis: Secondary | ICD-10-CM

## 2022-03-22 ENCOUNTER — Other Ambulatory Visit: Payer: Self-pay | Admitting: Family Medicine

## 2022-03-22 DIAGNOSIS — E039 Hypothyroidism, unspecified: Secondary | ICD-10-CM

## 2022-03-22 DIAGNOSIS — E119 Type 2 diabetes mellitus without complications: Secondary | ICD-10-CM

## 2022-04-07 ENCOUNTER — Other Ambulatory Visit: Payer: Self-pay | Admitting: Family Medicine

## 2022-04-07 DIAGNOSIS — Z1231 Encounter for screening mammogram for malignant neoplasm of breast: Secondary | ICD-10-CM

## 2022-04-08 ENCOUNTER — Ambulatory Visit
Admission: RE | Admit: 2022-04-08 | Discharge: 2022-04-08 | Disposition: A | Discharge status: Home / Self Care | Payer: Medicare HMO | Source: Ambulatory Visit | Attending: Family Medicine | Admitting: Family Medicine

## 2022-04-08 DIAGNOSIS — Z1231 Encounter for screening mammogram for malignant neoplasm of breast: Secondary | ICD-10-CM

## 2022-04-10 ENCOUNTER — Ambulatory Visit (INDEPENDENT_AMBULATORY_CARE_PROVIDER_SITE_OTHER): Payer: Medicare HMO | Admitting: Family Medicine

## 2022-04-10 ENCOUNTER — Encounter: Payer: Self-pay | Admitting: Family Medicine

## 2022-04-10 VITALS — BP 188/56 | HR 60 | Ht 66.0 in | Wt 186.0 lb

## 2022-04-10 DIAGNOSIS — I10 Essential (primary) hypertension: Secondary | ICD-10-CM | POA: Diagnosis not present

## 2022-04-10 DIAGNOSIS — E119 Type 2 diabetes mellitus without complications: Secondary | ICD-10-CM | POA: Diagnosis not present

## 2022-04-10 LAB — POCT GLYCOSYLATED HEMOGLOBIN (HGB A1C): HbA1c, POC (controlled diabetic range): 6.3 % (ref 0.0–7.0)

## 2022-04-10 MED ORDER — METFORMIN HCL ER 500 MG PO TB24: 1000.0000 mg | ORAL_TABLET | Freq: Two times a day (BID) | ORAL | 0 refills | Status: DC

## 2022-04-10 MED ORDER — AMLODIPINE BESYLATE 5 MG PO TABS: 5.0000 mg | ORAL_TABLET | Freq: Every day | ORAL | 1 refills | Status: DC

## 2022-04-10 NOTE — Progress Notes (Signed)
    SUBJECTIVE:   CHIEF COMPLAINT / HPI:   HPI:  HTN:  She is here for a follow-up. She is compliant with her new dose of Lisinopril 20 mg QD. Her home BP ranges from 130/50s to 1802/70. She denies any cardiopulm or neurologic symptoms.   DM:  She stated that her PCP recently restarted her on Metformin 500 mg two tabs BID. She has been compliant with this regimen and is tolerating it well. She requested a refill of this medication.   PERTINENT  PMH / PSH: PMHX reviewed  OBJECTIVE:   Vitals:   04/10/22 0924 04/10/22 0934 04/10/22 0935  BP: (!) 175/58 (!) 189/61 (!) 188/56  Pulse: 60    SpO2: 100%    Weight: 186 lb (84.4 kg)    Height: 5\' 6"  (1.676 m)      Physical Exam Vitals and nursing note reviewed.  Cardiovascular:     Rate and Rhythm: Normal rate and regular rhythm.     Heart sounds: Normal heart sounds. No murmur heard. Pulmonary:     Effort: No respiratory distress.     Breath sounds: Normal breath sounds. No wheezing.  Musculoskeletal:     Right lower leg: No edema.     Left lower leg: No edema.      ASSESSMENT/PLAN:   HYPERTENSION, BENIGN SYSTEMIC BP uncontrolled on Lisinopril 20 mg QD. She had been on Norvasc in the past. I added on Norvasc 5 mg QD. FLP and Bmet checked. I will call with her results.   Type 2 diabetes mellitus without complication, without long-term current use of insulin ([place]) I called after visit to discuss her A1C result which is below goal. She again affirm that she has been on Metformin 500 mg 2 tab BID, even though it is not on file. I escribed med. F/U in 3 months for A1C May need a reduced dose of Metformin if her A1C remains this low. She agreed with the plan.      Andrena Mews, MD [place]

## 2022-04-10 NOTE — Assessment & Plan Note (Signed)
BP uncontrolled on Lisinopril 20 mg QD. She had been on Norvasc in the past. I added on Norvasc 5 mg QD. FLP and Bmet checked. I will call with her results.

## 2022-04-10 NOTE — Assessment & Plan Note (Signed)
I called after visit to discuss her A1C result which is below goal. She again affirm that she has been on Metformin 500 mg 2 tab BID, even though it is not on file. I escribed med. F/U in 3 months for A1C May need a reduced dose of Metformin if her A1C remains this low. She agreed with the plan.

## 2022-04-10 NOTE — Patient Instructions (Addendum)
  It was nice seeing you today. Your BP is still high.  Continue Lisinopril 20 mg daily. We have added on Amlodipine 5 mg daily. I will get some blood test done today and call you with the result. I will also call you later today about your A1C results. Please, see PCP in 4 weeks for BP management. In the meantime, continue to check BP closely at home.

## 2022-04-11 LAB — BASIC METABOLIC PANEL
BUN/Creatinine Ratio: 17 (ref 12–28)
BUN: 18 mg/dL (ref 8–27)
CO2: 24 mmol/L (ref 20–29)
Calcium: 9.4 mg/dL (ref 8.7–10.3)
Chloride: 105 mmol/L (ref 96–106)
Creatinine, Ser: 1.05 mg/dL — ABNORMAL HIGH (ref 0.57–1.00)
Glucose: 136 mg/dL — ABNORMAL HIGH (ref 70–99)
Potassium: 4.4 mmol/L (ref 3.5–5.2)
Sodium: 143 mmol/L (ref 134–144)
eGFR: 56 mL/min/{1.73_m2} — ABNORMAL LOW (ref >59)

## 2022-04-11 LAB — LIPID PANEL
Chol/HDL Ratio: 1.9 ratio (ref 0.0–4.4)
Cholesterol, Total: 108 mg/dL (ref 100–199)
HDL: 58 mg/dL (ref >39)
LDL Chol Calc (NIH): 34 mg/dL (ref 0–99)
Triglycerides: 79 mg/dL (ref 0–149)
VLDL Cholesterol Cal: 16 mg/dL (ref 5–40)

## 2022-04-13 ENCOUNTER — Telehealth: Payer: Self-pay | Admitting: Family Medicine

## 2022-04-13 ENCOUNTER — Other Ambulatory Visit: Payer: Self-pay | Admitting: Family Medicine

## 2022-04-13 ENCOUNTER — Encounter: Payer: Self-pay | Admitting: Family Medicine

## 2022-04-13 DIAGNOSIS — H109 Unspecified conjunctivitis: Secondary | ICD-10-CM

## 2022-04-13 DIAGNOSIS — E039 Hypothyroidism, unspecified: Secondary | ICD-10-CM

## 2022-04-13 NOTE — Telephone Encounter (Signed)
Test result discussed. Normal FLP. Kidney function improved. Monitor closely with PCP. She mentioned itchy hand for which she has been using topical and oral benadryl with no improvement. I advised she schedules an appointment to be seen for this. She agreed with the plan.

## 2022-04-21 ENCOUNTER — Other Ambulatory Visit: Payer: Self-pay | Admitting: Family Medicine

## 2022-05-06 ENCOUNTER — Other Ambulatory Visit: Payer: Self-pay | Admitting: Family Medicine

## 2022-05-14 ENCOUNTER — Ambulatory Visit (INDEPENDENT_AMBULATORY_CARE_PROVIDER_SITE_OTHER): Payer: Medicare HMO | Admitting: Family Medicine

## 2022-05-14 ENCOUNTER — Encounter: Payer: Self-pay | Admitting: Family Medicine

## 2022-05-14 VITALS — BP 156/76 | HR 60 | Ht 66.0 in | Wt 186.6 lb

## 2022-05-14 DIAGNOSIS — M545 Low back pain, unspecified: Secondary | ICD-10-CM

## 2022-05-14 DIAGNOSIS — M65342 Trigger finger, left ring finger: Secondary | ICD-10-CM | POA: Diagnosis not present

## 2022-05-14 DIAGNOSIS — M629 Disorder of muscle, unspecified: Secondary | ICD-10-CM | POA: Diagnosis not present

## 2022-05-14 DIAGNOSIS — M79651 Pain in right thigh: Secondary | ICD-10-CM | POA: Diagnosis not present

## 2022-05-14 DIAGNOSIS — I10 Essential (primary) hypertension: Secondary | ICD-10-CM

## 2022-05-14 DIAGNOSIS — Z23 Encounter for immunization: Secondary | ICD-10-CM | POA: Diagnosis not present

## 2022-05-14 DIAGNOSIS — M79652 Pain in left thigh: Secondary | ICD-10-CM

## 2022-05-14 DIAGNOSIS — M25552 Pain in left hip: Secondary | ICD-10-CM

## 2022-05-14 DIAGNOSIS — M25551 Pain in right hip: Secondary | ICD-10-CM | POA: Diagnosis not present

## 2022-05-14 MED ORDER — AMLODIPINE BESYLATE 10 MG PO TABS: 10.0000 mg | ORAL_TABLET | Freq: Every day | ORAL | 3 refills | Status: DC

## 2022-05-14 NOTE — Assessment & Plan Note (Addendum)
Persistent pain not improved by physical therapy, massage, Gabapentin or Voltaren gel. Likely referred from hip or low back. Ordered XR hips bilaterally and lumbar spine. Increase to Tylenol 650mg  TID for pain control. Consider referral to orthopedics for hip replacement pending XR.

## 2022-05-14 NOTE — Assessment & Plan Note (Addendum)
L ring finger catching and stuck in full flexion multiple times per day. Previously saw Dr. Eulah Pont (now retired) who provided surgical relief. Would like to be referred back to discuss treatment options. Referral to [facility] placed.

## 2022-05-14 NOTE — Patient Instructions (Signed)
It was wonderful to see you today.  Please bring ALL of your medications with you to every visit.   Today we talked about:  I am ordering x-rays of your low back and hips. I will follow up with you about these results. We are increasing your Amlodipine to 10mg  at bedtime. If you have a supply of 5mg  tablets you can take two until they run out. I sent in a new prescription for 10mg  tablets I put in a referral to see Dr. about your finger issue. We will call you with that information.  Please follow up in 1 month for BP check.  Thank you for choosing [facility].   Please call [phone] with any questions about today's appointment.  Please be sure to schedule follow up at the front desk before you leave today.   Eulah Pont, DO Family Medicine    Blood Pressure Record Sheet To take your blood pressure, you will need a blood pressure machine. You can buy a blood pressure machine (blood pressure monitor) at your clinic, drug store, or online. When choosing one, consider: An automatic monitor that has an arm cuff. A cuff that wraps snugly around your upper arm. You should be able to fit only one finger between your arm and the cuff. A device that stores blood pressure reading results. Do not choose a monitor that measures your blood pressure from your wrist or finger. Follow your health care provider's instructions for how to take your blood pressure. To use this form: Take your blood pressure medications every day These measurements should be taken when you have been at rest for at least 10-15 min Take at least 2 readings with each blood pressure check. This makes sure the results are correct. Wait 1-2 minutes between measurements. Write down the results in the spaces on this form. Keep in mind it should always be recorded systolic over diastolic. Both numbers are important.  Repeat this every day for 2-3 weeks, or as told by your health care  provider.  Make a follow-up appointment with your health care provider to discuss the results.  Blood Pressure Log Date Medications taken? (Y/N) Blood Pressure Time of Day

## 2022-05-14 NOTE — Assessment & Plan Note (Addendum)
156/76 today and home log consistently with systolic in 150s, not at goal. Increase to Amlodipine 10mg  and continue Lisinopril 20mg . Tolerating medications well without side effects.

## 2022-05-14 NOTE — Assessment & Plan Note (Signed)
Persistent pain not improved by physical therapy, massage, Gabapentin or Voltaren gel. Likely referred from hip or low back. Ordered XR hips bilaterally and lumbar spine. Increase to Tylenol 650mg TID for pain control. Consider referral to orthopedics for hip replacement pending XR. 

## 2022-05-14 NOTE — Progress Notes (Signed)
    SUBJECTIVE:   CHIEF COMPLAINT / HPI:   Hypertension: - Medications: Amlodipine 5mg , Lisinopril 20mg  - Compliance: Yes - Checking BP at home: Yes, systolic 150-130s. Diastolic in 70-60s. - Denies any SOB, CP, vision changes, LE edema, medication SEs, or symptoms of hypotension - Diet: Low salt. Family member work on EMS and has been cooking healthy meals for her. - Exercise: Walks up and down stairs caring for a baby. Otherwise walking limited by pain in legs.  Hip pain: Patient continued to have pain from posterior gluteal region to just above knee bilaterally. Previously tried physical therapy and massage that did not help. Gabapentin and volatren gel did not work. Currently taking Tylenol 650mg  BID with a little relief. Daughter takes Oxycodone for pain and she was wondering if this would help. Denies bladder and bowel incontinence and saddle paraesthesia.   L finger issue: Patient presents with L ring finger catching and getting stuck down to where she has to physical move her finger to release it. States she consistently massages the finger and moves it around to help. H/o trigger finger, previously seen by Dr. (now retired) at 09-20-2003 who did surgery that helped. Interested in going back to see what other options are available.  PERTINENT  PMH / PSH: HTN, DM  OBJECTIVE:   BP (!) 156/76   Pulse 60   Ht 5\' 6"  (1.676 m)   Wt 186 lb 9.6 oz (84.6 kg)   SpO2 99%   BMI 30.12 kg/m    General: NAD, pleasant, able to participate in exam Cardiac: RRR, no murmurs. Respiratory: CTAB, normal effort, No wheezes, rales or rhonchi Abdomen: Bowel sounds present, nontender, nondistended Extremities: no edema or cyanosis. Skin: warm and dry, no rashes noted Neuro: alert, no obvious focal deficits Psych: Normal affect and mood MSK: L ring finger catching when moving from full flexion to extension. Non-tender to palpation over spinous process and musculature of lumbar spine.  Lumbar paraspinal hypertonicity bilaterally. Negative SLR bilaterally.  ASSESSMENT/PLAN:   Thigh pain Persistent pain not improved by physical therapy, massage, Gabapentin or Voltaren gel. Likely referred from hip or low back. Ordered XR hips bilaterally and lumbar spine. Increase to Tylenol 650mg  TID for pain control. Consider referral to orthopedics for hip replacement pending XR.  HYPERTENSION, BENIGN SYSTEMIC 156/76 today and home log consistently with systolic in 150s, not at goal. Increase to Amlodipine 10mg  and continue Lisinopril 20mg . Tolerating medications well without side effects.  Left trigger finger L ring finger catching and stuck in full flexion multiple times per day. Previously saw Dr. (now retired) who provided surgical relief. Would like to be referred back to discuss treatment options. Referral to [facility] placed.     Dr. [employer], DO [facility]

## 2022-05-19 ENCOUNTER — Other Ambulatory Visit: Payer: Self-pay | Admitting: Family Medicine

## 2022-05-19 ENCOUNTER — Ambulatory Visit
Admission: RE | Admit: 2022-05-19 | Discharge: 2022-05-19 | Disposition: A | Discharge status: Home / Self Care | Payer: Medicare HMO | Source: Ambulatory Visit | Attending: Family Medicine | Admitting: Family Medicine

## 2022-05-19 DIAGNOSIS — M25552 Pain in left hip: Secondary | ICD-10-CM | POA: Diagnosis not present

## 2022-05-19 DIAGNOSIS — M629 Disorder of muscle, unspecified: Secondary | ICD-10-CM

## 2022-05-19 DIAGNOSIS — M79651 Pain in right thigh: Secondary | ICD-10-CM

## 2022-05-19 DIAGNOSIS — M25551 Pain in right hip: Secondary | ICD-10-CM

## 2022-05-19 DIAGNOSIS — M65342 Trigger finger, left ring finger: Secondary | ICD-10-CM

## 2022-05-19 DIAGNOSIS — M545 Low back pain, unspecified: Secondary | ICD-10-CM | POA: Diagnosis not present

## 2022-05-19 DIAGNOSIS — Z23 Encounter for immunization: Secondary | ICD-10-CM

## 2022-05-19 DIAGNOSIS — I10 Essential (primary) hypertension: Secondary | ICD-10-CM

## 2022-05-21 ENCOUNTER — Other Ambulatory Visit: Payer: Self-pay | Admitting: Family Medicine

## 2022-05-28 ENCOUNTER — Telehealth: Payer: Self-pay | Admitting: Family Medicine

## 2022-05-28 DIAGNOSIS — M79651 Pain in right thigh: Secondary | ICD-10-CM

## 2022-05-28 NOTE — Telephone Encounter (Signed)
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 Patient with persistent symptoms not relieved by Tylenol, Gabapentin, Voltaren gel, PT or massage. In discussion with Dr. Perley Jain, will refer patient to PM&R for further evaluation. Patient agreeable. Advised to continue Tylenol 650mg  TID prn and PT stretching in the interim time. Return to clinic sooner if symptoms worsening.  , DO

## 2022-06-17 ENCOUNTER — Encounter: Payer: Self-pay | Admitting: Physical Medicine and Rehabilitation

## 2022-06-25 ENCOUNTER — Other Ambulatory Visit: Payer: Self-pay | Admitting: Family Medicine

## 2022-06-26 ENCOUNTER — Other Ambulatory Visit: Payer: Self-pay | Admitting: *Deleted

## 2022-06-26 MED ORDER — LISINOPRIL 20 MG PO TABS: 20.0000 mg | ORAL_TABLET | Freq: Every day | ORAL | 1 refills | Status: DC

## 2022-07-09 ENCOUNTER — Ambulatory Visit: Payer: Self-pay | Admitting: Family Medicine

## 2022-07-15 ENCOUNTER — Ambulatory Visit (INDEPENDENT_AMBULATORY_CARE_PROVIDER_SITE_OTHER): Payer: Medicare HMO

## 2022-07-15 VITALS — Ht 66.0 in | Wt 186.0 lb

## 2022-07-15 DIAGNOSIS — Z0000 Encounter for general adult medical examination without abnormal findings: Secondary | ICD-10-CM

## 2022-07-15 NOTE — Progress Notes (Signed)
Virtual Visit via Telephone Note  I connected with  Candice Warner on 07/15/22 at  2:30 PM EST by telephone and verified that I am speaking with the correct person using two identifiers.  Location: Patient: home Provider: [facility] Persons participating in the virtual visit: [place]   I discussed the limitations, risks, security and privacy concerns of performing an evaluation and management service by telephone and the availability of in person appointments. The patient expressed understanding and agreed to proceed.  Interactive audio and video telecommunications were attempted between this nurse and patient, however failed, due to patient having technical difficulties OR patient did not have access to video capability.  We continued and completed visit with audio only.  Some vital signs may be absent or patient reported.   Roger Shelter, LPN  Subjective:   Candice Warner is a 72 y.o. female who presents for an Initial Medicare Annual Wellness Visit.  Review of Systems         Objective:    Today's Vitals   07/15/22 1432 07/15/22 1435  Weight: 186 lb (84.4 kg)   Height: 5\' 6"  (1.676 m)   PainSc:  8    Body mass index is 30.02 kg/m.     07/15/2022    2:46 PM 04/10/2022    9:24 AM 03/10/2022    9:57 AM 11/01/2021    9:55 AM 10/30/2021    3:39 PM 10/07/2021    4:24 PM 09/10/2021    3:54 PM  Advanced Directives  Does Patient Have a Medical Advance Directive? No;Yes No No No No No No  Type of Academic librarian;Living will        Does patient want to make changes to medical advance directive? Yes (Inpatient - patient defers changing a medical advance directive and declines information at this time)        Copy of [place] in Chart? Yes - validated most recent copy scanned in chart (See row information)        Would patient like information on creating a medical advance directive? Yes (Inpatient - patient  defers creating a medical advance directive and declines information at this time) No - Patient declined No - Patient declined Yes (MAU/Ambulatory/Procedural Areas - Information given) No - Patient declined No - Patient declined     Current Medications (verified) Outpatient Encounter Medications as of 07/15/2022  Medication Sig   amLODipine (NORVASC) 10 MG tablet Take 1 tablet (10 mg total) by mouth at bedtime.   atorvastatin (LIPITOR) 40 MG tablet TAKE 1 TABLET BY MOUTH EVERY DAY   Blood Glucose Monitoring Suppl W/DEVICE KIT Test blood sugars every morning prior to eating or drinking.   glucose 4 GM chewable tablet Chew 1 tablet (4 g total) by mouth as needed for low blood sugar.   glucose blood (ONETOUCH VERIO) test strip Use as instructed   Lancets (ONETOUCH ULTRASOFT) lancets Use as instructed   levothyroxine (SYNTHROID) 50 MCG tablet TAKE 1 TABLET BY MOUTH EVERY DAY   lisinopril (ZESTRIL) 20 MG tablet Take 1 tablet (20 mg total) by mouth daily.   metFORMIN (GLUCOPHAGE-XR) 500 MG 24 hr tablet TAKE 2 TABLETS (1,000 MG TOTAL) BY MOUTH 2 (TWO) TIMES DAILY WITH A MEAL.   sertraline (ZOLOFT) 100 MG tablet Take 0.5 tablets (50 mg total) by mouth daily.   No facility-administered encounter medications on file as of 07/15/2022.    Allergies (verified) Patient has no known allergies.  History: Past Medical History:  Diagnosis Date   Arthritis    "right hip" (06/27/2018)   Depression    Hyperlipidemia    Hypertension    Hypokalemia    Hypothyroidism    Lumbar back pain    Obesity    Radiculopathy    Thyroid disease    Type II diabetes mellitus ([place])    Past Surgical History:  Procedure Laterality Date   CYST REMOVAL HAND Right 11/07/2015   Procedure: EXCISION OF VOLAR RETANICULUM RIGHT HAND;  Surgeon: Ninetta Lights, MD;  Location: [facility];  Service: Orthopedics;  Laterality: Right;   SHOULDER ARTHROSCOPY Left 2002   SHOULDER ARTHROSCOPY WITH ROTATOR CUFF REPAIR  AND SUBACROMIAL DECOMPRESSION  07/14/2012   Procedure: SHOULDER ARTHROSCOPY WITH ROTATOR CUFF REPAIR AND SUBACROMIAL DECOMPRESSION;  Surgeon: Ninetta Lights, MD;  Location: [facility];  Service: Orthopedics;  Laterality: Right;  Right Shoulder Arthroscopy, Distal Claviculectomy, Subacromial Decompression, Partial Acromioplasty with Coracoacromial Release with Arthroscopic Rotator Cuff Repair    THYROID LOBECTOMY Right    Goiter s/p right thyroidectomy   TRIGGER FINGER RELEASE Right 11/07/2015   Procedure: RELEASE TRIGGER FINGER/A-1 PULLEY RIGHT RING FINGER ;  Surgeon: Ninetta Lights, MD;  Location: [facility];  Service: Orthopedics;  Laterality: Right;   TUBAL LIGATION     Family History  Problem Relation Age of Onset   Heart disease Mother    Heart disease Father    Breast cancer Maternal Aunt    Social History   Socioeconomic History   Marital status: Widowed    Spouse name: Not on file   Number of children: Not on file   Years of education: Not on file   Highest education level: Not on file  Occupational History   Not on file  Tobacco Use   Smoking status: Never   Smokeless tobacco: Never  Vaping Use   Vaping Use: Never used  Substance and Sexual Activity   Alcohol use: Never   Drug use: Never   Sexual activity: Not Currently  Other Topics Concern   Not on file  Social History Narrative   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  Divorced, 4 children, 1 adopted.  Husband died 05/02/23 from colon CA.; Son murdered (hanging) August 02, 1999, father died 08/02/99.  Lives w/ 35yo son (adopted).    Social Determinants of Health   Financial Resource Strain: Low Risk  (07/15/2022)   Overall Financial Resource Strain (CARDIA)    Difficulty of Paying Living Expenses: Not hard at all  Food Insecurity: No Food Insecurity (07/15/2022)   Hunger Vital Sign    Worried About Running Out of Food in the Last Year: Never true    Ran Out of Food in the Last Year:  Never true  Transportation Needs: No Transportation Needs (07/15/2022)   PRAPARE - Hydrologist (Medical): No    Lack of Transportation (Non-Medical): No  Physical Activity: Inactive (07/15/2022)   Exercise Vital Sign    Days of Exercise per Week: 0 days    Minutes of Exercise per Session: 0 min  Stress: No Stress Concern Present (07/15/2022)   [place]    Feeling of Stress : Only a little  Social Connections: Moderately Isolated (07/15/2022)   Social Connection and Isolation Panel [NHANES]    Frequency of Communication with Friends and Family: More than three times a week  Frequency of Social Gatherings with Friends and Family: Three times a week    Attends Religious Services: More than 4 times per year    Active Member of Clubs or Organizations: No    Attends Archivist Meetings: Never    Marital Status: Widowed    Tobacco Counseling Counseling given: Not Answered   Clinical Intake:  Pre-visit preparation completed: Yes  Pain : 0-10 Pain Score: 8  Pain Type: Chronic pain Pain Location: Leg (upper legs/thigh) Pain Descriptors / Indicators: Aching Pain Relieving Factors: tylenol and heat wih little relief  Pain Relieving Factors: tylenol and heat wih little relief  BMI - recorded: 30.02 Nutritional Status: BMI > 30  Obese Nutritional Risks: None Diabetes: Yes  How often do you need to have someone help you when you read instructions, pamphlets, or other written materials from your doctor or pharmacy?: 1 - Never  Diabetic?YES Interpreter Needed?: No Information entered by :: B.Willene Holian,LPN Activities of Daily Living    07/15/2022    2:48 PM  In your present state of health, do you have any difficulty performing the following activities:  Hearing? 0  Vision? 0  Difficulty concentrating or making decisions? 0  Walking or climbing stairs? 1  Comment has  difficulty climbing lots of stairs  Dressing or bathing? 0  Doing errands, shopping? 0  Preparing Food and eating ? N  Using the Toilet? N  In the past six months, have you accidently leaked urine? N  Do you have problems with loss of bowel control? N  Managing your Medications? N  Managing your Finances? N  Housekeeping or managing your Housekeeping? N    Patient Care Team: Colletta [place], MD as PCP - General (Family Medicine)  Indicate any recent Medical Services you may have received from other than [facility] providers in the past year (date may be approximate).     Assessment:   This is a routine wellness examination for [place].  Hearing/Vision screen Hearing Screening - Comments:: No problems hearing Vision Screening - Comments:: Vision well w/glasses.2 yrs since visit  Dietary issues and exercise activities discussed: Current Exercise Habits: The patient does not participate in regular exercise at present   Goals Addressed             This Visit's Progress    Care Coordination Activities-No follow up required   On track    Care Coordination Interventions: Evaluation of current treatment plan related to patients pain and completion of physical therapy and patient's adherence to plan as established by provider.       Depression Screen    07/15/2022    2:43 PM 05/14/2022   10:57 AM 04/10/2022    9:24 AM 03/10/2022   10:02 AM 10/30/2021    3:38 PM 10/07/2021    4:24 PM 09/10/2021    3:55 PM  PHQ 2/9 Scores  PHQ - 2 Score 2 2 1 1 1  0 0  PHQ- 9 Score 3 3 2 3 7 3 6     Fall Risk    07/15/2022    2:39 PM 05/14/2022   10:57 AM 04/10/2022    9:24 AM 03/10/2022    9:57 AM 10/30/2021    3:38 PM  Fall Risk   Falls in the past year? 0 0 0 0 0  Number falls in past yr: 0  0 0 0  Injury with Fall? 0  0 0 0  Risk for fall due to : No Fall Risks  Follow up Education provided;Falls prevention discussed        FALL RISK PREVENTION PERTAINING TO THE HOME:  Any stairs  in or around the home? Yes  If so, are there any without handrails? No  Home free of loose throw rugs in walkways, pet beds, electrical cords, etc? Yes  Adequate lighting in your home to reduce risk of falls? Yes   ASSISTIVE DEVICES UTILIZED TO PREVENT FALLS:  Life alert? No  Use of a cane, walker or w/c? No  Grab bars in the bathroom? No  Shower chair or bench in shower? No  Elevated toilet seat or a handicapped toilet? No   TCognitive Function:        07/15/2022    2:51 PM  6CIT Screen  What Year? 0 points  What month? 0 points  What time? 0 points  Count back from 20 0 points  Months in reverse 0 points  Repeat phrase 2 points  Total Score 2 points    Immunizations Immunization History  Administered Date(s) Administered   COVID-19, mRNA, vaccine(Comirnaty)12 years and older 05/14/2022   Fluad Quad(high Dose 65+) 02/28/2020, 03/10/2022   Influenza,inj,Quad PF,6+ Mos 08/27/2014, 06/21/2015, 04/13/2016, 04/21/2017, 03/15/2018, 03/09/2019   PFIZER(Purple Top)SARS-COV-2 Vaccination 08/17/2019, 09/13/2019   PPD Test 01/30/2020, 10/21/2020, 10/29/2020   Pfizer Covid-19 Vaccine Bivalent Booster 58yrs & up 04/20/2021   Pneumococcal Conjugate-13 08/03/2017   Pneumococcal Polysaccharide-23 04/13/2016   Td 09/13/1996   Tdap 11/30/2011, 06/27/2018   Zoster Recombinat (Shingrix) 06/12/2021    TDAP status: Up to date  Flu Vaccine status: Up to date  Pneumococcal vaccine status: Up to date  Covid-19 vaccine status: Completed vaccines  Qualifies for Shingles Vaccine? Yes   Zostavax completed Yes   Shingrix Completed?: Yes  Screening Tests Health Maintenance  Topic Date Due   Diabetic kidney evaluation - Urine ACR  07/06/2019   Zoster Vaccines- Shingrix (2 of 2) 08/07/2021   OPHTHALMOLOGY EXAM  02/04/2022   FOOT EXAM  10/08/2022   HEMOGLOBIN A1C  10/10/2022   Diabetic kidney evaluation - eGFR measurement  04/11/2023   Medicare Annual Wellness (AWV)  07/16/2023    MAMMOGRAM  04/08/2024   COLONOSCOPY (Pts 45-20yrs Insurance coverage will need to be confirmed)  07/10/2026   DTaP/Tdap/Td (4 - Td or Tdap) 06/27/2028   Pneumonia Vaccine 61+ Years old  Completed   INFLUENZA VACCINE  Completed   DEXA SCAN  Completed   COVID-19 Vaccine  Completed   Hepatitis C Screening  Completed   HPV VACCINES  Aged Out    Health Maintenance  Health Maintenance Due  Topic Date Due   Diabetic kidney evaluation - Urine ACR  07/06/2019   Zoster Vaccines- Shingrix (2 of 2) 08/07/2021   OPHTHALMOLOGY EXAM  02/04/2022    Colorectal cancer screening: Type of screening: Colonoscopy. Completed yes. Repeat every 5 years  Mammogram status: Completed yes. Repeat every year  Bone Density: NO pt says she has never had and wants to discuss with provider first  Lung Cancer Screening: (Low Dose CT Chest recommended if Age 44-80 years, 30 pack-year currently smoking OR have quit w/in 15years.) does not qualify.   Lung Cancer Screening Referral: no  Additional Screening:  Hepatitis C Screening: does not qualify; Completed no  Vision Screening: Recommended annual ophthalmology exams for early detection of glaucoma and other disorders of the eye. Is the patient up to date with their annual eye exam?  No  Who is the provider or what is the name of  the office in which the patient attends annual eye exams? Pt cannot recall as she sts ofice has changed names several times If pt is not established with a provider, would they like to be referred to a provider to establish care? No . Pt sts she will schedule visit soon.  Dental Screening: Recommended annual dental exams for proper oral hygiene;no problems with teeth-has appt in March;has dentures  Community Resource Referral / Chronic Care Management: CRR required this visit?  No   CCM required this visit?  No      Plan:     I have personally reviewed and noted the following in the patient's chart:   Medical and social  history Use of alcohol, tobacco or illicit drugs  Current medications and supplements including opioid prescriptions. Patient is not currently taking opioid prescriptions. Functional ability and status Nutritional status Physical activity Advanced directives List of other physicians Hospitalizations, surgeries, and ER visits in previous 12 months Vitals Screenings to include cognitive, depression, and falls Referrals and appointments  In addition, I have reviewed and discussed with patient certain preventive protocols, quality metrics, and best practice recommendations. A written personalized care plan for preventive services as well as general preventive health recommendations were provided to patient.     Sue Lush, LPN   [id number]/56/4332   Nurse Notes: Pt indicates she has never had a bone density scan and wants to talk with PCP before referral. Please review the Medications, Problem List, Past Medical and Surgical Histories as well as Family and Social Histories.

## 2022-07-15 NOTE — Patient Instructions (Signed)
Ms. Wiers , Thank you for taking time to come for your Medicare Wellness Visit. I appreciate your ongoing commitment to your health goals. Please review the following plan we discussed and let me know if I can assist you in the future.   These are the goals we discussed:  Goals      Care Coordination Activities-No follow up required     Care Coordination Interventions: Evaluation of current treatment plan related to patients pain and completion of physical therapy and patient's adherence to plan as established by provider.        This is a list of the screening recommended for you and due dates:  Health Maintenance  Topic Date Due   Yearly kidney health urinalysis for diabetes  07/06/2019   Zoster (Shingles) Vaccine (2 of 2) 08/07/2021   Eye exam for diabetics  02/04/2022   Complete foot exam   10/08/2022   Hemoglobin A1C  10/10/2022   Yearly kidney function blood test for diabetes  04/11/2023   Medicare Annual Wellness Visit  07/16/2023   Mammogram  04/08/2024   Colon Cancer Screening  07/10/2026   DTaP/Tdap/Td vaccine (4 - Td or Tdap) 06/27/2028   Pneumonia Vaccine  Completed   Flu Shot  Completed   DEXA scan (bone density measurement)  Completed   COVID-19 Vaccine  Completed   Hepatitis C Screening: USPSTF Recommendation to screen - Ages 22-79 yo.  Completed   HPV Vaccine  Aged Out    Advanced directives: yes  Conditions/risks identified: none  Next appointment: Follow up in one year for your annual wellness visit    Preventive Care 65 Years and Older, Female Preventive care refers to lifestyle choices and visits with your health care provider that can promote health and wellness. What does preventive care include? A yearly physical exam. This is also called an annual well check. Dental exams once or twice a year. Routine eye exams. Ask your health care provider how often you should have your eyes checked. Personal lifestyle choices, including: Daily care of your  teeth and gums. Regular physical activity. Eating a healthy diet. Avoiding tobacco and drug use. Limiting alcohol use. Practicing safe sex. Taking low-dose aspirin every day. Taking vitamin and mineral supplements as recommended by your health care provider. What happens during an annual well check? The services and screenings done by your health care provider during your annual well check will depend on your age, overall health, lifestyle risk factors, and family history of disease. Counseling  Your health care provider may ask you questions about your: Alcohol use. Tobacco use. Drug use. Emotional well-being. Home and relationship well-being. Sexual activity. Eating habits. History of falls. Memory and ability to understand (cognition). Work and work Statistician. Reproductive health. Screening  You may have the following tests or measurements: Height, weight, and BMI. Blood pressure. Lipid and cholesterol levels. These may be checked every 5 years, or more frequently if you are over 40 years old. Skin check. Lung cancer screening. You may have this screening every year starting at age 64 if you have a 30-pack-year history of smoking and currently smoke or have quit within the past 15 years. Fecal occult blood test (FOBT) of the stool. You may have this test every year starting at age 39. Flexible sigmoidoscopy or colonoscopy. You may have a sigmoidoscopy every 5 years or a colonoscopy every 10 years starting at age 9. Hepatitis C blood test. Hepatitis B blood test. Sexually transmitted disease (STD) testing. Diabetes screening. This  is done by checking your blood sugar (glucose) after you have not eaten for a while (fasting). You may have this done every 1-3 years. Bone density scan. This is done to screen for osteoporosis. You may have this done starting at age 60. Mammogram. This may be done every 1-2 years. Talk to your health care provider about how often you should have  regular mammograms. Talk with your health care provider about your test results, treatment options, and if necessary, the need for more tests. Vaccines  Your health care provider may recommend certain vaccines, such as: Influenza vaccine. This is recommended every year. Tetanus, diphtheria, and acellular pertussis (Tdap, Td) vaccine. You may need a Td booster every 10 years. Zoster vaccine. You may need this after age 75. Pneumococcal 13-valent conjugate (PCV13) vaccine. One dose is recommended after age 33. Pneumococcal polysaccharide (PPSV23) vaccine. One dose is recommended after age 42. Talk to your health care provider about which screenings and vaccines you need and how often you need them. This information is not intended to replace advice given to you by your health care provider. Make sure you discuss any questions you have with your health care provider. Document Released: 06/28/2015 Document Revised: 02/19/2016 Document Reviewed: 04/02/2015 Elsevier Interactive Patient Education  2017 [place] Prevention in the Home Falls can cause injuries. They can happen to people of all ages. There are many things you can do to make your home safe and to help prevent falls. What can I do on the outside of my home? Regularly fix the edges of walkways and driveways and fix any cracks. Remove anything that might make you trip as you walk through a door, such as a raised step or threshold. Trim any bushes or trees on the path to your home. Use bright outdoor lighting. Clear any walking paths of anything that might make someone trip, such as rocks or tools. Regularly check to see if handrails are loose or broken. Make sure that both sides of any steps have handrails. Any raised decks and porches should have guardrails on the edges. Have any leaves, snow, or ice cleared regularly. Use sand or salt on walking paths during winter. Clean up any spills in your garage right away. This  includes oil or grease spills. What can I do in the bathroom? Use night lights. Install grab bars by the toilet and in the tub and shower. Do not use towel bars as grab bars. Use non-skid mats or decals in the tub or shower. If you need to sit down in the shower, use a plastic, non-slip stool. Keep the floor dry. Clean up any water that spills on the floor as soon as it happens. Remove soap buildup in the tub or shower regularly. Attach bath mats securely with double-sided non-slip rug tape. Do not have throw rugs and other things on the floor that can make you trip. What can I do in the bedroom? Use night lights. Make sure that you have a light by your bed that is easy to reach. Do not use any sheets or blankets that are too big for your bed. They should not hang down onto the floor. Have a firm chair that has side arms. You can use this for support while you get dressed. Do not have throw rugs and other things on the floor that can make you trip. What can I do in the kitchen? Clean up any spills right away. Avoid walking on wet floors. Keep items that  you use a lot in easy-to-reach places. If you need to reach something above you, use a strong step stool that has a grab bar. Keep electrical cords out of the way. Do not use floor polish or wax that makes floors slippery. If you must use wax, use non-skid floor wax. Do not have throw rugs and other things on the floor that can make you trip. What can I do with my stairs? Do not leave any items on the stairs. Make sure that there are handrails on both sides of the stairs and use them. Fix handrails that are broken or loose. Make sure that handrails are as long as the stairways. Check any carpeting to make sure that it is firmly attached to the stairs. Fix any carpet that is loose or worn. Avoid having throw rugs at the top or bottom of the stairs. If you do have throw rugs, attach them to the floor with carpet tape. Make sure that you  have a light switch at the top of the stairs and the bottom of the stairs. If you do not have them, ask someone to add them for you. What else can I do to help prevent falls? Wear shoes that: Do not have high heels. Have rubber bottoms. Are comfortable and fit you well. Are closed at the toe. Do not wear sandals. If you use a stepladder: Make sure that it is fully opened. Do not climb a closed stepladder. Make sure that both sides of the stepladder are locked into place. Ask someone to hold it for you, if possible. Clearly mark and make sure that you can see: Any grab bars or handrails. First and last steps. Where the edge of each step is. Use tools that help you move around (mobility aids) if they are needed. These include: Canes. Walkers. Scooters. Crutches. Turn on the lights when you go into a dark area. Replace any light bulbs as soon as they burn out. Set up your furniture so you have a clear path. Avoid moving your furniture around. If any of your floors are uneven, fix them. If there are any pets around you, be aware of where they are. Review your medicines with your doctor. Some medicines can make you feel dizzy. This can increase your chance of falling. Ask your doctor what other things that you can do to help prevent falls. This information is not intended to replace advice given to you by your health care provider. Make sure you discuss any questions you have with your health care provider. Document Released: 03/28/2009 Document Revised: 11/07/2015 Document Reviewed: 07/06/2014 Elsevier Interactive Patient Education  2017 [employer].

## 2022-07-17 NOTE — Progress Notes (Unsigned)
    SUBJECTIVE:   CHIEF COMPLAINT / HPI:   Diabetes Current Regimen: Metformin 100mg  BID Last A1c:  Lab Results  Component Value Date   HGBA1C 6.4 (A) 07/23/2022   HGBA1C 0 07/23/2022   HGBA1C 0 (A) 07/23/2022   HGBA1C 0.0 07/23/2022    Denies polyuria, polydipsia, hypoglycemia. Last Eye Exam: Obtain prior records Statin: Lipitor 40mg  ACE/ARB: Lisinopril 20mg   Hypertension: - Medications: Amlodipine 10mg , Lisinopril  - Compliance: Yes - Checking BP at home: 130s/60s - Denies any SOB, CP, vision changes, LE edema, medication SEs, or symptoms of hypotension  L trigger finger: Reports continued pain in L ring finger that gets "stuck". Reports she did not hear back about the referral to [facility] and [facility] but not would like to go back to [place] for therapy. Discussed potential injection with PM&R prior to orthopedics referral. Denies she is doing anything for symptom management at this time.   PERTINENT  PMH / PSH: HTN, T2DM  OBJECTIVE:   BP 126/63   Pulse (!) 59   Wt 183 lb 6 oz (83.2 kg)   SpO2 100%   BMI 29.60 kg/m   General: NAD, pleasant, able to participate in exam Cardiac: RRR, no murmurs. Respiratory: CTAB, normal effort, No wheezes, rales or rhonchi Abdomen: Bowel sounds present, nontender, nondistended Extremities: no edema or cyanosis. Skin: warm and dry, no rashes noted Neuro: alert, no obvious focal deficits Psych: Normal affect and mood MSK: Locking of L ring finger and limited flexion.  ASSESSMENT/PLAN:   HYPERTENSION, BENIGN SYSTEMIC BP at goal today and reports good control at home. Continue current regimen of Amlodipine 10mg  and Lisinopril 20mg . Denies side effects.  Hypothyroidism Recheck TSH at next visit in 6 months  Type 2 diabetes mellitus without complication, without long-term current use of insulin (HCC) A1c 6.4 today, has been consistently <7 for the past year. F/u A1c in 6 months and if continues to be controlled, consider  decreasing to Metformin 500mg  BID. PC ratio today.  Left trigger finger Persistent locking of L ring finger. Advised Bandaid/taping proximal joint to help with locking. Seeing PM&R for thigh pain, advised discussing trigger finger injection for management. Defer referral to [place] until trial more conservative therapy.     Dr. Colletta Maryland, [place]

## 2022-07-21 NOTE — Progress Notes (Signed)
Subjective:    [name], female    DOB: Apr 30, 1951, 72 y.o.   MRN: [id number]  HPI HPI  Candice Warner is a 72 y.o. year old female  who  has a past medical history of Arthritis, Depression, Hyperlipidemia, Hypertension, Hypokalemia, Hypothyroidism, Lumbar back pain, Obesity, Radiculopathy, Thyroid disease, and Type II diabetes mellitus ([place]).   They are presenting to PM&R clinic as a new patient for pain management evaluation. They were referred by McDiarmid, Blane Ohara, MD  for treatment of bilateral thigh pain.   Per their record:  Spoke with patient about XR results including mild OA in hips bilaterally and mild to moderate OA in lumbar spine. Patient with persistent symptoms not relieved by Tylenol, Gabapentin, Voltaren gel, PT or massage. In discussion with Dr. Wendy Poet, will refer patient to PM&R for further evaluation. Patient agreeable. Advised to continue Tylenol 662m TID prn and PT stretching in the interim time. Return to clinic sooner if symptoms worsening.   KColletta Maryland DO     Xrays from 05/20/22:   FINDINGS: There is no evidence of lumbar spine fracture. Alignment is normal. Mild-to-moderate degenerative joint changes with anterior spurring narrow intervertebral spaces facet joint sclerosis are identified throughout the lumbar spine and lower thoracic spine more prominently involving the lower thoracic spine and lower lumbar spine. The findings are advanced compared to prior exam.   Images of the bilateral hips demonstrate narrowed joint space and osteophyte formation identified in the left hip. Mild osteophytosis is identified in the right acetabulum and intertrochanteric right femur.   IMPRESSION: Mild-to-moderate degenerative joint changes of the lumbar spine and lower thoracic spine, advanced compared to prior exam.   Mild degenerative joint changes of bilateral hips, left greater than right.    Source: Bilateral thighs and low back,  bilateral Inciting incident: none; going on for 2 years Description of pain: constant and aching Exacerbating factors: bending forward, bending backwards, laying flat, laying on side, sitting, standing, and activity Remitting factors: massage, pain medication, heating pad, and hot bath Red flag symptoms: new weakness and pain waking up at nighttime.  Medications tried: Topical medications (no effect) : Voltaren gel Nsaids ( unable to take ) : "Aleve was messing with my kidneys" Tylenol  (mild effect) : Taking 650 mg, takes 2 tabs twice per day. Lasts 2 hours only.  Opiates  (no effect) : Norco 5 mg #20 08/07/20; made her constipated.  Gabapentin / Lyrica  (no effect) : was on gabapentin 100 gm once daily; stopped.  TCAs  (never tried) :  SNRIs  (never tried) :  Other  (never tried) :   Other treatments: PT/OT  ( made pain worse ) : 01/2022 - posterior gluteal pain to just above knee; did not help. Massage was good but moving/stretching hurt her more. HEP not tolerable when she is hurting.  Accupuncture/chiropractor/massage  (never tried) :  TENs unit (never tried) :  Injections (never tried) :  Surgery (never tried) :  Other  (never tried) :   Goals for pain control: She would like to clean her house. Reaching up for objects and bending down under the cabinets hurt too much. She wasn't able to get up last time she reached under a counter because of pain; took 5 minutes to get back up.  Prior UDS results:     Component Value Date/Time   LABOPIA NONE DETECTED 06/27/2018 2319   COCAINSCRNUR NONE DETECTED 06/27/2018 2319   LABBENZ NONE DETECTED 06/27/2018  [place] 06/27/2018 2319   THCU NONE DETECTED 06/27/2018 2319   LABBARB NONE DETECTED 06/27/2018 2319     ROS: Review of Systems  Cardiovascular:  Negative for claudication and leg swelling.  Gastrointestinal:  Negative for constipation.  Genitourinary:  Negative for urgency.  Musculoskeletal:  Positive for  back pain and joint pain. Negative for myalgias.       Trouble walking  Neurological:  Negative for sensory change, focal weakness and weakness.  All other systems reviewed and are negative.    Pain Inventory Average Pain 8 Pain Right Now 9 My pain is stabbing  In the last 24 hours, has pain interfered with the following? General activity 10 Relation with others 0 Enjoyment of life 9 What TIME of day is your pain at its worst? 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  Neuro / Psych Trouble walking  Family History  Problem Relation Age of Onset   Heart disease Mother    Heart disease Father    Breast cancer Maternal Aunt    Social History   Socioeconomic History   Marital status: Widowed    Spouse name: Not on file   Number of children: Not on file   Years of education: Not on file   Highest education level: Not on file  Occupational History   Not on file  Tobacco Use   Smoking status: Never   Smokeless tobacco: Never  Vaping Use   Vaping Use: Never used  Substance and Sexual Activity   Alcohol use: Never   Drug use: Never   Sexual activity: Not Currently  Other Topics Concern   Not on file  Social History Narrative   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  Divorced, 4 children, 1 adopted.  Husband died 05-14-2023 from colon CA.; Son murdered (hanging) 1999-08-14, father died 1999-08-22.  Lives w/ 62yo son (adopted).    Social Determinants of Health   Financial Resource Strain: Low Risk  (07/15/2022)   Overall Financial Resource Strain (CARDIA)    Difficulty of Paying Living Expenses: Not hard at all  Food Insecurity: No Food Insecurity (07/15/2022)   Hunger Vital Sign    Worried About Running Out of Food in the Last  Year: Never true    Ran Out of Food in the Last Year: Never true  Transportation Needs: No Transportation Needs (07/15/2022)   PRAPARE - Hydrologist (Medical): No    Lack of Transportation (Non-Medical): No  Physical Activity: Inactive (07/15/2022)   Exercise Vital Sign    Days of Exercise per Week: 0 days    Minutes of Exercise per Session: 0 min  Stress: No Stress Concern Present (07/15/2022)   [place]    Feeling of Stress : Only a little  Social Connections: Moderately Isolated (07/15/2022)   Social Connection and Isolation Panel [NHANES]    Frequency of Communication with Friends and Family: More than three times a week    Frequency of Social Gatherings with Friends and Family: Three times a week    Attends Religious Services: More than 4 times per year    Active Member of Clubs or Organizations:  No    Attends Archivist Meetings: Never    Marital Status: Widowed   Past Surgical History:  Procedure Laterality Date   CYST REMOVAL HAND Right 11/07/2015   Procedure: EXCISION OF VOLAR RETANICULUM RIGHT HAND;  Surgeon: Ninetta Lights, MD;  Location: [facility];  Service: Orthopedics;  Laterality: Right;   SHOULDER ARTHROSCOPY Left 2002   SHOULDER ARTHROSCOPY WITH ROTATOR CUFF REPAIR AND SUBACROMIAL DECOMPRESSION  07/14/2012   Procedure: SHOULDER ARTHROSCOPY WITH ROTATOR CUFF REPAIR AND SUBACROMIAL DECOMPRESSION;  Surgeon: Ninetta Lights, MD;  Location: [facility];  Service: Orthopedics;  Laterality: Right;  Right Shoulder Arthroscopy, Distal Claviculectomy, Subacromial Decompression, Partial Acromioplasty with Coracoacromial Release with Arthroscopic Rotator Cuff Repair    THYROID LOBECTOMY Right    Goiter s/p right thyroidectomy   TRIGGER FINGER RELEASE Right 11/07/2015   Procedure: RELEASE TRIGGER FINGER/A-1 PULLEY RIGHT RING FINGER ;  Surgeon: Ninetta Lights, MD;  Location: [facility];  Service: Orthopedics;  Laterality: Right;   TUBAL LIGATION     Past Surgical History:  Procedure Laterality Date   CYST REMOVAL HAND Right 11/07/2015   Procedure: EXCISION OF VOLAR RETANICULUM RIGHT HAND;  Surgeon: Ninetta Lights, MD;  Location: [facility];  Service: Orthopedics;  Laterality: Right;   SHOULDER ARTHROSCOPY Left 2002   SHOULDER ARTHROSCOPY WITH ROTATOR CUFF REPAIR AND SUBACROMIAL DECOMPRESSION  07/14/2012   Procedure: SHOULDER ARTHROSCOPY WITH ROTATOR CUFF REPAIR AND SUBACROMIAL DECOMPRESSION;  Surgeon: Ninetta Lights, MD;  Location: [facility];  Service: Orthopedics;  Laterality: Right;  Right Shoulder Arthroscopy, Distal Claviculectomy, Subacromial Decompression, Partial Acromioplasty with Coracoacromial Release with Arthroscopic Rotator Cuff Repair    THYROID LOBECTOMY Right    Goiter s/p right thyroidectomy   TRIGGER FINGER RELEASE Right 11/07/2015   Procedure: RELEASE TRIGGER FINGER/A-1 PULLEY RIGHT RING FINGER ;  Surgeon: Ninetta Lights, MD;  Location: [facility];  Service: Orthopedics;  Laterality: Right;   TUBAL LIGATION     Past Medical History:  Diagnosis Date   Arthritis    "right hip" (06/27/2018)   Depression    Hyperlipidemia    Hypertension    Hypokalemia    Hypothyroidism    Lumbar back pain    Obesity    Radiculopathy    Thyroid disease    Type II diabetes mellitus ([place])    There were no vitals taken for this visit.  Opioid Risk Score:   Fall Risk Score:  `1  Depression screen [facility] 2/9     07/15/2022    2:43 PM 05/14/2022   10:57 AM 04/10/2022    9:24 AM 03/10/2022   10:02 AM 10/30/2021    3:38 PM 10/07/2021    4:24 PM 09/10/2021    3:55 PM  Depression screen PHQ 2/9  Decreased Interest 1 1 1 1 1 $ 0 0  Down, Depressed, Hopeless 1 1 0 0 0 0 0  PHQ - 2 Score 2 2 1 1 1 $ 0 0  Altered sleeping 0 0 0 1 2 2 3  $ Tired, decreased energy 1 1 1 1 3 1 3   $ Change in appetite 0 0 0 0 0 0 0  Feeling bad or failure about yourself  0 0 0 0 0 0 0  Trouble concentrating 0 0 0 0 0 0 0  Moving slowly or fidgety/restless 0 0 0 0 1 0 0  Suicidal thoughts 0 0 0 0 0 0 0  PHQ-9 Score 3 3 2 3 7 3 6  $ Difficult doing work/chores  Somewhat difficult  Somewhat difficult Very difficult  Not difficult at all    Review of Systems  Cardiovascular:  Negative for claudication and leg swelling.  Gastrointestinal:  Negative for constipation.  Genitourinary:  Negative for urgency.  Musculoskeletal:  Positive for back pain and joint pain. Negative for myalgias.       Trouble walking  Neurological:  Negative for sensory change, focal weakness and weakness.  All other systems reviewed and are negative.      Objective:   Physical Exam   PE: Constitution: Appropriate appearance for age. No apparent distress  HEENT: PERRL, EOMI grossly intact.  Resp: CTAB. No rales, rhonchi, or wheezing. Cardio: RRR. No mumurs, rubs, or gallops.  No peripheral edema. Abdomen: Nondistended. Nontender. +bowel sounds. Psych: Appropriate mood and affect. Neuro: AAOx4. No apparent deficits  Sensory exam: revealed normal sensation in all dermatomal regions in bilateral upper extremities and bilateral lower extremities Motor exam: strength 5/5 throughout bilateral upper extremities and bilateral lower extremities Coordination: Fine motor coordination was normal.   Gait: +antalgic gait  Back/Hip exam:  Back Exam:   Inspection: Pelvis was  even.  Lumbar lordotic curvature was  wnl .  There was no evidence of scoliosis.  Palpation: Palpatory exam revealed + tightness and TTP bilateral lumbar paraspinals, latissmus dorsi. No TTP bilateral SI joints, greater trochanters, or ITB . There was  no evidence of spasm.   Special/provocative testing:   SLR: negative +thomas test b/l +thigh pain with faber, fadir        Assessment & Plan:   Assessment and Plan: Candice Warner is a  72 y.o. year old female  who  has a past medical history of Arthritis, Depression, Hyperlipidemia, Hypertension, Hypokalemia, Hypothyroidism, Lumbar back pain, Obesity, Radiculopathy, Thyroid disease, and Type II diabetes mellitus ([place]).   They are presenting to PM&R clinic as a new patient for treatment of b/l low back and thigh pain .   Facet arthropathy, multilevel Assessment & Plan: Prior Xrays with lower lumbar and thoracic DJD, along with mild L>R hip OA  Patient may benefit from ESI vs. MBB; difficult to determine etiology of pain on exam   Please have MRI done of your lumbar spine. I will call you with results.  Orders: -     MR LUMBAR SPINE WO CONTRAST; Future  Myofascial low back pain Assessment & Plan: Please start Tizanidine one half tablet every 8 hours as needed for back/thigh tightness. Call me in 1 week to report any side effects.   Follow up in 2 weeks for trigger point injections   Impaired mobility and ADLs Assessment & Plan: We will further discuss return to PT once your symptoms are better controlled  Orders: -     MR [place]; Future  Arthritis -     MR LUMBAR SPINE WO CONTRAST; Future  Other orders -     tiZANidine HCl; Take 0.5 tablets (2 mg total) by mouth every 8 (eight) hours as needed for muscle spasms.  Dispense: 30 tablet; Refill: [place], DO

## 2022-07-22 ENCOUNTER — Encounter: Payer: Self-pay | Admitting: Physical Medicine and Rehabilitation

## 2022-07-22 ENCOUNTER — Encounter: Payer: Medicare HMO | Attending: Physical Medicine and Rehabilitation | Admitting: Physical Medicine and Rehabilitation

## 2022-07-22 VITALS — BP 158/78 | HR 60 | Ht 66.0 in | Wt 184.0 lb

## 2022-07-22 DIAGNOSIS — Z7409 Other reduced mobility: Secondary | ICD-10-CM | POA: Insufficient documentation

## 2022-07-22 DIAGNOSIS — Z789 Other specified health status: Secondary | ICD-10-CM | POA: Insufficient documentation

## 2022-07-22 DIAGNOSIS — M1990 Unspecified osteoarthritis, unspecified site: Secondary | ICD-10-CM | POA: Insufficient documentation

## 2022-07-22 DIAGNOSIS — M545 Low back pain, unspecified: Secondary | ICD-10-CM | POA: Insufficient documentation

## 2022-07-22 DIAGNOSIS — M47819 Spondylosis without myelopathy or radiculopathy, site unspecified: Secondary | ICD-10-CM | POA: Insufficient documentation

## 2022-07-22 MED ORDER — TIZANIDINE HCL 4 MG PO TABS: 2.0000 mg | ORAL_TABLET | Freq: Three times a day (TID) | ORAL | 2 refills | Status: DC | PRN

## 2022-07-22 NOTE — Patient Instructions (Addendum)
Pleas ehave MRI done of your lumbar spine. I will call you with results.  Please start Tizanidine one half tablet every 8 hours as needed for back/thigh tightness. Call me in 1 week to report any side effects.   Follow up in 2 weeks for trigger point injections  We will further discuss return to PT once your symptoms are better controlled

## 2022-07-23 ENCOUNTER — Encounter: Payer: Self-pay | Admitting: Family Medicine

## 2022-07-23 ENCOUNTER — Ambulatory Visit (INDEPENDENT_AMBULATORY_CARE_PROVIDER_SITE_OTHER): Payer: Medicare HMO | Admitting: Family Medicine

## 2022-07-23 VITALS — BP 126/63 | HR 59 | Wt 183.4 lb

## 2022-07-23 DIAGNOSIS — I10 Essential (primary) hypertension: Secondary | ICD-10-CM

## 2022-07-23 DIAGNOSIS — E039 Hypothyroidism, unspecified: Secondary | ICD-10-CM | POA: Diagnosis not present

## 2022-07-23 DIAGNOSIS — M65342 Trigger finger, left ring finger: Secondary | ICD-10-CM

## 2022-07-23 DIAGNOSIS — E119 Type 2 diabetes mellitus without complications: Secondary | ICD-10-CM

## 2022-07-23 LAB — POCT GLYCOSYLATED HEMOGLOBIN (HGB A1C)
HbA1c POC (<> result, manual entry): 0 % (ref 4.0–5.6)
HbA1c, POC (controlled diabetic range): 0 % (ref 0.0–7.0)
HbA1c, POC (prediabetic range): 0 % — AB (ref 5.7–6.4)
Hemoglobin A1C: 6.4 % — AB (ref 4.0–5.6)

## 2022-07-23 NOTE — Assessment & Plan Note (Signed)
BP at goal today and reports good control at home. Continue current regimen of Amlodipine 10mg  and Lisinopril 20mg . Denies side effects.

## 2022-07-23 NOTE — Assessment & Plan Note (Signed)
Recheck TSH at next visit in 6 months

## 2022-07-23 NOTE — Assessment & Plan Note (Addendum)
A1c 6.4 today, has been consistently <7 for the past year. F/u A1c in 6 months and if continues to be controlled, consider decreasing to Metformin 500mg  BID. PC ratio today.

## 2022-07-23 NOTE — Assessment & Plan Note (Addendum)
Persistent locking of L ring finger. Advised Bandaid/taping proximal joint to help with locking. Seeing PM&R for thigh pain, advised discussing trigger finger injection for management. Defer referral to [place] until trial more conservative therapy.

## 2022-07-23 NOTE — Patient Instructions (Signed)
It was wonderful to see you today! Thank you for choosing [facility].   Please bring ALL of your medications with you to every visit.   Today we talked about:  1. Your blood pressure looks great today! Please continue to monitor it at home periodically and let me know if it is consistently greater than 140/90. 2. Your A1c is 6.4! Your diabetes has been well controlled so keep doing what you are doing as far as diet and exercise. We can check your A1c again in about 6 months. I checked a lab today to see if your have protein in your urine and will follow up with you about the results. 3. Please continue to see the Pain Management doctor for your thigh pain. They can also help with your trigger finger. In the mean time you can tape your finger with Bandaids or tape to help prevent it from locking up.  Please follow up in 6 months   If you haven't already, sign up for My Chart to have easy access to your labs results, and communication with your primary care physician.   We are checking some labs today. If they are abnormal, I will call you. If they are normal, I will send you a MyChart message (if it is active) or a letter in the mail. If you do not hear about your labs in the next 2 weeks, please call the office.  Call the clinic at [phone] if your symptoms worsen or you have any concerns.  Please be sure to schedule follow up at the front desk before you leave today.   Colletta Maryland, DO Family Medicine

## 2022-07-25 LAB — PROTEIN / CREATININE RATIO, URINE
Creatinine, Urine: 149.4 mg/dL
Protein, Ur: 35.2 mg/dL
Protein/Creat Ratio: 236 mg/g creat — ABNORMAL HIGH (ref 0–200)

## 2022-07-27 ENCOUNTER — Encounter: Payer: Self-pay | Admitting: Family Medicine

## 2022-07-27 DIAGNOSIS — M545 Low back pain, unspecified: Secondary | ICD-10-CM | POA: Insufficient documentation

## 2022-07-27 DIAGNOSIS — Z7409 Other reduced mobility: Secondary | ICD-10-CM | POA: Insufficient documentation

## 2022-07-27 DIAGNOSIS — Z789 Other specified health status: Secondary | ICD-10-CM | POA: Insufficient documentation

## 2022-07-27 NOTE — Assessment & Plan Note (Signed)
Please start Tizanidine one half tablet every 8 hours as needed for back/thigh tightness. Call me in 1 week to report any side effects.   Follow up in 2 weeks for trigger point injections

## 2022-07-27 NOTE — Assessment & Plan Note (Signed)
Prior Xrays with lower lumbar and thoracic DJD, along with mild L>R hip OA  Patient may benefit from ESI vs. MBB; difficult to determine etiology of pain on exam   Please have MRI done of your lumbar spine. I will call you with results.

## 2022-07-27 NOTE — Assessment & Plan Note (Signed)
We will further discuss return to PT once your symptoms are better controlled

## 2022-08-05 ENCOUNTER — Ambulatory Visit
Admission: RE | Admit: 2022-08-05 | Discharge: 2022-08-05 | Disposition: A | Discharge status: Home / Self Care | Payer: Medicare HMO | Source: Ambulatory Visit | Attending: Physical Medicine and Rehabilitation | Admitting: Physical Medicine and Rehabilitation

## 2022-08-05 ENCOUNTER — Encounter: Payer: Medicare HMO | Admitting: Physical Medicine and Rehabilitation

## 2022-08-05 ENCOUNTER — Encounter: Payer: Self-pay | Admitting: Physical Medicine and Rehabilitation

## 2022-08-05 VITALS — BP 173/70 | HR 60 | Ht 66.0 in | Wt 183.0 lb

## 2022-08-05 DIAGNOSIS — M47819 Spondylosis without myelopathy or radiculopathy, site unspecified: Secondary | ICD-10-CM

## 2022-08-05 DIAGNOSIS — M4126 Other idiopathic scoliosis, lumbar region: Secondary | ICD-10-CM | POA: Diagnosis not present

## 2022-08-05 DIAGNOSIS — Z7409 Other reduced mobility: Secondary | ICD-10-CM | POA: Diagnosis not present

## 2022-08-05 DIAGNOSIS — Z789 Other specified health status: Secondary | ICD-10-CM | POA: Diagnosis not present

## 2022-08-05 DIAGNOSIS — M545 Low back pain, unspecified: Secondary | ICD-10-CM | POA: Diagnosis not present

## 2022-08-05 DIAGNOSIS — M48061 Spinal stenosis, lumbar region without neurogenic claudication: Secondary | ICD-10-CM | POA: Diagnosis not present

## 2022-08-05 DIAGNOSIS — M1990 Unspecified osteoarthritis, unspecified site: Secondary | ICD-10-CM

## 2022-08-05 DIAGNOSIS — R29898 Other symptoms and signs involving the musculoskeletal system: Secondary | ICD-10-CM | POA: Diagnosis not present

## 2022-08-05 DIAGNOSIS — M4316 Spondylolisthesis, lumbar region: Secondary | ICD-10-CM | POA: Diagnosis not present

## 2022-08-05 MED ORDER — LIDOCAINE HCL 1 % IJ SOLN
6.0000 mL | Freq: Once | INTRAMUSCULAR | Status: AC
  Administered 2022-08-05: 6 mL

## 2022-08-05 NOTE — Patient Instructions (Signed)
-   Resume Usual Activities. Notify Physician of any unusual bleeding, erythema or concern for side effects as reviewed above. - Apply ice prn for pain - Tylenol prn for pain - Follow up in 6 weeks to assess response to injection

## 2022-08-05 NOTE — Progress Notes (Signed)
HPI: Candice Warner is a 72 y.o. female with PMHx has Hypothyroidism; Type 2 diabetes mellitus without complication, without long-term current use of insulin ([place]); HLD (hyperlipidemia); OBESITY, NOS; Major depressive disorder, recurrent episode ([place]); HYPERTENSION, BENIGN SYSTEMIC; DIVERTICULOSIS OF COLON; Facet arthropathy, multilevel; Right hip pain; Left trigger finger; Nodule of chest wall; Osteoarthritis; Calcification of left breast; Thigh pain; Myofascial low back pain; and Impaired mobility and ADLs on their problem list. who presents to clinic for treatment of pain related to myofascial pain  via injection as described below.    No new concerns or complaints. No major changes in medical history since last visit. Initially high bp but after sitting 5 minutes was 129/74.  Physical Exam:  General: Appropriate appearance for age.  Mental Status: Appropriate mood and affect.  Cardiovascular: RRR, no m/r/g.  Respiratory: CTAB, no rales/rhonchi/wheezing.  Skin: No apparent rashes or lesions.  Neuro: Awake, alert, and oriented x3. No apparent deficits.  MSK:  Moving all 4 limbs antigravity and against resistance.   PROCEDURE:  Bilateral  trigger point injections Diagnosis:    ICD-10-CM   1. Myofascial low back pain  M54.50 lidocaine (XYLOCAINE) 1 % (with pres) injection 6 mL      Goals with treatment: [ x ] Decrease pain [ ]$  Improve Active / Passive ROM [ x ] Improve ADLs [  ] Improve functional mobility  MEDICATION:  [ x ] Kenalog 40 mg/mL  [ X ] Lidocaine 1%    CONSENT: Obtained in writing per policy. Consent uploaded to chart.  Benefits discussed.  Risks discussed included, but were not limited to, pain and discomfort, bleeding, bruising, allergic reaction, infection. All questions answered to patient/family member/guardian/ caregiver satisfaction. They would like to proceed with procedure. There are no noted contraindications to procedure.  PROCEDURE Time out was  preformed No heat sources No antibiotics  The patient was explained about both the benefits and risks of a Bilateral  trigger point injections. After the patient acknowledged an understanding of the risks and benefits, the patient agreed to proceed. The area was first marked and then prepped in an aseptic fashion with betadine / alcohol. A 30 g, 1/2 inch needle was directed via a posterior approach into the Bilateral   lumbar paraspinals, gluteus muscles, L rhomboids . The injection was completed with Kenalog 40 mg/ml 0.2 cc mixed with 6 cc of 1% lidocaine after no blood was aspirated on pull back.  The pt tolerated the procedure well.   Preprocedural pain was 8 /10 Postprocedural pain was 0 [id number] No complications were encountered. The patient tolerated the procedure well.  Impression: HPI: Candice Warner is a 72 y.o. female with PMHx has Hypothyroidism; Type 2 diabetes mellitus without complication, without long-term current use of insulin ([place]); HLD (hyperlipidemia); OBESITY, NOS; Major depressive disorder, recurrent episode ([place]); HYPERTENSION, BENIGN SYSTEMIC; DIVERTICULOSIS OF COLON; Facet arthropathy, multilevel; Right hip pain; Left trigger finger; Nodule of chest wall; Osteoarthritis; Calcification of left breast; Thigh pain; Myofascial low back pain; and Impaired mobility and ADLs on their problem list. who presents to clinic for treatment of low back pain . They received a  Bilateral  trigger point injections as above.   PLAN: - Resume Usual Activities. Notify Physician of any unusual bleeding, erythema or concern for side effects as reviewed above. - Apply ice prn for pain - Tylenol prn for pain - Follow up in 6 weeks to assess response to injection   Patient/Care Giver was ready to learn without apparent  learning barriers. Education was provided on diagnosis, treatment options/plan according to patient's preferred learning style. Patient/Care Giver verbalized understanding and  agreement with the above plan.   Gertie Gowda, DO 08/05/2022

## 2022-08-10 ENCOUNTER — Telehealth: Payer: Self-pay | Admitting: Physical Medicine and Rehabilitation

## 2022-08-10 DIAGNOSIS — M545 Low back pain, unspecified: Secondary | ICD-10-CM

## 2022-08-10 DIAGNOSIS — M48061 Spinal stenosis, lumbar region without neurogenic claudication: Secondary | ICD-10-CM

## 2022-08-10 MED ORDER — GABAPENTIN 300 MG PO CAPS: 300.0000 mg | ORAL_CAPSULE | Freq: Two times a day (BID) | ORAL | 2 refills | Status: DC

## 2022-08-10 NOTE — Telephone Encounter (Signed)
Called patient and reviewed results of recent MRI. Advised referral to Dr. Letta Pate for ESI. If no improvement with this, will refer to neurosurgery given multiple levels of severe stenosis.  Pt reports no minimal pain relief with tizanidine. Prescribed gabapentin 300 mg BID until f/u.

## 2022-08-11 ENCOUNTER — Other Ambulatory Visit: Payer: Self-pay | Admitting: Family Medicine

## 2022-08-11 DIAGNOSIS — E039 Hypothyroidism, unspecified: Secondary | ICD-10-CM

## 2022-08-24 ENCOUNTER — Telehealth: Payer: Self-pay | Admitting: Physical Medicine and Rehabilitation

## 2022-08-24 NOTE — Telephone Encounter (Signed)
LVM for patient to call office and schedule an ESI for mulilevel neuroforaminal stenosis (b/l L3/4, L4/5 with Dr. Letta Pate.

## 2022-09-04 ENCOUNTER — Other Ambulatory Visit: Payer: Self-pay | Admitting: Family Medicine

## 2022-09-04 DIAGNOSIS — F339 Major depressive disorder, recurrent, unspecified: Secondary | ICD-10-CM

## 2022-09-04 DIAGNOSIS — E119 Type 2 diabetes mellitus without complications: Secondary | ICD-10-CM

## 2022-09-04 MED ORDER — SERTRALINE HCL 100 MG PO TABS: 50.0000 mg | ORAL_TABLET | Freq: Every day | ORAL | 1 refills | Status: DC

## 2022-09-13 ENCOUNTER — Other Ambulatory Visit: Payer: Self-pay | Admitting: Family Medicine

## 2022-09-15 ENCOUNTER — Other Ambulatory Visit: Payer: Self-pay | Admitting: *Deleted

## 2022-09-15 ENCOUNTER — Other Ambulatory Visit: Payer: Self-pay | Admitting: Physical Medicine and Rehabilitation

## 2022-09-15 MED ORDER — LISINOPRIL 20 MG PO TABS: 20.0000 mg | ORAL_TABLET | Freq: Every day | ORAL | 1 refills | Status: DC

## 2022-09-16 ENCOUNTER — Encounter: Payer: Medicare HMO | Attending: Physical Medicine and Rehabilitation | Admitting: Physical Medicine and Rehabilitation

## 2022-09-16 VITALS — BP 156/81 | HR 50 | Ht 66.0 in | Wt 180.2 lb

## 2022-09-16 DIAGNOSIS — M48061 Spinal stenosis, lumbar region without neurogenic claudication: Secondary | ICD-10-CM | POA: Diagnosis not present

## 2022-09-16 DIAGNOSIS — M1990 Unspecified osteoarthritis, unspecified site: Secondary | ICD-10-CM

## 2022-09-16 DIAGNOSIS — E119 Type 2 diabetes mellitus without complications: Secondary | ICD-10-CM

## 2022-09-16 DIAGNOSIS — Z7409 Other reduced mobility: Secondary | ICD-10-CM

## 2022-09-16 DIAGNOSIS — Z789 Other specified health status: Secondary | ICD-10-CM | POA: Diagnosis not present

## 2022-09-16 NOTE — Patient Instructions (Signed)
Continue Tylenol as needed for pain control.  Obtain Voltaren gel over-the-counter and apply it to your back, knees for pain up to 4 times daily.  Have BMP obtained for review of your renal function.  If things look good, I will call you and give instructions on increasing the gabapentin.  If not, we will talk about medication alternatives.  No need to return for trigger point injections since they were not helpful.  No need to continue tizanidine.  I have ordered x-rays of your left knee to evaluate for arthritis, given pain and giving out.  If we see worsening arthritis, we may recommend a injection into the knee to help with that.  I will have you schedule an appointment with Dr. Letta Pate in 2 to 3 months for epidural steroid injection.  In the interim, I will send you to physical therapy, which you will need to attend to help qualify for this injection.  Follow-up with me 2 months after appointment with Dr. Letta Pate.  You can message me through [place] or call me in the interim for any additional needs, and can make appointments in between if there are any specific issues.

## 2022-09-16 NOTE — Progress Notes (Signed)
Subjective:    [name], female    DOB: 1950-11-15, 72 y.o.   MRN: [id number]  HPI   Candice Warner is a 72 y.o. year old female  who  has a past medical history of Arthritis, Depression, Hyperlipidemia, Hypertension, Hypokalemia, Hypothyroidism, Lumbar back pain, Obesity, Radiculopathy, Thyroid disease, and Type II diabetes mellitus (HCC).   They are presenting to PM&R clinic for follow up related to b/l low back and thigh pain  .  Plan from last visit: Facet arthropathy, multilevel Assessment & Plan: Prior Xrays with lower lumbar and thoracic DJD, along with mild L>R hip OA   Patient may benefit from ESI vs. MBB; difficult to determine etiology of pain on exam    Please have MRI done of your lumbar spine. I will call you with results.   Orders: -     MR LUMBAR SPINE WO CONTRAST; Future   Myofascial low back pain Assessment & Plan: Please start Tizanidine one half tablet every 8 hours as needed for back/thigh tightness. Call me in 1 week to report any side effects.    Follow up in 2 weeks for trigger point injections     Impaired mobility and ADLs Assessment & Plan: We will further discuss return to PT once your symptoms are better controlled   Orders: -     MR LUMBAR SPINE WO CONTRAST; Future   Arthritis -     MR LUMBAR SPINE WO CONTRAST; Future    Other orders -     tiZANidine HCl; Take 0.5 tablets (2 mg total) by mouth every 8 (eight) hours as needed for muscle spasms.  Dispense: 30 tablet; Refill: 2  - TPI 2/21 - LVM for patient to call office and schedule an ESI for mulilevel neuroforaminal stenosis (b/l L3/4, L4/5 with Dr. Wynn Banker) - MRI lumbar spine 08/05/22:  IMPRESSION: 1. Widespread lumbar spine degeneration. Mild levoconvex lumbar scoliosis and subtle anterolisthesis of L4 on L5. 2. Severe multifactorial spinal, lateral recess, and foraminal stenosis at L3-L4. Query L3 and/or L4 radiculitis. 3. Mild spinal stenosis at both L2-L3  and L4-L5. And moderate to severe neural foraminal stenosis also at the bilateral L2 and left L5 nerve levels. 4. Fibroid uterus. Diverticulosis of the distal large bowel.   Interval Hx:  - Therapies: none  - Follow ups: none  - Falls: C/o knees giving out, primarily left, usually when walking on a hard surface or going up steps. No Hx arthritis in knees or injections. No brace, walker, or cane.   - DME: none  - Medications:  Is taking Tizanidine when she goes to bed; tried it during the daytime but feels overall it doesn't work. No effect.  On gabapentin 300 mg BID; help "a little", no SI Takes Tylenol 2 tabs BID Her cousin gave her 2 Norco 5 mg which helped considerably.  TPI was not helpful    - Other concerns: None; denies any new incontinence. Pain worse after activity at the end of the day.  Pain is aching in her thighs and lower bac, worse when reaching upwards.   Pain Inventory Average Pain 9 Pain Right Now 6 My pain is aching  In the last 24 hours, has pain interfered with the following? General activity 8 Relation with others 8 Enjoyment of life 9 What TIME of day is your pain at its worst? evening and night Sleep (in general) Fair  Pain is worse with: walking, bending, sitting, and standing Pain improves with:  rest and medication Relief from Meds: 4  Family History  Problem Relation Age of Onset   Heart disease Mother    Heart disease Father    Breast cancer Maternal Aunt    Social History   Socioeconomic History   Marital status: Widowed    Spouse name: Not on file   Number of children: Not on file   Years of education: Not on file   Highest education level: Not on file  Occupational History   Not on file  Tobacco Use   Smoking status: Never   Smokeless tobacco: Never  Vaping Use   Vaping Use: Never used  Substance and Sexual Activity   Alcohol use: Not Currently   Drug use: Never   Sexual activity: Not Currently  Other Topics Concern   Not  on file  Social History Narrative   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  Divorced, 4 children, 1 adopted.  Husband died 04/20/2023 from colon CA.; Son murdered (hanging) Jul 21, 1999, father died Aug 05, 1999.  Lives w/ 14yo son (adopted).    Social Determinants of Health   Financial Resource Strain: Low Risk  (07/15/2022)   Overall Financial Resource Strain (CARDIA)    Difficulty of Paying Living Expenses: Not hard at all  Food Insecurity: No Food Insecurity (07/15/2022)   Hunger Vital Sign    Worried About Running Out of Food in the Last Year: Never true    Ran Out of Food in the Last Year: Never true  Transportation Needs: No Transportation Needs (07/15/2022)   PRAPARE - Administrator, Civil Service (Medical): No    Lack of Transportation (Non-Medical): No  Physical Activity: Inactive (07/15/2022)   Exercise Vital Sign    Days of Exercise per Week: 0 days    Minutes of Exercise per Session: 0 min  Stress: No Stress Concern Present (07/15/2022)   [employer] of Occupational Health - Occupational Stress Questionnaire    Feeling of Stress : Only a little  Social Connections: Moderately Isolated (07/15/2022)   Social Connection and Isolation Panel [NHANES]    Frequency of Communication with Friends and Family: More than three times a week    Frequency of Social Gatherings with Friends and Family: Three times a week    Attends Religious Services: More than 4 times per year    Active Member of Clubs or Organizations: No    Attends Banker Meetings: Never    Marital Status: Widowed   Past Surgical History:  Procedure Laterality Date   CYST REMOVAL HAND Right 11/07/2015   Procedure: EXCISION OF VOLAR RETANICULUM RIGHT HAND;  Surgeon: Loreta Ave, MD;  Location: [facility];  Service: Orthopedics;  Laterality: Right;   SHOULDER ARTHROSCOPY Left 2002   SHOULDER ARTHROSCOPY WITH ROTATOR CUFF REPAIR AND SUBACROMIAL DECOMPRESSION  07/14/2012    Procedure: SHOULDER ARTHROSCOPY WITH ROTATOR CUFF REPAIR AND SUBACROMIAL DECOMPRESSION;  Surgeon: Loreta Ave, MD;  Location: [facility];  Service: Orthopedics;  Laterality: Right;  Right Shoulder Arthroscopy, Distal Claviculectomy, Subacromial Decompression, Partial Acromioplasty with Coracoacromial Release with Arthroscopic Rotator Cuff Repair    THYROID LOBECTOMY Right    Goiter s/p right thyroidectomy   TRIGGER FINGER RELEASE Right 11/07/2015   Procedure: RELEASE TRIGGER FINGER/A-1 PULLEY RIGHT RING FINGER ;  Surgeon: Loreta Ave, MD;  Location: [facility];  Service: Orthopedics;  Laterality: Right;   TUBAL LIGATION     Past Surgical History:  Procedure Laterality Date   CYST REMOVAL HAND Right 11/07/2015   Procedure: EXCISION OF VOLAR RETANICULUM RIGHT HAND;  Surgeon: Loreta Ave, MD;  Location: [facility];  Service: Orthopedics;  Laterality: Right;   SHOULDER ARTHROSCOPY Left 2002   SHOULDER ARTHROSCOPY WITH ROTATOR CUFF REPAIR AND SUBACROMIAL DECOMPRESSION  07/14/2012   Procedure: SHOULDER ARTHROSCOPY WITH ROTATOR CUFF REPAIR AND SUBACROMIAL DECOMPRESSION;  Surgeon: Loreta Ave, MD;  Location: [facility];  Service: Orthopedics;  Laterality: Right;  Right Shoulder Arthroscopy, Distal Claviculectomy, Subacromial Decompression, Partial Acromioplasty with Coracoacromial Release with Arthroscopic Rotator Cuff Repair    THYROID LOBECTOMY Right    Goiter s/p right thyroidectomy   TRIGGER FINGER RELEASE Right 11/07/2015   Procedure: RELEASE TRIGGER FINGER/A-1 PULLEY RIGHT RING FINGER ;  Surgeon: Loreta Ave, MD;  Location: [facility];  Service: Orthopedics;  Laterality: Right;   TUBAL LIGATION     Past Medical History:  Diagnosis Date   Arthritis    "right hip" (06/27/2018)   Depression    Hyperlipidemia    Hypertension    Hypokalemia    Hypothyroidism    Lumbar back pain    Obesity     Radiculopathy    Thyroid disease    Type II diabetes mellitus (HCC)    BP (!) 160/84   Pulse (!) 50   Ht 5\' 6"  (1.676 m)   Wt 180 lb 3.2 oz (81.7 kg)   SpO2 98%   BMI 29.09 kg/m   Opioid Risk Score:   Fall Risk Score:  `1  Depression screen [facility] 2/9     08/05/2022    9:48 AM 07/23/2022    1:48 PM 07/22/2022   10:03 AM 07/15/2022    2:43 PM 05/14/2022   10:57 AM 04/10/2022    9:24 AM 03/10/2022   10:02 AM  Depression screen PHQ 2/9  Decreased Interest 0 1 1 1 1 1 1   Down, Depressed, Hopeless 0 0 0 1 1 0 0  PHQ - 2 Score 0 1 1 2 2 1 1   Altered sleeping  2 2 0 0 0 1  Tired, decreased energy  1 1 1 1 1 1   Change in appetite  0 0 0 0 0 0  Feeling bad or failure about yourself   0 0 0 0 0 0  Trouble concentrating  0 0 0 0 0 0  Moving slowly or fidgety/restless  0 0 0 0 0 0  Suicidal thoughts  0 0 0 0 0 0  PHQ-9 Score  4 4 3 3 2 3   Difficult doing work/chores  Somewhat difficult   Somewhat difficult  Somewhat difficult     Review of Systems  Musculoskeletal:  Positive for back pain.       Bilateral hamstring pain Bilateral thigh pain  All other systems reviewed and are negative.     Objective:   Physical Exam   PE: Constitution: Appropriate appearance for age. No apparent distress   Resp: No respiratory distress. No accessory muscle usage. on RA Cardio: Well perfused appearance. No peripheral edema. Abdomen: Nondistended. Nontender.   Psych: Appropriate mood and affect. Neuro: AAOx4. No apparent cognitive deficits   Neurologic Exam:   DTRs: Reflexes were 2+ in bilateral achilles, patella, biceps, BR and triceps. Babinsky: flexor responses b/l.   Hoffmans: negative b/l Sensory exam: revealed normal sensation in all dermatomal regions in bilateral lower extremities Motor exam: strength 5/5 throughout bilateral lower extremities and with exception  of 5-/5 in bilateral hip flexion limited by low back pain Coordination: Fine motor coordination was normal.   Gait:  normal     Back Exam:   Inspection: Pelvis was  even.  Lumbar lordotic curvature was  reduced .  There was  no evidence of scoliosis.  Palpation: Palpatory exam revealed ttp at the bilateral PSIS, R SI joint, and lumbar paraspinals . There was no evidence of spasm.   ROM revealed restricted ROM in extension of the back . Special/provocative testing:    SLR: -   Slump test: -    Facet loading: + R, not L(very non-specific)   TTP at paraspinals: Mild (sensitive for facet pain...if no ttp then likely not facet pain)        Assessment & Plan:   Candice Warner is a 72 y.o. year old female  who  has a past medical history of Arthritis, Depression, Hyperlipidemia, Hypertension, Hypokalemia, Hypothyroidism, Lumbar back pain, Obesity, Radiculopathy, Thyroid disease, and Type II diabetes mellitus (HCC).   They are presenting to PM&R clinic for follow up related to b/l low back and thigh pain  .  Neuroforaminal stenosis of lumbar spine -     Ambulatory referral to Physical Therapy Continue Tylenol as needed for pain control.  Obtain Voltaren gel over-the-counter and apply it to your back, knees for pain up to 4 times daily. I will have you schedule an appointment with Dr. Wynn Banker in 2 to 3 months for epidural steroid injection.  Follow-up with me 2 months after appointment with Dr. Wynn Banker.  You can message me through MyChart or call me in the interim for any additional needs, and can make appointments in between if there are any specific issues.  Impaired mobility and ADLs -     Ambulatory referral to Physical Therapy PT referral for mobilization and pain control; if fails, will set up ESI as above No need to return for trigger point injections since they were not helpful.  No need to continue tizanidine.  Arthritis -     DG Knee 1-2 Views Left; Future -     Ambulatory referral to Physical Therapy Mild arthritis is seen in L knee xr 2017  I have ordered x-rays of your left knee to  evaluate for arthritis, given pain and giving out.  If we see worsening arthritis, we may recommend a injection into the knee to help with that.   Type 2 diabetes mellitus without complication, without long-term current use of insulin -     Basic metabolic panel  Have BMP obtained for review of your renal function.  If things look good, I will call you and give instructions on increasing the gabapentin.  If not, we will talk about medication alternatives.

## 2022-09-17 DIAGNOSIS — E119 Type 2 diabetes mellitus without complications: Secondary | ICD-10-CM | POA: Diagnosis not present

## 2022-09-17 MED ORDER — LISINOPRIL 20 MG PO TABS: 20.0000 mg | ORAL_TABLET | Freq: Every day | ORAL | 1 refills | Status: DC

## 2022-09-17 NOTE — Addendum Note (Signed)
Addended by: Talbot Grumbling on: 09/17/2022 09:55 AM   Modules accepted: Orders

## 2022-09-17 NOTE — Telephone Encounter (Signed)
Patient calls nurse line regarding lisinopril refill. She states that she has not had medication in one week.   Per chart review, prescription was ordered as "no print." Resent electronically to patient's pharmacy.   Receipt confirmed by pharmacy at [phone].  Talbot Grumbling, RN

## 2022-09-18 LAB — BASIC METABOLIC PANEL
BUN/Creatinine Ratio: 15 (ref 12–28)
BUN: 15 mg/dL (ref 8–27)
CO2: 22 mmol/L (ref 20–29)
Calcium: 9 mg/dL (ref 8.7–10.3)
Chloride: 108 mmol/L — ABNORMAL HIGH (ref 96–106)
Creatinine, Ser: 0.97 mg/dL (ref 0.57–1.00)
Glucose: 103 mg/dL — ABNORMAL HIGH (ref 70–99)
Potassium: 3.8 mmol/L (ref 3.5–5.2)
Sodium: 144 mmol/L (ref 134–144)
eGFR: 62 mL/min/{1.73_m2} (ref >59)

## 2022-09-22 ENCOUNTER — Telehealth: Payer: Self-pay | Admitting: Physical Medicine and Rehabilitation

## 2022-09-24 NOTE — Telephone Encounter (Signed)
Have called patient twice with no answer, no identifier on voicemail. If call back, inform she is able to increase gabapentin to 300 mg three times daily.

## 2022-09-30 NOTE — Therapy (Signed)
OUTPATIENT PHYSICAL THERAPY THORACOLUMBAR EVALUATION   Patient Name: Candice Warner MRN: [id number] DOB:[birth year], 72 y.o., female Today's Date: 10/01/2022  END OF SESSION:  PT End of Session - 10/01/22 1220     Visit Number 1    Number of Visits 9    Date for PT Re-Evaluation 11/30/22    Authorization Type [employer]na Medicare    PT Start Time 1103    PT Stop Time 1145    PT Time Calculation (min) 42 min    Activity Tolerance Patient tolerated treatment well;Patient limited by pain    Behavior During Therapy [facility] for tasks assessed/performed             Past Medical History:  Diagnosis Date   Arthritis    "right hip" (06/27/2018)   Depression    Hyperlipidemia    Hypertension    Hypokalemia    Hypothyroidism    Lumbar back pain    Obesity    Radiculopathy    Thyroid disease    Type II diabetes mellitus    Past Surgical History:  Procedure Laterality Date   CYST REMOVAL HAND Right 11/07/2015   Procedure: EXCISION OF VOLAR RETANICULUM RIGHT HAND;  Surgeon: Loreta Ave, MD;  Location: [facility];  Service: Orthopedics;  Laterality: Right;   SHOULDER ARTHROSCOPY Left 2002   SHOULDER ARTHROSCOPY WITH ROTATOR CUFF REPAIR AND SUBACROMIAL DECOMPRESSION  07/14/2012   Procedure: SHOULDER ARTHROSCOPY WITH ROTATOR CUFF REPAIR AND SUBACROMIAL DECOMPRESSION;  Surgeon: Loreta Ave, MD;  Location: [facility];  Service: Orthopedics;  Laterality: Right;  Right Shoulder Arthroscopy, Distal Claviculectomy, Subacromial Decompression, Partial Acromioplasty with Coracoacromial Release with Arthroscopic Rotator Cuff Repair    THYROID LOBECTOMY Right    Goiter s/p right thyroidectomy   TRIGGER FINGER RELEASE Right 11/07/2015   Procedure: RELEASE TRIGGER FINGER/A-1 PULLEY RIGHT RING FINGER ;  Surgeon: Loreta Ave, MD;  Location: [facility];  Service: Orthopedics;  Laterality: Right;   TUBAL LIGATION     Patient Active Problem List    Diagnosis Date Noted   Myofascial low back pain 07/27/2022   Impaired mobility and ADLs 07/27/2022   Thigh pain 02/25/2022   Calcification of left breast 06/26/2019   Osteoarthritis 03/15/2019   Nodule of chest wall 06/21/2015   Left trigger finger 10/23/2014   Right hip pain 06/01/2011   HLD (hyperlipidemia) 04/16/2009   Type 2 diabetes mellitus without complication, without long-term current use of insulin 10/29/2008   Facet arthropathy, multilevel 10/29/2008   Hypothyroidism 08/12/2006   OBESITY, NOS 08/12/2006   Major depressive disorder, recurrent episode 08/12/2006   HYPERTENSION, BENIGN SYSTEMIC 08/12/2006   DIVERTICULOSIS OF COLON 08/12/2006    PCP: Elberta Fortis, MD  REFERRING PROVIDER: Angelina Sheriff, DO   REFERRING DIAG: [phone] (ICD-10-CM) - Neuroforaminal stenosis of lumbar spine Z74.09,Z78.9 (ICD-10-CM) - Impaired mobility and ADLs M19.90 (ICD-10-CM) - Arthritis    Rationale for Evaluation and Treatment: Rehabilitation  THERAPY DIAG:  Other low back pain  Pain in right leg  Pain in left leg  Other abnormalities of gait and mobility  Muscle weakness (generalized)  ONSET DATE: 09/16/2022  SUBJECTIVE:  SUBJECTIVE STATEMENT: Reports legs hurt all the time. Got nerve cream from [facility] and rubs it all on her legs and it helps. Reports it is both legs, "it's like a hurt". No numbness or tingling. Pain is just above the knees. Feels like knees could give out. This has been going on for a couple years. Also has low back pain - "its like a hurt pain, same in the legs". Can't lift her arms too much over her head or she will hurt. Got PT for this same issue last July and reports that it did not help. Reports that it helped when they massaged her legs a lot. When she does her  previous PT exercises for stretching, it makes it hurt worse. Can't walk too far. Used to love to go to [place] and walk for 2 hrs, but can't do that anymore.   PERTINENT HISTORY:  PMH: Arthritis, Depression, HLD,HTN, Hypothyroidism, Radiculopathy, Type II Diabetes   PAIN:  Are you having pain? Yes: NPRS scale: 8/10 Pain location: Back of legs, lower part of back Pain description: Aching Aggravating factors: Sitting for too long, mornings when she gets up   Relieving factors: Nerve cream   PRECAUTIONS: None  WEIGHT BEARING RESTRICTIONS: No  FALLS:  Has patient fallen in last 6 months? 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   NEXT MD VISIT: 11/17/22 with Dr. Wynn Banker   OBJECTIVE:   DIAGNOSTIC FINDINGS:  MRI lumbar spine 08/07/22: IMPRESSION: 1. Widespread lumbar spine degeneration. Mild levoconvex lumbar scoliosis and subtle anterolisthesis of L4 on L5. 2. Severe multifactorial spinal, lateral recess, and foraminal stenosis at L3-L4. Query L3 and/or L4 radiculitis. 3. Mild spinal stenosis at both L2-L3 and L4-L5. And moderate to severe neural foraminal stenosis also at the bilateral L2 and left L5 nerve levels. 4. Fibroid uterus. Diverticulosis of the distal large bowel.  X-ray of hip:  IMPRESSION: Mild-to-moderate degenerative joint changes of the lumbar spine and lower thoracic spine, advanced compared to prior exam.   Mild degenerative joint changes of bilateral hips, left greater than right.  PATIENT SURVEYS:  Modified Oswestry  : 18/50 = Moderate disability, (pt took incr time filling out during eval)   SCREENING FOR RED FLAGS: Bowel or bladder incontinence: No Spinal tumors:  No Cauda equina syndrome: No Compression fracture: No Abdominal aneurysm: No  COGNITION: Overall cognitive status: Within functional limits for tasks assessed     SENSATION: Light touch: WFL   POSTURE: rounded shoulders, forward head, and posterior pelvic tilt  PALPATION: TTP lumbar paraspinals, bilateral PSIS, central L5 and hypomobility with pt reporting incr pain   LUMBAR ROM:   AROM eval  Flexion Can reach down to mid shin, bends knees and has incr tightness in back  Extension 75% limited, incr pain   Right lateral flexion WFL, incr pain in back  Left lateral flexion WFL, incr pain in back  Right rotation WFL, back hurts  Left rotation WFL, back hurts   (Blank rows = not tested)  LOWER EXTREMITY ROM:     Active  Right eval Left eval  Hip flexion Approx. 110 deg, incr tightness  Approx. 110 deg, incr tightness   Hip extension    Hip abduction  Hip adduction    Hip internal rotation Significantly limited with incr discomfort Significantly limited with incr discomfort  Hip external rotation [facility]  Knee flexion    Knee extension    Ankle dorsiflexion    Ankle plantarflexion    Ankle inversion    Ankle eversion     (Blank rows = not tested)  LOWER EXTREMITY MMT:    MMT Right eval Left eval  Hip flexion 3- (incr pain)  3- (incr pain)  Hip extension    Hip abduction 4 4  Hip adduction 4+ 4+  Hip internal rotation    Hip external rotation    Knee flexion 4+ 4+  Knee extension 4+ 4+  Ankle dorsiflexion 5 5  Ankle plantarflexion    Ankle inversion    Ankle eversion     (Blank rows = not tested)  All tested in sitting   LUMBAR SPECIAL TESTS:  Straight leg raise test: Negative, pt just feeling tightness in hamstrings  and Slump test: Negative, pt just feeling tightness in quad and hamstrings  Prone knee bend: approx. 90 degrees bilat, incr tightness   FUNCTIONAL TESTS:  5 times sit to stand: 19.22 seconds with no UE support, pt reporting 7/10 low  back pain   GAIT: Distance walked: Clinic distances Assistive device utilized: None Level of assistance: Modified independence Comments: Decr trunk rotation   TODAY'S TREATMENT:                                                                                                                              N/A During eval.    PATIENT EDUCATION:  Education details: Clinical findings, POC, aquatic therapy for pain/stiffness/weakness (pt in agreement with this and wanting to do this) Person educated: Patient Education method: Explanation Education comprehension: verbalized understanding  HOME EXERCISE PROGRAM: Will provide at next session   ASSESSMENT:  CLINICAL IMPRESSION: Patient is a 72 year old female referred to Neuro OPPT for back pain.   Pt's PMH is significant for: Arthritis, Depression, HLD,HTN, Hypothyroidism, Radiculopathy, Type II Diabetes. The following deficits were present during the exam: decr strength, low back and leg pain, decr flexibility/AROM, postural abnormalities. Based ODI, pt's score indicates moderate disability with back pain. Pt would benefit from skilled PT to address these impairments and functional limitations to maximize functional mobility independence and decr low back/leg pain.    OBJECTIVE IMPAIRMENTS: decreased activity tolerance, decreased endurance, decreased mobility, difficulty walking, decreased ROM, decreased strength, hypomobility, increased muscle spasms, impaired flexibility, postural dysfunction, and pain.   ACTIVITY LIMITATIONS: lifting, bending, squatting, stairs, and locomotion level  PARTICIPATION LIMITATIONS: community activity  PERSONAL FACTORS: Age, Behavior pattern, Past/current experiences, Time since onset of injury/illness/exacerbation, and 3+ comorbidities: Arthritis, Depression, HLD,HTN, Hypothyroidism, Radiculopathy, Type II Diabetes   are also affecting patient's functional outcome.   REHAB POTENTIAL: Good  CLINICAL DECISION  MAKING: Stable/uncomplicated  EVALUATION COMPLEXITY: Low   GOALS: Goals reviewed with patient? Yes  SHORT TERM  GOALS: ALL STGS = LTGS  LONG TERM GOALS: Target date: 10/29/2022  Pt will be independent with final HEP for low back pain, flexibility, and stretches in order to build upon functional gains made in therapy. Baseline:  Goal status: INITIAL  2.  Pt will initiate aquatic therapy. Baseline:  Goal status: INITIAL  3.  Pt will perform 5x sit <> stand to 16 seconds or less with 4/10 or less back pain in order to demo improved functional mobility.  Baseline: 19.22 seconds with no UE support, pt reporting 7/10 low back pain Goal status: INITIAL  4.  Pt will improve ODI to 12/50 or less in order to demo improved functional outcomes with back pain.  Baseline: 18/50 = Moderate disability, (pt took incr time filling out during eval) Goal status: INITIAL   PLAN:  PT FREQUENCY: 2x/week  PT DURATION: 8 weeks - anticipate only ~4 weeks   PLANNED INTERVENTIONS: Therapeutic exercises, Therapeutic activity, Neuromuscular re-education, Balance training, Gait training, Patient/Family education, Self Care, Joint mobilization, Aquatic Therapy, Dry Needling, Spinal mobilization, Manual therapy, and Re-evaluation.  PLAN FOR NEXT SESSION: Initial HEP for hip strength, ROM AND STRETCHING!!! Sci[employer], any update about pool therapy??    Drake Leach, PT, DPT  10/01/2022, 12:21 PM

## 2022-10-01 ENCOUNTER — Ambulatory Visit: Payer: Medicare HMO | Attending: Physical Medicine and Rehabilitation | Admitting: Physical Therapy

## 2022-10-01 ENCOUNTER — Encounter: Payer: Self-pay | Admitting: Physical Therapy

## 2022-10-01 DIAGNOSIS — M48061 Spinal stenosis, lumbar region without neurogenic claudication: Secondary | ICD-10-CM | POA: Diagnosis not present

## 2022-10-01 DIAGNOSIS — M79605 Pain in left leg: Secondary | ICD-10-CM | POA: Insufficient documentation

## 2022-10-01 DIAGNOSIS — M79604 Pain in right leg: Secondary | ICD-10-CM | POA: Insufficient documentation

## 2022-10-01 DIAGNOSIS — M1990 Unspecified osteoarthritis, unspecified site: Secondary | ICD-10-CM | POA: Insufficient documentation

## 2022-10-01 DIAGNOSIS — M6281 Muscle weakness (generalized): Secondary | ICD-10-CM | POA: Diagnosis not present

## 2022-10-01 DIAGNOSIS — Z7409 Other reduced mobility: Secondary | ICD-10-CM | POA: Insufficient documentation

## 2022-10-01 DIAGNOSIS — Z789 Other specified health status: Secondary | ICD-10-CM | POA: Insufficient documentation

## 2022-10-01 DIAGNOSIS — R2689 Other abnormalities of gait and mobility: Secondary | ICD-10-CM

## 2022-10-01 DIAGNOSIS — M5459 Other low back pain: Secondary | ICD-10-CM | POA: Diagnosis not present

## 2022-10-07 ENCOUNTER — Ambulatory Visit: Payer: Medicare HMO | Admitting: Physical Therapy

## 2022-10-07 DIAGNOSIS — M79604 Pain in right leg: Secondary | ICD-10-CM | POA: Diagnosis not present

## 2022-10-07 DIAGNOSIS — M79605 Pain in left leg: Secondary | ICD-10-CM | POA: Diagnosis not present

## 2022-10-07 DIAGNOSIS — M5459 Other low back pain: Secondary | ICD-10-CM

## 2022-10-07 DIAGNOSIS — M6281 Muscle weakness (generalized): Secondary | ICD-10-CM | POA: Diagnosis not present

## 2022-10-07 DIAGNOSIS — M1990 Unspecified osteoarthritis, unspecified site: Secondary | ICD-10-CM | POA: Diagnosis not present

## 2022-10-07 DIAGNOSIS — R2689 Other abnormalities of gait and mobility: Secondary | ICD-10-CM | POA: Diagnosis not present

## 2022-10-07 DIAGNOSIS — Z7409 Other reduced mobility: Secondary | ICD-10-CM | POA: Diagnosis not present

## 2022-10-07 DIAGNOSIS — Z789 Other specified health status: Secondary | ICD-10-CM | POA: Diagnosis not present

## 2022-10-07 DIAGNOSIS — M48061 Spinal stenosis, lumbar region without neurogenic claudication: Secondary | ICD-10-CM | POA: Diagnosis not present

## 2022-10-07 NOTE — Therapy (Signed)
OUTPATIENT PHYSICAL THERAPY THORACOLUMBAR TREATMENT   Patient Name: Candice Warner MRN: [id number] DOB:November 23, 1950, 72 y.o., female Today's Date: 10/07/2022  END OF SESSION:  PT End of Session - 10/07/22 1316     Visit Number 2    Number of Visits 9    Date for PT Re-Evaluation 11/30/22    Authorization Type Aetna Medicare    PT Start Time 1315    PT Stop Time 1355    PT Time Calculation (min) 40 min    Activity Tolerance Patient tolerated treatment well    Behavior During Therapy [facility] for tasks assessed/performed              Past Medical History:  Diagnosis Date   Arthritis    "right hip" (06/27/2018)   Depression    Hyperlipidemia    Hypertension    Hypokalemia    Hypothyroidism    Lumbar back pain    Obesity    Radiculopathy    Thyroid disease    Type II diabetes mellitus    Past Surgical History:  Procedure Laterality Date   CYST REMOVAL HAND Right 11/07/2015   Procedure: EXCISION OF VOLAR RETANICULUM RIGHT HAND;  Surgeon: Loreta Ave, MD;  Location: [facility];  Service: Orthopedics;  Laterality: Right;   SHOULDER ARTHROSCOPY Left 2002   SHOULDER ARTHROSCOPY WITH ROTATOR CUFF REPAIR AND SUBACROMIAL DECOMPRESSION  07/14/2012   Procedure: SHOULDER ARTHROSCOPY WITH ROTATOR CUFF REPAIR AND SUBACROMIAL DECOMPRESSION;  Surgeon: Loreta Ave, MD;  Location: [facility];  Service: Orthopedics;  Laterality: Right;  Right Shoulder Arthroscopy, Distal Claviculectomy, Subacromial Decompression, Partial Acromioplasty with Coracoacromial Release with Arthroscopic Rotator Cuff Repair    THYROID LOBECTOMY Right    Goiter s/p right thyroidectomy   TRIGGER FINGER RELEASE Right 11/07/2015   Procedure: RELEASE TRIGGER FINGER/A-1 PULLEY RIGHT RING FINGER ;  Surgeon: Loreta Ave, MD;  Location: [facility];  Service: Orthopedics;  Laterality: Right;   TUBAL LIGATION     Patient Active Problem List   Diagnosis Date Noted    Myofascial low back pain 07/27/2022   Impaired mobility and ADLs 07/27/2022   Thigh pain 02/25/2022   Calcification of left breast 06/26/2019   Osteoarthritis 03/15/2019   Nodule of chest wall 06/21/2015   Left trigger finger 10/23/2014   Right hip pain 06/01/2011   HLD (hyperlipidemia) 04/16/2009   Type 2 diabetes mellitus without complication, without long-term current use of insulin 10/29/2008   Facet arthropathy, multilevel 10/29/2008   Hypothyroidism 08/12/2006   OBESITY, NOS 08/12/2006   Major depressive disorder, recurrent episode 08/12/2006   HYPERTENSION, BENIGN SYSTEMIC 08/12/2006   DIVERTICULOSIS OF COLON 08/12/2006    PCP: Elberta Fortis, MD  REFERRING PROVIDER: Angelina Sheriff, DO   REFERRING DIAG: [phone] (ICD-10-CM) - Neuroforaminal stenosis of lumbar spine Z74.09,Z78.9 (ICD-10-CM) - Impaired mobility and ADLs M19.90 (ICD-10-CM) - Arthritis    Rationale for Evaluation and Treatment: Rehabilitation  THERAPY DIAG:  Other low back pain  Pain in right leg  Pain in left leg  Other abnormalities of gait and mobility  Muscle weakness (generalized)  ONSET DATE: 09/16/2022  SUBJECTIVE:  SUBJECTIVE STATEMENT: Pt reports that her pain has been doing ok since last time except for yesterday when walking her RLE gave out and she had tingling pain all the way down her leg. Pt reports that her pain got better after standing for a few min, that was the first time that has happened. Today pt rates her back pain 9/10 "soreness", 8/10 pain in BLE "its like a hurt", just in the thighs except for yesterday when it went all the way down RLE.  PERTINENT HISTORY:  PMH: Arthritis, Depression, HLD,HTN, Hypothyroidism, Radiculopathy, Type II Diabetes   PAIN:  Are you having pain? Yes: NPRS  scale: 9/10 Pain location: Back of legs, lower part of back Pain description: Aching Aggravating factors: Sitting for too long, mornings when she gets up   Relieving factors: Nerve cream   PRECAUTIONS: None  WEIGHT BEARING RESTRICTIONS: No  FALLS:  Has patient fallen in last 6 months? 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   NEXT MD VISIT: 11/17/22 with Dr. Wynn Banker   OBJECTIVE:   DIAGNOSTIC FINDINGS:  MRI lumbar spine 08/07/22: IMPRESSION: 1. Widespread lumbar spine degeneration. Mild levoconvex lumbar scoliosis and subtle anterolisthesis of L4 on L5. 2. Severe multifactorial spinal, lateral recess, and foraminal stenosis at L3-L4. Query L3 and/or L4 radiculitis. 3. Mild spinal stenosis at both L2-L3 and L4-L5. And moderate to severe neural foraminal stenosis also at the bilateral L2 and left L5 nerve levels. 4. Fibroid uterus. Diverticulosis of the distal large bowel.  X-ray of hip:  IMPRESSION: Mild-to-moderate degenerative joint changes of the lumbar spine and lower thoracic spine, advanced compared to prior exam.   Mild degenerative joint changes of bilateral hips, left greater than right.  PATIENT SURVEYS:  Modified Oswestry  : 18/50 = Moderate disability, (pt took incr time filling out during eval)   SCREENING FOR RED FLAGS: Bowel or bladder incontinence: No Spinal tumors: No Cauda equina syndrome: No Compression fracture: No Abdominal aneurysm: No  COGNITION: Overall cognitive status: Within functional limits for tasks assessed     SENSATION: Light touch: WFL   POSTURE: rounded shoulders, forward head, and posterior pelvic tilt  PALPATION: TTP lumbar paraspinals,  bilateral PSIS, central L5 and hypomobility with pt reporting incr pain   LUMBAR ROM:   AROM eval  Flexion Can reach down to mid shin, bends knees and has incr tightness in back  Extension 75% limited, incr pain   Right lateral flexion WFL, incr pain in back  Left lateral flexion WFL, incr pain in back  Right rotation WFL, back hurts  Left rotation WFL, back hurts   (Blank rows = not tested)  LOWER EXTREMITY ROM:     Active  Right eval Left eval  Hip flexion Approx. 110 deg, incr tightness  Approx. 110 deg, incr tightness   Hip extension    Hip abduction    Hip adduction    Hip internal rotation Significantly limited with incr discomfort Significantly limited with incr discomfort  Hip external rotation [facility]  Knee flexion    Knee extension    Ankle dorsiflexion    Ankle plantarflexion    Ankle inversion    Ankle eversion     (Blank rows = not tested)  LOWER EXTREMITY MMT:    MMT Right eval Left eval  Hip flexion 3- (incr pain)  3- (incr pain)  Hip extension    Hip abduction 4 4  Hip adduction 4+ 4+  Hip internal rotation    Hip external rotation    Knee flexion 4+ 4+  Knee extension 4+ 4+  Ankle dorsiflexion 5 5  Ankle plantarflexion    Ankle inversion    Ankle eversion     (Blank rows = not tested)  All tested in sitting   LUMBAR SPECIAL TESTS:  Straight leg raise test: Negative, pt just feeling tightness in hamstrings  and Slump test: Negative, pt just feeling tightness in quad and hamstrings  Prone knee bend: approx. 90 degrees bilat, incr tightness   FUNCTIONAL TESTS:  5 times sit to stand: 19.22 seconds with no UE support, pt reporting 7/10 low back pain   GAIT: Distance walked: Clinic distances Assistive device utilized: None Level of assistance: Modified independence Comments: Decr trunk rotation   TODAY'S TREATMENT:                                                                                                                               TherEx Seated anterior leans on therapy ball 5 x 30 sec each Seated anteriorlateral leans on therapy ball 5 x 30 sec each direction  Supine posterior pelvic tilt 2 x 10 reps with 5 sec hold Supine bridges 2 x 10 reps Supine LTR 2 x 10 reps each direction Supine SKTC 3 x 30 sec each B  Seated HS stretch 3 x 30 sec each on BLE  Added appropriate exercises to HEP, see bolded below   PATIENT EDUCATION:  Education details: initiated HEP Person educated: Patient Education method: Chief Technology Officer Education comprehension: verbalized understanding  HOME EXERCISE PROGRAM: Access Code: [email or web address] URL: [email or web address] Date: 10/07/2022 Prepared by: Peter Congo  Exercises - Supine Posterior Pelvic Tilt  - 1 x daily - 7 x weekly - 3 sets - 10 reps - 5 sec hold - Supine Lower Trunk Rotation  - 1 x daily - 7 x weekly - 3 sets - 10 reps - Seated Flexion Stretch with Swiss Ball  - 1 x daily - 7 x weekly - 1 sets - 5 reps - 30 sec hold - Seated Thoracic Flexion and Rotation with Swiss Ball  - 1 x daily - 7 x weekly - 1 sets - 15 reps - 30 sec hold  ASSESSMENT:  CLINICAL IMPRESSION: Emphasis of skilled PT session on initiating HEP for core stability and low back stretching. Pt with no increase in pain with any exercises given this date and feels comfortable performing assigned exercises at home. Pt continues to benefit from skilled therapy services to address ongoing pain and to work towards improved symptom management. Continue POC.   OBJECTIVE IMPAIRMENTS: decreased activity tolerance, decreased endurance, decreased mobility, difficulty walking, decreased ROM, decreased strength, hypomobility, increased muscle spasms, impaired  flexibility, postural dysfunction, and pain.   ACTIVITY LIMITATIONS: lifting, bending, squatting, stairs, and locomotion level  PARTICIPATION LIMITATIONS: community activity  PERSONAL FACTORS: Age, Behavior pattern, Past/current  experiences, Time since onset of injury/illness/exacerbation, and 3+ comorbidities: Arthritis, Depression, HLD,HTN, Hypothyroidism, Radiculopathy, Type II Diabetes   are also affecting patient's functional outcome.   REHAB POTENTIAL: Good  CLINICAL DECISION MAKING: Stable/uncomplicated  EVALUATION COMPLEXITY: Low   GOALS: Goals reviewed with patient? Yes  SHORT TERM GOALS: ALL STGS = LTGS  LONG TERM GOALS: Target date: 10/29/2022  Pt will be independent with final HEP for low back pain, flexibility, and stretches in order to build upon functional gains made in therapy. Baseline:  Goal status: INITIAL  2.  Pt will initiate aquatic therapy. Baseline:  Goal status: INITIAL  3.  Pt will perform 5x sit <> stand to 16 seconds or less with 4/10 or less back pain in order to demo improved functional mobility.  Baseline: 19.22 seconds with no UE support, pt reporting 7/10 low back pain Goal status: INITIAL  4.  Pt will improve ODI to 12/50 or less in order to demo improved functional outcomes with back pain.  Baseline: 18/50 = Moderate disability, (pt took incr time filling out during eval) Goal status: INITIAL   PLAN:  PT FREQUENCY: 2x/week  PT DURATION: 8 weeks - anticipate only ~4 weeks   PLANNED INTERVENTIONS: Therapeutic exercises, Therapeutic activity, Neuromuscular re-education, Balance training, Gait training, Patient/Family education, Self Care, Joint mobilization, Aquatic Therapy, Dry Needling, Spinal mobilization, Manual therapy, and Re-evaluation.  PLAN FOR NEXT SESSION: add to HEP for hip strength, ROM AND STRETCHING!!! Sci[employer], any update about pool therapy??    Peter Congo, PT, DPT, CSRS  10/07/2022, 1:57 PM

## 2022-10-09 ENCOUNTER — Ambulatory Visit: Payer: Medicare HMO | Admitting: Physical Therapy

## 2022-10-09 DIAGNOSIS — Z7409 Other reduced mobility: Secondary | ICD-10-CM | POA: Diagnosis not present

## 2022-10-09 DIAGNOSIS — M79605 Pain in left leg: Secondary | ICD-10-CM

## 2022-10-09 DIAGNOSIS — M79604 Pain in right leg: Secondary | ICD-10-CM | POA: Diagnosis not present

## 2022-10-09 DIAGNOSIS — M1990 Unspecified osteoarthritis, unspecified site: Secondary | ICD-10-CM | POA: Diagnosis not present

## 2022-10-09 DIAGNOSIS — R2689 Other abnormalities of gait and mobility: Secondary | ICD-10-CM | POA: Diagnosis not present

## 2022-10-09 DIAGNOSIS — M48061 Spinal stenosis, lumbar region without neurogenic claudication: Secondary | ICD-10-CM | POA: Diagnosis not present

## 2022-10-09 DIAGNOSIS — M6281 Muscle weakness (generalized): Secondary | ICD-10-CM | POA: Diagnosis not present

## 2022-10-09 DIAGNOSIS — M5459 Other low back pain: Secondary | ICD-10-CM

## 2022-10-09 DIAGNOSIS — Z789 Other specified health status: Secondary | ICD-10-CM | POA: Diagnosis not present

## 2022-10-09 NOTE — Therapy (Signed)
OUTPATIENT PHYSICAL THERAPY THORACOLUMBAR TREATMENT   Patient Name: Candice Warner MRN: [id number] DOB:27-Jan-1951, 72 y.o., female Today's Date: 10/09/2022  END OF SESSION:  PT End of Session - 10/09/22 1103     Visit Number 3    Number of Visits 9    Date for PT Re-Evaluation 11/30/22    Authorization Type [employer]    PT Start Time 1102    PT Stop Time 1144    PT Time Calculation (min) 42 min    Activity Tolerance Patient tolerated treatment well    Behavior During Therapy WFL for tasks assessed/performed               Past Medical History:  Diagnosis Date   Arthritis    "right hip" (06/27/2018)   Depression    Hyperlipidemia    Hypertension    Hypokalemia    Hypothyroidism    Lumbar back pain    Obesity    Radiculopathy    Thyroid disease    Type II diabetes mellitus (HCC)    Past Surgical History:  Procedure Laterality Date   CYST REMOVAL HAND Right 11/07/2015   Procedure: EXCISION OF VOLAR RETANICULUM RIGHT HAND;  Surgeon: Loreta Ave, MD;  Location: [facility];  Service: Orthopedics;  Laterality: Right;   SHOULDER ARTHROSCOPY Left 2002   SHOULDER ARTHROSCOPY WITH ROTATOR CUFF REPAIR AND SUBACROMIAL DECOMPRESSION  07/14/2012   Procedure: SHOULDER ARTHROSCOPY WITH ROTATOR CUFF REPAIR AND SUBACROMIAL DECOMPRESSION;  Surgeon: Loreta Ave, MD;  Location: [facility];  Service: Orthopedics;  Laterality: Right;  Right Shoulder Arthroscopy, Distal Claviculectomy, Subacromial Decompression, Partial Acromioplasty with Coracoacromial Release with Arthroscopic Rotator Cuff Repair    THYROID LOBECTOMY Right    Goiter s/p right thyroidectomy   TRIGGER FINGER RELEASE Right 11/07/2015   Procedure: RELEASE TRIGGER FINGER/A-1 PULLEY RIGHT RING FINGER ;  Surgeon: Loreta Ave, MD;  Location: [facility];  Service: Orthopedics;  Laterality: Right;   TUBAL LIGATION     Patient Active Problem List   Diagnosis Date Noted    Myofascial low back pain 07/27/2022   Impaired mobility and ADLs 07/27/2022   Thigh pain 02/25/2022   Calcification of left breast 06/26/2019   Osteoarthritis 03/15/2019   Nodule of chest wall 06/21/2015   Left trigger finger 10/23/2014   Right hip pain 06/01/2011   HLD (hyperlipidemia) 04/16/2009   Type 2 diabetes mellitus without complication, without long-term current use of insulin (HCC) 10/29/2008   Facet arthropathy, multilevel 10/29/2008   Hypothyroidism 08/12/2006   OBESITY, NOS 08/12/2006   Major depressive disorder, recurrent episode (HCC) 08/12/2006   HYPERTENSION, BENIGN SYSTEMIC 08/12/2006   DIVERTICULOSIS OF COLON 08/12/2006    PCP: Elberta Fortis, MD  REFERRING PROVIDER: Angelina Sheriff, DO   REFERRING DIAG: [phone] (ICD-10-CM) - Neuroforaminal stenosis of lumbar spine Z74.09,Z78.9 (ICD-10-CM) - Impaired mobility and ADLs M19.90 (ICD-10-CM) - Arthritis    Rationale for Evaluation and Treatment: Rehabilitation  THERAPY DIAG:  Other low back pain  Pain in right leg  Pain in left leg  Other abnormalities of gait and mobility  Muscle weakness (generalized)  ONSET DATE: 09/16/2022  SUBJECTIVE:  SUBJECTIVE STATEMENT: Pt reports 6/10 pain in her back today. Pt was able to get a Swiss ball from her granddaughter to work on stretches. Pt reports she felt better after session earlier this week. Pt reports that her job went ok yesterday, did have to lift her client from w/c to couch and carry her up the stairs.   PERTINENT HISTORY:  PMH: Arthritis, Depression, HLD,HTN, Hypothyroidism, Radiculopathy, Type II Diabetes   PAIN:  Are you having pain? Yes: NPRS scale: 9/10 Pain location: Back of legs, lower part of back Pain description: Aching Aggravating factors: Sitting  for too long, mornings when she gets up   Relieving factors: Nerve cream   PRECAUTIONS: None  WEIGHT BEARING RESTRICTIONS: No  FALLS:  Has patient fallen in last 6 months? 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   NEXT MD VISIT: 11/17/22 with Dr. Wynn Banker   OBJECTIVE:   DIAGNOSTIC FINDINGS:  MRI lumbar spine 08/07/22: IMPRESSION: 1. Widespread lumbar spine degeneration. Mild levoconvex lumbar scoliosis and subtle anterolisthesis of L4 on L5. 2. Severe multifactorial spinal, lateral recess, and foraminal stenosis at L3-L4. Query L3 and/or L4 radiculitis. 3. Mild spinal stenosis at both L2-L3 and L4-L5. And moderate to severe neural foraminal stenosis also at the bilateral L2 and left L5 nerve levels. 4. Fibroid uterus. Diverticulosis of the distal large bowel.  X-ray of hip:  IMPRESSION: Mild-to-moderate degenerative joint changes of the lumbar spine and lower thoracic spine, advanced compared to prior exam.   Mild degenerative joint changes of bilateral hips, left greater than right.  PATIENT SURVEYS:  Modified Oswestry  : 18/50 = Moderate disability, (pt took incr time filling out during eval)   SCREENING FOR RED FLAGS: Bowel or bladder incontinence: No Spinal tumors: No Cauda equina syndrome: No Compression fracture: No Abdominal aneurysm: No  COGNITION: Overall cognitive status: Within functional limits for tasks assessed     SENSATION: Light touch: WFL   POSTURE: rounded shoulders, forward head, and posterior pelvic tilt  PALPATION: TTP lumbar paraspinals, bilateral PSIS, central L5 and hypomobility with pt reporting incr pain   LUMBAR ROM:   AROM eval  Flexion Can reach  down to mid shin, bends knees and has incr tightness in back  Extension 75% limited, incr pain   Right lateral flexion WFL, incr pain in back  Left lateral flexion WFL, incr pain in back  Right rotation WFL, back hurts  Left rotation WFL, back hurts   (Blank rows = not tested)  LOWER EXTREMITY ROM:     Active  Right eval Left eval  Hip flexion Approx. 110 deg, incr tightness  Approx. 110 deg, incr tightness   Hip extension    Hip abduction    Hip adduction    Hip internal rotation Significantly limited with incr discomfort Significantly limited with incr discomfort  Hip external rotation [facility]  Knee flexion    Knee extension    Ankle dorsiflexion    Ankle plantarflexion    Ankle inversion    Ankle eversion     (Blank rows = not tested)  LOWER EXTREMITY MMT:    MMT Right eval Left eval  Hip flexion 3- (incr pain)  3- (incr pain)  Hip extension  Hip abduction 4 4  Hip adduction 4+ 4+  Hip internal rotation    Hip external rotation    Knee flexion 4+ 4+  Knee extension 4+ 4+  Ankle dorsiflexion 5 5  Ankle plantarflexion    Ankle inversion    Ankle eversion     (Blank rows = not tested)  All tested in sitting   LUMBAR SPECIAL TESTS:  Straight leg raise test: Negative, pt just feeling tightness in hamstrings  and Slump test: Negative, pt just feeling tightness in quad and hamstrings  Prone knee bend: approx. 90 degrees bilat, incr tightness   FUNCTIONAL TESTS:  5 times sit to stand: 19.22 seconds with no UE support, pt reporting 7/10 low back pain   GAIT: Distance walked: Clinic distances Assistive device utilized: None Level of assistance: Modified independence Comments: Decr trunk rotation   TODAY'S TREATMENT:                                                                                                                              TherEx SciFit level 3 for 8 minutes using BUE/BLEs for neural priming for reciprocal movement, dynamic cardiovascular  warmup and increased amplitude of stepping. RPE of 10/10 following activity.  Sidelying open books x 10 reps B with 15 sec hold  Supine alt L/R marches with TA contract x 10 reps B  Supine piriformis stretch 3 x 30 sec B  Weighted sit to stands with 2.5 kg weighted ball x 8 reps, with 2 kg weighted ball x 10 reps   PATIENT EDUCATION:  Education details: continue HEP Person educated: Patient Education method: Explanation, Demonstration, Tactile cues, and Verbal cues Education comprehension: verbalized understanding  HOME EXERCISE PROGRAM: Access Code: [email or web address] URL: [email or web address] Date: 10/07/2022 Prepared by: Peter Congo  Exercises - Supine Posterior Pelvic Tilt  - 1 x daily - 7 x weekly - 3 sets - 10 reps - 5 sec hold - Supine Lower Trunk Rotation  - 1 x daily - 7 x weekly - 3 sets - 10 reps - Seated Flexion Stretch with Swiss Ball  - 1 x daily - 7 x weekly - 1 sets - 5 reps - 30 sec hold - Seated Thoracic Flexion and Rotation with Swiss Ball  - 1 x daily - 7 x weekly - 1 sets - 15 reps - 30 sec hold  ASSESSMENT:  CLINICAL IMPRESSION: Emphasis of skilled PT session on continuing to work on stretching of thoracic and lumbar spine as well as hips and LE and to work on core strengthening and stability. Pt with most difficulty performing weighted sit to stands this date due to muscle activation needed to complete exercise. Pt continues to benefit from skilled therapy services to work towards improving her core stability and increasing functional ROM for improved symptom management. Continue POC.   OBJECTIVE IMPAIRMENTS: decreased activity tolerance, decreased endurance, decreased mobility, difficulty walking, decreased ROM, decreased strength, hypomobility, increased muscle spasms, impaired flexibility, postural  dysfunction, and pain.   ACTIVITY LIMITATIONS: lifting, bending, squatting, stairs, and locomotion level  PARTICIPATION LIMITATIONS: community  activity  PERSONAL FACTORS: Age, Behavior pattern, Past/current experiences, Time since onset of injury/illness/exacerbation, and 3+ comorbidities: Arthritis, Depression, HLD,HTN, Hypothyroidism, Radiculopathy, Type II Diabetes   are also affecting patient's functional outcome.   REHAB POTENTIAL: Good  CLINICAL DECISION MAKING: Stable/uncomplicated  EVALUATION COMPLEXITY: Low   GOALS: Goals reviewed with patient? Yes  SHORT TERM GOALS: ALL STGS = LTGS  LONG TERM GOALS: Target date: 10/29/2022  Pt will be independent with final HEP for low back pain, flexibility, and stretches in order to build upon functional gains made in therapy. Baseline:  Goal status: INITIAL  2.  Pt will initiate aquatic therapy. Baseline:  Goal status: INITIAL  3.  Pt will perform 5x sit <> stand to 16 seconds or less with 4/10 or less back pain in order to demo improved functional mobility.  Baseline: 19.22 seconds with no UE support, pt reporting 7/10 low back pain Goal status: INITIAL  4.  Pt will improve ODI to 12/50 or less in order to demo improved functional outcomes with back pain.  Baseline: 18/50 = Moderate disability, (pt took incr time filling out during eval) Goal status: INITIAL   PLAN:  PT FREQUENCY: 2x/week  PT DURATION: 8 weeks - anticipate only ~4 weeks   PLANNED INTERVENTIONS: Therapeutic exercises, Therapeutic activity, Neuromuscular re-education, Balance training, Gait training, Patient/Family education, Self Care, Joint mobilization, Aquatic Therapy, Dry Needling, Spinal mobilization, Manual therapy, and Re-evaluation.  PLAN FOR NEXT SESSION: add to HEP for hip strength, ROM AND STRETCHING!!! SciFit, no longer interested in pool therapy, dead lifts   Peter Congo, PT, DPT, CSRS  10/09/2022, 11:44 AM

## 2022-10-13 ENCOUNTER — Ambulatory Visit: Payer: Medicare HMO

## 2022-10-13 DIAGNOSIS — M48061 Spinal stenosis, lumbar region without neurogenic claudication: Secondary | ICD-10-CM | POA: Diagnosis not present

## 2022-10-13 DIAGNOSIS — M79605 Pain in left leg: Secondary | ICD-10-CM

## 2022-10-13 DIAGNOSIS — M1990 Unspecified osteoarthritis, unspecified site: Secondary | ICD-10-CM | POA: Diagnosis not present

## 2022-10-13 DIAGNOSIS — M5459 Other low back pain: Secondary | ICD-10-CM

## 2022-10-13 DIAGNOSIS — M6281 Muscle weakness (generalized): Secondary | ICD-10-CM | POA: Diagnosis not present

## 2022-10-13 DIAGNOSIS — R2689 Other abnormalities of gait and mobility: Secondary | ICD-10-CM | POA: Diagnosis not present

## 2022-10-13 DIAGNOSIS — Z789 Other specified health status: Secondary | ICD-10-CM | POA: Diagnosis not present

## 2022-10-13 DIAGNOSIS — Z7409 Other reduced mobility: Secondary | ICD-10-CM | POA: Diagnosis not present

## 2022-10-13 DIAGNOSIS — M79604 Pain in right leg: Secondary | ICD-10-CM

## 2022-10-13 NOTE — Therapy (Signed)
OUTPATIENT PHYSICAL THERAPY THORACOLUMBAR TREATMENT   Patient Name: Candice Warner MRN: [id number] DOB:[birth year], 72 y.o., female Today's Date: 10/13/2022  END OF SESSION:  PT End of Session - 10/13/22 1117     Visit Number 4    Number of Visits 9    Date for PT Re-Evaluation 11/30/22    Authorization Type Aetna Medicare    PT Start Time 1115    PT Stop Time 1200    PT Time Calculation (min) 45 min    Activity Tolerance Patient tolerated treatment well    Behavior During Therapy [facility] for tasks assessed/performed               Past Medical History:  Diagnosis Date   Arthritis    "right hip" (06/27/2018)   Depression    Hyperlipidemia    Hypertension    Hypokalemia    Hypothyroidism    Lumbar back pain    Obesity    Radiculopathy    Thyroid disease    Type II diabetes mellitus (HCC)    Past Surgical History:  Procedure Laterality Date   CYST REMOVAL HAND Right 11/07/2015   Procedure: EXCISION OF VOLAR RETANICULUM RIGHT HAND;  Surgeon: Loreta Ave, MD;  Location: [facility];  Service: Orthopedics;  Laterality: Right;   SHOULDER ARTHROSCOPY Left 2002   SHOULDER ARTHROSCOPY WITH ROTATOR CUFF REPAIR AND SUBACROMIAL DECOMPRESSION  07/14/2012   Procedure: SHOULDER ARTHROSCOPY WITH ROTATOR CUFF REPAIR AND SUBACROMIAL DECOMPRESSION;  Surgeon: Loreta Ave, MD;  Location: [facility];  Service: Orthopedics;  Laterality: Right;  Right Shoulder Arthroscopy, Distal Claviculectomy, Subacromial Decompression, Partial Acromioplasty with Coracoacromial Release with Arthroscopic Rotator Cuff Repair    THYROID LOBECTOMY Right    Goiter s/p right thyroidectomy   TRIGGER FINGER RELEASE Right 11/07/2015   Procedure: RELEASE TRIGGER FINGER/A-1 PULLEY RIGHT RING FINGER ;  Surgeon: Loreta Ave, MD;  Location: [facility];  Service: Orthopedics;  Laterality: Right;   TUBAL LIGATION     Patient Active Problem List   Diagnosis Date Noted    Myofascial low back pain 07/27/2022   Impaired mobility and ADLs 07/27/2022   Thigh pain 02/25/2022   Calcification of left breast 06/26/2019   Osteoarthritis 03/15/2019   Nodule of chest wall 06/21/2015   Left trigger finger 10/23/2014   Right hip pain 06/01/2011   HLD (hyperlipidemia) 04/16/2009   Type 2 diabetes mellitus without complication, without long-term current use of insulin (HCC) 10/29/2008   Facet arthropathy, multilevel 10/29/2008   Hypothyroidism 08/12/2006   OBESITY, NOS 08/12/2006   Major depressive disorder, recurrent episode (HCC) 08/12/2006   HYPERTENSION, BENIGN SYSTEMIC 08/12/2006   DIVERTICULOSIS OF COLON 08/12/2006    PCP: Elberta Fortis, MD  REFERRING PROVIDER: Angelina Sheriff, DO   REFERRING DIAG: [phone] (ICD-10-CM) - Neuroforaminal stenosis of lumbar spine Z74.09,Z78.9 (ICD-10-CM) - Impaired mobility and ADLs M19.90 (ICD-10-CM) - Arthritis    Rationale for Evaluation and Treatment: Rehabilitation  THERAPY DIAG:  Other low back pain  Pain in right leg  Other abnormalities of gait and mobility  Muscle weakness (generalized)  Pain in left leg  ONSET DATE: 09/16/2022  SUBJECTIVE:  SUBJECTIVE STATEMENT: Pt reports prolonged sitting and standing/walking both cause pain. Pt reports she starts to feel pain radiating from back to back of both legs to 8/10 in <10 min of standing/walking. Lying flat improves her pain.  PERTINENT HISTORY:  PMH: Arthritis, Depression, HLD,HTN, Hypothyroidism, Radiculopathy, Type II Diabetes   PAIN:  Are you having pain? Yes: NPRS scale: 8/10 Pain location: Back of legs, lower part of back Pain description: Aching Aggravating factors: Sitting for too long, mornings when she gets up   Relieving factors: Nerve cream    PRECAUTIONS: None  WEIGHT BEARING RESTRICTIONS: No  FALLS:  Has patient fallen in last 6 months? 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   NEXT MD VISIT: 11/17/22 with Dr. Wynn Banker   OBJECTIVE:   DIAGNOSTIC FINDINGS:  MRI lumbar spine 08/07/22: IMPRESSION: 1. Widespread lumbar spine degeneration. Mild levoconvex lumbar scoliosis and subtle anterolisthesis of L4 on L5. 2. Severe multifactorial spinal, lateral recess, and foraminal stenosis at L3-L4. Query L3 and/or L4 radiculitis. 3. Mild spinal stenosis at both L2-L3 and L4-L5. And moderate to severe neural foraminal stenosis also at the bilateral L2 and left L5 nerve levels. 4. Fibroid uterus. Diverticulosis of the distal large bowel.  X-ray of hip:  IMPRESSION: Mild-to-moderate degenerative joint changes of the lumbar spine and lower thoracic spine, advanced compared to prior exam.   Mild degenerative joint changes of bilateral hips, left greater than right.  PATIENT SURVEYS:  Modified Oswestry  : 18/50 = Moderate disability, (pt took incr time filling out during eval)   SCREENING FOR RED FLAGS: Bowel or bladder incontinence: No Spinal tumors: No Cauda equina syndrome: No Compression fracture: No Abdominal aneurysm: No  COGNITION: Overall cognitive status: Within functional limits for tasks assessed     SENSATION: Light touch: WFL   POSTURE: rounded shoulders, forward head, and posterior pelvic tilt  PALPATION: TTP lumbar paraspinals, bilateral PSIS, central L5 and hypomobility with pt reporting incr pain   LUMBAR ROM:   AROM eval  Flexion Can reach down to mid shin, bends knees and has incr tightness in back  Extension  75% limited, incr pain   Right lateral flexion WFL, incr pain in back  Left lateral flexion WFL, incr pain in back  Right rotation WFL, back hurts  Left rotation WFL, back hurts   (Blank rows = not tested)  LOWER EXTREMITY ROM:     Active  Right eval Left eval  Hip flexion Approx. 110 deg, incr tightness  Approx. 110 deg, incr tightness   Hip extension    Hip abduction    Hip adduction    Hip internal rotation Significantly limited with incr discomfort Significantly limited with incr discomfort  Hip external rotation [facility]  Knee flexion    Knee extension    Ankle dorsiflexion    Ankle plantarflexion    Ankle inversion    Ankle eversion     (Blank rows = not tested)  LOWER EXTREMITY MMT:    MMT Right eval Left eval  Hip flexion 3- (incr pain)  3- (incr pain)  Hip extension    Hip abduction 4 4  Hip adduction 4+ 4+  Hip internal rotation    Hip external rotation  Knee flexion 4+ 4+  Knee extension 4+ 4+  Ankle dorsiflexion 5 5  Ankle plantarflexion    Ankle inversion    Ankle eversion     (Blank rows = not tested)  All tested in sitting   LUMBAR SPECIAL TESTS:  Straight leg raise test: Negative, pt just feeling tightness in hamstrings  and Slump test: Negative, pt just feeling tightness in quad and hamstrings  Prone knee bend: approx. 90 degrees bilat, incr tightness   FUNCTIONAL TESTS:  5 times sit to stand: 19.22 seconds with no UE support, pt reporting 7/10 low back pain   GAIT: Distance walked: Clinic distances Assistive device utilized: None Level of assistance: Modified independence Comments: Decr trunk rotation   TODAY'S TREATMENT:                                                                                                                              TherEx Assessed lumbar ROM:  Flexion: no pain, pain relief Exension: 0 deg, with pain in back and bil LE Sidebending: 30% only, worst pain with L sidebending  PA mobs at L4-5, initially  causing radiating pain to bil LE, very tender over L1-3 but no radiating pain in LE Grade I-II PA mobs throughout lumbar spine performed Pt reported no radicular pain in LE after mobs at L4-5 Pt asked to be prone on elbows, reported pain in bil LE within 15 sec Continued with Grade I-II mobs, reassesed with prone on elbows, pain in bil LE within 15 sec Continued with one more round of mobs in lumbar spine, no improvement in bil LE pain with prone onelbow Assessed thoracic spine with PA mobs Pt reported radicular symptoms with PA mobs at T8, Performed Grade I-II mobs at T7-8, incorporated muscle enegery technique of inhalation/exhalation with PA mobs with exhalation Prone on elbows: pt reported "stretch" in bil LE after 1 min and it didn't get into worsening pain after 1 min 30 sec Performed cat and camel stretch in q-ped: 2 x 10 Prone on elbows: Pt reported lessening feeling of the "stretch" in her LE after 1 min and it didn't get worst after 1 min 30 sec Seated upper back extensions over mid back chair: 10x  Also demo thoracic extension with elbows on table in front, pt slides elbows forward and performs mini cat and camel for mid thraocic neural flossing/mobilization Pt educated to make sure she is working stretches in no to mild pain only for HEP   PATIENT EDUCATION:  Education details: continue HEP Person educated: Patient Education method: Programmer, multimedia, Demonstration, Actor cues, and Verbal cues Education comprehension: verbalized understanding  HOME EXERCISE PROGRAM: Access Code: [email or web address] URL: [email or web address] Date: 10/13/2022 Prepared by: Lavone Nian  Exercises - Supine Posterior Pelvic Tilt  - 1 x daily - 7 x weekly - 3 sets - 10 reps - 5 sec hold - Supine Lower Trunk Rotation  - 1 x daily - 7 x weekly - 3 sets -  10 reps - Seated Flexion Stretch with Swiss Ball  - 1 x daily - 7 x weekly - 1 sets - 5 reps - 30 sec hold - Seated Thoracic Flexion and  Rotation with Swiss Ball  - 1 x daily - 7 x weekly - 1 sets - 15 reps - 30 sec hold - Cat Cow  - 2-3 x daily - 7 x weekly - 1 sets - 10 reps - Seated Thoracic Lumbar Extension  - 2-3 x daily - 7 x weekly - 1 sets - 10 reps  ASSESSMENT:  CLINICAL IMPRESSION: Today's skilled session focused on finding out provacative signs for her LBP and LE radiculopathy. Pt had radicular symptoms with PA mobs at L4-5 and T8 in thoracic spine. Pt demonstrated most significant improvement with T8 mobilization. Neural flossing exercises, as well as thoracic extension mobilization exercises demonstrated improvement in her in session radicular session. Pt was provided HEP to complement today's session objective findings.   OBJECTIVE IMPAIRMENTS: decreased activity tolerance, decreased endurance, decreased mobility, difficulty walking, decreased ROM, decreased strength, hypomobility, increased muscle spasms, impaired flexibility, postural dysfunction, and pain.   ACTIVITY LIMITATIONS: lifting, bending, squatting, stairs, and locomotion level  PARTICIPATION LIMITATIONS: community activity  PERSONAL FACTORS: Age, Behavior pattern, Past/current experiences, Time since onset of injury/illness/exacerbation, and 3+ comorbidities: Arthritis, Depression, HLD,HTN, Hypothyroidism, Radiculopathy, Type II Diabetes   are also affecting patient's functional outcome.   REHAB POTENTIAL: Good  CLINICAL DECISION MAKING: Stable/uncomplicated  EVALUATION COMPLEXITY: Low   GOALS: Goals reviewed with patient? Yes  SHORT TERM GOALS: ALL STGS = LTGS  LONG TERM GOALS: Target date: 10/29/2022  Pt will be independent with final HEP for low back pain, flexibility, and stretches in order to build upon functional gains made in therapy. Baseline:  Goal status: INITIAL  2.  Pt will initiate aquatic therapy. Baseline:  Goal status: INITIAL  3.  Pt will perform 5x sit <> stand to 16 seconds or less with 4/10 or less back pain in  order to demo improved functional mobility.  Baseline: 19.22 seconds with no UE support, pt reporting 7/10 low back pain Goal status: INITIAL  4.  Pt will improve ODI to 12/50 or less in order to demo improved functional outcomes with back pain.  Baseline: 18/50 = Moderate disability, (pt took incr time filling out during eval) Goal status: INITIAL   PLAN:  PT FREQUENCY: 2x/week  PT DURATION: 8 weeks - anticipate only ~4 weeks   PLANNED INTERVENTIONS: Therapeutic exercises, Therapeutic activity, Neuromuscular re-education, Balance training, Gait training, Patient/Family education, Self Care, Joint mobilization, Aquatic Therapy, Dry Needling, Spinal mobilization, Manual therapy, and Re-evaluation.  PLAN FOR NEXT SESSION: How are new stretches working out, add to HEP for hip strength, ROM AND STRETCHING!!! SciFit, no longer interested in pool therapy, dead lifts   Ileana Ladd, PT 10/13/2022, 12:04 PM

## 2022-10-16 ENCOUNTER — Ambulatory Visit: Payer: Medicare HMO | Attending: Physical Medicine and Rehabilitation | Admitting: Physical Therapy

## 2022-10-16 DIAGNOSIS — M79605 Pain in left leg: Secondary | ICD-10-CM | POA: Diagnosis not present

## 2022-10-16 DIAGNOSIS — M6281 Muscle weakness (generalized): Secondary | ICD-10-CM | POA: Diagnosis not present

## 2022-10-16 DIAGNOSIS — M79604 Pain in right leg: Secondary | ICD-10-CM | POA: Diagnosis not present

## 2022-10-16 DIAGNOSIS — R2689 Other abnormalities of gait and mobility: Secondary | ICD-10-CM | POA: Diagnosis not present

## 2022-10-16 DIAGNOSIS — M5459 Other low back pain: Secondary | ICD-10-CM | POA: Insufficient documentation

## 2022-10-16 NOTE — Therapy (Signed)
OUTPATIENT PHYSICAL THERAPY THORACOLUMBAR TREATMENT   Patient Name: Candice Warner MRN: [id number] DOB:1951/02/26, 72 y.o., female Today's Date: 10/16/2022  END OF SESSION:  PT End of Session - 10/16/22 1104     Visit Number 5    Number of Visits 9    Date for PT Re-Evaluation 11/30/22    Authorization Type [employer]    PT Start Time 1102    PT Stop Time 1143    PT Time Calculation (min) 41 min    Activity Tolerance Patient tolerated treatment well    Behavior During Therapy [facility] for tasks assessed/performed                Past Medical History:  Diagnosis Date   Arthritis    "right hip" (06/27/2018)   Depression    Hyperlipidemia    Hypertension    Hypokalemia    Hypothyroidism    Lumbar back pain    Obesity    Radiculopathy    Thyroid disease    Type II diabetes mellitus (HCC)    Past Surgical History:  Procedure Laterality Date   CYST REMOVAL HAND Right 11/07/2015   Procedure: EXCISION OF VOLAR RETANICULUM RIGHT HAND;  Surgeon: Loreta Ave, MD;  Location: [facility];  Service: Orthopedics;  Laterality: Right;   SHOULDER ARTHROSCOPY Left 2002   SHOULDER ARTHROSCOPY WITH ROTATOR CUFF REPAIR AND SUBACROMIAL DECOMPRESSION  07/14/2012   Procedure: SHOULDER ARTHROSCOPY WITH ROTATOR CUFF REPAIR AND SUBACROMIAL DECOMPRESSION;  Surgeon: Loreta Ave, MD;  Location: [facility];  Service: Orthopedics;  Laterality: Right;  Right Shoulder Arthroscopy, Distal Claviculectomy, Subacromial Decompression, Partial Acromioplasty with Coracoacromial Release with Arthroscopic Rotator Cuff Repair    THYROID LOBECTOMY Right    Goiter s/p right thyroidectomy   TRIGGER FINGER RELEASE Right 11/07/2015   Procedure: RELEASE TRIGGER FINGER/A-1 PULLEY RIGHT RING FINGER ;  Surgeon: Loreta Ave, MD;  Location: [facility];  Service: Orthopedics;  Laterality: Right;   TUBAL LIGATION     Patient Active Problem List   Diagnosis Date Noted    Myofascial low back pain 07/27/2022   Impaired mobility and ADLs 07/27/2022   Thigh pain 02/25/2022   Calcification of left breast 06/26/2019   Osteoarthritis 03/15/2019   Nodule of chest wall 06/21/2015   Left trigger finger 10/23/2014   Right hip pain 06/01/2011   HLD (hyperlipidemia) 04/16/2009   Type 2 diabetes mellitus without complication, without long-term current use of insulin (HCC) 10/29/2008   Facet arthropathy, multilevel 10/29/2008   Hypothyroidism 08/12/2006   OBESITY, NOS 08/12/2006   Major depressive disorder, recurrent episode (HCC) 08/12/2006   HYPERTENSION, BENIGN SYSTEMIC 08/12/2006   DIVERTICULOSIS OF COLON 08/12/2006    PCP: Elberta Fortis, MD  REFERRING PROVIDER: Angelina Sheriff, DO   REFERRING DIAG: [phone] (ICD-10-CM) - Neuroforaminal stenosis of lumbar spine Z74.09,Z78.9 (ICD-10-CM) - Impaired mobility and ADLs M19.90 (ICD-10-CM) - Arthritis    Rationale for Evaluation and Treatment: Rehabilitation  THERAPY DIAG:  Other low back pain  Pain in right leg  Other abnormalities of gait and mobility  Muscle weakness (generalized)  Pain in left leg  ONSET DATE: 09/16/2022  SUBJECTIVE:  SUBJECTIVE STATEMENT: Pt reports she is doing better, pain is 7/10 today. Pt likes the stretches from last visit, feels like they are helpful.  PERTINENT HISTORY:  PMH: Arthritis, Depression, HLD,HTN, Hypothyroidism, Radiculopathy, Type II Diabetes   PAIN:  Are you having pain? Yes: NPRS scale: 7/10 Pain location: Back of legs, lower part of back Pain description: Aching Aggravating factors: Sitting for too long, mornings when she gets up   Relieving factors: Nerve cream   PRECAUTIONS: None  WEIGHT BEARING RESTRICTIONS: No  FALLS:  Has patient fallen in last 6  months? 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   NEXT MD VISIT: 11/17/22 with Dr. Wynn Banker   OBJECTIVE:   DIAGNOSTIC FINDINGS:  MRI lumbar spine 08/07/22: IMPRESSION: 1. Widespread lumbar spine degeneration. Mild levoconvex lumbar scoliosis and subtle anterolisthesis of L4 on L5. 2. Severe multifactorial spinal, lateral recess, and foraminal stenosis at L3-L4. Query L3 and/or L4 radiculitis. 3. Mild spinal stenosis at both L2-L3 and L4-L5. And moderate to severe neural foraminal stenosis also at the bilateral L2 and left L5 nerve levels. 4. Fibroid uterus. Diverticulosis of the distal large bowel.  X-ray of hip:  IMPRESSION: Mild-to-moderate degenerative joint changes of the lumbar spine and lower thoracic spine, advanced compared to prior exam.   Mild degenerative joint changes of bilateral hips, left greater than right.  PATIENT SURVEYS:  Modified Oswestry  : 18/50 = Moderate disability, (pt took incr time filling out during eval)   SCREENING FOR RED FLAGS: Bowel or bladder incontinence: No Spinal tumors: No Cauda equina syndrome: No Compression fracture: No Abdominal aneurysm: No  COGNITION: Overall cognitive status: Within functional limits for tasks assessed     SENSATION: Light touch: WFL   POSTURE: rounded shoulders, forward head, and posterior pelvic tilt  PALPATION: TTP lumbar paraspinals, bilateral PSIS, central L5 and hypomobility with pt reporting incr pain   LUMBAR ROM:   AROM eval  Flexion Can reach down to mid shin, bends knees and has incr tightness in back  Extension 75% limited, incr pain   Right lateral flexion WFL, incr pain in back  Left lateral flexion WFL,  incr pain in back  Right rotation WFL, back hurts  Left rotation WFL, back hurts   (Blank rows = not tested)  LOWER EXTREMITY ROM:     Active  Right eval Left eval  Hip flexion Approx. 110 deg, incr tightness  Approx. 110 deg, incr tightness   Hip extension    Hip abduction    Hip adduction    Hip internal rotation Significantly limited with incr discomfort Significantly limited with incr discomfort  Hip external rotation [facility]  Knee flexion    Knee extension    Ankle dorsiflexion    Ankle plantarflexion    Ankle inversion    Ankle eversion     (Blank rows = not tested)  LOWER EXTREMITY MMT:    MMT Right eval Left eval  Hip flexion 3- (incr pain)  3- (incr pain)  Hip extension    Hip abduction 4 4  Hip adduction 4+ 4+  Hip internal rotation    Hip external rotation    Knee flexion 4+ 4+  Knee extension 4+ 4+  Ankle dorsiflexion  5 5  Ankle plantarflexion    Ankle inversion    Ankle eversion     (Blank rows = not tested)  All tested in sitting   LUMBAR SPECIAL TESTS:  Straight leg raise test: Negative, pt just feeling tightness in hamstrings  and Slump test: Negative, pt just feeling tightness in quad and hamstrings  Prone knee bend: approx. 90 degrees bilat, incr tightness   FUNCTIONAL TESTS:  5 times sit to stand: 19.22 seconds with no UE support, pt reporting 7/10 low back pain   GAIT: Distance walked: Clinic distances Assistive device utilized: None Level of assistance: Modified independence Comments: Decr trunk rotation   TODAY'S TREATMENT:                                                                                                                              TherEx SciFit multi-peaks level 3 for 8 minutes using BUE/BLEs for neural priming for reciprocal movement, dynamic cardiovascular warmup and increased amplitude of stepping.  On mat table for thoracic stretching and strengthening of core: Child's pose 3 x 30 sec + thread the needle 4 x 30  sec each B Seated windmills x 10 reps B Supine thoracic mobilization with towel roll x 10 reps Supine marches with TA contraction x 10 reps B Seated resisted marches x 10 reps with RTB   PATIENT EDUCATION:  Education details: continue HEP, added to [employer] Person educated: Patient Education method: Programmer, multimedia, Demonstration, Actor cues, and Verbal cues Education comprehension: verbalized understanding  HOME EXERCISE PROGRAM: Access Code: [email or web address] URL: [email or web address] Date: 10/13/2022 Prepared by: Lavone Nian  Exercises - Supine Posterior Pelvic Tilt  - 1 x daily - 7 x weekly - 3 sets - 10 reps - 5 sec hold - Supine Lower Trunk Rotation  - 1 x daily - 7 x weekly - 3 sets - 10 reps - Seated Flexion Stretch with Swiss Ball  - 1 x daily - 7 x weekly - 1 sets - 5 reps - 30 sec hold - Seated Thoracic Flexion and Rotation with Swiss Ball  - 1 x daily - 7 x weekly - 1 sets - 15 reps - 30 sec hold - Cat Cow  - 2-3 x daily - 7 x weekly - 1 sets - 10 reps - Seated Thoracic Lumbar Extension  - 2-3 x daily - 7 x weekly - 1 sets - 10 reps - Supine Thoracic Mobilization Towel Roll Vertical with Arm Stretch  - 1 x daily - 7 x weekly - 3 sets - 10 reps - Seated March with Resistance  - 1 x daily - 7 x weekly - 3 sets - 10 reps  ASSESSMENT:  CLINICAL IMPRESSION: Emphasis of skilled PT session on working on thoracic mobility, core strengthening, and hip strengthening. Pt continues to exhibit limited thoracic mobility and decreased hip strength leading to ongoing pain and impaired function. Pt continues to benefit from skilled therapy services to work towards improving  her core stability, thoracic mobility, and improve her symptom management. Continue POC.   OBJECTIVE IMPAIRMENTS: decreased activity tolerance, decreased endurance, decreased mobility, difficulty walking, decreased ROM, decreased strength, hypomobility, increased muscle spasms, impaired flexibility, postural  dysfunction, and pain.   ACTIVITY LIMITATIONS: lifting, bending, squatting, stairs, and locomotion level  PARTICIPATION LIMITATIONS: community activity  PERSONAL FACTORS: Age, Behavior pattern, Past/current experiences, Time since onset of injury/illness/exacerbation, and 3+ comorbidities: Arthritis, Depression, HLD,HTN, Hypothyroidism, Radiculopathy, Type II Diabetes   are also affecting patient's functional outcome.   REHAB POTENTIAL: Good  CLINICAL DECISION MAKING: Stable/uncomplicated  EVALUATION COMPLEXITY: Low   GOALS: Goals reviewed with patient? Yes  SHORT TERM GOALS: ALL STGS = LTGS  LONG TERM GOALS: Target date: 10/29/2022  Pt will be independent with final HEP for low back pain, flexibility, and stretches in order to build upon functional gains made in therapy. Baseline:  Goal status: INITIAL  2.  Pt will initiate aquatic therapy. Baseline:  Goal status: INITIAL  3.  Pt will perform 5x sit <> stand to 16 seconds or less with 4/10 or less back pain in order to demo improved functional mobility.  Baseline: 19.22 seconds with no UE support, pt reporting 7/10 low back pain Goal status: INITIAL  4.  Pt will improve ODI to 12/50 or less in order to demo improved functional outcomes with back pain.  Baseline: 18/50 = Moderate disability, (pt took incr time filling out during eval) Goal status: INITIAL   PLAN:  PT FREQUENCY: 2x/week  PT DURATION: 8 weeks - anticipate only ~4 weeks   PLANNED INTERVENTIONS: Therapeutic exercises, Therapeutic activity, Neuromuscular re-education, Balance training, Gait training, Patient/Family education, Self Care, Joint mobilization, Aquatic Therapy, Dry Needling, Spinal mobilization, Manual therapy, and Re-evaluation.  PLAN FOR NEXT SESSION: how is HEP?, add to HEP for hip strength, ROM AND STRETCHING!!! SciFit, no longer interested in pool therapy, dead lifts, seated therex on swiss ball   Peter Congo, PT, DPT, CSRS 10/16/2022,  11:44 AM

## 2022-10-21 ENCOUNTER — Ambulatory Visit: Payer: Medicare HMO | Admitting: Physical Therapy

## 2022-10-21 DIAGNOSIS — M5459 Other low back pain: Secondary | ICD-10-CM

## 2022-10-21 DIAGNOSIS — M79604 Pain in right leg: Secondary | ICD-10-CM

## 2022-10-21 DIAGNOSIS — H25813 Combined forms of age-related cataract, bilateral: Secondary | ICD-10-CM | POA: Diagnosis not present

## 2022-10-21 DIAGNOSIS — M6281 Muscle weakness (generalized): Secondary | ICD-10-CM

## 2022-10-21 DIAGNOSIS — M79605 Pain in left leg: Secondary | ICD-10-CM

## 2022-10-21 DIAGNOSIS — E119 Type 2 diabetes mellitus without complications: Secondary | ICD-10-CM | POA: Diagnosis not present

## 2022-10-21 DIAGNOSIS — R2689 Other abnormalities of gait and mobility: Secondary | ICD-10-CM

## 2022-10-21 DIAGNOSIS — Z0100 Encounter for examination of eyes and vision without abnormal findings: Secondary | ICD-10-CM | POA: Diagnosis not present

## 2022-10-21 DIAGNOSIS — H16223 Keratoconjunctivitis sicca, not specified as Sjogren's, bilateral: Secondary | ICD-10-CM | POA: Diagnosis not present

## 2022-10-21 NOTE — Therapy (Signed)
OUTPATIENT PHYSICAL THERAPY THORACOLUMBAR TREATMENT-ARRIVED NO CHARGE   Patient Name: Candice Warner MRN: [id number] DOB:1950-12-03, 72 y.o., female Today's Date: 10/21/2022  END OF SESSION:  PT End of Session - 10/21/22 1106     Visit Number 5   arrived no charge   Number of Visits 9    Date for PT Re-Evaluation 11/30/22    Authorization Type A[employer]na Medicare    PT Start Time 1105    PT Stop Time 1109   arrived no charge   PT Time Calculation (min) 4 min    Activity Tolerance Patient limited by pain    Behavior During Therapy [facility] for tasks assessed/performed                 Past Medical History:  Diagnosis Date   Arthritis    "right hip" (06/27/2018)   Depression    Hyperlipidemia    Hypertension    Hypokalemia    Hypothyroidism    Lumbar back pain    Obesity    Radiculopathy    Thyroid disease    Type II diabetes mellitus (HCC)    Past Surgical History:  Procedure Laterality Date   CYST REMOVAL HAND Right 11/07/2015   Procedure: EXCISION OF VOLAR RETANICULUM RIGHT HAND;  Surgeon: Loreta Ave, MD;  Location: [facility];  Service: Orthopedics;  Laterality: Right;   SHOULDER ARTHROSCOPY Left 2002   SHOULDER ARTHROSCOPY WITH ROTATOR CUFF REPAIR AND SUBACROMIAL DECOMPRESSION  07/14/2012   Procedure: SHOULDER ARTHROSCOPY WITH ROTATOR CUFF REPAIR AND SUBACROMIAL DECOMPRESSION;  Surgeon: Loreta Ave, MD;  Location: [facility];  Service: Orthopedics;  Laterality: Right;  Right Shoulder Arthroscopy, Distal Claviculectomy, Subacromial Decompression, Partial Acromioplasty with Coracoacromial Release with Arthroscopic Rotator Cuff Repair    THYROID LOBECTOMY Right    Goiter s/p right thyroidectomy   TRIGGER FINGER RELEASE Right 11/07/2015   Procedure: RELEASE TRIGGER FINGER/A-1 PULLEY RIGHT RING FINGER ;  Surgeon: Loreta Ave, MD;  Location: [facility];  Service: Orthopedics;  Laterality: Right;   TUBAL LIGATION      Patient Active Problem List   Diagnosis Date Noted   Myofascial low back pain 07/27/2022   Impaired mobility and ADLs 07/27/2022   Thigh pain 02/25/2022   Calcification of left breast 06/26/2019   Osteoarthritis 03/15/2019   Nodule of chest wall 06/21/2015   Left trigger finger 10/23/2014   Right hip pain 06/01/2011   HLD (hyperlipidemia) 04/16/2009   Type 2 diabetes mellitus without complication, without long-term current use of insulin (HCC) 10/29/2008   Facet arthropathy, multilevel 10/29/2008   Hypothyroidism 08/12/2006   OBESITY, NOS 08/12/2006   Major depressive disorder, recurrent episode (HCC) 08/12/2006   HYPERTENSION, BENIGN SYSTEMIC 08/12/2006   DIVERTICULOSIS OF COLON 08/12/2006    PCP: Elberta Fortis, MD  REFERRING PROVIDER: Angelina Sheriff, DO   REFERRING DIAG: [phone] (ICD-10-CM) - Neuroforaminal stenosis of lumbar spine Z74.09,Z78.9 (ICD-10-CM) - Impaired mobility and ADLs M19.90 (ICD-10-CM) - Arthritis    Rationale for Evaluation and Treatment: Rehabilitation  THERAPY DIAG:  Other low back pain  Pain in right leg  Other abnormalities of gait and mobility  Muscle weakness (generalized)  Pain in left leg  ONSET DATE: 09/16/2022  SUBJECTIVE:  SUBJECTIVE STATEMENT: Pt reports she just went to the eye doctor before this appointment, having a headache now. Pt reports she is having some low back soreness with her exercises, feeling it more in her RLE today.  PERTINENT HISTORY:  PMH: Arthritis, Depression, HLD,HTN, Hypothyroidism, Radiculopathy, Type II Diabetes   PAIN:  Are you having pain? Yes: NPRS scale: 7/10 Pain location: Back of legs, lower part of back Pain description: Aching Aggravating factors: Sitting for too long, mornings when she gets up    Relieving factors: Nerve cream   PRECAUTIONS: None  WEIGHT BEARING RESTRICTIONS: No  FALLS:  Has patient fallen in last 6 months? 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   NEXT MD VISIT: 11/17/22 with Dr. Wynn Banker   OBJECTIVE:   DIAGNOSTIC FINDINGS:  MRI lumbar spine 08/07/22: IMPRESSION: 1. Widespread lumbar spine degeneration. Mild levoconvex lumbar scoliosis and subtle anterolisthesis of L4 on L5. 2. Severe multifactorial spinal, lateral recess, and foraminal stenosis at L3-L4. Query L3 and/or L4 radiculitis. 3. Mild spinal stenosis at both L2-L3 and L4-L5. And moderate to severe neural foraminal stenosis also at the bilateral L2 and left L5 nerve levels. 4. Fibroid uterus. Diverticulosis of the distal large bowel.  X-ray of hip:  IMPRESSION: Mild-to-moderate degenerative joint changes of the lumbar spine and lower thoracic spine, advanced compared to prior exam.   Mild degenerative joint changes of bilateral hips, left greater than right.  PATIENT SURVEYS:  Modified Oswestry  : 18/50 = Moderate disability, (pt took incr time filling out during eval)   SCREENING FOR RED FLAGS: Bowel or bladder incontinence: No Spinal tumors: No Cauda equina syndrome: No Compression fracture: No Abdominal aneurysm: No  COGNITION: Overall cognitive status: Within functional limits for tasks assessed     SENSATION: Light touch: WFL   POSTURE: rounded shoulders, forward head, and posterior pelvic tilt  PALPATION: TTP lumbar paraspinals, bilateral PSIS, central L5 and hypomobility with pt reporting incr pain   LUMBAR ROM:   AROM eval  Flexion Can reach down to mid shin, bends knees and has  incr tightness in back  Extension 75% limited, incr pain   Right lateral flexion WFL, incr pain in back  Left lateral flexion WFL, incr pain in back  Right rotation WFL, back hurts  Left rotation WFL, back hurts   (Blank rows = not tested)  LOWER EXTREMITY ROM:     Active  Right eval Left eval  Hip flexion Approx. 110 deg, incr tightness  Approx. 110 deg, incr tightness   Hip extension    Hip abduction    Hip adduction    Hip internal rotation Significantly limited with incr discomfort Significantly limited with incr discomfort  Hip external rotation [facility]  Knee flexion    Knee extension    Ankle dorsiflexion    Ankle plantarflexion    Ankle inversion    Ankle eversion     (Blank rows = not tested)  LOWER EXTREMITY MMT:    MMT Right eval Left eval  Hip flexion 3- (incr pain)  3- (incr pain)  Hip extension    Hip abduction 4 4  Hip adduction 4+ 4+  Hip internal rotation    Hip external rotation  Knee flexion 4+ 4+  Knee extension 4+ 4+  Ankle dorsiflexion 5 5  Ankle plantarflexion    Ankle inversion    Ankle eversion     (Blank rows = not tested)  All tested in sitting   LUMBAR SPECIAL TESTS:  Straight leg raise test: Negative, pt just feeling tightness in hamstrings  and Slump test: Negative, pt just feeling tightness in quad and hamstrings  Prone knee bend: approx. 90 degrees bilat, incr tightness   FUNCTIONAL TESTS:  5 times sit to stand: 19.22 seconds with no UE support, pt reporting 7/10 low back pain   GAIT: Distance walked: Clinic distances Assistive device utilized: None Level of assistance: Modified independence Comments: Decr trunk rotation   TODAY'S TREATMENT:                                                                                                                              Arrived No Charge Patient with a headache from her eye doctor's appointment this date and not up for participation in therapy session. Deferred treatment this  date and pt send home to rest before her next scheduled appointment later this week.   PATIENT EDUCATION:  Education details: continue HEP as tolerated Person educated: Patient Education method: Explanation Education comprehension: verbalized understanding  HOME EXERCISE PROGRAM: Access Code: [email or web address] URL: [email or web address] Date: 10/13/2022 Prepared by: Lavone Nian  Exercises - Supine Posterior Pelvic Tilt  - 1 x daily - 7 x weekly - 3 sets - 10 reps - 5 sec hold - Supine Lower Trunk Rotation  - 1 x daily - 7 x weekly - 3 sets - 10 reps - Seated Flexion Stretch with Swiss Ball  - 1 x daily - 7 x weekly - 1 sets - 5 reps - 30 sec hold - Seated Thoracic Flexion and Rotation with Swiss Ball  - 1 x daily - 7 x weekly - 1 sets - 15 reps - 30 sec hold - Cat Cow  - 2-3 x daily - 7 x weekly - 1 sets - 10 reps - Seated Thoracic Lumbar Extension  - 2-3 x daily - 7 x weekly - 1 sets - 10 reps - Supine Thoracic Mobilization Towel Roll Vertical with Arm Stretch  - 1 x daily - 7 x weekly - 3 sets - 10 reps - Seated March with Resistance  - 1 x daily - 7 x weekly - 3 sets - 10 reps  ASSESSMENT:  CLINICAL IMPRESSION: Arrived no charge due to patient not feeling well today with a headache following her eye doctor appointment this AM.   OBJECTIVE IMPAIRMENTS: decreased activity tolerance, decreased endurance, decreased mobility, difficulty walking, decreased ROM, decreased strength, hypomobility, increased muscle spasms, impaired flexibility, postural dysfunction, and pain.   ACTIVITY LIMITATIONS: lifting, bending, squatting, stairs, and locomotion level  PARTICIPATION LIMITATIONS: community activity  PERSONAL FACTORS: Age, Behavior pattern, Past/current experiences, Time since onset of injury/illness/exacerbation, and 3+ comorbidities: Arthritis, Depression, HLD,HTN, Hypothyroidism,  Radiculopathy, Type II Diabetes   are also affecting patient's functional outcome.   REHAB  POTENTIAL: Good  CLINICAL DECISION MAKING: Stable/uncomplicated  EVALUATION COMPLEXITY: Low   GOALS: Goals reviewed with patient? Yes  SHORT TERM GOALS: ALL STGS = LTGS  LONG TERM GOALS: Target date: 10/29/2022  Pt will be independent with final HEP for low back pain, flexibility, and stretches in order to build upon functional gains made in therapy. Baseline:  Goal status: INITIAL  2.  Pt will initiate aquatic therapy. Baseline:  Goal status: INITIAL  3.  Pt will perform 5x sit <> stand to 16 seconds or less with 4/10 or less back pain in order to demo improved functional mobility.  Baseline: 19.22 seconds with no UE support, pt reporting 7/10 low back pain Goal status: INITIAL  4.  Pt will improve ODI to 12/50 or less in order to demo improved functional outcomes with back pain.  Baseline: 18/50 = Moderate disability, (pt took incr time filling out during eval) Goal status: INITIAL   PLAN:  PT FREQUENCY: 2x/week  PT DURATION: 8 weeks - anticipate only ~4 weeks   PLANNED INTERVENTIONS: Therapeutic exercises, Therapeutic activity, Neuromuscular re-education, Balance training, Gait training, Patient/Family education, Self Care, Joint mobilization, Aquatic Therapy, Dry Needling, Spinal mobilization, Manual therapy, and Re-evaluation.  PLAN FOR NEXT SESSION: how is HEP?, add to HEP for hip strength, ROM AND STRETCHING!!! SciFit, no longer interested in pool therapy, dead lifts, seated therex on swiss ball   Peter Congo, PT, DPT, CSRS 10/21/2022, 11:10 AM

## 2022-10-23 ENCOUNTER — Ambulatory Visit: Payer: Medicare HMO | Admitting: Physical Therapy

## 2022-10-23 DIAGNOSIS — M6281 Muscle weakness (generalized): Secondary | ICD-10-CM | POA: Diagnosis not present

## 2022-10-23 DIAGNOSIS — M5459 Other low back pain: Secondary | ICD-10-CM | POA: Diagnosis not present

## 2022-10-23 DIAGNOSIS — M79604 Pain in right leg: Secondary | ICD-10-CM | POA: Diagnosis not present

## 2022-10-23 DIAGNOSIS — R2689 Other abnormalities of gait and mobility: Secondary | ICD-10-CM | POA: Diagnosis not present

## 2022-10-23 DIAGNOSIS — M79605 Pain in left leg: Secondary | ICD-10-CM | POA: Diagnosis not present

## 2022-10-23 NOTE — Therapy (Signed)
OUTPATIENT PHYSICAL THERAPY THORACOLUMBAR TREATMENT   Patient Name: Candice Warner MRN: [id number] DOB:[birth year], 72 y.o., female Today's Date: 10/23/2022  END OF SESSION:  PT End of Session - 10/23/22 1101     Visit Number 6    Number of Visits 9    Date for PT Re-Evaluation 11/30/22    Authorization Type [employer]    PT Start Time 1100    PT Stop Time 1140    PT Time Calculation (min) 40 min    Activity Tolerance Patient tolerated treatment well    Behavior During Therapy WFL for tasks assessed/performed                  Past Medical History:  Diagnosis Date   Arthritis    "right hip" (06/27/2018)   Depression    Hyperlipidemia    Hypertension    Hypokalemia    Hypothyroidism    Lumbar back pain    Obesity    Radiculopathy    Thyroid disease    Type II diabetes mellitus (HCC)    Past Surgical History:  Procedure Laterality Date   CYST REMOVAL HAND Right 11/07/2015   Procedure: EXCISION OF VOLAR RETANICULUM RIGHT HAND;  Surgeon: Loreta Ave, MD;  Location: [facility];  Service: Orthopedics;  Laterality: Right;   SHOULDER ARTHROSCOPY Left 2002   SHOULDER ARTHROSCOPY WITH ROTATOR CUFF REPAIR AND SUBACROMIAL DECOMPRESSION  07/14/2012   Procedure: SHOULDER ARTHROSCOPY WITH ROTATOR CUFF REPAIR AND SUBACROMIAL DECOMPRESSION;  Surgeon: Loreta Ave, MD;  Location: [facility];  Service: Orthopedics;  Laterality: Right;  Right Shoulder Arthroscopy, Distal Claviculectomy, Subacromial Decompression, Partial Acromioplasty with Coracoacromial Release with Arthroscopic Rotator Cuff Repair    THYROID LOBECTOMY Right    Goiter s/p right thyroidectomy   TRIGGER FINGER RELEASE Right 11/07/2015   Procedure: RELEASE TRIGGER FINGER/A-1 PULLEY RIGHT RING FINGER ;  Surgeon: Loreta Ave, MD;  Location:  SUR[facility]ERY [facility];  Service: Orthopedics;  Laterality: Right;   TUBAL LIGATION     Patient Active Problem List   Diagnosis Date  Noted   Myofascial low back pain 07/27/2022   Impaired mobility and ADLs 07/27/2022   Thigh pain 02/25/2022   Calcification of left breast 06/26/2019   Osteoarthritis 03/15/2019   Nodule of chest wall 06/21/2015   Left trigger finger 10/23/2014   Right hip pain 06/01/2011   HLD (hyperlipidemia) 04/16/2009   Type 2 diabetes mellitus without complication, without long-term current use of insulin (HCC) 10/29/2008   Facet arthropathy, multilevel 10/29/2008   Hypothyroidism 08/12/2006   OBESITY, NOS 08/12/2006   Major depressive disorder, recurrent episode (HCC) 08/12/2006   HYPERTENSION, BENIGN SYSTEMIC 08/12/2006   DIVERTICULOSIS OF COLON 08/12/2006    PCP: Elberta Fortis, MD  REFERRING PROVIDER: Angelina Sheriff, DO   REFERRING DIAG: [phone] (ICD-10-CM) - Neuroforaminal stenosis of lumbar spine Z74.09,Z78.9 (ICD-10-CM) - Impaired mobility and ADLs M19.90 (ICD-10-CM) - Arthritis    Rationale for Evaluation and Treatment: Rehabilitation  THERAPY DIAG:  Other low back pain  Pain in right leg  Other abnormalities of gait and mobility  Muscle weakness (generalized)  Pain in left leg  ONSET DATE: 09/16/2022  SUBJECTIVE:  SUBJECTIVE STATEMENT: Pt reports pain in her lower back down into her BLE today, 5/10. Pt has seen an improvement in her symptoms from initial evaluation. Pt reports she was having some pain/soreness after working on her HEP.  PERTINENT HISTORY:  PMH: Arthritis, Depression, HLD,HTN, Hypothyroidism, Radiculopathy, Type II Diabetes   PAIN:  Are you having pain? Yes: NPRS scale: 5/10 Pain location: Back of legs, lower part of back Pain description: Aching Aggravating factors: Sitting for too long, mornings when she gets up   Relieving factors: Nerve cream    PRECAUTIONS: None  WEIGHT BEARING RESTRICTIONS: No  FALLS:  Has patient fallen in last 6 months? 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   NEXT MD VISIT: 11/17/22 with Dr. Wynn Banker   OBJECTIVE:   DIAGNOSTIC FINDINGS:  MRI lumbar spine 08/07/22: IMPRESSION: 1. Widespread lumbar spine degeneration. Mild levoconvex lumbar scoliosis and subtle anterolisthesis of L4 on L5. 2. Severe multifactorial spinal, lateral recess, and foraminal stenosis at L3-L4. Query L3 and/or L4 radiculitis. 3. Mild spinal stenosis at both L2-L3 and L4-L5. And moderate to severe neural foraminal stenosis also at the bilateral L2 and left L5 nerve levels. 4. Fibroid uterus. Diverticulosis of the distal large bowel.  X-ray of hip:  IMPRESSION: Mild-to-moderate degenerative joint changes of the lumbar spine and lower thoracic spine, advanced compared to prior exam.   Mild degenerative joint changes of bilateral hips, left greater than right.  PATIENT SURVEYS:  Modified Oswestry  : 18/50 = Moderate disability, (pt took incr time filling out during eval)   SCREENING FOR RED FLAGS: Bowel or bladder incontinence: No Spinal tumors: No Cauda equina syndrome: No Compression fracture: No Abdominal aneurysm: No  COGNITION: Overall cognitive status: Within functional limits for tasks assessed     SENSATION: Light touch: WFL   POSTURE: rounded shoulders, forward head, and posterior pelvic tilt  PALPATION: TTP lumbar paraspinals, bilateral PSIS, central L5 and hypomobility with pt reporting incr pain   LUMBAR ROM:   AROM eval  Flexion Can reach down to mid shin, bends knees and has incr tightness in back  Extension  75% limited, incr pain   Right lateral flexion WFL, incr pain in back  Left lateral flexion WFL, incr pain in back  Right rotation WFL, back hurts  Left rotation WFL, back hurts   (Blank rows = not tested)  LOWER EXTREMITY ROM:     Active  Right eval Left eval  Hip flexion Approx. 110 deg, incr tightness  Approx. 110 deg, incr tightness   Hip extension    Hip abduction    Hip adduction    Hip internal rotation Significantly limited with incr discomfort Significantly limited with incr discomfort  Hip external rotation [facility]  Knee flexion    Knee extension    Ankle dorsiflexion    Ankle plantarflexion    Ankle inversion    Ankle eversion     (Blank rows = not tested)  LOWER EXTREMITY MMT:    MMT Right eval Left eval  Hip flexion 3- (incr pain)  3- (incr pain)  Hip extension    Hip abduction 4 4  Hip adduction 4+ 4+  Hip internal rotation    Hip external rotation  Knee flexion 4+ 4+  Knee extension 4+ 4+  Ankle dorsiflexion 5 5  Ankle plantarflexion    Ankle inversion    Ankle eversion     (Blank rows = not tested)  All tested in sitting   LUMBAR SPECIAL TESTS:  Straight leg raise test: Negative, pt just feeling tightness in hamstrings  and Slump test: Negative, pt just feeling tightness in quad and hamstrings  Prone knee bend: approx. 90 degrees bilat, incr tightness   FUNCTIONAL TESTS:  5 times sit to stand: 19.22 seconds with no UE support, pt reporting 7/10 low back pain   GAIT: Distance walked: Clinic distances Assistive device utilized: None Level of assistance: Modified independence Comments: Decr trunk rotation   TODAY'S TREATMENT:                                                                                                                              TherEx Supine LTR x 10 reps each direction Supine SKTC 3 x 30 sec each  Seated therex on green Swiss ball for core strengthening with TA contraction: Seated A/P pelvic tilts x 10  reps Seated L/R lateral pelvic tilts x 10 reps Seated CW/CCW pelvic circles x 10 reps each direction Seated marches x 10 reps Seated marches with alt UE lifts x 10 reps  Standing alt UE/LE lifts with gait, 2 x 25 ft with CGA for balance; focus on core activation and dynamic standing balance.  SciFit multi-peaks level 4 for 8 minutes using BUE/BLEs for neural priming for reciprocal movement, dynamic cardiovascular warmup and increased amplitude of stepping.   No increase in pain following treatment session this date.    PATIENT EDUCATION:  Education details: continue HEP as tolerated Person educated: Patient Education method: Explanation Education comprehension: verbalized understanding  HOME EXERCISE PROGRAM: Access Code: [email or web address] URL: [email or web address] Date: 10/13/2022 Prepared by: Lavone Nian  Exercises - Supine Posterior Pelvic Tilt  - 1 x daily - 7 x weekly - 3 sets - 10 reps - 5 sec hold - Supine Lower Trunk Rotation  - 1 x daily - 7 x weekly - 3 sets - 10 reps - Seated Flexion Stretch with Swiss Ball  - 1 x daily - 7 x weekly - 1 sets - 5 reps - 30 sec hold - Seated Thoracic Flexion and Rotation with Swiss Ball  - 1 x daily - 7 x weekly - 1 sets - 15 reps - 30 sec hold - Cat Cow  - 2-3 x daily - 7 x weekly - 1 sets - 10 reps - Seated Thoracic Lumbar Extension  - 2-3 x daily - 7 x weekly - 1 sets - 10 reps - Supine Thoracic Mobilization Towel Roll Vertical with Arm Stretch  - 1 x daily - 7 x weekly - 3 sets - 10 reps - Seated March with Resistance  - 1 x daily - 7 x weekly - 3 sets - 10 reps - Pelvic Tilt  on [employer]  - 1 x daily - 7 x weekly - 3 sets - 10 reps - Seated Lateral Pelvic Tilt on Swiss Ball  - 1 x daily - 7 x weekly - 3 sets - 10 reps   ASSESSMENT:  CLINICAL IMPRESSION: Emphasis of skilled PT session on continuing to work on stretching of lumbar and thoracic region as well as strengthening and stability of core region for improved  management of pain symptoms. Pt has no increased in pain with exercises this session. Pt continues to benefit from skilled therapy services to address her pain and work towards LTGs. Continue POC.    OBJECTIVE IMPAIRMENTS: decreased activity tolerance, decreased endurance, decreased mobility, difficulty walking, decreased ROM, decreased strength, hypomobility, increased muscle spasms, impaired flexibility, postural dysfunction, and pain.   ACTIVITY LIMITATIONS: lifting, bending, squatting, stairs, and locomotion level  PARTICIPATION LIMITATIONS: community activity  PERSONAL FACTORS: Age, Behavior pattern, Past/current experiences, Time since onset of injury/illness/exacerbation, and 3+ comorbidities: Arthritis, Depression, HLD,HTN, Hypothyroidism, Radiculopathy, Type II Diabetes   are also affecting patient's functional outcome.   REHAB POTENTIAL: Good  CLINICAL DECISION MAKING: Stable/uncomplicated  EVALUATION COMPLEXITY: Low   GOALS: Goals reviewed with patient? Yes  SHORT TERM GOALS: ALL STGS = LTGS  LONG TERM GOALS: Target date: 10/29/2022  Pt will be independent with final HEP for low back pain, flexibility, and stretches in order to build upon functional gains made in therapy. Baseline:  Goal status: INITIAL  2.  Pt will initiate aquatic therapy. Baseline:  Goal status: INITIAL  3.  Pt will perform 5x sit <> stand to 16 seconds or less with 4/10 or less back pain in order to demo improved functional mobility.  Baseline: 19.22 seconds with no UE support, pt reporting 7/10 low back pain Goal status: INITIAL  4.  Pt will improve ODI to 12/50 or less in order to demo improved functional outcomes with back pain.  Baseline: 18/50 = Moderate disability, (pt took incr time filling out during eval) Goal status: INITIAL   PLAN:  PT FREQUENCY: 2x/week  PT DURATION: 8 weeks - anticipate only ~4 weeks   PLANNED INTERVENTIONS: Therapeutic exercises, Therapeutic activity,  Neuromuscular re-education, Balance training, Gait training, Patient/Family education, Self Care, Joint mobilization, Aquatic Therapy, Dry Needling, Spinal mobilization, Manual therapy, and Re-evaluation.  PLAN FOR NEXT SESSION: how is HEP? Check LTG and d/c?   Peter Congo, PT, DPT, CSRS 10/23/2022, 11:40 AM

## 2022-10-27 ENCOUNTER — Ambulatory Visit: Payer: Medicare HMO | Admitting: Physical Therapy

## 2022-10-27 DIAGNOSIS — M6281 Muscle weakness (generalized): Secondary | ICD-10-CM | POA: Diagnosis not present

## 2022-10-27 DIAGNOSIS — M79604 Pain in right leg: Secondary | ICD-10-CM | POA: Diagnosis not present

## 2022-10-27 DIAGNOSIS — M5459 Other low back pain: Secondary | ICD-10-CM

## 2022-10-27 DIAGNOSIS — R2689 Other abnormalities of gait and mobility: Secondary | ICD-10-CM

## 2022-10-27 DIAGNOSIS — M79605 Pain in left leg: Secondary | ICD-10-CM | POA: Diagnosis not present

## 2022-10-27 NOTE — Therapy (Signed)
OUTPATIENT PHYSICAL THERAPY THORACOLUMBAR TREATMENT-DISCHARGE NOTE   Patient Name: Candice Warner MRN: [id number] DOB:1950/10/05, 72 y.o., female Today's Date: 10/27/2022  PHYSICAL THERAPY DISCHARGE SUMMARY  Visits from Start of Care: 7  Current functional level related to goals / functional outcomes: Mod I to independent    Remaining deficits: Ongoing chronic low back pain into BLE; improved with exercises   Education / Equipment: Handout for HEP   Patient agrees to discharge. Patient goals were partially met. Patient is being discharged due to being pleased with the current functional level.   END OF SESSION:  PT End of Session - 10/27/22 1103     Visit Number 7    Number of Visits 9    Date for PT Re-Evaluation 11/30/22    Authorization Type Aetna Medicare    PT Start Time 1102    Activity Tolerance Patient tolerated treatment well    Behavior During Therapy [facility] for tasks assessed/performed                   Past Medical History:  Diagnosis Date   Arthritis    "right hip" (06/27/2018)   Depression    Hyperlipidemia    Hypertension    Hypokalemia    Hypothyroidism    Lumbar back pain    Obesity    Radiculopathy    Thyroid disease    Type II diabetes mellitus (HCC)    Past Surgical History:  Procedure Laterality Date   CYST REMOVAL HAND Right 11/07/2015   Procedure: EXCISION OF VOLAR RETANICULUM RIGHT HAND;  Surgeon: Loreta Ave, MD;  Location: [facility];  Service: Orthopedics;  Laterality: Right;   SHOULDER ARTHROSCOPY Left 2002   SHOULDER ARTHROSCOPY WITH ROTATOR CUFF REPAIR AND SUBACROMIAL DECOMPRESSION  07/14/2012   Procedure: SHOULDER ARTHROSCOPY WITH ROTATOR CUFF REPAIR AND SUBACROMIAL DECOMPRESSION;  Surgeon: Loreta Ave, MD;  Location: [facility];  Service: Orthopedics;  Laterality: Right;  Right Shoulder Arthroscopy, Distal Claviculectomy, Subacromial Decompression, Partial Acromioplasty with  Coracoacromial Release with Arthroscopic Rotator Cuff Repair    THYROID LOBECTOMY Right    Goiter s/p right thyroidectomy   TRIGGER FINGER RELEASE Right 11/07/2015   Procedure: RELEASE TRIGGER FINGER/A-1 PULLEY RIGHT RING FINGER ;  Surgeon: Loreta Ave, MD;  Location: [facility];  Service: Orthopedics;  Laterality: Right;   TUBAL LIGATION     Patient Active Problem List   Diagnosis Date Noted   Myofascial low back pain 07/27/2022   Impaired mobility and ADLs 07/27/2022   Thigh pain 02/25/2022   Calcification of left breast 06/26/2019   Osteoarthritis 03/15/2019   Nodule of chest wall 06/21/2015   Left trigger finger 10/23/2014   Right hip pain 06/01/2011   HLD (hyperlipidemia) 04/16/2009   Type 2 diabetes mellitus without complication, without long-term current use of insulin (HCC) 10/29/2008   Facet arthropathy, multilevel 10/29/2008   Hypothyroidism 08/12/2006   OBESITY, NOS 08/12/2006   Major depressive disorder, recurrent episode (HCC) 08/12/2006   HYPERTENSION, BENIGN SYSTEMIC 08/12/2006   DIVERTICULOSIS OF COLON 08/12/2006    PCP: Elberta Fortis, MD  REFERRING PROVIDER: Angelina Sheriff, DO   REFERRING DIAG: [phone] (ICD-10-CM) - Neuroforaminal stenosis of lumbar spine Z74.09,Z78.9 (ICD-10-CM) - Impaired mobility and ADLs M19.90 (ICD-10-CM) - Arthritis    Rationale for Evaluation and Treatment: Rehabilitation  THERAPY DIAG:  Other low back pain  Pain in right leg  Other abnormalities of gait and mobility  Muscle weakness (generalized)  Pain in left  leg  ONSET DATE: 09/16/2022  SUBJECTIVE:                                                                                                                                                                                           SUBJECTIVE STATEMENT: Pt reports 8/10 pain in her lower back today down into her legs, increased by the rainy weather today. Pt reports her HEP is going well, no  questions over it.  PERTINENT HISTORY:  PMH: Arthritis, Depression, HLD,HTN, Hypothyroidism, Radiculopathy, Type II Diabetes   PAIN:  Are you having pain? Yes: NPRS scale: 5/10 Pain location: Back of legs, lower part of back Pain description: Aching Aggravating factors: Sitting for too long, mornings when she gets up   Relieving factors: Nerve cream   PRECAUTIONS: None  WEIGHT BEARING RESTRICTIONS: No  FALLS:  Has patient fallen in last 6 months? 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   NEXT MD VISIT: 11/17/22 with Dr. Wynn Banker   OBJECTIVE:   DIAGNOSTIC FINDINGS:  MRI lumbar spine 08/07/22: IMPRESSION: 1. Widespread lumbar spine degeneration. Mild levoconvex lumbar scoliosis and subtle anterolisthesis of L4 on L5. 2. Severe multifactorial spinal, lateral recess, and foraminal stenosis at L3-L4. Query L3 and/or L4 radiculitis. 3. Mild spinal stenosis at both L2-L3 and L4-L5. And moderate to severe neural foraminal stenosis also at the bilateral L2 and left L5 nerve levels. 4. Fibroid uterus. Diverticulosis of the distal large bowel.  X-ray of hip:  IMPRESSION: Mild-to-moderate degenerative joint changes of the lumbar spine and lower thoracic spine, advanced compared to prior exam.   Mild degenerative joint changes of bilateral hips, left greater than right.  PATIENT SURVEYS:  Modified Oswestry  : 18/50 = Moderate disability, (pt took incr time filling out during eval)   SCREENING FOR RED FLAGS: Bowel or bladder incontinence: No Spinal tumors: No Cauda equina syndrome: No Compression fracture: No Abdominal aneurysm: No  COGNITION: Overall cognitive status: Within functional limits  for tasks assessed     SENSATION: Light touch: WFL   POSTURE: rounded shoulders, forward head, and posterior pelvic tilt  PALPATION: TTP lumbar paraspinals, bilateral PSIS, central L5 and hypomobility with pt reporting incr pain   LUMBAR ROM:   AROM eval  Flexion Can reach down to mid shin, bends knees and has incr tightness in back  Extension 75% limited, incr pain  Right lateral flexion WFL, incr pain in back  Left lateral flexion WFL, incr pain in back  Right rotation WFL, back hurts  Left rotation WFL, back hurts   (Blank rows = not tested)  LOWER EXTREMITY ROM:     Active  Right eval Left eval  Hip flexion Approx. 110 deg, incr tightness  Approx. 110 deg, incr tightness   Hip extension    Hip abduction    Hip adduction    Hip internal rotation Significantly limited with incr discomfort Significantly limited with incr discomfort  Hip external rotation [facility]  Knee flexion    Knee extension    Ankle dorsiflexion    Ankle plantarflexion    Ankle inversion    Ankle eversion     (Blank rows = not tested)  LOWER EXTREMITY MMT:    MMT Right eval Left eval  Hip flexion 3- (incr pain)  3- (incr pain)  Hip extension    Hip abduction 4 4  Hip adduction 4+ 4+  Hip internal rotation    Hip external rotation    Knee flexion 4+ 4+  Knee extension 4+ 4+  Ankle dorsiflexion 5 5  Ankle plantarflexion    Ankle inversion    Ankle eversion     (Blank rows = not tested)  All tested in sitting   LUMBAR SPECIAL TESTS:  Straight leg raise test: Negative, pt just feeling tightness in hamstrings  and Slump test: Negative, pt just feeling tightness in quad and hamstrings  Prone knee bend: approx. 90 degrees bilat, incr tightness   FUNCTIONAL TESTS:  5 times sit to stand: 19.22 seconds with no UE support, pt reporting 7/10 low back pain   GAIT: Distance walked: Clinic distances Assistive device utilized: None Level of assistance: Modified independence Comments:  Decr trunk rotation   TODAY'S TREATMENT:                                                                                                                              TherAct For STG assessment: Reassessed Oswestry: 16/50  [facility] PT Assessment - 10/27/22 1116       Standardized Balance Assessment   Standardized Balance Assessment Five Times Sit to Stand    Five times sit to stand comments  29.6 sec   BUE support on arms of chair             PATIENT EDUCATION:  Education details: continue HEP as tolerated, d/c from PT and how to obtain new referral if she needs to return in the future Person educated: Patient Education method: Explanation Education comprehension: verbalized understanding  HOME EXERCISE PROGRAM: Access Code: [email or web address] URL: [email or web address] Date: 10/13/2022 Prepared by: Lavone Nian  Exercises - Supine Posterior Pelvic Tilt  - 1 x daily - 7 x weekly - 3 sets - 10 reps - 5 sec hold - Supine Lower Trunk Rotation  - 1 x daily - 7 x weekly - 3 sets -  10 reps - Seated Flexion Stretch with Swiss Ball  - 1 x daily - 7 x weekly - 1 sets - 5 reps - 30 sec hold - Seated Thoracic Flexion and Rotation with Swiss Ball  - 1 x daily - 7 x weekly - 1 sets - 15 reps - 30 sec hold - Cat Cow  - 2-3 x daily - 7 x weekly - 1 sets - 10 reps - Seated Thoracic Lumbar Extension  - 2-3 x daily - 7 x weekly - 1 sets - 10 reps - Supine Thoracic Mobilization Towel Roll Vertical with Arm Stretch  - 1 x daily - 7 x weekly - 3 sets - 10 reps - Seated March with Resistance  - 1 x daily - 7 x weekly - 3 sets - 10 reps - Pelvic Tilt on Swiss Ball  - 1 x daily - 7 x weekly - 3 sets - 10 reps - Seated Lateral Pelvic Tilt on Swiss Ball  - 1 x daily - 7 x weekly - 3 sets - 10 reps   ASSESSMENT:  CLINICAL IMPRESSION: Emphasis of skilled PT session on reassessing LTG in preparation for d/c from OPPT services this date. Pt has met 1/4 LTG due to being independent with her final  HEP for management of her low back pain and symptoms. Pt did not meet goal of aquatic therapy as she declined these services. She did improve her score on the Oswestry from 18/50 to 16/50, demonstrating decreased disability level. However, she needed increased time to complete the 5xSTS this date and had increased pain at rest and with mobility due to the weather today. Overall, pt is pleased with her progress and is comfortable continuing with her HEP independently at home for management of her symptoms.   OBJECTIVE IMPAIRMENTS: decreased activity tolerance, decreased endurance, decreased mobility, difficulty walking, decreased ROM, decreased strength, hypomobility, increased muscle spasms, impaired flexibility, postural dysfunction, and pain.   ACTIVITY LIMITATIONS: lifting, bending, squatting, stairs, and locomotion level  PARTICIPATION LIMITATIONS: community activity  PERSONAL FACTORS: Age, Behavior pattern, Past/current experiences, Time since onset of injury/illness/exacerbation, and 3+ comorbidities: Arthritis, Depression, HLD,HTN, Hypothyroidism, Radiculopathy, Type II Diabetes   are also affecting patient's functional outcome.   REHAB POTENTIAL: Good  CLINICAL DECISION MAKING: Stable/uncomplicated  EVALUATION COMPLEXITY: Low   GOALS: Goals reviewed with patient? Yes  SHORT TERM GOALS: ALL STGS = LTGS  LONG TERM GOALS: Target date: 10/29/2022  Pt will be independent with final HEP for low back pain, flexibility, and stretches in order to build upon functional gains made in therapy. Baseline:  Goal status: MET  2.  Pt will initiate aquatic therapy. Baseline: pt no longer interested in aquatic therapy, goal d/c Goal status: NOT MET  3.  Pt will perform 5x sit <> stand to 16 seconds or less with 4/10 or less back pain in order to demo improved functional mobility.  Baseline: 19.22 seconds with no UE support, pt reporting 7/10 low back pain, 29.6 sec with 8/10 pain (5/14) Goal  status: NOT MET  4.  Pt will improve ODI to 12/50 or less in order to demo improved functional outcomes with back pain.  Baseline: 18/50 = Moderate disability, (pt took incr time filling out during eval), 16/50 (5/14) Goal status: NOT MET      Peter Congo, PT, DPT, CSRS 10/27/2022, 11:04 AM

## 2022-10-29 ENCOUNTER — Ambulatory Visit: Payer: Medicare HMO | Admitting: Physical Therapy

## 2022-11-17 ENCOUNTER — Encounter: Payer: Medicare HMO | Admitting: Physical Medicine & Rehabilitation

## 2022-11-17 ENCOUNTER — Encounter: Payer: Self-pay | Admitting: Physical Medicine & Rehabilitation

## 2022-11-17 VITALS — BP 138/78 | HR 61 | Ht 66.0 in | Wt 183.0 lb

## 2022-11-17 DIAGNOSIS — M48061 Spinal stenosis, lumbar region without neurogenic claudication: Secondary | ICD-10-CM

## 2022-11-17 NOTE — Progress Notes (Unsigned)
  PROCEDURE RECORD [facility]   Name: SHANDREA AHLMAN DOB:04-Aug-1950 MRN: [id number]  Date:11/17/2022  Physician: Claudette Laws, MD    Nurse/CMA: Nedra Hai, CMA  Allergies: No Known Allergies  Consent Signed: Yes.    Is patient diabetic? Yes.    CBG today? 129  Pregnant: No. LMP: No LMP recorded. Patient is postmenopausal. (age [age])  Anticoagulants: no Anti-inflammatory: yes (Naproxen, taken last this AM) Antibiotics: no  Procedure: Epidural Steroid Injection  Position: Prone Start Time: ***  End Time: ***  Fluoro Time: ***  RN/CMA Nedra Hai, CMA Sederick Jacobsen, CMA    Time 10:14 am     BP 138/78     Pulse 61     Respirations 16 16    O2 Sat 97     S/S 6 6    Pain Level 5/10      D/C home with alone, patient A & O X 3, D/C instructions reviewed, and sits independently.

## 2022-11-17 NOTE — Progress Notes (Unsigned)
Pt scheduled for ESI but has no driver, will complete pre procedure form and reschedule

## 2022-12-04 ENCOUNTER — Other Ambulatory Visit: Payer: Self-pay

## 2022-12-04 ENCOUNTER — Other Ambulatory Visit: Payer: Self-pay | Admitting: Physical Medicine and Rehabilitation

## 2022-12-04 LAB — HM DIABETES EYE EXAM

## 2022-12-04 MED ORDER — ATORVASTATIN CALCIUM 40 MG PO TABS: 40.0000 mg | ORAL_TABLET | Freq: Every day | ORAL | 3 refills | Status: DC

## 2022-12-08 ENCOUNTER — Other Ambulatory Visit: Payer: Self-pay | Admitting: *Deleted

## 2022-12-08 ENCOUNTER — Other Ambulatory Visit: Payer: Self-pay

## 2022-12-08 DIAGNOSIS — F339 Major depressive disorder, recurrent, unspecified: Secondary | ICD-10-CM

## 2022-12-08 MED ORDER — SERTRALINE HCL 100 MG PO TABS: 50.0000 mg | ORAL_TABLET | Freq: Every day | ORAL | 1 refills | Status: DC

## 2022-12-08 MED ORDER — METFORMIN HCL ER 500 MG PO TB24: 1000.0000 mg | ORAL_TABLET | Freq: Two times a day (BID) | ORAL | 1 refills | Status: DC

## 2022-12-08 MED ORDER — LISINOPRIL 20 MG PO TABS: 20.0000 mg | ORAL_TABLET | Freq: Every day | ORAL | 1 refills | Status: DC

## 2022-12-11 ENCOUNTER — Telehealth: Payer: Self-pay | Admitting: Family Medicine

## 2022-12-11 NOTE — Telephone Encounter (Signed)
Placed in MDs box to be filled out. Ayumi Wangerin, CMA  

## 2022-12-11 NOTE — Telephone Encounter (Signed)
Patient dropped off form at front desk for [facility].  Verified that patient section of form has been completed.  Last DOS/WCC with PCP was 08/12/2022.  Placed form in Red team folder to be completed by clinical staff.  Patient would like forms faxed. ROI has been completed.  Candice Warner

## 2022-12-21 NOTE — Progress Notes (Signed)
  PROCEDURE RECORD [facility]   Name: Candice Warner DOB:08-Apr-1951 MRN: [id number]  Date:12/21/2022  Physician: Claudette Laws, MD    Nurse/CMA: Charise Carwin MA  Allergies: No Known Allergies  Consent Signed: Yes.    Is patient diabetic? Yes.    CBG today? 187  Pregnant: No. LMP: No LMP recorded. Patient is postmenopausal. (age [age])  Anticoagulants: no Anti-inflammatory: no Antibiotics: no  Procedure: Bilateral L3-4 transforaminal ESI  Position: Prone Start Time: 11:58 am  End Time: 12:09 pm  Fluoro Time: 1:00  RN/CMA Nakul Avino MA Pat Elicker MA Erynne Kealey MA   Time 11:51 am 12:15 pm 12:25 pm   BP 145/77 157/70 148/77   Pulse 60 57    Respirations 16 16    O2 Sat 97 98    S/S 6 6    Pain Level 10/10 0/10     D/C home with Barrett (Brother), patient A & O X 3, D/C instructions reviewed, and sits independently.

## 2022-12-25 ENCOUNTER — Encounter: Payer: Self-pay | Admitting: Physical Medicine & Rehabilitation

## 2022-12-25 ENCOUNTER — Encounter: Payer: Medicare HMO | Attending: Physical Medicine and Rehabilitation | Admitting: Physical Medicine & Rehabilitation

## 2022-12-25 VITALS — BP 145/77 | HR 60 | Ht 66.0 in | Wt 185.0 lb

## 2022-12-25 DIAGNOSIS — M48061 Spinal stenosis, lumbar region without neurogenic claudication: Secondary | ICD-10-CM

## 2022-12-25 MED ORDER — LIDOCAINE HCL 1 % IJ SOLN
5.0000 mL | Freq: Once | INTRAMUSCULAR | Status: AC
Start: 2022-12-25 — End: 2022-12-25
  Administered 2022-12-25: 5 mL

## 2022-12-25 MED ORDER — LIDOCAINE HCL (PF) 1 % IJ SOLN
5.0000 mL | Freq: Once | INTRAMUSCULAR | Status: AC
Start: 2022-12-25 — End: 2022-12-25
  Administered 2022-12-25: 5 mL

## 2022-12-25 MED ORDER — IOHEXOL 180 MG/ML  SOLN
6.0000 mL | Freq: Once | INTRAMUSCULAR | Status: AC
  Administered 2022-12-25: 6 mL

## 2022-12-25 MED ORDER — DEXAMETHASONE SODIUM PHOSPHATE 10 MG/ML IJ SOLN
20.0000 mg | Freq: Once | INTRAMUSCULAR | Status: AC
Start: 2022-12-25 — End: 2022-12-25
  Administered 2022-12-25: 20 mg

## 2022-12-25 NOTE — Progress Notes (Signed)
Bilateral L3-4 Lumbar transforaminal epidural steroid injection under fluoroscopic guidance with contrast enhancement  Indication: Lumbosacral radiculitis is not relieved by medication management or other conservative care and interfering with self-care and mobility.   Informed consent was obtained after describing risk and benefits of the procedure with the patient, this includes bleeding, bruising, infection, paralysis and medication side effects.  The patient wishes to proceed and has given written consent.  Patient was placed in prone position.  The lumbar area was marked and prepped with Betadine.  It was entered with a 25-gauge 1-1/2 inch needle and one mL of 1% lidocaine was injected into the skin and subcutaneous tissue.  Then a 22-gauge 5" spinal needle was inserted into the Left  L3-4  intervertebral foramen under AP, lateral, and oblique view. Retroneural approach utilized .  Once needle tip was within the foramen on lateral views an dnor exceeding 6 o clock position on th epedical on AP viewed Isovue 200 was inected x 2ml . Then a solution containing one mL of 10 mg per mL dexamethasone and 2 mL of 1% lidocaine was injected.  The patient tolerated procedure well. The same procedure was performed on the right side with the same needle injectate but using "safe triangle"  technique.   Post procedure instructions were given.  Please see post procedure form.

## 2022-12-25 NOTE — Patient Instructions (Signed)

## 2023-01-18 ENCOUNTER — Encounter: Payer: Medicare Other | Admitting: Physical Medicine and Rehabilitation

## 2023-01-19 ENCOUNTER — Encounter
Payer: Medicare Other | Attending: Physical Medicine and Rehabilitation | Admitting: Physical Medicine and Rehabilitation

## 2023-01-19 DIAGNOSIS — M48061 Spinal stenosis, lumbar region without neurogenic claudication: Secondary | ICD-10-CM | POA: Insufficient documentation

## 2023-02-11 NOTE — Progress Notes (Deleted)
    SUBJECTIVE:   CHIEF COMPLAINT / HPI:   Hypothyroidism: *Recheck TSH  T2DM *A1c *Foot exam  Low back pain Seeing PM&R. Received epidural injection on 12/2022  PERTINENT  PMH / PSH: ***  OBJECTIVE:   There were no vitals taken for this visit. ***  General: NAD, pleasant, able to participate in exam Cardiac: RRR, no murmurs. Respiratory: CTAB, normal effort, No wheezes, rales or rhonchi Abdomen: Bowel sounds present, nontender, nondistended Extremities: no edema or cyanosis. Skin: warm and dry, no rashes noted Neuro: alert, no obvious focal deficits Psych: Normal affect and mood  ASSESSMENT/PLAN:   No problem-specific Assessment & Plan notes found for this encounter.     Dr. Elberta Fortis, DO [facility]    {    This will disappear when note is signed, click to select method of visit    :1}

## 2023-02-12 ENCOUNTER — Ambulatory Visit: Payer: Medicare Other | Admitting: Family Medicine

## 2023-02-22 ENCOUNTER — Other Ambulatory Visit: Payer: Self-pay | Admitting: Family Medicine

## 2023-02-22 DIAGNOSIS — I10 Essential (primary) hypertension: Secondary | ICD-10-CM

## 2023-02-22 NOTE — Progress Notes (Unsigned)
    SUBJECTIVE:   CHIEF COMPLAINT / HPI:   Hypothyroidism: *Recheck TSH  T2DM *A1c *Foot exam  Low back pain Seeing PM&R. Received epidural injection on 12/2022  PERTINENT  PMH / PSH: ***  OBJECTIVE:   There were no vitals taken for this visit. ***  General: NAD, pleasant, able to participate in exam Cardiac: RRR, no murmurs. Respiratory: CTAB, normal effort, No wheezes, rales or rhonchi Abdomen: Bowel sounds present, nontender, nondistended Extremities: no edema or cyanosis. Skin: warm and dry, no rashes noted Neuro: alert, no obvious focal deficits Psych: Normal affect and mood  ASSESSMENT/PLAN:   No problem-specific Assessment & Plan notes found for this encounter.     Dr. Elberta Fortis, DO [facility]    {    This will disappear when note is signed, click to select method of visit    :1}

## 2023-02-23 ENCOUNTER — Ambulatory Visit (INDEPENDENT_AMBULATORY_CARE_PROVIDER_SITE_OTHER): Payer: Medicare HMO | Admitting: Family Medicine

## 2023-02-23 ENCOUNTER — Encounter: Payer: Self-pay | Admitting: Family Medicine

## 2023-02-23 VITALS — BP 130/64 | HR 79 | Ht 66.0 in | Wt 183.2 lb

## 2023-02-23 DIAGNOSIS — E039 Hypothyroidism, unspecified: Secondary | ICD-10-CM

## 2023-02-23 DIAGNOSIS — I10 Essential (primary) hypertension: Secondary | ICD-10-CM | POA: Diagnosis not present

## 2023-02-23 DIAGNOSIS — W19XXXA Unspecified fall, initial encounter: Secondary | ICD-10-CM | POA: Insufficient documentation

## 2023-02-23 DIAGNOSIS — D649 Anemia, unspecified: Secondary | ICD-10-CM | POA: Diagnosis not present

## 2023-02-23 DIAGNOSIS — E119 Type 2 diabetes mellitus without complications: Secondary | ICD-10-CM | POA: Diagnosis not present

## 2023-02-23 DIAGNOSIS — M545 Low back pain, unspecified: Secondary | ICD-10-CM | POA: Diagnosis not present

## 2023-02-23 DIAGNOSIS — Z23 Encounter for immunization: Secondary | ICD-10-CM

## 2023-02-23 LAB — POCT GLYCOSYLATED HEMOGLOBIN (HGB A1C): HbA1c, POC (controlled diabetic range): 6.8 % (ref 0.0–7.0)

## 2023-02-23 MED ORDER — TRAMADOL HCL 50 MG PO TABS
50.0000 mg | ORAL_TABLET | Freq: Two times a day (BID) | ORAL | 0 refills | Status: AC | PRN
Start: 2023-02-23 — End: 2023-03-09

## 2023-02-23 MED ORDER — NAPROXEN 500 MG PO TABS
500.0000 mg | ORAL_TABLET | Freq: Two times a day (BID) | ORAL | 0 refills | Status: DC
Start: 2023-02-23 — End: 2023-03-11

## 2023-02-23 NOTE — Patient Instructions (Signed)
It was wonderful to see you today! Thank you for choosing [facility].   Please bring ALL of your medications with you to every visit.   Today we talked about:  I am so sorry you had a fall!  I think we should get imaging of.  Low in mid back to make sure you do not have any compression fractures.  As far as her knee goes I believe it is just a bruise or a collection of blood under the surface of the skin.  It will likely take a few more weeks to improve.  For pain management I would like you to take the naproxen twice daily for the next 2 weeks.  You can use the tramadol as needed for breakthrough pain but please only use it sparingly.  As mentioned I do not recommend this medications long-term due to kidney concerns. We are getting blood work today to check your thyroid, kidneys and blood cell count.  I will follow-up with those results.  Please follow up in 1 month for symptom check  If you haven't already, sign up for My Chart to have easy access to your labs results, and communication with your primary care physician.   We are checking some labs today. If they are abnormal, I will call you. If they are normal, I will send you a MyChart message (if it is active) or a letter in the mail. If you do not hear about your labs in the next 2 weeks, please call the office.  Call the clinic at [phone] if your symptoms worsen or you have any concerns.  Please be sure to schedule follow up at the front desk before you leave today.   Elberta Fortis, DO Family Medicine

## 2023-02-23 NOTE — Assessment & Plan Note (Signed)
Stable on levothyroxine 50 mcg. -Yearly TSH

## 2023-02-23 NOTE — Assessment & Plan Note (Signed)
130/64, well-controlled on current therapy of amlodipine 10 mg daily and lisinopril 20 mg daily

## 2023-02-23 NOTE — Assessment & Plan Note (Addendum)
Persistent back and knee pain limiting ADLs and does see PM&R, does not appointment scheduled until 03/2023. Right knee pain most consistent with soft tissue hematoma, no joint line pain therefore low suspicion for ligamentous or bony injury. Midline back pain concerning for acute injury although difficult to determine due to chronic back pain. -Thoracic and lumbar x-rays to assess for compression fracture -Pain management with naproxen 500 mg twice daily and tramadol 50 mg every 12 hours as needed.  Discussed with patient she may have a pain contract with PM&R but I do think her acute injury does warrant stronger pain regimen. -Return to clinic in 1 month to reassess symptoms

## 2023-02-23 NOTE — Assessment & Plan Note (Signed)
A1c 6.8, increased from 6.4 in 2/24. Compliant on metformin 1000 mg twice daily. -Recommend continued lifestyle modification with diet and exercise -Repeat A1c in 6 months (08/2022)

## 2023-02-24 ENCOUNTER — Ambulatory Visit
Admission: RE | Admit: 2023-02-24 | Discharge: 2023-02-24 | Disposition: A | Discharge status: Home / Self Care | Payer: Medicare HMO | Source: Ambulatory Visit | Attending: Family Medicine

## 2023-02-24 ENCOUNTER — Other Ambulatory Visit: Payer: Self-pay | Admitting: Family Medicine

## 2023-02-24 DIAGNOSIS — M545 Low back pain, unspecified: Secondary | ICD-10-CM

## 2023-02-24 DIAGNOSIS — E039 Hypothyroidism, unspecified: Secondary | ICD-10-CM

## 2023-02-24 DIAGNOSIS — D649 Anemia, unspecified: Secondary | ICD-10-CM

## 2023-02-24 DIAGNOSIS — E119 Type 2 diabetes mellitus without complications: Secondary | ICD-10-CM

## 2023-02-24 DIAGNOSIS — I10 Essential (primary) hypertension: Secondary | ICD-10-CM

## 2023-02-24 DIAGNOSIS — M549 Dorsalgia, unspecified: Secondary | ICD-10-CM | POA: Diagnosis not present

## 2023-02-24 DIAGNOSIS — Z23 Encounter for immunization: Secondary | ICD-10-CM

## 2023-02-24 DIAGNOSIS — W19XXXA Unspecified fall, initial encounter: Secondary | ICD-10-CM

## 2023-02-24 LAB — BASIC METABOLIC PANEL
BUN/Creatinine Ratio: 12 (ref 12–28)
BUN: 14 mg/dL (ref 8–27)
CO2: 22 mmol/L (ref 20–29)
Calcium: 10 mg/dL (ref 8.7–10.3)
Chloride: 106 mmol/L (ref 96–106)
Creatinine, Ser: 1.16 mg/dL — ABNORMAL HIGH (ref 0.57–1.00)
Glucose: 110 mg/dL — ABNORMAL HIGH (ref 70–99)
Potassium: 4.2 mmol/L (ref 3.5–5.2)
Sodium: 145 mmol/L — ABNORMAL HIGH (ref 134–144)
eGFR: 50 mL/min/{1.73_m2} — ABNORMAL LOW (ref >59)

## 2023-02-24 LAB — CBC
Hematocrit: 40.4 % (ref 34.0–46.6)
Hemoglobin: 13 g/dL (ref 11.1–15.9)
MCH: 29.5 pg (ref 26.6–33.0)
MCHC: 32.2 g/dL (ref 31.5–35.7)
MCV: 92 fL (ref 79–97)
Platelets: 267 10*3/uL (ref 150–450)
RBC: 4.4 x10E6/uL (ref 3.77–5.28)
RDW: 12.5 % (ref 11.7–15.4)
WBC: 5.4 10*3/uL (ref 3.4–10.8)

## 2023-02-24 LAB — TSH RFX ON ABNORMAL TO FREE T4: TSH: 2.48 u[IU]/mL (ref 0.450–4.500)

## 2023-02-25 ENCOUNTER — Encounter: Payer: Self-pay | Admitting: Family Medicine

## 2023-03-03 ENCOUNTER — Other Ambulatory Visit: Payer: Self-pay | Admitting: Family Medicine

## 2023-03-03 DIAGNOSIS — M545 Low back pain, unspecified: Secondary | ICD-10-CM

## 2023-03-05 NOTE — Progress Notes (Unsigned)
    SUBJECTIVE:   CHIEF COMPLAINT / HPI:   Mild elevation in Cr  Thoracic and lumbar XR pending. Reassess after fall  PERTINENT  PMH / PSH: ***  OBJECTIVE:   There were no vitals taken for this visit. ***  General: NAD, pleasant, able to participate in exam Cardiac: RRR, no murmurs. Respiratory: CTAB, normal effort, No wheezes, rales or rhonchi Abdomen: Bowel sounds present, nontender, nondistended Extremities: no edema or cyanosis. Skin: warm and dry, no rashes noted Neuro: alert, no obvious focal deficits Psych: Normal affect and mood  ASSESSMENT/PLAN:   No problem-specific Assessment & Plan notes found for this encounter.     Dr. Elberta Fortis, DO [facility]    {    This will disappear when note is signed, click to select method of visit    :1}

## 2023-03-09 ENCOUNTER — Ambulatory Visit: Payer: Medicare HMO | Admitting: Family Medicine

## 2023-03-09 VITALS — BP 155/80 | HR 71 | Temp 98.5°F | Wt 184.0 lb

## 2023-03-09 DIAGNOSIS — R7989 Other specified abnormal findings of blood chemistry: Secondary | ICD-10-CM | POA: Diagnosis not present

## 2023-03-09 DIAGNOSIS — Z0000 Encounter for general adult medical examination without abnormal findings: Secondary | ICD-10-CM

## 2023-03-09 DIAGNOSIS — M545 Low back pain, unspecified: Secondary | ICD-10-CM | POA: Diagnosis not present

## 2023-03-09 DIAGNOSIS — Z111 Encounter for screening for respiratory tuberculosis: Secondary | ICD-10-CM | POA: Diagnosis not present

## 2023-03-09 DIAGNOSIS — I10 Essential (primary) hypertension: Secondary | ICD-10-CM | POA: Diagnosis not present

## 2023-03-09 NOTE — Assessment & Plan Note (Signed)
155/80, elevated and patient took HTN meds this AM. Recommend checking at home and returning to clinic if consistently >130 SBP.

## 2023-03-09 NOTE — Patient Instructions (Addendum)
It was wonderful to see you today! Thank you for choosing [facility].   Please bring ALL of your medications with you to every visit.   Today we talked about:  We are checking your kidneys today and if they have improved we can do another two week course of the pain medication. I would recommend this only for the next 2 weeks and after that time pain management can take over your chronic pain care. Please continue to take your blood pressure medication and if possible check your blood pressure at home.  Please follow up in 3 months  If you haven't already, sign up for My Chart to have easy access to your labs results, and communication with your primary care physician.   We are checking some labs today. If they are abnormal, I will call you. If they are normal, I will send you a MyChart message (if it is active) or a letter in the mail. If you do not hear about your labs in the next 2 weeks, please call the office.  Call the clinic at [phone] if your symptoms worsen or you have any concerns.  Please be sure to schedule follow up at the front desk before you leave today.   Elberta Fortis, DO Family Medicine

## 2023-03-09 NOTE — Progress Notes (Signed)
PPD placed in left arm on 03/09/2023 at 1520 by Glori Bickers.  Glennie Hawk, CMA

## 2023-03-10 LAB — BASIC METABOLIC PANEL
BUN/Creatinine Ratio: 14 (ref 12–28)
BUN: 15 mg/dL (ref 8–27)
CO2: 22 mmol/L (ref 20–29)
Calcium: 9.3 mg/dL (ref 8.7–10.3)
Chloride: 108 mmol/L — ABNORMAL HIGH (ref 96–106)
Creatinine, Ser: 1.04 mg/dL — ABNORMAL HIGH (ref 0.57–1.00)
Glucose: 74 mg/dL (ref 70–99)
Potassium: 4.4 mmol/L (ref 3.5–5.2)
Sodium: 146 mmol/L — ABNORMAL HIGH (ref 134–144)
eGFR: 57 mL/min/{1.73_m2} — ABNORMAL LOW (ref >59)

## 2023-03-11 ENCOUNTER — Encounter: Payer: Self-pay | Admitting: Family Medicine

## 2023-03-11 MED ORDER — NAPROXEN 500 MG PO TABS
500.0000 mg | ORAL_TABLET | Freq: Two times a day (BID) | ORAL | 0 refills | Status: DC
Start: 2023-03-11 — End: 2023-04-05

## 2023-03-11 NOTE — Addendum Note (Signed)
Addended by: Elberta Fortis on: 03/11/2023 09:20 AM   Modules accepted: Orders

## 2023-03-12 ENCOUNTER — Ambulatory Visit (INDEPENDENT_AMBULATORY_CARE_PROVIDER_SITE_OTHER): Payer: Medicare HMO

## 2023-03-12 DIAGNOSIS — Z111 Encounter for screening for respiratory tuberculosis: Secondary | ICD-10-CM

## 2023-03-12 LAB — TB SKIN TEST
Induration: 0 mm
TB Skin Test: NEGATIVE

## 2023-03-12 NOTE — Progress Notes (Signed)
Patient is here for a PPD read.  It was placed on 03/09/2023 in the left forearm @ 1520.    PPD RESULTS:  Result: negative Induration: 0 mm  Letter created and given to patient for documentation purposes. Veronda Prude, RN

## 2023-03-16 ENCOUNTER — Other Ambulatory Visit: Payer: Self-pay | Admitting: Family Medicine

## 2023-03-16 DIAGNOSIS — M545 Low back pain, unspecified: Secondary | ICD-10-CM

## 2023-03-17 ENCOUNTER — Encounter: Payer: Medicare HMO | Admitting: Physical Medicine and Rehabilitation

## 2023-03-18 ENCOUNTER — Other Ambulatory Visit: Payer: Self-pay | Admitting: Family Medicine

## 2023-03-18 DIAGNOSIS — Z1231 Encounter for screening mammogram for malignant neoplasm of breast: Secondary | ICD-10-CM

## 2023-03-25 IMAGING — MG DIGITAL DIAGNOSTIC UNILAT LEFT W/ CAD
4 series · 4 of 4 positions shown · non-contrast
Comparison: Previous exams including recent screening mammogram
dated 04/02/2021.

CLINICAL DATA: Patient returns today to evaluate LEFT breast
calcifications identified on a recent screening mammogram.

EXAM:
DIGITAL DIAGNOSTIC UNILATERAL LEFT MAMMOGRAM WITH CAD
TECHNIQUE: Left digital diagnostic mammography was performed. Mammographic
images were processed with CAD.

[L CC (1 of 2)]
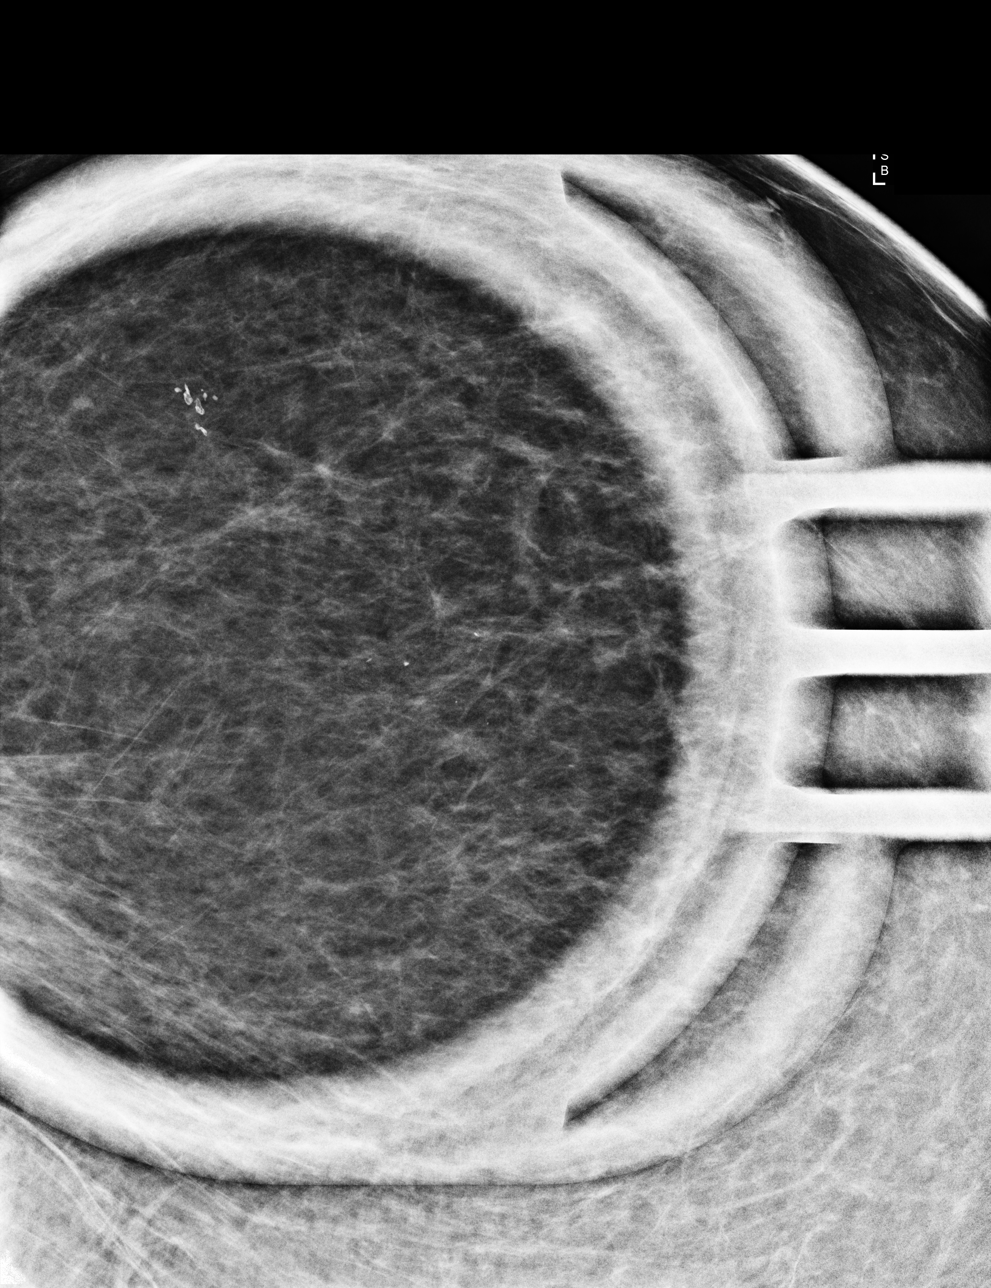

[L CC (2 of 2)]
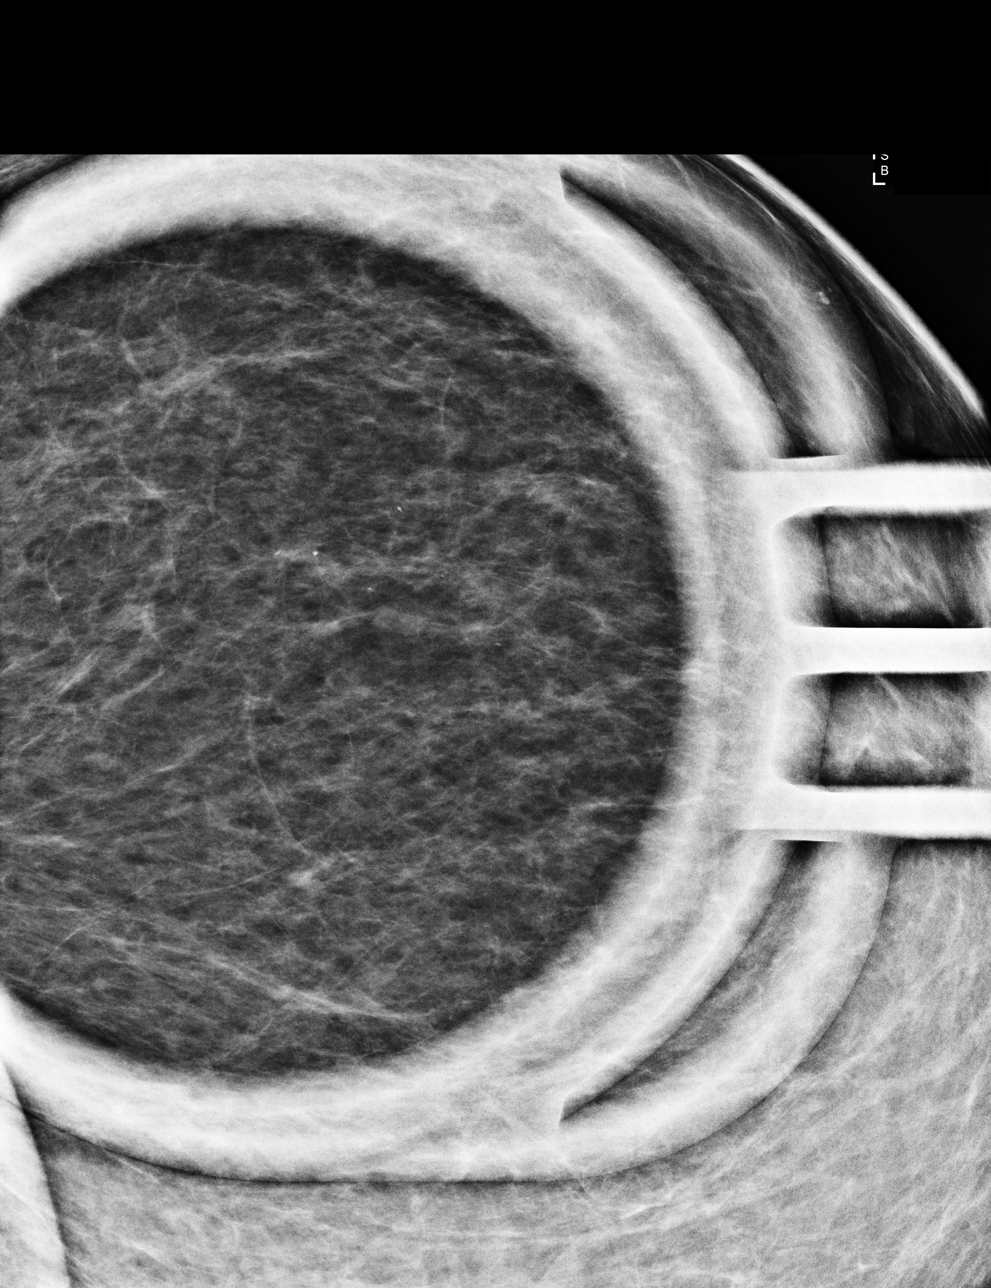

[L ML (1 of 2)]
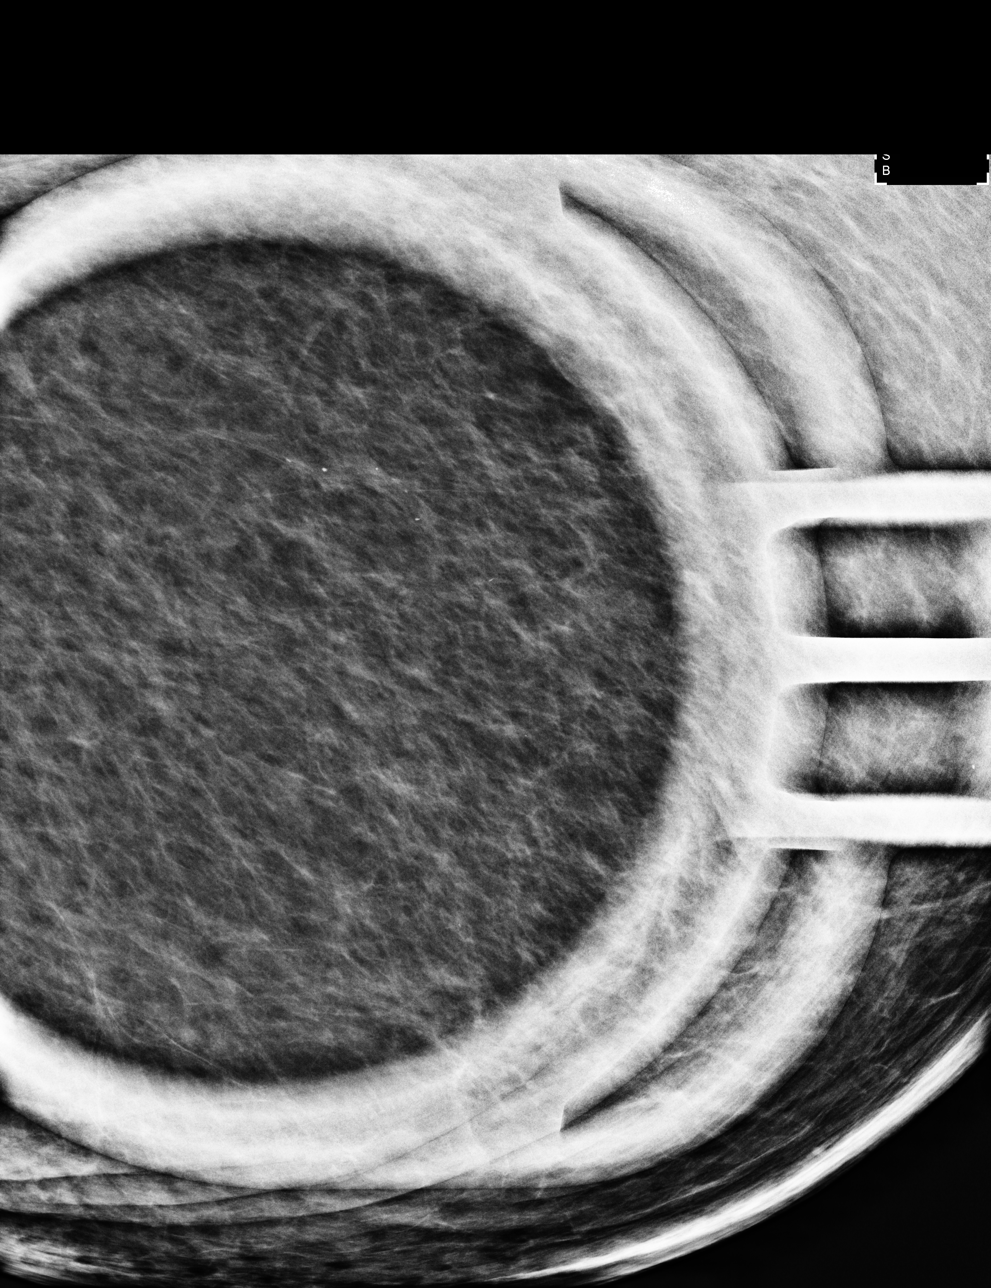

[L ML (2 of 2)]
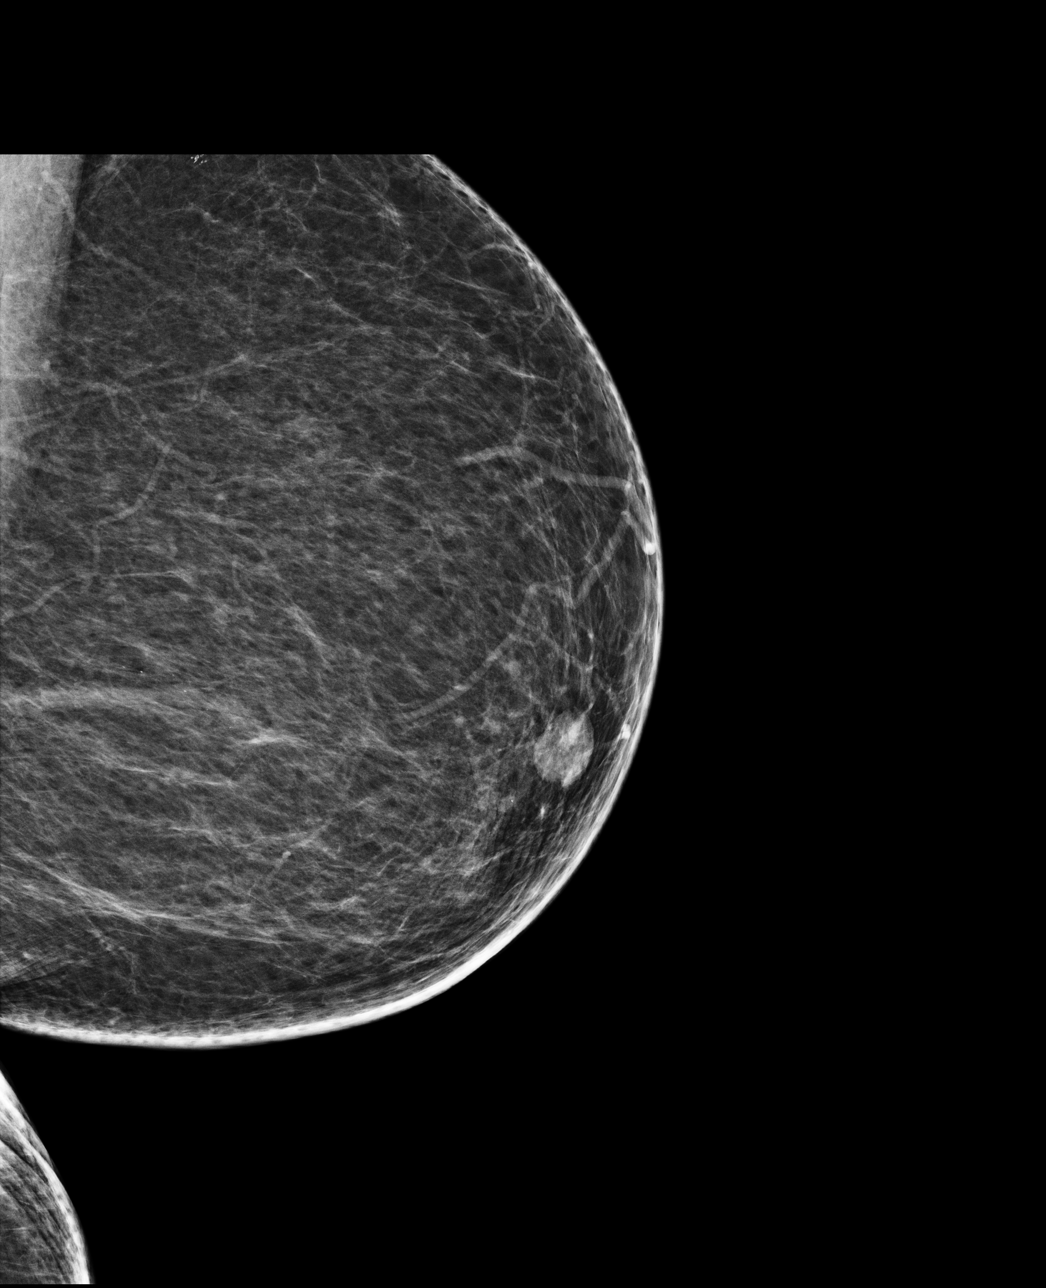

[4 of 4 positions shown; findings below may reference images not displayed]

ACR Breast Density Category b: There are scattered areas of
fibroglandular density.
FINDINGS: On today's additional diagnostic views, including magnification
views, several loosely grouped benign-appearing punctate
calcifications are confirmed within the outer LEFT breast. There are
no suspicious pleomorphic or fine linear branching calcifications.
IMPRESSION: No evidence of malignancy. Benign-appearing calcifications within
the outer LEFT breast. No suspicious calcifications.

Patient may return to routine annual bilateral screening mammogram
schedule.

RECOMMENDATION:
Screening mammogram in one year.(Code:[id number])

I have discussed the findings and recommendations with the patient.
If applicable, a reminder letter will be sent to the patient
regarding the next appointment.

BI-RADS CATEGORY  2: Benign.

## 2023-03-31 DIAGNOSIS — E119 Type 2 diabetes mellitus without complications: Secondary | ICD-10-CM | POA: Diagnosis not present

## 2023-04-05 ENCOUNTER — Encounter: Payer: Medicare HMO | Attending: Physical Medicine and Rehabilitation | Admitting: Physical Medicine and Rehabilitation

## 2023-04-05 ENCOUNTER — Encounter: Payer: Self-pay | Admitting: Physical Medicine and Rehabilitation

## 2023-04-05 VITALS — BP 160/71 | HR 53 | Ht 66.0 in | Wt 184.0 lb

## 2023-04-05 DIAGNOSIS — M48061 Spinal stenosis, lumbar region without neurogenic claudication: Secondary | ICD-10-CM | POA: Diagnosis not present

## 2023-04-05 DIAGNOSIS — M1990 Unspecified osteoarthritis, unspecified site: Secondary | ICD-10-CM | POA: Insufficient documentation

## 2023-04-05 DIAGNOSIS — Z789 Other specified health status: Secondary | ICD-10-CM | POA: Insufficient documentation

## 2023-04-05 DIAGNOSIS — Z7409 Other reduced mobility: Secondary | ICD-10-CM | POA: Insufficient documentation

## 2023-04-05 DIAGNOSIS — M25561 Pain in right knee: Secondary | ICD-10-CM | POA: Insufficient documentation

## 2023-04-05 MED ORDER — DICLOFENAC SODIUM 1 % EX GEL: 4.0000 g | Freq: Four times a day (QID) | CUTANEOUS | 5 refills | Status: DC

## 2023-04-05 NOTE — Progress Notes (Signed)
Subjective:    [name], female    DOB: 11/11/1950, 72 y.o.   MRN: [id number]  HPI  Candice Warner is a 72 y.o. year old female  who  has a past medical history of Arthritis, Depression, Hyperlipidemia, Hypertension, Hypokalemia, Hypothyroidism, Lumbar back pain, Obesity, Radiculopathy, Thyroid disease, and Type II diabetes mellitus (HCC).   They are presenting to PM&R clinic for follow up related to b/l low back and thigh pain   Plan from last visit:  Neuroforaminal stenosis of lumbar spine -     Ambulatory referral to Physical Therapy Continue Tylenol as needed for pain control.  Obtain Voltaren gel over-the-counter and apply it to your back, knees for pain up to 4 times daily. I will have you schedule an appointment with Dr. Wynn Banker in 2 to 3 months for epidural steroid injection.  Follow-up with me 2 months after appointment with Dr. Wynn Banker.  You can message me through MyChart or call me in the interim for any additional needs, and can make appointments in between if there are any specific issues.   Impaired mobility and ADLs -     Ambulatory referral to Physical Therapy PT referral for mobilization and pain control; if fails, will set up ESI as above No need to return for trigger point injections since they were not helpful.  No need to continue tizanidine.   Arthritis -     DG Knee 1-2 Views Left; Future -     Ambulatory referral to Physical Therapy Mild arthritis is seen in L knee xr 2017   I have ordered x-rays of your left knee to evaluate for arthritis, given pain and giving out.  If we see worsening arthritis, we may recommend a injection into the knee to help with that.     Type 2 diabetes mellitus without complication, without long-term current use of insulin -     Basic metabolic panel   Have BMP obtained for review of your renal function.  If things look good, I will call you and give instructions on increasing the gabapentin.  If not, we will  talk about medication alternatives.     Interval Hx:  - Therapies: Finished PT; went well and doing HEP. "I do them but then I get sore." Mostly back exercises. she has a hill to get to her mailbox and she has to stand at the top to keep her back from getting too painful.    - Follow ups: Bilateral L3-4 Lumbar transforaminal epidural steroid injection under fluoroscopic guidance  12/25/22 - she says it helped for a few days and then completely came back.    - Falls: One since last visit; she was hanging a mirror and fell on the floor on her buttocks. She did see her PCP because her buttocks briused but no break/concerning features. Thinks when she fell she hurt her right leg; it hurts on the medial knee but does seem to be getting better.    - WUJ:WJXB    - Medications: Gabapentin 300 mg BID. Has been on Naproxen fr awhile, ,    - Other concerns: Did not get xrays done of her knee last visit. Also wants hanicapped form today.   Pain Inventory Average Pain 9 Pain Right Now 10 My pain is sharp  In the last 24 hours, has pain interfered with the following? General activity 9 Relation with others 9 Enjoyment of life 10 What TIME of day is your pain at its  worst? daytime and night Sleep (in general) Fair  Pain is worse with: walking, bending, standing, and some activites Pain improves with: heat/ice and medication Relief from Meds: 2  Family History  Problem Relation Age of Onset   Heart disease Mother    Heart disease Father    Breast cancer Maternal Aunt    Social History   Socioeconomic History   Marital status: Widowed    Spouse name: Not on file   Number of children: Not on file   Years of education: Not on file   Highest education level: Not on file  Occupational History   Not on file  Tobacco Use   Smoking status: Never   Smokeless tobacco: Never  Vaping Use   Vaping status: Never Used  Substance and Sexual Activity   Alcohol use: Not Currently   Drug use:  Never   Sexual activity: Not Currently  Other Topics Concern   Not on file  Social History Narrative   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  Divorced, 4 children, 1 adopted.  Husband died 05/10/23 from colon CA.; Son murdered (hanging) 1999/08/10, father died 08/27/99.  Lives w/ 14yo son (adopted).    Social Determinants of Health   Financial Resource Strain: Low Risk  (07/15/2022)   Overall Financial Resource Strain (CARDIA)    Difficulty of Paying Living Expenses: Not hard at all  Food Insecurity: No Food Insecurity (07/15/2022)   Hunger Vital Sign    Worried About Running Out of Food in the Last Year: Never true    Ran Out of Food in the Last Year: Never true  Transportation Needs: No Transportation Needs (07/15/2022)   PRAPARE - Administrator, Civil Service (Medical): No    Lack of Transportation (Non-Medical): No  Physical Activity: Inactive (07/15/2022)   Exercise Vital Sign    Days of Exercise per Week: 0 days    Minutes of Exercise per Session: 0 min  Stress: No Stress Concern Present (07/15/2022)   [employer] of Occupational Health - Occupational Stress Questionnaire    Feeling of Stress : Only a little  Social Connections: Moderately Isolated (07/15/2022)   Social Connection and Isolation Panel [NHANES]    Frequency of Communication with Friends and Family: More than three times a week    Frequency of Social Gatherings with Friends and Family: Three times a week    Attends Religious Services: More than 4 times per year    Active Member of Clubs or Organizations: No    Attends Banker Meetings: Never    Marital Status: Widowed   Past Surgical History:  Procedure Laterality Date   CYST REMOVAL HAND Right 11/07/2015   Procedure: EXCISION OF VOLAR RETANICULUM RIGHT HAND;  Surgeon: Loreta Ave, MD;  Location: [facility];  Service: Orthopedics;  Laterality: Right;   SHOULDER ARTHROSCOPY Left 2002   SHOULDER ARTHROSCOPY WITH  ROTATOR CUFF REPAIR AND SUBACROMIAL DECOMPRESSION  07/14/2012   Procedure: SHOULDER ARTHROSCOPY WITH ROTATOR CUFF REPAIR AND SUBACROMIAL DECOMPRESSION;  Surgeon: Loreta Ave, MD;  Location: [facility];  Service: Orthopedics;  Laterality: Right;  Right Shoulder Arthroscopy, Distal Claviculectomy, Subacromial Decompression, Partial Acromioplasty with Coracoacromial Release with Arthroscopic Rotator Cuff Repair    THYROID LOBECTOMY Right    Goiter s/p right thyroidectomy   TRIGGER FINGER RELEASE Right 11/07/2015   Procedure: RELEASE TRIGGER FINGER/A-1 PULLEY RIGHT RING FINGER ;  Surgeon: Loreta Ave, MD;  Location:  [facility];  Service: Orthopedics;  Laterality: Right;   TUBAL LIGATION     Past Surgical History:  Procedure Laterality Date   CYST REMOVAL HAND Right 11/07/2015   Procedure: EXCISION OF VOLAR RETANICULUM RIGHT HAND;  Surgeon: Loreta Ave, MD;  Location: [facility];  Service: Orthopedics;  Laterality: Right;   SHOULDER ARTHROSCOPY Left 2002   SHOULDER ARTHROSCOPY WITH ROTATOR CUFF REPAIR AND SUBACROMIAL DECOMPRESSION  07/14/2012   Procedure: SHOULDER ARTHROSCOPY WITH ROTATOR CUFF REPAIR AND SUBACROMIAL DECOMPRESSION;  Surgeon: Loreta Ave, MD;  Location: [facility];  Service: Orthopedics;  Laterality: Right;  Right Shoulder Arthroscopy, Distal Claviculectomy, Subacromial Decompression, Partial Acromioplasty with Coracoacromial Release with Arthroscopic Rotator Cuff Repair    THYROID LOBECTOMY Right    Goiter s/p right thyroidectomy   TRIGGER FINGER RELEASE Right 11/07/2015   Procedure: RELEASE TRIGGER FINGER/A-1 PULLEY RIGHT RING FINGER ;  Surgeon: Loreta Ave, MD;  Location:  SURGERY CENTER;  Service: Orthopedics;  Laterality: Right;   TUBAL LIGATION     Past Medical History:  Diagnosis Date   Arthritis    "right hip" (06/27/2018)   Depression    Hyperlipidemia    Hypertension    Hypokalemia     Hypothyroidism    Lumbar back pain    Obesity    Radiculopathy    Thyroid disease    Type II diabetes mellitus (HCC)    There were no vitals taken for this visit.  Opioid Risk Score:   Fall Risk Score:  `1  Depression screen [facility] 2/9     02/23/2023    3:42 PM 12/25/2022   11:38 AM 08/05/2022    9:48 AM 07/23/2022    1:48 PM 07/22/2022   10:03 AM 07/15/2022    2:43 PM 05/14/2022   10:57 AM  Depression screen PHQ 2/9  Decreased Interest 3 0 0 1 1 1 1   Down, Depressed, Hopeless 2 0 0 0 0 1 1  PHQ - 2 Score 5 0 0 1 1 2 2   Altered sleeping 3   2 2  0 0  Tired, decreased energy 3   1 1 1 1   Change in appetite 3   0 0 0 0  Feeling bad or failure about yourself  0   0 0 0 0  Trouble concentrating 0   0 0 0 0  Moving slowly or fidgety/restless 0   0 0 0 0  Suicidal thoughts 0   0 0 0 0  PHQ-9 Score 14   4 4 3 3   Difficult doing work/chores Somewhat difficult   Somewhat difficult   Somewhat difficult      Review of Systems  Musculoskeletal:  Positive for back pain and gait problem.       Right knee pain   All other systems reviewed and are negative.     Objective:   Physical Exam PE: Constitution: Appropriate appearance for age. No apparent distress   Resp: No respiratory distress. No accessory muscle usage. on RA Cardio: Well perfused appearance. No peripheral edema. Abdomen: Nondistended. Nontender.   Psych: Appropriate mood and affect. Neuro: AAOx4. No apparent cognitive deficits    Neurologic Exam:   DTRs: Reflexes were 2+ in bilateral achilles, patella, biceps, BR and triceps. Babinsky: flexor responses b/l.   Hoffmans: negative b/l Sensory exam: revealed normal sensation in all dermatomal regions in bilateral lower extremities except medial right knee Motor exam: strength 5/5 throughout bilateral lower extremities  -improved  Coordination: Fine motor coordination was normal.   Gait: normal   R knee: TTP medial joint line, small palpable effusion. Strength intact. ROM  intact. Mildly + mcmurry's bu totherwise special tests negative.    Back Exam:   Inspection: Pelvis was  even.  Lumbar lordotic curvature was  reduced .  There was  no evidence of scoliosis.  Palpation: Palpatory exam revealed ttp at the bilateral PSIS,  and bilaterallumbar paraspinals . There was no evidence of spasm.    ROM revealed restricted ROM in extension of the back . Special/provocative testing:    SLR: -   Slump test: -    Facet loading: + Bilateral low back   TTP at paraspinals: Mild (sensitive for facet pain...if no ttp then likely not facet pain)          Assessment & Plan:   ASIANAE KEIGLEY is a 72 y.o. year old female  who  has a past medical history of Arthritis, Depression, Hyperlipidemia, Hypertension, Hypokalemia, Hypothyroidism, Lumbar back pain, Obesity, Radiculopathy, Thyroid disease, and Type II diabetes mellitus (HCC).   They are presenting to PM&R clinic for follow up related to b/l low back and thigh pain   Neuroforaminal stenosis of lumbar spine Stop naproxen due to concern for ongoing decrease in your kidney function; we will be transitioning you to Voltaren gel, which you can use topically on your low back and knees up to 4 times daily.  Continue gabapentin twice daily for your low back pain.  Follow-up with me in 3 to 6 months Impaired mobility and ADLs  I have written a prescription for a hinged knee brace for your right knee in the meantime, to help offload it during walking.  Obtain this at any medical supply store.  Acute pain of right knee Arthritis I am getting an x-ray of your right knee to look for any fracture, if it is safe to do so I would recommend a steroid injection into that knee for pain.  I will get you scheduled with Dr. Wynn Banker for this, as he is able to use some steroids that we will decrease the likelihood of blood glucose spikes.   Other orders -     Diclofenac Sodium; Apply 4 g topically 4 (four) times daily. Apply to knees  and low back up to 4 times daily  Dispense: 100 g; Refill: 5

## 2023-04-05 NOTE — Patient Instructions (Addendum)
Stop naproxen due to concern for ongoing decrease in your kidney function; we will be transitioning you to Voltaren gel, which you can use topically on your low back and knees up to 4 times daily.  Continue gabapentin twice daily for your low back pain.  I have written a prescription for a hinged knee brace for your right knee in the meantime, to help offload it during walking.  Obtain this at any medical supply store.  I am getting an x-ray of your right knee to look for any fracture, if it is safe to do so I would recommend a steroid injection into that knee for pain.  I will get you scheduled with Dr. Wynn Banker for this, as he is able to use some steroids that we will decrease the likelihood of blood glucose spikes.  Follow-up with me in 3 to 6 months

## 2023-04-07 DIAGNOSIS — E119 Type 2 diabetes mellitus without complications: Secondary | ICD-10-CM | POA: Diagnosis not present

## 2023-04-13 ENCOUNTER — Ambulatory Visit
Admission: RE | Admit: 2023-04-13 | Discharge: 2023-04-13 | Disposition: A | Discharge status: Home / Self Care | Payer: Medicare HMO | Source: Ambulatory Visit | Attending: Physical Medicine and Rehabilitation | Admitting: Physical Medicine and Rehabilitation

## 2023-04-13 ENCOUNTER — Encounter: Payer: Medicare HMO | Admitting: Physical Medicine & Rehabilitation

## 2023-04-13 ENCOUNTER — Encounter: Payer: Self-pay | Admitting: Physical Medicine & Rehabilitation

## 2023-04-13 DIAGNOSIS — M25561 Pain in right knee: Secondary | ICD-10-CM | POA: Diagnosis not present

## 2023-04-13 DIAGNOSIS — M1711 Unilateral primary osteoarthritis, right knee: Secondary | ICD-10-CM | POA: Diagnosis not present

## 2023-04-13 NOTE — Progress Notes (Unsigned)
PROCEDURE RECORD [facility]   Name: Candice Warner DOB:05-Mar-1951 MRN: [id number]  Date:04/13/2023  Physician: Claudette Laws, MD    Nurse/CMA: Nedra Hai, CMA  Allergies: No Known Allergies  Consent Signed: Yes.    Is patient diabetic? No.  CBG today? .  Pregnant: No. LMP: No LMP recorded. Patient is postmenopausal. (age [age])  Anticoagulants: no Anti-inflammatory: no Antibiotics: no  Procedure: Right knee steroid injection  Position: Prone Start Time: .  End Time: .  Fluoro Time: .  RN/CMA Nedra Hai, CMA Nedra Hai, CMA    Time 2:43 pm     BP 197/72     Pulse 55     Respirations 16 16    O2 Sat 98     S/S 6 6    Pain Level 10/10      D/C home with no one, patient A & O X 3, D/C instructions reviewed, and sits independently.

## 2023-04-13 NOTE — Progress Notes (Unsigned)
Pt scheduled for Right knee injection under fluoro  Pt had a fall ~6wks ago and Xray of Right knee was ordered.  Pt did not f/u with Xray.  Still has patellar pain  Have written another order and will plan US guided knee injection if xray negative for fracture

## 2023-04-21 ENCOUNTER — Ambulatory Visit: Payer: Medicare HMO

## 2023-04-29 DIAGNOSIS — E119 Type 2 diabetes mellitus without complications: Secondary | ICD-10-CM | POA: Diagnosis not present

## 2023-05-04 ENCOUNTER — Encounter: Payer: Self-pay | Admitting: Physical Medicine & Rehabilitation

## 2023-05-04 ENCOUNTER — Encounter: Payer: Medicare HMO | Attending: Physical Medicine and Rehabilitation | Admitting: Physical Medicine & Rehabilitation

## 2023-05-04 VITALS — BP 138/73 | HR 63 | Temp 98.3°F | Ht 66.0 in | Wt 181.0 lb

## 2023-05-04 DIAGNOSIS — M25561 Pain in right knee: Secondary | ICD-10-CM | POA: Diagnosis not present

## 2023-05-04 MED ORDER — LIDOCAINE HCL 1 % IJ SOLN
4.0000 mL | Freq: Once | INTRAMUSCULAR | Status: AC
Start: 2023-05-04 — End: 2023-05-04
  Administered 2023-05-04: 4 mL

## 2023-05-04 MED ORDER — BETAMETHASONE SOD PHOS & ACET 6 (3-3) MG/ML IJ SUSP
6.0000 mg | Freq: Once | INTRAMUSCULAR | Status: AC
Start: 2023-05-04 — End: 2023-05-04
  Administered 2023-05-04: 6 mg via INTRA_ARTICULAR

## 2023-05-04 NOTE — Progress Notes (Signed)
Knee injection With  ultrasound guidance)  Indication:right Knee pain not relieved by medication management and other conservative care.  Informed consent was obtained after describing risks and benefits of the procedure with the patient, this includes bleeding, bruising, infection and medication side effects. The patient wishes to proceed and has given written consent. The patient was placed in a recumbent position. The medial aspect of the knee was marked and prepped with Betadine and alcohol. It was then entered with a 25-gauge 1-1/2 inch needle and 1 mL of 1% lidocaine was injected into the skin and subcutaneous tissue. Then another 22g 50mm ECHO bloc needle was inserted into the knee joint under long axis guidance using 13Hz  hocky stick transducer . After negative draw back for blood, a solution containing one ML of 6mg  per mL betamethasone and 4 mL of 1% lidocaine were injected. The patient tolerated the procedure well. Post procedure instructions were given.

## 2023-05-07 ENCOUNTER — Other Ambulatory Visit: Payer: Self-pay | Admitting: Physical Medicine and Rehabilitation

## 2023-05-07 ENCOUNTER — Other Ambulatory Visit: Payer: Self-pay | Admitting: Family Medicine

## 2023-05-07 DIAGNOSIS — E039 Hypothyroidism, unspecified: Secondary | ICD-10-CM

## 2023-05-11 ENCOUNTER — Ambulatory Visit: Payer: Medicare HMO

## 2023-05-11 ENCOUNTER — Telehealth: Payer: Self-pay | Admitting: Physical Medicine and Rehabilitation

## 2023-05-11 NOTE — Telephone Encounter (Signed)
Reviewed by Dr Wynn Banker prior to injections; no acute fracture.

## 2023-05-17 ENCOUNTER — Other Ambulatory Visit: Payer: Self-pay | Admitting: Family Medicine

## 2023-05-25 DIAGNOSIS — E119 Type 2 diabetes mellitus without complications: Secondary | ICD-10-CM | POA: Diagnosis not present

## 2023-05-27 DIAGNOSIS — E119 Type 2 diabetes mellitus without complications: Secondary | ICD-10-CM | POA: Diagnosis not present

## 2023-06-02 ENCOUNTER — Other Ambulatory Visit: Payer: Self-pay | Admitting: Family Medicine

## 2023-06-02 DIAGNOSIS — M545 Low back pain, unspecified: Secondary | ICD-10-CM

## 2023-06-17 ENCOUNTER — Ambulatory Visit
Admission: RE | Admit: 2023-06-17 | Discharge: 2023-06-17 | Disposition: A | Discharge status: Home / Self Care | Payer: Medicare HMO | Source: Ambulatory Visit | Attending: Family Medicine | Admitting: Family Medicine

## 2023-06-17 DIAGNOSIS — Z1231 Encounter for screening mammogram for malignant neoplasm of breast: Secondary | ICD-10-CM

## 2023-06-21 ENCOUNTER — Encounter: Payer: Self-pay | Admitting: Family Medicine

## 2023-06-30 DIAGNOSIS — E119 Type 2 diabetes mellitus without complications: Secondary | ICD-10-CM | POA: Diagnosis not present

## 2023-07-05 ENCOUNTER — Encounter: Payer: Medicare HMO | Attending: Physical Medicine and Rehabilitation | Admitting: Physical Medicine and Rehabilitation

## 2023-07-05 VITALS — BP 163/68 | HR 53 | Ht 66.0 in | Wt 181.0 lb

## 2023-07-05 DIAGNOSIS — M48061 Spinal stenosis, lumbar region without neurogenic claudication: Secondary | ICD-10-CM | POA: Diagnosis not present

## 2023-07-05 DIAGNOSIS — M47819 Spondylosis without myelopathy or radiculopathy, site unspecified: Secondary | ICD-10-CM | POA: Insufficient documentation

## 2023-07-05 DIAGNOSIS — Z789 Other specified health status: Secondary | ICD-10-CM | POA: Diagnosis not present

## 2023-07-05 DIAGNOSIS — Z7409 Other reduced mobility: Secondary | ICD-10-CM | POA: Diagnosis not present

## 2023-07-05 DIAGNOSIS — M25561 Pain in right knee: Secondary | ICD-10-CM | POA: Diagnosis not present

## 2023-07-05 DIAGNOSIS — G894 Chronic pain syndrome: Secondary | ICD-10-CM | POA: Diagnosis not present

## 2023-07-05 NOTE — Progress Notes (Signed)
Subjective:    [name], female    DOB: 1951/01/17, 73 y.o.   MRN: [id number]  HPI  Candice Warner is a 73 y.o. year old female  who  has a past medical history of Arthritis, Depression, Hyperlipidemia, Hypertension, Hypokalemia, Hypothyroidism, Lumbar back pain, Obesity, Radiculopathy, Thyroid disease, and Type II diabetes mellitus (HCC).   They are presenting to PM&R clinic for follow up related to b/l low back and thigh pain   Plan from last visit: Neuroforaminal stenosis of lumbar spine Stop naproxen due to concern for ongoing decrease in your kidney function; we will be transitioning you to Voltaren gel, which you can use topically on your low back and knees up to 4 times daily.   Continue gabapentin twice daily for your low back pain.   Follow-up with me in 3 to 6 months Impaired mobility and ADLs   I have written a prescription for a hinged knee brace for your right knee in the meantime, to help offload it during walking.  Obtain this at any medical supply store.   Acute pain of right knee Arthritis I am getting an x-ray of your right knee to look for any fracture, if it is safe to do so I would recommend a steroid injection into that knee for pain.  I will get you scheduled with Dr. Wynn Banker for this, as he is able to use some steroids that we will decrease the likelihood of blood glucose spikes.   Interval Hx:  - Therapies: She is stretching and doing her HEP per PT; which helps. Walks her dog [name] every day but her low back limits how far she can go, especially because of the cold.    - Follow ups: R knee injection with Dr. Wynn Banker 11/19 - went "ok"; got pain relief for about 1 month.   Prior ESI with Dr. Wynn Banker last 7/12 did well, lasted a few weeks, about the same pain releif as the knee injection. She says when it worse off, her back didn't hurt as bad as it did before.   Says "it got the the point where it would only hurt at night; now it goes  down the front of my foot until my toes become numb". She now has pain during the daytime again, but nighttime is worse.    - Falls: One fall when it snowed, slipped trying to get into her [name] and landed on her left side. Was with her family, no ongoing pan or concerns.    - DME: She did not remember to fill her script for a hinged knee brace last time.    - Medications: Voltaren gel has been helping her right knee pain.  She says she takes Tylenol PM PRN for sleep and pain control  Gabapentin "is alright"; she does not notice a difference with it.    - Other concerns: none   Pain Inventory Average Pain 7 Pain Right Now 7 My pain is tingling  In the last 24 hours, has pain interfered with the following? General activity 0 Relation with others 0 Enjoyment of life 0 What TIME of day is your pain at its worst? night Sleep (in general) Fair  Pain is worse with: walking, bending, and some activites Pain improves with: rest and medication Relief from Meds: 5  Family History  Problem Relation Age of Onset   Heart disease Mother    Heart disease Father    Breast cancer Maternal Aunt  Social History   Socioeconomic History   Marital status: Widowed    Spouse name: Not on file   Number of children: Not on file   Years of education: Not on file   Highest education level: Not on file  Occupational History   Not on file  Tobacco Use   Smoking status: Never   Smokeless tobacco: Never  Vaping Use   Vaping status: Never Used  Substance and Sexual Activity   Alcohol use: Not Currently   Drug use: Never   Sexual activity: Not Currently  Other Topics Concern   Not on file  Social History Narrative   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  Divorced, 4 children, 1 adopted.  Husband died 06-May-2024 from colon CA.; Son murdered (hanging) 08/07/1999, father died 1999/08/08.  Lives w/ 14yo son (adopted).    Social Drivers of Corporate investment banker Strain: Low Risk   (07/15/2022)   Overall Financial Resource Strain (CARDIA)    Difficulty of Paying Living Expenses: Not hard at all  Food Insecurity: No Food Insecurity (07/15/2022)   Hunger Vital Sign    Worried About Running Out of Food in the Last Year: Never true    Ran Out of Food in the Last Year: Never true  Transportation Needs: No Transportation Needs (07/15/2022)   PRAPARE - Administrator, Civil Service (Medical): No    Lack of Transportation (Non-Medical): No  Physical Activity: Inactive (07/15/2022)   Exercise Vital Sign    Days of Exercise per Week: 0 days    Minutes of Exercise per Session: 0 min  Stress: No Stress Concern Present (07/15/2022)   [employer] of Occupational Health - Occupational Stress Questionnaire    Feeling of Stress : Only a little  Social Connections: Moderately Isolated (07/15/2022)   Social Connection and Isolation Panel [NHANES]    Frequency of Communication with Friends and Family: More than three times a week    Frequency of Social Gatherings with Friends and Family: Three times a week    Attends Religious Services: More than 4 times per year    Active Member of Clubs or Organizations: No    Attends Banker Meetings: Never    Marital Status: Widowed   Past Surgical History:  Procedure Laterality Date   CYST REMOVAL HAND Right 11/07/2015   Procedure: EXCISION OF VOLAR RETANICULUM RIGHT HAND;  Surgeon: Loreta Ave, MD;  Location: [facility];  Service: Orthopedics;  Laterality: Right;   SHOULDER ARTHROSCOPY Left 08/07/00   SHOULDER ARTHROSCOPY WITH ROTATOR CUFF REPAIR AND SUBACROMIAL DECOMPRESSION  07/14/2012   Procedure: SHOULDER ARTHROSCOPY WITH ROTATOR CUFF REPAIR AND SUBACROMIAL DECOMPRESSION;  Surgeon: Loreta Ave, MD;  Location: [facility];  Service: Orthopedics;  Laterality: Right;  Right Shoulder Arthroscopy, Distal Claviculectomy, Subacromial Decompression, Partial Acromioplasty with Coracoacromial  Release with Arthroscopic Rotator Cuff Repair    THYROID LOBECTOMY Right    Goiter s/p right thyroidectomy   TRIGGER FINGER RELEASE Right 11/07/2015   Procedure: RELEASE TRIGGER FINGER/A-1 PULLEY RIGHT RING FINGER ;  Surgeon: Loreta Ave, MD;  Location: [facility];  Service: Orthopedics;  Laterality: Right;   TUBAL LIGATION     Past Surgical History:  Procedure Laterality Date   CYST REMOVAL HAND Right 11/07/2015   Procedure: EXCISION OF VOLAR RETANICULUM RIGHT HAND;  Surgeon: Loreta Ave, MD;  Location: [facility];  Service: Orthopedics;  Laterality: Right;  SHOULDER ARTHROSCOPY Left 2002   SHOULDER ARTHROSCOPY WITH ROTATOR CUFF REPAIR AND SUBACROMIAL DECOMPRESSION  07/14/2012   Procedure: SHOULDER ARTHROSCOPY WITH ROTATOR CUFF REPAIR AND SUBACROMIAL DECOMPRESSION;  Surgeon: Loreta Ave, MD;  Location: [facility];  Service: Orthopedics;  Laterality: Right;  Right Shoulder Arthroscopy, Distal Claviculectomy, Subacromial Decompression, Partial Acromioplasty with Coracoacromial Release with Arthroscopic Rotator Cuff Repair    THYROID LOBECTOMY Right    Goiter s/p right thyroidectomy   TRIGGER FINGER RELEASE Right 11/07/2015   Procedure: RELEASE TRIGGER FINGER/A-1 PULLEY RIGHT RING FINGER ;  Surgeon: Loreta Ave, MD;  Location: [facility];  Service: Orthopedics;  Laterality: Right;   TUBAL LIGATION     Past Medical History:  Diagnosis Date   Arthritis    "right hip" (06/27/2018)   Depression    Hyperlipidemia    Hypertension    Hypokalemia    Hypothyroidism    Lumbar back pain    Obesity    Radiculopathy    Thyroid disease    Type II diabetes mellitus (HCC)    Ht 5\' 6"  (1.676 m)   BMI 29.21 kg/m   Opioid Risk Score:   Fall Risk Score:  `1  Depression screen [facility] 2/9     05/04/2023    9:22 AM 04/05/2023    9:09 AM 02/23/2023    3:42 PM 12/25/2022   11:38 AM 08/05/2022    9:48 AM 07/23/2022    1:48 PM  07/22/2022   10:03 AM  Depression screen PHQ 2/9  Decreased Interest 0 0 3 0 0 1 1  Down, Depressed, Hopeless 0 0 2 0 0 0 0  PHQ - 2 Score 0 0 5 0 0 1 1  Altered sleeping   3   2 2   Tired, decreased energy   3   1 1   Change in appetite   3   0 0  Feeling bad or failure about yourself    0   0 0  Trouble concentrating   0   0 0  Moving slowly or fidgety/restless   0   0 0  Suicidal thoughts   0   0 0  PHQ-9 Score   14   4 4   Difficult doing work/chores   Somewhat difficult   Somewhat difficult       Review of Systems  Musculoskeletal:  Positive for gait problem.       Right knee radiating to feet   All other systems reviewed and are negative.     Objective:   Physical Exam   Constitution: Appropriate appearance for age. No apparent distress   Resp: No respiratory distress. No accessory muscle usage. on RA Cardio: Well perfused appearance. No peripheral edema. Abdomen: Nondistended. Nontender.   Psych: Appropriate mood and affect. Neuro: AAOx4. No apparent cognitive deficits    Neurologic Exam:   Sensory exam: revealed normal sensation in all dermatomal regions in bilateral lower extremities  Motor exam: strength 5/5 throughout bilateral lower extremities Coordination: Fine motor coordination was normal.   Gait: normal, small catch with right knee but stable   R knee: TTP medial and lateal joint line, small palpable effusion laterally--unchanged. Strength intact. ROM intact.    Back Exam:   Inspection: Pelvis was  even.  Lumbar lordotic curvature was  reduced .  There was  no evidence of scoliosis.  Palpation: Palpatory exam revealed no ttp in neutral position . There was no evidence of spasm.    ROM  revealed restricted ROM in extension of the back . Special/provocative testing:    SLR: -   Slump test: + low back pain   Facet loading: + Bilateral low back   TTP at paraspinals: Mild (sensitive for facet pain...if no ttp then likely not facet pain), R>L        Assessment & Plan:   Candice Warner is a 73 y.o. year old female  who  has a past medical history of Arthritis, Depression, Hyperlipidemia, Hypertension, Hypokalemia, Hypothyroidism, Lumbar back pain, Obesity, Radiculopathy, Thyroid disease, and Type II diabetes mellitus (HCC).   They are presenting to PM&R clinic for follow up related to b/l low back with R thigh radicular pain and R knee pain with mild OA/chondrocalcinosis.    Chronic pain syndrome Facet arthropathy, multilevel Neuroforaminal stenosis of lumbar spine MRI lumbar spine 08/05/22: IMPRESSION: 1. Widespread lumbar spine degeneration. Mild levoconvex lumbar scoliosis and subtle anterolisthesis of L4 on L5. 2. Severe multifactorial spinal, lateral recess, and foraminal stenosis at L3-L4. Query L3 and/or L4 radiculitis. 3. Mild spinal stenosis at both L2-L3 and L4-L5. And moderate to severe neural foraminal stenosis also at the bilateral L2 and left L5 nerve levels. 4. Fibroid uterus. Diverticulosis of the distal large bowel.  I recommend returning to Dr. Wynn Banker for epidural steroid injections, since this seemed to help your pain the most and for the longest duration, and since back pain is more limiting than knee pain. If this does not help at next appointment, we will discuss neurosurgical referral  Continune voltaren gel and tylenol PM  Continue gabapentin at current dose.   Follow up with me in 3 months.   Impaired mobility and ADLs I recommend using your home knee sleeve for additional support and management of swelling. If knee sleeve does not help, message or call me and I will write another script for a hinged brace.  Continue home exercises.   Right knee pain, unspecified chronicity R knee xray 04/13/23:  IMPRESSION: 1. No acute fracture or subluxation of the right knee. 2. Mild tricompartmental osteoarthritis. 3. Faint chondrocalcinosis.    Discuss simvisc injections in the right knee with Dr. Wynn Banker  next visit.

## 2023-07-05 NOTE — Patient Instructions (Addendum)
I recommend returning to Dr. Wynn Banker for epidural steroid injections, since this seemed to help your pain the most and for the longest duration, and since back pain is more limiting than knee pain. If this does not help at next appointment, we will discuss nuerosurgiical referral  You can also discuss simvisc injections in the right knee with him next visit.  I recommend using your home knee sleeve for additional support and management of swelling. If knee sleeve does not help, message or call me and I will write another script for a hinged brace.  Continune voltaren gel and tylenol PM  Continue gabapentin at current dose.   Continue home exercises.   Follow up with me in 3 months.

## 2023-07-06 ENCOUNTER — Other Ambulatory Visit: Payer: Self-pay

## 2023-07-06 DIAGNOSIS — E119 Type 2 diabetes mellitus without complications: Secondary | ICD-10-CM

## 2023-07-06 MED ORDER — ONETOUCH ULTRASOFT LANCETS MISC
6 refills | Status: DC
Start: 2023-07-06 — End: 2023-08-17

## 2023-07-06 NOTE — Telephone Encounter (Signed)
Patient calls nurse line requesting refill on lancets.   Please include number of times per day patient should be checking in directions, per insurance guidelines.   Veronda Prude, RN

## 2023-07-19 ENCOUNTER — Ambulatory Visit (INDEPENDENT_AMBULATORY_CARE_PROVIDER_SITE_OTHER): Payer: Medicare HMO

## 2023-07-19 VITALS — Ht 66.0 in | Wt 181.0 lb

## 2023-07-19 DIAGNOSIS — Z0000 Encounter for general adult medical examination without abnormal findings: Secondary | ICD-10-CM

## 2023-07-19 NOTE — Patient Instructions (Addendum)
Candice Warner , Thank you for taking time to come for your Medicare Wellness Visit. I appreciate your ongoing commitment to your health goals. Please review the following plan we discussed and let me know if I can assist you in the future.   Referrals/Orders/Follow-Ups/Clinician Recommendations: Yes; Keep maintaining your health by keeping your appointments with Dr. Ardyth Harps and any specialists that you may see.  Call us if you need anything.  Have a great year!!!!  This is a list of the screening recommended for you and due dates:  Health Maintenance  Topic Date Due   Zoster (Shingles) Vaccine (2 of 2) 08/07/2021   COVID-19 Vaccine (5 - 2024-25 season) 02/14/2023   Yearly kidney health urinalysis for diabetes  07/24/2023   Hemoglobin A1C  08/23/2023   Eye exam for diabetics  12/04/2023   Complete foot exam   02/23/2024   Yearly kidney function blood test for diabetes  03/08/2024   Medicare Annual Wellness Visit  07/18/2024   Mammogram  06/16/2025   Colon Cancer Screening  07/10/2026   DTaP/Tdap/Td vaccine (4 - Td or Tdap) 06/27/2028   Pneumonia Vaccine  Completed   Flu Shot  Completed   DEXA scan (bone density measurement)  Completed   Hepatitis C Screening  Completed   HPV Vaccine  Aged Out    Advanced directives: (Copy Requested) Please bring a copy of your health care power of attorney and living will to the office to be added to your chart at your convenience.  Next Medicare Annual Wellness Visit scheduled for next year: Yes; It was nice speaking with you today! Your next Annual Wellness Visit is scheduled for 07/20/2024 at 1:40 a.m. via telephone. If you need to reschedule or cancel, please call [phone].

## 2023-07-19 NOTE — Progress Notes (Cosign Needed)
Subjective:   Candice Warner is a 73 y.o. female who presents for Medicare Annual (Subsequent) preventive examination.  Visit Complete: Virtual I connected with  [name] on 07/19/23 by a audio enabled telemedicine application and verified that I am speaking with the correct person using two identifiers.  Patient Location: Home  Provider Location: Office/Clinic  I discussed the limitations of evaluation and management by telemedicine. The patient expressed understanding and agreed to proceed.  Vital Signs: Because this visit was a virtual/telehealth visit, some criteria may be missing or patient reported. Any vitals not documented were not able to be obtained and vitals that have been documented are patient reported.  This patient declined Interactive audio and Acupuncturist. Therefore the visit was completed with audio only.  Cardiac Risk Factors include: advanced age (>40men, >48 women);diabetes mellitus;dyslipidemia;family history of premature cardiovascular disease;hypertension     Objective:    Today's Vitals   07/19/23 1341 07/19/23 1354  Weight: 181 lb (82.1 kg)   Height: 5\' 6"  (1.676 m)   PainSc: 8  8   PainLoc: Back    Body mass index is 29.21 kg/m.     07/19/2023    1:45 PM 02/23/2023    3:43 PM 10/01/2022   11:04 AM 07/23/2022    1:34 PM 07/15/2022    2:46 PM 04/10/2022    9:24 AM 03/10/2022    9:57 AM  Advanced Directives  Does Patient Have a Medical Advance Directive? Yes No Yes Yes No;Yes No No  Type of Estate agent of [place];Living will    Healthcare Power of [place];Living will    Does patient want to make changes to medical advance directive?    Yes (ED - Information included in AVS) Yes (Inpatient - patient defers changing a medical advance directive and declines information at this time)    Copy of Healthcare Power of Attorney in Chart? No - copy requested   Yes - validated most recent copy scanned in chart (See row  information) Yes - validated most recent copy scanned in chart (See row information)    Would patient like information on creating a medical advance directive?    No - Patient declined Yes (Inpatient - patient defers creating a medical advance directive and declines information at this time) No - Patient declined No - Patient declined    Current Medications (verified) Outpatient Encounter Medications as of 07/19/2023  Medication Sig   amLODipine (NORVASC) 10 MG tablet TAKE 1 TABLET BY MOUTH EVERYDAY AT BEDTIME   aspirin EC 81 MG tablet Take 1 tablet by mouth daily.   atorvastatin (LIPITOR) 40 MG tablet Take 1 tablet (40 mg total) by mouth daily.   Blood Glucose Monitoring Suppl W/DEVICE KIT Test blood sugars every morning prior to eating or drinking.   diclofenac Sodium (VOLTAREN ARTHRITIS PAIN) 1 % GEL Apply 4 g topically 4 (four) times daily. Apply to knees and low back up to 4 times daily   gabapentin (NEURONTIN) 300 MG capsule TAKE 1 CAPSULE BY MOUTH TWICE A DAY   glucose 4 GM chewable tablet Chew 1 tablet (4 g total) by mouth as needed for low blood sugar.   Lancets (ONETOUCH ULTRASOFT) lancets Please check fasting blood sugar daily.   levothyroxine (SYNTHROID) 50 MCG tablet TAKE 1 TABLET BY MOUTH EVERY DAY   lisinopril (ZESTRIL) 20 MG tablet TAKE 1 TABLET BY MOUTH EVERY DAY   loratadine (CLARITIN) 10 MG tablet Take 1 tablet by mouth daily.  metFORMIN (GLUCOPHAGE-XR) 500 MG 24 hr tablet Take 2 tablets (1,000 mg total) by mouth 2 (two) times daily with a meal.   ONETOUCH VERIO test strip USE AS INSTRUCTED   sertraline (ZOLOFT) 100 MG tablet Take 0.5 tablets (50 mg total) by mouth daily.   No facility-administered encounter medications on file as of 08-11-2023.    Allergies (verified) Patient has no known allergies.   History: Past Medical History:  Diagnosis Date   Arthritis    "right hip" (06/27/2018)   Depression    Hyperlipidemia    Hypertension    Hypokalemia     Hypothyroidism    Lumbar back pain    Obesity    Radiculopathy    Thyroid disease    Type II diabetes mellitus (HCC)    Past Surgical History:  Procedure Laterality Date   CYST REMOVAL HAND Right 11/07/2015   Procedure: EXCISION OF VOLAR RETANICULUM RIGHT HAND;  Surgeon: Loreta Ave, MD;  Location: [facility];  Service: Orthopedics;  Laterality: Right;   SHOULDER ARTHROSCOPY Left 08-17-00   SHOULDER ARTHROSCOPY WITH ROTATOR CUFF REPAIR AND SUBACROMIAL DECOMPRESSION  07/14/2012   Procedure: SHOULDER ARTHROSCOPY WITH ROTATOR CUFF REPAIR AND SUBACROMIAL DECOMPRESSION;  Surgeon: Loreta Ave, MD;  Location: [facility];  Service: Orthopedics;  Laterality: Right;  Right Shoulder Arthroscopy, Distal Claviculectomy, Subacromial Decompression, Partial Acromioplasty with Coracoacromial Release with Arthroscopic Rotator Cuff Repair    THYROID LOBECTOMY Right    Goiter s/p right thyroidectomy   TRIGGER FINGER RELEASE Right 11/07/2015   Procedure: RELEASE TRIGGER FINGER/A-1 PULLEY RIGHT RING FINGER ;  Surgeon: Loreta Ave, MD;  Location:  SURGERY CENTER;  Service: Orthopedics;  Laterality: Right;   TUBAL LIGATION     Family History  Problem Relation Age of Onset   Heart disease Mother    Heart disease Father    Breast cancer Maternal Aunt    Social History   Socioeconomic History   Marital status: Widowed    Spouse name: Not on file   Number of children: Not on file   Years of education: Not on file   Highest education level: Not on file  Occupational History   Not on file  Tobacco Use   Smoking status: Never   Smokeless tobacco: Never  Vaping Use   Vaping status: Never Used  Substance and Sexual Activity   Alcohol use: Not Currently   Drug use: Never   Sexual activity: Not Currently  Other Topics Concern   Not on file  Social History Narrative   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  Divorced, 4 children, 1 adopted.  Husband  died 05-10-2024 from colon CA.; Son murdered (hanging) 08/11/1999, father died 1999-08-18.  Lives w/ 14yo son (adopted).    Social Drivers of Corporate investment banker Strain: Low Risk  (11-Aug-2023)   Overall Financial Resource Strain (CARDIA)    Difficulty of Paying Living Expenses: Not hard at all  Food Insecurity: No Food Insecurity (08-11-23)   Hunger Vital Sign    Worried About Running Out of Food in the Last Year: Never true    Ran Out of Food in the Last Year: Never true  Transportation Needs: No Transportation Needs (2023-08-11)   PRAPARE - Administrator, Civil Service (Medical): No    Lack of Transportation (Non-Medical): No  Physical Activity: Sufficiently Active (Aug 11, 2023)   Exercise Vital Sign    Days of Exercise per Week:  5 days    Minutes of Exercise per Session: 30 min  Stress: No Stress Concern Present (07/19/2023)   [employer] of Occupational Health - Occupational Stress Questionnaire    Feeling of Stress : Only a little  Social Connections: Moderately Isolated (07/19/2023)   Social Connection and Isolation Panel [NHANES]    Frequency of Communication with Friends and Family: More than three times a week    Frequency of Social Gatherings with Friends and Family: Three times a week    Attends Religious Services: More than 4 times per year    Active Member of Clubs or Organizations: No    Attends Banker Meetings: Never    Marital Status: Widowed    Tobacco Counseling Counseling given: Not Answered   Clinical Intake:  Pre-visit preparation completed: Yes  Pain : 0-10 Pain Score: 8  Pain Type: Chronic pain Pain Location: Knee Pain Orientation: Right     BMI - recorded: 29.21 Nutritional Risks: None Diabetes: Yes CBG done?:  (157 FASTING) CBG resulted in Enter/ Edit results?: No Did pt. bring in CBG monitor from home?: No  How often do you need to have someone help you when you read instructions, pamphlets, or other written materials  from your doctor or pharmacy?: 1 - Never What is the last grade level you completed in school?: HSG  Interpreter Needed?: No  Information entered by :: Susie Cassette, LPN.   Activities of Daily Living    07/19/2023    1:48 PM  In your present state of health, do you have any difficulty performing the following activities:  Hearing? 1  Comment ringing in ears  Vision? 0  Difficulty concentrating or making decisions? 0  Walking or climbing stairs? 0  Dressing or bathing? 0  Doing errands, shopping? 0  Preparing Food and eating ? N  Using the Toilet? N  In the past six months, have you accidently leaked urine? N  Do you have problems with loss of bowel control? N  Managing your Medications? N  Managing your Finances? N  Housekeeping or managing your Housekeeping? N    Patient Care Team: Elberta Fortis, MD as PCP - General (Family Medicine) [facility] as Referring Physician (Optometry)  Indicate any recent Medical Services you may have received from other than [facility] providers in the past year (date may be approximate).     Assessment:   This is a routine wellness examination for [place].  Hearing/Vision screen Hearing Screening - Comments:: Patient stated ringing in both ears. No hearing aids.  Vision Screening - Comments:: Patient wears corrective lenses.  Annual eye exam done by: [facility]    Goals Addressed             This Visit's Progress    My goal for 2025 is to keep exercising and staying independent.        Depression Screen    07/19/2023    1:49 PM 07/05/2023    8:58 AM 05/04/2023    9:22 AM 04/05/2023    9:09 AM 02/23/2023    3:42 PM 12/25/2022   11:38 AM 08/05/2022    9:48 AM  PHQ 2/9 Scores  PHQ - 2 Score 1 0 0 0 5 0 0  PHQ- 9 Score 7    14      Fall Risk    07/19/2023    1:47 PM 07/05/2023    8:58 AM 05/04/2023    9:22 AM 04/13/2023  2:37 PM 04/05/2023    9:09 AM  Fall Risk   Falls in the past year? 1 0  0 1 0  Number falls in past yr: 1   0   Comment 3 falls      Injury with Fall? 0   1   Comment    knee and buttbone   Risk for fall due to : No Fall Risks      Follow up Falls prevention discussed        MEDICARE RISK AT HOME: Medicare Risk at Home Any stairs in or around the home?: No If so, are there any without handrails?: No Home free of loose throw rugs in walkways, pet beds, electrical cords, etc?: Yes Adequate lighting in your home to reduce risk of falls?: Yes Life alert?: No Use of a cane, walker or w/c?: No Grab bars in the bathroom?: Yes Shower chair or bench in shower?: No Elevated toilet seat or a handicapped toilet?: Yes  TIMED UP AND GO:  Was the test performed?  No    Cognitive Function:    07/19/2023    1:48 PM  MMSE - Mini Mental State Exam  Not completed: Unable to complete        07/19/2023    1:48 PM 07/15/2022    2:51 PM  6CIT Screen  What Year? 0 points 0 points  What month? 0 points 0 points  What time? 0 points 0 points  Count back from 20 0 points 0 points  Months in reverse 0 points 0 points  Repeat phrase 0 points 2 points  Total Score 0 points 2 points    Immunizations Immunization History  Administered Date(s) Administered   Fluad Quad(high Dose 65+) 02/28/2020, 03/10/2022   Fluad Trivalent(High Dose 65+) 02/23/2023   Influenza,inj,Quad PF,6+ Mos 08/27/2014, 06/21/2015, 04/13/2016, 04/21/2017, 03/15/2018, 03/09/2019   PFIZER(Purple Top)SARS-COV-2 Vaccination 08/17/2019, 09/13/2019   PPD Test 01/30/2020, 10/21/2020, 10/29/2020, 03/09/2023   Pfizer Covid-19 Vaccine Bivalent Booster 69yrs & up 04/20/2021   Pfizer(Comirnaty)Fall Seasonal Vaccine 12 years and older 05/14/2022   Pneumococcal Conjugate-13 08/03/2017   Pneumococcal Polysaccharide-23 04/13/2016   Td 09/13/1996   Tdap 11/30/2011, 06/27/2018   Zoster Recombinant(Shingrix) 06/12/2021    TDAP status: Up to date  Flu Vaccine status: Up to date  Pneumococcal vaccine  status: Up to date  Covid-19 vaccine status: Completed vaccines  Qualifies for Shingles Vaccine? Yes   Zostavax completed No   Shingrix Completed?: No.    Education has been provided regarding the importance of this vaccine. Patient has been advised to call insurance company to determine out of pocket expense if they have not yet received this vaccine. Advised may also receive vaccine at local pharmacy or Health Dept. Verbalized acceptance and understanding.  Screening Tests Health Maintenance  Topic Date Due   Zoster Vaccines- Shingrix (2 of 2) 08/07/2021   COVID-19 Vaccine (5 - 2024-25 season) 02/14/2023   Diabetic kidney evaluation - Urine ACR  07/24/2023   HEMOGLOBIN A1C  08/23/2023   OPHTHALMOLOGY EXAM  12/04/2023   FOOT EXAM  02/23/2024   Diabetic kidney evaluation - eGFR measurement  03/08/2024   Medicare Annual Wellness (AWV)  07/18/2024   MAMMOGRAM  06/16/2025   Colonoscopy  07/10/2026   DTaP/Tdap/Td (4 - Td or Tdap) 06/27/2028   Pneumonia Vaccine 53+ Years old  Completed   INFLUENZA VACCINE  Completed   DEXA SCAN  Completed   Hepatitis C Screening  Completed   HPV VACCINES  Aged Out  Health Maintenance  Health Maintenance Due  Topic Date Due   Zoster Vaccines- Shingrix (2 of 2) 08/07/2021   COVID-19 Vaccine (5 - 2024-25 season) 02/14/2023   Diabetic kidney evaluation - Urine ACR  07/24/2023    Colorectal cancer screening: Type of screening: Colonoscopy. Completed 07/10/2016. Repeat every 10 years  Mammogram status: Completed 06/17/2023. Repeat every year-due every 2 years  Bone Density status: Completed 04/21/2016. Results reflect: Bone density results: NORMAL. Repeat every 5 years.  Lung Cancer Screening: (Low Dose CT Chest recommended if Age 29-80 years, 20 pack-year currently smoking OR have quit w/in 15years.) does not qualify.   Lung Cancer Screening Referral: no  Additional Screening:  Hepatitis C Screening: does qualify; Completed 04/26/2015  Vision  Screening: Recommended annual ophthalmology exams for early detection of glaucoma and other disorders of the eye. Is the patient up to date with their annual eye exam?  Yes  Who is the provider or what is the name of the office in which the patient attends annual eye exams? [facility] If pt is not established with a provider, would they like to be referred to a provider to establish care? No .   Dental Screening: Recommended annual dental exams for proper oral hygiene  Diabetic Foot Exam: Diabetic Foot Exam: Completed 02/23/2023  Community Resource Referral / Chronic Care Management: CRR required this visit?  No   CCM required this visit?  No     Plan:     I have personally reviewed and noted the following in the patient's chart:   Medical and social history Use of alcohol, tobacco or illicit drugs  Current medications and supplements including opioid prescriptions. Patient is not currently taking opioid prescriptions. Functional ability and status Nutritional status Physical activity Advanced directives List of other physicians Hospitalizations, surgeries, and ER visits in previous 12 months Vitals Screenings to include cognitive, depression, and falls Referrals and appointments  In addition, I have reviewed and discussed with patient certain preventive protocols, quality metrics, and best practice recommendations. A written personalized care plan for preventive services as well as general preventive health recommendations were provided to patient.     Mickeal Needy, LPN   06/20/1094   After Visit Summary: (Declined) Due to this being a telephonic visit, with patients personalized plan was offered to patient but patient Declined AVS at this time   Nurse Notes: Patient voiced her concerns during AWV that she needed a refill on her OneTouch Verio Testing Strips to be sent to [facility]  Also patient is due for Diabetic kidney Evaluation  and Shingrix vaccine.

## 2023-08-02 DIAGNOSIS — E119 Type 2 diabetes mellitus without complications: Secondary | ICD-10-CM | POA: Diagnosis not present

## 2023-08-05 NOTE — Progress Notes (Unsigned)
  PROCEDURE RECORD [facility]   Name: Candice Warner DOB:05/19/1951 MRN: [id number]  Date:08/05/2023  Physician: Claudette Laws, MD    Nurse/CMA: Royal RMA  Allergies: No Known Allergies  Consent Signed: Yes.    Is patient diabetic? Yes.    CBG today? 145  Pregnant: No. LMP: No LMP recorded. Patient is postmenopausal. (age 73-55)  Anticoagulants: no Anti-inflammatory: no Antibiotics: no  Procedure: Bilateral L3-4 transforaminal ESI  Position: Prone Start Time: 9:  End Time: 0:44  Fluoro Time: 52  RN/CMA Royal RMA Royal RMA    Time 9:21 AM 9:48 AM    BP 153/68 152/88    Pulse 51 62    Respirations 16 16    O2 Sat 98 99    S/S 6 6    Pain Level 8/10 0/10     D/C home with Brother , patient A & O X 3, D/C instructions reviewed, and sits independently.         Subjective:    [name], female    DOB: [birth year], 73 y.o.   MRN: [id number]  HPI    Review of Systems     Objective:   Physical Exam        Assessment & Plan:

## 2023-08-06 ENCOUNTER — Encounter: Payer: Self-pay | Admitting: Physical Medicine & Rehabilitation

## 2023-08-06 ENCOUNTER — Encounter: Payer: Medicare HMO | Attending: Physical Medicine and Rehabilitation | Admitting: Physical Medicine & Rehabilitation

## 2023-08-06 VITALS — BP 153/68 | HR 51 | Temp 97.8°F | Ht 66.0 in | Wt 181.0 lb

## 2023-08-06 DIAGNOSIS — M48061 Spinal stenosis, lumbar region without neurogenic claudication: Secondary | ICD-10-CM | POA: Insufficient documentation

## 2023-08-06 MED ORDER — DEXAMETHASONE SODIUM PHOSPHATE 10 MG/ML IJ SOLN
10.0000 mg | Freq: Once | INTRAMUSCULAR | Status: AC
Start: 2023-08-06 — End: 2023-08-06
  Administered 2023-08-06: 10 mg

## 2023-08-06 MED ORDER — LIDOCAINE HCL 1 % IJ SOLN
5.0000 mL | Freq: Once | INTRAMUSCULAR | Status: AC
Start: 2023-08-06 — End: 2023-08-06
  Administered 2023-08-06: 5 mL

## 2023-08-06 MED ORDER — LIDOCAINE HCL (PF) 1 % IJ SOLN
2.0000 mL | Freq: Once | INTRAMUSCULAR | Status: AC
Start: 2023-08-06 — End: 2023-08-06
  Administered 2023-08-06: 2 mL

## 2023-08-06 MED ORDER — IOHEXOL 180 MG/ML  SOLN
3.0000 mL | Freq: Once | INTRAMUSCULAR | Status: AC
  Administered 2023-08-06: 3 mL

## 2023-08-06 NOTE — Progress Notes (Signed)
Bilateral L3-4 Lumbar transforaminal epidural steroid injection under fluoroscopic guidance with contrast enhancement  Indication: Lumbosacral radiculitis is not relieved by medication management or other conservative care and interfering with self-care and mobility.   Informed consent was obtained after describing risk and benefits of the procedure with the patient, this includes bleeding, bruising, infection, paralysis and medication side effects.  The patient wishes to proceed and has given written consent.  Patient was placed in prone position.  The lumbar area was marked and prepped with Betadine.  It was entered with a 25-gauge 1-1/2 inch needle and one mL of 1% lidocaine was injected into the skin and subcutaneous tissue.  Then a 22-gauge 5" spinal needle was inserted into the Left  L3-4  intervertebral foramen under AP, lateral, and oblique view. Retroneural approach utilized .  Once needle tip was within the foramen on lateral views an dnor exceeding 6 o clock position on th epedical on AP viewed Isovue 200 was inected x 2ml . Then a solution containing one mL of 10 mg per mL dexamethasone and 2 mL of 1% lidocaine was injected.  The patient tolerated procedure well. The same procedure was performed on the right side with the same needle injectate but using "safe triangle"  technique.   Post procedure instructions were given.  Please see post procedure form.

## 2023-08-06 NOTE — Patient Instructions (Signed)

## 2023-08-09 ENCOUNTER — Other Ambulatory Visit: Payer: Self-pay | Admitting: Family Medicine

## 2023-08-09 DIAGNOSIS — F339 Major depressive disorder, recurrent, unspecified: Secondary | ICD-10-CM

## 2023-08-12 ENCOUNTER — Telehealth: Payer: Self-pay | Admitting: Family Medicine

## 2023-08-12 DIAGNOSIS — E119 Type 2 diabetes mellitus without complications: Secondary | ICD-10-CM

## 2023-08-12 NOTE — Telephone Encounter (Signed)
 Patient walked in with note saying the wrong lancets were ordered. She needs the SN: [id number]; Verio Flex One Touch, One Touch Delicaplus Blood Glucose Meter

## 2023-08-13 MED ORDER — ONETOUCH VERIO W/DEVICE KIT
PACK | 0 refills | Status: DC
Start: 2023-08-13 — End: 2023-08-17

## 2023-08-13 NOTE — Telephone Encounter (Signed)
 Patient informed. Penni Bombard CMA

## 2023-08-16 ENCOUNTER — Telehealth: Payer: Self-pay

## 2023-08-16 DIAGNOSIS — E119 Type 2 diabetes mellitus without complications: Secondary | ICD-10-CM

## 2023-08-16 NOTE — Telephone Encounter (Signed)
 Pharmacy Patient Advocate Encounter   Received notification from CoverMyMeds that prior authorization for OneTouch Verio Flex System is required/requested.   Insurance verification completed.   The patient is insured through [name] .    ACCU-CHEK AVIVA PLUS, ACCU-CHEK GUIDE, ACCU-CHEK GUIDE METER, OR TRUE METRIX METER, TRUETRACK STRIPS are preferred by the insurance. If suggested medication is appropriate, Please send in a new RX and discontinue this one. If not, please advise as to why it's not appropriate so that we may request a Prior Authorization. Please note, some preferred medications may still require a PA.  If the suggested medications have not been trialed and there are no contraindications to their use, the PA will not be submitted, as it will not be approved.Candice Warner

## 2023-08-17 MED ORDER — ACCU-CHEK SOFTCLIX LANCETS MISC
3 refills | Status: AC
Start: 2023-08-17 — End: ?

## 2023-08-17 MED ORDER — ACCU-CHEK SOFTCLIX LANCET DEV KIT
PACK | 0 refills | Status: AC
Start: 2023-08-17 — End: ?

## 2023-08-17 MED ORDER — ACCU-CHEK GUIDE W/DEVICE KIT
PACK | 0 refills | Status: AC
Start: 2023-08-17 — End: ?

## 2023-08-17 NOTE — Telephone Encounter (Signed)
 Patient informed. Penni Bombard CMA

## 2023-08-27 ENCOUNTER — Other Ambulatory Visit: Payer: Self-pay | Admitting: Family Medicine

## 2023-08-31 DIAGNOSIS — E119 Type 2 diabetes mellitus without complications: Secondary | ICD-10-CM | POA: Diagnosis not present

## 2023-09-03 DIAGNOSIS — E119 Type 2 diabetes mellitus without complications: Secondary | ICD-10-CM | POA: Diagnosis not present

## 2023-09-06 ENCOUNTER — Ambulatory Visit (INDEPENDENT_AMBULATORY_CARE_PROVIDER_SITE_OTHER): Admitting: Family Medicine

## 2023-09-06 ENCOUNTER — Encounter: Payer: Self-pay | Admitting: Family Medicine

## 2023-09-06 VITALS — BP 148/78 | HR 52 | Ht 66.0 in | Wt 184.0 lb

## 2023-09-06 DIAGNOSIS — E119 Type 2 diabetes mellitus without complications: Secondary | ICD-10-CM | POA: Diagnosis not present

## 2023-09-06 DIAGNOSIS — I10 Essential (primary) hypertension: Secondary | ICD-10-CM

## 2023-09-06 DIAGNOSIS — M79671 Pain in right foot: Secondary | ICD-10-CM

## 2023-09-06 LAB — POCT GLYCOSYLATED HEMOGLOBIN (HGB A1C): HbA1c, POC (controlled diabetic range): 6.7 % (ref 0.0–7.0)

## 2023-09-06 MED ORDER — DICLOFENAC SODIUM 1 % EX GEL
4.0000 g | Freq: Four times a day (QID) | CUTANEOUS | 5 refills | Status: DC
Start: 2023-09-06 — End: 2023-12-28

## 2023-09-06 NOTE — Assessment & Plan Note (Signed)
 148/78 upon repeat, taking amlodipine 10 mg daily.  Home BPs normal, advised to continue home monitoring and follow-up as needed.

## 2023-09-06 NOTE — Assessment & Plan Note (Signed)
 A1c 6.7, consistent with prior.  Continue management with lifestyle modification and metformin. -ACR

## 2023-09-06 NOTE — Progress Notes (Signed)
    SUBJECTIVE:   CHIEF COMPLAINT / HPI:   Right foot pain Ongoing times couple months.  Denies inciting injury but has been on her feet more.  Pain all throughout the day, shooting at times.  Primarily in her right foot.  Normally wears tennis shoes to work.  Taking Tylenol for pain relief.  Denies weakness or sensation loss.  Normal gait.  Ongoing right knee OA, wears brace daily.  Recently received epidural steroid injections last week with some improvement in right knee OA back pain.  Diabetes Current Regimen: Metformin 1000mg  BID CBGs: Fasting 120-150s, can vary.  Last A1c:  Lab Results  Component Value Date   HGBA1C 6.7 09/06/2023    Denies polyuria, polydipsia, hypoglycemia. Last Eye Exam: UTD Statin: Atorvastatin 40mg  daily ACE/ARB: Lisinopril 20mg  daily   Hypertension: - Medications: Amlodipine 10mg  daily - Compliance: Yes - Checking BP at home: 130/60s.  - Denies any SOB, CP, vision changes, LE edema, medication SEs, or symptoms of hypotension   PERTINENT  PMH / PSH: HTN, diverticulosis, hypothyroidism, T2DM, MDD, OA  OBJECTIVE:   BP (!) 154/66   Pulse (!) 52   Ht 5\' 6"  (1.676 m)   Wt 184 lb (83.5 kg)   SpO2 100%   BMI 29.70 kg/m    General: Alert, no apparent distress, well groomed HEENT: Normocephalic, atraumatic, moist mucus membranes, neck supple Respiratory: Normal respiratory effort GI: Non-distended Skin: No rashes, no jaundice Psych: Appropriate mood and affect MSK: TTP over lateral right midfoot around base of fifth metatarsal.  Mild surrounding edema at bony prominence.  Full ankle range of motion.  Good distal cap refill and sensation intact.  2+ dorsalis pedis and posterior talus pulses.  ASSESSMENT/PLAN:   Assessment & Plan Type 2 diabetes mellitus without complication, without long-term current use of insulin (HCC) A1c 6.7, consistent with prior.  Continue management with lifestyle modification and metformin. -ACR Right foot pain Most  consistent with peroneal tendinopathy, low concern for fracture or ligamentous instability.  Likely worsened by chronic right knee OA. -Supportive care with peroneal tendinopathy exercises, Voltaren gel and work modification (provided note) HYPERTENSION, BENIGN SYSTEMIC 148/78 upon repeat, taking amlodipine 10 mg daily.  Home BPs normal, advised to continue home monitoring and follow-up as needed.   Dr. Elberta Fortis, DO [facility]

## 2023-09-06 NOTE — Patient Instructions (Signed)
 It was wonderful to see you today! Thank you for choosing [facility].   Please bring ALL of your medications with you to every visit.   Today we talked about:  Please continue to check your blood pressure at home with goal of less than 140 given your age.  If it is consistently above this value please return to care and let me know. Your A1c is 6.7, please continue take your metformin twice per day.  Will repeat your A1c in 6 months. As we discussed your foot pain is likely secondary to inflammation of the tendons on the side of your foot.  It is likely worsened by your ongoing knee and back pain.  Please wear wide supportive shoes and modify her activities at work to sit more and rest.  You can use Voltaren gel over the affected area.  Please do the exercises provided twice per day for the next 3 to 4 weeks as it would likely take that long to improve.  Please follow up in 6 months for A1c and as needed for persistent symptoms  If you haven't already, sign up for My Chart to have easy access to your labs results, and communication with your primary care physician.   We are checking some labs today. If they are abnormal, I will call you. If they are normal, I will send you a MyChart message (if it is active) or a letter in the mail. If you do not hear about your labs in the next 2 weeks, please call the office.  Call the clinic at [phone] if your symptoms worsen or you have any concerns.  Please be sure to schedule follow up at the front desk before you leave today.   Elberta Fortis, DO Family Medicine

## 2023-09-07 ENCOUNTER — Encounter: Payer: Self-pay | Admitting: Family Medicine

## 2023-09-07 LAB — MICROALBUMIN / CREATININE URINE RATIO
Creatinine, Urine: 98.9 mg/dL
Microalb/Creat Ratio: 23 mg/g{creat} (ref 0–29)
Microalbumin, Urine: 22.3 ug/mL

## 2023-09-29 ENCOUNTER — Encounter: Payer: Medicare HMO | Attending: Physical Medicine and Rehabilitation | Admitting: Physical Medicine and Rehabilitation

## 2023-09-29 ENCOUNTER — Encounter: Payer: Self-pay | Admitting: Physical Medicine and Rehabilitation

## 2023-09-29 VITALS — BP 158/75 | HR 56 | Ht 66.0 in | Wt 181.0 lb

## 2023-09-29 DIAGNOSIS — G894 Chronic pain syndrome: Secondary | ICD-10-CM | POA: Insufficient documentation

## 2023-09-29 DIAGNOSIS — M48061 Spinal stenosis, lumbar region without neurogenic claudication: Secondary | ICD-10-CM | POA: Diagnosis not present

## 2023-09-29 DIAGNOSIS — M25561 Pain in right knee: Secondary | ICD-10-CM | POA: Insufficient documentation

## 2023-09-29 DIAGNOSIS — M79671 Pain in right foot: Secondary | ICD-10-CM | POA: Diagnosis not present

## 2023-09-29 MED ORDER — GABAPENTIN 300 MG PO CAPS: 300.0000 mg | ORAL_CAPSULE | Freq: Two times a day (BID) | ORAL | 2 refills | Status: DC

## 2023-09-29 NOTE — Progress Notes (Signed)
 Subjective:    [name], female    DOB: [birth year], 73 y.o.   MRN: [id number]  HPI  Candice Warner is a 73 y.o. year old female  who  has a past medical history of Arthritis, Depression, Hyperlipidemia, Hypertension, Hypokalemia, Hypothyroidism, Lumbar back pain, Obesity, Radiculopathy, Thyroid disease, and Type II diabetes mellitus (HCC).    They are presenting to PM&R clinic for follow up related to b/l low back with R thigh radicular pain and R knee pain with mild OA/chondrocalcinosis.   .  Plan from last visit:  Chronic pain syndrome Facet arthropathy, multilevel Neuroforaminal stenosis of lumbar spine MRI lumbar spine 08/05/22: IMPRESSION: 1. Widespread lumbar spine degeneration. Mild levoconvex lumbar scoliosis and subtle anterolisthesis of L4 on L5. 2. Severe multifactorial spinal, lateral recess, and foraminal stenosis at L3-L4. Query L3 and/or L4 radiculitis. 3. Mild spinal stenosis at both L2-L3 and L4-L5. And moderate to severe neural foraminal stenosis also at the bilateral L2 and left L5 nerve levels. 4. Fibroid uterus. Diverticulosis of the distal large bowel.   I recommend returning to Dr. Wynn Banker for epidural steroid injections, since this seemed to help your pain the most and for the longest duration, and since back pain is more limiting than knee pain. If this does not help at next appointment, we will discuss neurosurgical referral   Continune voltaren gel and tylenol PM   Continue gabapentin at current dose.    Follow up with me in 3 months.    Impaired mobility and ADLs I recommend using your home knee sleeve for additional support and management of swelling. If knee sleeve does not help, message or call me and I will write another script for a hinged brace.   Continue home exercises.    Right knee pain, unspecified chronicity R knee xray 04/13/23:  IMPRESSION: 1. No acute fracture or subluxation of the right knee. 2. Mild  tricompartmental osteoarthritis. 3. Faint chondrocalcinosis.    Discuss simvisc injections in the right knee with Dr. Wynn Banker next visit.   Interval Hx:  - Therapies: No activities in the community; continuing HEP for her low back twice per week. Does back stretches. She walks her schnauzer once a week about 1/2 mile, usually takes her about 7-12 minutes; usually lets her out to run in the yard.    - Follow ups: Had ESI L L3-4 with Dr. Wynn Banker 2/21 - worked well, still getting benefit "it hurts a little but not much". ; Did not discuss simvisc but currently is not too bothered by knee pain.    - Falls: none   - URK:YHCWC right knee brace for stability; it "prevents it from acting up".    - Medications: No changes to meds. Is taking BP at home usually but hasn't in awhile. Has taken her medications today.    - Other concerns: PCP noted peroneal tendinopathy on the riht - she is using voltaren gel and has a work modification note (sit/stand alternating every 15 minutes) for this. "Sometimes it cuts real bad and it hurts to walk". Intermittent pain with aching, stabbing, and occasional numbness in her right pinky toe. Has had for a few months. Voltaren gel does not help, she has not seen sports medicine or orthopedics for it.    Pain Inventory Average Pain 7 Pain Right Now 7 My pain is intermittent and aching  In the last 24 hours, has pain interfered with the following? General activity 5 Relation with others 5 Enjoyment  of life 5 What TIME of day is your pain at its worst? evening Sleep (in general) Fair  Pain is worse with: walking and standing Pain improves with: rest and medication Relief from Meds: 8  Family History  Problem Relation Age of Onset   Heart disease Mother    Heart disease Father    Breast cancer Maternal Aunt    Social History   Socioeconomic History   Marital status: Widowed    Spouse name: Not on file   Number of children: Not on file   Years of  education: Not on file   Highest education level: Not on file  Occupational History   Not on file  Tobacco Use   Smoking status: Never   Smokeless tobacco: Never  Vaping Use   Vaping status: Never Used  Substance and Sexual Activity   Alcohol use: Not Currently   Drug use: Never   Sexual activity: Not Currently  Other Topics Concern   Not on file  Social History Narrative   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  Divorced, 4 children, 1 adopted.  Husband died May 07, 2024 from colon CA.; Son murdered (hanging) Aug 08, 1999, father died 1999/08/26.  Lives w/ 14yo son (adopted).    Social Drivers of Corporate investment banker Strain: Low Risk  (08-08-23)   Overall Financial Resource Strain (CARDIA)    Difficulty of Paying Living Expenses: Not hard at all  Food Insecurity: No Food Insecurity (08-08-23)   Hunger Vital Sign    Worried About Running Out of Food in the Last Year: Never true    Ran Out of Food in the Last Year: Never true  Transportation Needs: No Transportation Needs (2023/08/08)   PRAPARE - Administrator, Civil Service (Medical): No    Lack of Transportation (Non-Medical): No  Physical Activity: Sufficiently Active (08-Aug-2023)   Exercise Vital Sign    Days of Exercise per Week: 5 days    Minutes of Exercise per Session: 30 min  Stress: No Stress Concern Present (08/08/2023)   [employer] of Occupational Health - Occupational Stress Questionnaire    Feeling of Stress : Only a little  Social Connections: Moderately Isolated (08-08-23)   Social Connection and Isolation Panel [NHANES]    Frequency of Communication with Friends and Family: More than three times a week    Frequency of Social Gatherings with Friends and Family: Three times a week    Attends Religious Services: More than 4 times per year    Active Member of Clubs or Organizations: No    Attends Banker Meetings: Never    Marital Status: Widowed   Past Surgical History:   Procedure Laterality Date   CYST REMOVAL HAND Right 11/07/2015   Procedure: EXCISION OF VOLAR RETANICULUM RIGHT HAND;  Surgeon: Ferd Householder, MD;  Location: [facility];  Service: Orthopedics;  Laterality: Right;   SHOULDER ARTHROSCOPY Left 2002   SHOULDER ARTHROSCOPY WITH ROTATOR CUFF REPAIR AND SUBACROMIAL DECOMPRESSION  07/14/2012   Procedure: SHOULDER ARTHROSCOPY WITH ROTATOR CUFF REPAIR AND SUBACROMIAL DECOMPRESSION;  Surgeon: Ferd Householder, MD;  Location: [facility];  Service: Orthopedics;  Laterality: Right;  Right Shoulder Arthroscopy, Distal Claviculectomy, Subacromial Decompression, Partial Acromioplasty with Coracoacromial Release with Arthroscopic Rotator Cuff Repair    THYROID LOBECTOMY Right    Goiter s/p right thyroidectomy   TRIGGER FINGER RELEASE Right 11/07/2015   Procedure: RELEASE TRIGGER FINGER/A-1 PULLEY RIGHT RING FINGER ;  Surgeon: Loreta Ave, MD;  Location: [facility];  Service: Orthopedics;  Laterality: Right;   TUBAL LIGATION     Past Surgical History:  Procedure Laterality Date   CYST REMOVAL HAND Right 11/07/2015   Procedure: EXCISION OF VOLAR RETANICULUM RIGHT HAND;  Surgeon: Loreta Ave, MD;  Location: [facility];  Service: Orthopedics;  Laterality: Right;   SHOULDER ARTHROSCOPY Left 2002   SHOULDER ARTHROSCOPY WITH ROTATOR CUFF REPAIR AND SUBACROMIAL DECOMPRESSION  07/14/2012   Procedure: SHOULDER ARTHROSCOPY WITH ROTATOR CUFF REPAIR AND SUBACROMIAL DECOMPRESSION;  Surgeon: Loreta Ave, MD;  Location: [facility];  Service: Orthopedics;  Laterality: Right;  Right Shoulder Arthroscopy, Distal Claviculectomy, Subacromial Decompression, Partial Acromioplasty with Coracoacromial Release with Arthroscopic Rotator Cuff Repair    THYROID LOBECTOMY Right    Goiter s/p right thyroidectomy   TRIGGER FINGER RELEASE Right 11/07/2015   Procedure: RELEASE TRIGGER FINGER/A-1 PULLEY RIGHT RING  FINGER ;  Surgeon: Loreta Ave, MD;  Location: [facility];  Service: Orthopedics;  Laterality: Right;   TUBAL LIGATION     Past Medical History:  Diagnosis Date   Arthritis    "right hip" (06/27/2018)   Depression    Hyperlipidemia    Hypertension    Hypokalemia    Hypothyroidism    Lumbar back pain    Obesity    Radiculopathy    Thyroid disease    Type II diabetes mellitus (HCC)    Ht 5\' 6"  (1.676 m)   Wt 181 lb (82.1 kg)   BMI 29.21 kg/m   Opioid Risk Score:   Fall Risk Score:  `1  Depression screen [facility] 2/9     09/06/2023   10:01 AM 07/19/2023    1:49 PM 07/05/2023    8:58 AM 05/04/2023    9:22 AM 04/05/2023    9:09 AM 02/23/2023    3:42 PM 12/25/2022   11:38 AM  Depression screen PHQ 2/9  Decreased Interest 1 0 0 0 0 3 0  Down, Depressed, Hopeless 0 1 0 0 0 2 0  PHQ - 2 Score 1 1 0 0 0 5 0  Altered sleeping 3 3    3    Tired, decreased energy 1 3    3    Change in appetite 1 0    3   Feeling bad or failure about yourself  0 0    0   Trouble concentrating 0 0    0   Moving slowly or fidgety/restless 0 0    0   Suicidal thoughts 0 0    0   PHQ-9 Score 6 7    14    Difficult doing work/chores  Not difficult at all    Somewhat difficult     Review of Systems  Musculoskeletal:  Positive for back pain. Negative for gait problem.       Right knee pain  All other systems reviewed and are negative.      Objective:   Physical Exam   Constitution: Appropriate appearance for age. No apparent distress   Resp: No respiratory distress. No accessory muscle usage. on RA Cardio: Well perfused appearance. No peripheral edema. Abdomen: Nondistended. Nontender.   Psych: Appropriate mood and affect. Neuro: AAOx4. No apparent cognitive deficits    Neurologic Exam:   Sensory exam: revealed normal sensation in all dermatomal regions in bilateral lower extremities  Motor exam: strength 5/5 throughout bilateral lower extremities Coordination: Fine motor  coordination was normal.  Gait: normal, small catch with right knee but stable   R knee: TTP medial  joint line,  no palpable effusion. Strength intact. ROM -5 degrees. + hinged knee brace. Gait normal.     Back Exam:   Inspection: Pelvis was  even.  Lumbar lordotic curvature was  reduced .  There was  no evidence of scoliosis.  Palpation: Palpatory exam revealed no ttp in neutral position . There was no evidence of spasm.    ROM revealed restricted ROM in extension of the back . Special/provocative testing:    Slump test: + low back pain   Facet loading: + Bilateral low back, local   TTP at paraspinals: Mild (sensitive for facet pain...if no ttp then likely not facet pain), R>L    R foot: + TTP over right cuboid-5th MTP joint; no deformity compared to the left, no warmth or edema. Mild sensitivity on the lateral foot extending to the lateral malleolus. No sensory loss. Full AROM.      Assessment & Plan:   NERIAH BROTT is a 73 y.o. year old female  who  has a past medical history of Arthritis, Depression, Hyperlipidemia, Hypertension, Hypokalemia, Hypothyroidism, Lumbar back pain, Obesity, Radiculopathy, Thyroid disease, and Type II diabetes mellitus (HCC).   They are presenting to PM&R clinic for follow up related to b/l low back with lumar radiculopathy, and R knee pain with mild OA/chondrocalcinosis, and now R foot pain  Chronic pain syndrome Neuroforaminal stenosis of lumbar spine Continue epidural steroid injections with Dr. Sharl Davies  Continue gabapentin 300 mg  tabs; refilled today  Follow up every 6 months  Right knee pain, unspecified chronicity Voltaren gel to your knee and brace as tolerated; if pain worsens you can make an appointment to try simvisc injecitons  Right foot pain - agree with PCP most likely neuropathy Try salonpas patch or capsaicin cream instead of voltaren for your foot pain. If that does not improved, we can get xrays and possibly a referral to  podiatry.  Other orders -     Gabapentin; Take 1 capsule (300 mg total) by mouth 2 (two) times daily.  Dispense: 60 capsule; Refill: 2

## 2023-09-29 NOTE — Patient Instructions (Signed)
 Voltaren gel to your knee and brace as tolerated; if pain worsens you can make an appointment to try simvisc injecitons  Continue epidural steroid injections with Dr. Sharl Davies  Try salonpas patch or capsaicin cream instead of voltaren for your foot pain. If that does not improved, we can get xrays and possibly a referral to podiatry.  Continue gabapentin 300 mg  tabs; refilled today  Follow up every 6 months

## 2023-09-30 DIAGNOSIS — E119 Type 2 diabetes mellitus without complications: Secondary | ICD-10-CM | POA: Diagnosis not present

## 2023-10-06 DIAGNOSIS — E119 Type 2 diabetes mellitus without complications: Secondary | ICD-10-CM | POA: Diagnosis not present

## 2023-10-07 ENCOUNTER — Other Ambulatory Visit: Payer: Self-pay | Admitting: Family Medicine

## 2023-11-01 DIAGNOSIS — E119 Type 2 diabetes mellitus without complications: Secondary | ICD-10-CM | POA: Diagnosis not present

## 2023-11-05 DIAGNOSIS — E119 Type 2 diabetes mellitus without complications: Secondary | ICD-10-CM | POA: Diagnosis not present

## 2023-11-25 ENCOUNTER — Other Ambulatory Visit: Payer: Self-pay | Admitting: Family Medicine

## 2023-11-28 ENCOUNTER — Other Ambulatory Visit: Payer: Self-pay | Admitting: Family Medicine

## 2023-11-28 DIAGNOSIS — E039 Hypothyroidism, unspecified: Secondary | ICD-10-CM

## 2023-12-05 DIAGNOSIS — E119 Type 2 diabetes mellitus without complications: Secondary | ICD-10-CM | POA: Diagnosis not present

## 2023-12-13 DIAGNOSIS — E119 Type 2 diabetes mellitus without complications: Secondary | ICD-10-CM | POA: Diagnosis not present

## 2023-12-28 ENCOUNTER — Ambulatory Visit (HOSPITAL_COMMUNITY)
Admission: EM | Admit: 2023-12-28 | Discharge: 2023-12-28 | Disposition: A | Discharge status: Home / Self Care | Attending: Physician Assistant | Admitting: Physician Assistant

## 2023-12-28 ENCOUNTER — Encounter (HOSPITAL_COMMUNITY): Payer: Self-pay

## 2023-12-28 DIAGNOSIS — J069 Acute upper respiratory infection, unspecified: Secondary | ICD-10-CM

## 2023-12-28 LAB — POC COVID19/FLU A&B COMBO
Covid Antigen, POC: NEGATIVE
Influenza A Antigen, POC: NEGATIVE
Influenza B Antigen, POC: NEGATIVE

## 2023-12-28 NOTE — ED Triage Notes (Signed)
 Patient here today with c/o productive cough, chest soreness, chest congestion, headache, nasal congestion, sweats, wheeze, and SOB since Saturday. She has been taking Tylenol  with no relief. Her grandchild is in daycare and has a little cold.

## 2023-12-30 NOTE — ED Provider Notes (Signed)
 [facility]    CSN: [id number] Arrival date & time: 12/28/23  0800      History   Chief Complaint Chief Complaint  Patient presents with   Cough    HPI Candice Warner is a 73 y.o. female.   Patient here today for evaluation cough, chest soreness and chest congestion with associated headache and nasal congestion with wheeze that started a few days ago.  She reports that she has been taking Tylenol  without improvement. Her grandchild is in daycare and has had a little cold recently.   The history is provided by the patient.  Cough Associated symptoms: headaches   Associated symptoms: no chills, no ear pain, no eye discharge, no fever, no shortness of breath, no sore throat and no wheezing     Past Medical History:  Diagnosis Date   Arthritis    right hip (06/27/2018)   Depression    Hyperlipidemia    Hypertension    Hypokalemia    Hypothyroidism    Lumbar back pain    Obesity    Radiculopathy    Thyroid  disease    Type II diabetes mellitus (HCC)     Patient Active Problem List   Diagnosis Date Noted   Right foot pain 09/29/2023   Chronic pain syndrome 07/05/2023   Neuroforaminal stenosis of lumbar spine 04/05/2023   Arthritis 04/05/2023   Myofascial low back pain 07/27/2022   Thigh pain 02/25/2022   Calcification of left breast 06/26/2019   Osteoarthritis 03/15/2019   Nodule of chest wall 06/21/2015   Right knee pain 06/21/2015   Left trigger finger 10/23/2014   Right hip pain 06/01/2011   HLD (hyperlipidemia) 04/16/2009   Type 2 diabetes mellitus without complication, without long-term current use of insulin  (HCC) 10/29/2008   Facet arthropathy, multilevel 10/29/2008   Hypothyroidism 08/12/2006   OBESITY, NOS 08/12/2006   Major depressive disorder, recurrent episode (HCC) 08/12/2006   HYPERTENSION, BENIGN SYSTEMIC 08/12/2006   DIVERTICULOSIS OF COLON 08/12/2006    Past Surgical History:  Procedure Laterality Date   CYST REMOVAL HAND  Right 11/07/2015   Procedure: EXCISION OF VOLAR RETANICULUM RIGHT HAND;  Surgeon: Toribio JULIANNA Chancy, MD;  Location: [facility];  Service: Orthopedics;  Laterality: Right;   SHOULDER ARTHROSCOPY Left 2002   SHOULDER ARTHROSCOPY WITH ROTATOR CUFF REPAIR AND SUBACROMIAL DECOMPRESSION  07/14/2012   Procedure: SHOULDER ARTHROSCOPY WITH ROTATOR CUFF REPAIR AND SUBACROMIAL DECOMPRESSION;  Surgeon: Toribio JULIANNA Chancy, MD;  Location: [facility];  Service: Orthopedics;  Laterality: Right;  Right Shoulder Arthroscopy, Distal Claviculectomy, Subacromial Decompression, Partial Acromioplasty with Coracoacromial Release with Arthroscopic Rotator Cuff Repair    THYROID  LOBECTOMY Right    Goiter s/p right thyroidectomy   TRIGGER FINGER RELEASE Right 11/07/2015   Procedure: RELEASE TRIGGER FINGER/A-1 PULLEY RIGHT RING FINGER ;  Surgeon: Toribio JULIANNA Chancy, MD;  Location: [facility];  Service: Orthopedics;  Laterality: Right;   TUBAL LIGATION      OB History   No obstetric history on file.      Home Medications    Prior to Admission medications   Medication Sig Start Date End Date Taking? Authorizing Provider  Accu-Chek Softclix Lancets lancets Use to check blood sugar 3 times daily 08/17/23   Theophilus Pagan, MD  amLODipine  (NORVASC ) 10 MG tablet TAKE 1 TABLET BY MOUTH EVERYDAY AT BEDTIME 02/23/23   Theophilus Pagan, MD  aspirin  EC 81 MG tablet Take 1 tablet by mouth daily.    [provider]  atorvastatin  (LIPITOR) 40 MG tablet TAKE 1 TABLET BY MOUTH EVERY DAY 11/25/23   Theophilus Pagan, MD  Blood Glucose Monitoring Suppl (ACCU-CHEK GUIDE) w/Device KIT Check blood sugar once daily 08/17/23   Theophilus Pagan, MD  gabapentin  (NEURONTIN ) 300 MG capsule Take 1 capsule (300 mg total) by mouth 2 (two) times daily. 10/25/23   Emeline Search C, DO  glucose 4 GM chewable tablet Chew 1 tablet (4 g total) by mouth as needed for low blood sugar. 07/07/21   Autry-Lott, Rojean, DO   Lancets Misc. (ACCU-CHEK SOFTCLIX LANCET DEV) KIT Use to check blood sugar 3 times daily 08/17/23   Theophilus Pagan, MD  levothyroxine  (SYNTHROID ) 50 MCG tablet TAKE 1 TABLET BY MOUTH EVERY DAY 11/29/23   Theophilus Pagan, MD  lisinopril  (ZESTRIL ) 20 MG tablet TAKE 1 TABLET BY MOUTH EVERY DAY 11/29/23   Theophilus Pagan, MD  loratadine  (CLARITIN ) 10 MG tablet Take 1 tablet by mouth daily. 05/14/20   [provider]  metFORMIN  (GLUCOPHAGE -XR) 500 MG 24 hr tablet TAKE 2 TABLETS BY MOUTH 2 TIMES DAILY WITH A MEAL. NEEDS APPOINTMENT PRIOR TO NEXT REFILL 10/08/23   Theophilus Pagan, MD  [facility] VERIO test strip USE AS INSTRUCTED 09/04/22   Theophilus Pagan, MD  sertraline  (ZOLOFT ) 100 MG tablet TAKE 1/2 TABLET BY MOUTH DAILY 08/10/23   Theophilus Pagan, MD    Family History Family History  Problem Relation Age of Onset   Heart disease Mother    Heart disease Father    Breast cancer Maternal Aunt     Social History Social History   Tobacco Use   Smoking status: Never   Smokeless tobacco: Never  Vaping Use   Vaping status: Never Used  Substance Use Topics   Alcohol use: Not Currently   Drug use: Never     Allergies   Patient has no known allergies.   Review of Systems Review of Systems  Constitutional:  Negative for chills and fever.  HENT:  Positive for congestion. Negative for ear pain and sore throat.   Eyes:  Negative for discharge and redness.  Respiratory:  Positive for cough. Negative for shortness of breath and wheezing.   Gastrointestinal:  Negative for abdominal pain, diarrhea, nausea and vomiting.  Neurological:  Positive for headaches.     Physical Exam Triage Vital Signs ED Triage Vitals  Encounter Vitals Group     BP 12/28/23 0822 (!) 169/71     Girls Systolic BP Percentile --      Girls Diastolic BP Percentile --      Boys Systolic BP Percentile --      Boys Diastolic BP Percentile --      Pulse Rate 12/28/23 0822 (!) 53     Resp 12/28/23 0822 16      Temp 12/28/23 0822 98.5 F (36.9 C)     Temp Source 12/28/23 0822 Oral     SpO2 12/28/23 0822 98 %     Weight --      Height --      Head Circumference --      Peak Flow --      Pain Score 12/28/23 0820 8     Pain Loc --      Pain Education --      Exclude from Growth Chart --    No data found.  Updated Vital Signs BP (!) 169/71 (BP Location: Left Arm)   Pulse (!) 53   Temp 98.5 F (36.9 C) (Oral)  Resp 16   SpO2 98%   Visual Acuity Right Eye Distance:   Left Eye Distance:   Bilateral Distance:    Right Eye Near:   Left Eye Near:    Bilateral Near:     Physical Exam Vitals and nursing note reviewed.  Constitutional:      General: She is not in acute distress.    Appearance: Normal appearance. She is not ill-appearing.  HENT:     Head: Normocephalic and atraumatic.     Right Ear: Tympanic membrane normal.     Left Ear: Tympanic membrane normal.     Nose: Congestion present.     Mouth/Throat:     Mouth: Mucous membranes are moist.     Pharynx: No oropharyngeal exudate or posterior oropharyngeal erythema.  Eyes:     Conjunctiva/sclera: Conjunctivae normal.  Cardiovascular:     Rate and Rhythm: Normal rate and regular rhythm.     Heart sounds: Normal heart sounds. No murmur heard. Pulmonary:     Effort: Pulmonary effort is normal. No respiratory distress.     Breath sounds: Normal breath sounds. No wheezing, rhonchi or rales.  Skin:    General: Skin is warm and dry.  Neurological:     Mental Status: She is alert.  Psychiatric:        Mood and Affect: Mood normal.        Thought Content: Thought content normal.      UC Treatments / Results  Labs (all labs ordered are listed, but only abnormal results are displayed) Labs Reviewed  POC COVID19/FLU A&B COMBO    EKG   Radiology No results found.  Procedures Procedures (including critical care time)  Medications Ordered in UC Medications - No data to display  Initial Impression /  Assessment and Plan / UC Course  I have reviewed the triage vital signs and the nursing notes.  Pertinent labs & imaging results that were available during my care of the patient were reviewed by me and considered in my medical decision making (see chart for details).    Covid and flu screening negative. Exam reassuring. Suspect viral etiology of URI and advised symptomatic treatment, increased fluids and rest with follow up if symptoms do not improve or worsen in any way. Patient expressed understanding.   Final Clinical Impressions(s) / UC Diagnoses   Final diagnoses:  Acute upper respiratory infection   Discharge Instructions   None    ED Prescriptions   None    PDMP not reviewed this encounter.   Billy Asberry FALCON, PA-C 12/30/23 1408

## 2024-01-04 DIAGNOSIS — E119 Type 2 diabetes mellitus without complications: Secondary | ICD-10-CM | POA: Diagnosis not present

## 2024-01-09 ENCOUNTER — Other Ambulatory Visit: Payer: Self-pay | Admitting: Family Medicine

## 2024-01-09 DIAGNOSIS — M545 Low back pain, unspecified: Secondary | ICD-10-CM

## 2024-01-13 DIAGNOSIS — E119 Type 2 diabetes mellitus without complications: Secondary | ICD-10-CM | POA: Diagnosis not present

## 2024-01-20 ENCOUNTER — Other Ambulatory Visit: Payer: Self-pay | Admitting: Family Medicine

## 2024-01-20 DIAGNOSIS — H16223 Keratoconjunctivitis sicca, not specified as Sjogren's, bilateral: Secondary | ICD-10-CM | POA: Diagnosis not present

## 2024-01-20 DIAGNOSIS — H25813 Combined forms of age-related cataract, bilateral: Secondary | ICD-10-CM | POA: Diagnosis not present

## 2024-01-20 DIAGNOSIS — E119 Type 2 diabetes mellitus without complications: Secondary | ICD-10-CM | POA: Diagnosis not present

## 2024-01-20 LAB — HM DIABETES EYE EXAM

## 2024-01-22 ENCOUNTER — Other Ambulatory Visit: Payer: Self-pay | Admitting: Physical Medicine and Rehabilitation

## 2024-01-26 DIAGNOSIS — E119 Type 2 diabetes mellitus without complications: Secondary | ICD-10-CM | POA: Diagnosis not present

## 2024-01-26 DIAGNOSIS — I10 Essential (primary) hypertension: Secondary | ICD-10-CM | POA: Diagnosis not present

## 2024-01-27 LAB — MICROALBUMIN / CREATININE URINE RATIO
Albumin, Urine POC: 5.5
Albumin/Creatinine Ratio, Urine, POC: 80
Creatinine, POC: 69 mg/dL

## 2024-01-27 LAB — OPHTHALMOLOGY REPORT-SCANNED

## 2024-02-03 DIAGNOSIS — E119 Type 2 diabetes mellitus without complications: Secondary | ICD-10-CM | POA: Diagnosis not present

## 2024-02-13 DIAGNOSIS — E119 Type 2 diabetes mellitus without complications: Secondary | ICD-10-CM | POA: Diagnosis not present

## 2024-02-18 DIAGNOSIS — E119 Type 2 diabetes mellitus without complications: Secondary | ICD-10-CM | POA: Diagnosis not present

## 2024-03-03 NOTE — Progress Notes (Signed)
    SUBJECTIVE:   CHIEF COMPLAINT / HPI:   Diabetes Current Regimen: Metformin  1000 mg twice daily CBGs: Fasting - 166 this AM, last night it was 88. Last A1c:  Lab Results  Component Value Date   HGBA1C 6.7 03/08/2024    Denies polyuria, polydipsia, hypoglycemia.  Last Eye Exam: UTD - normal. Statin: Atorvastatin  40 mg daily ACE/ARB: Lisinopril  20 mg daily  Hypertension: - Medications: Amlodipine  10 mg daily, lisinopril  20 mg daily - Compliance: Yes - Checking BP at home: Mostly in 130s, does not go into 150-160s.  - Denies any SOB, CP, vision changes, LE edema, medication SEs, or symptoms of hypotension  L hip  Intermittent symptoms. Thinks it is due to her back pain.  Seeing PM&R and receiving ESI, has not received in the past few months and thinks she is overdue.  Scheduled to see them again next month.  Known OA, prior imaging in 05/2022 showed worse in the left hip than right.  Left hand pain Reports her fourth digit was locking up and now her third digit is doing the same.  Told her PM&R doctor but did not receive an injection or treatment.  Reports she saw orthopedic at [facility] previously who did some procedure on her hand.  Reports she would like to go back to see him.  PERTINENT  PMH / PSH: T2DM, hypothyroidism, MDD  OBJECTIVE:   BP (!) 134/56   Pulse (!) 57   Ht 5' 6 (1.676 m)   Wt 181 lb 9.6 oz (82.4 kg)   SpO2 99%   BMI 29.31 kg/m    General: NAD, pleasant, able to participate in exam Feet: 2+ dorsalis pedis pulses bilaterally.  No ulceration or skin breakdown noted.  Motor and sensation intact. MSK: Left hand - No erythema or deformity -TTP over proximal 3rd and 4th digits extending to distal metatarsal head. -Notable locking with flexion of 3rd and 4th digit Extremities: no edema or cyanosis. Skin: warm and dry, no rashes noted Neuro: alert, no obvious focal deficits Psych: Normal affect and mood  ASSESSMENT/PLAN:   Assessment & Plan Type 2  diabetes mellitus without complication, without long-term current use of insulin  (HCC) A1c 6.7, at goal and stable on current regimen.  Foot exam performed today, normal. -Follow-up A1c in 3 to 6 months -BMP, lipid panel HYPERTENSION, BENIGN SYSTEMIC At goal for age, continue current regimen. Hypothyroidism, unspecified type On levothyroxine  50 mcg daily, annual TSH. Left hand pain Trigger finger of 3rd and 4th digits of left hand upon exam, patient prefers to go back to orthopedic doctor who previously provided treatment. -Referral to orthopedics, Dr. Sissy at [facility] Midline low back pain, unspecified chronicity, unspecified whether sciatica present Chronic, persistent symptoms managed by PM&R.  Overdue for ESI, recommended follow-up with them for further management.   *Received COVID and flu vaccines   Dr. Izetta Nap, DO [facility]

## 2024-03-08 ENCOUNTER — Encounter: Payer: Self-pay | Admitting: Family Medicine

## 2024-03-08 ENCOUNTER — Ambulatory Visit (INDEPENDENT_AMBULATORY_CARE_PROVIDER_SITE_OTHER): Admitting: Family Medicine

## 2024-03-08 VITALS — BP 134/56 | HR 57 | Ht 66.0 in | Wt 181.6 lb

## 2024-03-08 DIAGNOSIS — Z23 Encounter for immunization: Secondary | ICD-10-CM | POA: Diagnosis not present

## 2024-03-08 DIAGNOSIS — M545 Low back pain, unspecified: Secondary | ICD-10-CM

## 2024-03-08 DIAGNOSIS — E039 Hypothyroidism, unspecified: Secondary | ICD-10-CM

## 2024-03-08 DIAGNOSIS — E119 Type 2 diabetes mellitus without complications: Secondary | ICD-10-CM | POA: Diagnosis not present

## 2024-03-08 DIAGNOSIS — M79642 Pain in left hand: Secondary | ICD-10-CM

## 2024-03-08 DIAGNOSIS — I10 Essential (primary) hypertension: Secondary | ICD-10-CM | POA: Diagnosis not present

## 2024-03-08 LAB — POCT GLYCOSYLATED HEMOGLOBIN (HGB A1C): HbA1c, POC (controlled diabetic range): 6.7 % (ref 0.0–7.0)

## 2024-03-08 NOTE — Patient Instructions (Addendum)
 It was wonderful to see you today! Thank you for choosing [facility].   Please bring ALL of your medications with you to every visit.   Today we talked about:  Your A1c is 6.7, keep up the good work with your diabetes control!  I think you are doing well on your current regimen and we can continue as is.  Will repeat your A1c in 3 to 6 months. Your blood pressure is at goal for your age today.  Please continue take your medications as prescribed and check at home periodically.  If you are consistently above 150/160 then come back and see me to discuss further. I think a lot of your left hip pain is stemming from your back pain unfortunately.  I do think seeing the pain management doctors again for injections would be the best course of action.  If you still have hip pain after you receive the injections we can discuss if further evaluation of your hip is needed. For your left hand pain I did send a referral back to the orthopedic doctor that you have seen previously.  I do think you are having trigger fingers of at least 2 of your fingers that they could possibly treat.  Please see the information below for calling to schedule.  Dr. Sissy - Orthopedics: [phone] (Work) [address] [place], [place] [address] Orthopedic Surgery  Please follow up in 3 months   If you haven't already, sign up for My Chart to have easy access to your labs results, and communication with your primary care physician.   We are checking some labs today. If they are abnormal, I will call you. If they are normal, I will send you a MyChart message (if it is active) or a letter in the mail. If you do not hear about your labs in the next 2 weeks, please call the office.  Call the clinic at [phone] if your symptoms worsen or you have any concerns.  Please be sure to schedule follow up at the front desk before you leave today.   Izetta Nap, DO Family Medicine

## 2024-03-08 NOTE — Assessment & Plan Note (Signed)
 A1c 6.7, at goal and stable on current regimen.  Foot exam performed today, normal. -Follow-up A1c in 3 to 6 months -BMP, lipid panel

## 2024-03-08 NOTE — Assessment & Plan Note (Signed)
At goal for age, continue current regimen.

## 2024-03-08 NOTE — Assessment & Plan Note (Signed)
 On levothyroxine  50 mcg daily, annual TSH.

## 2024-03-09 LAB — BASIC METABOLIC PANEL WITH GFR
BUN/Creatinine Ratio: 16 (ref 12–28)
BUN: 16 mg/dL (ref 8–27)
CO2: 21 mmol/L (ref 20–29)
Calcium: 9.2 mg/dL (ref 8.7–10.3)
Chloride: 105 mmol/L (ref 96–106)
Creatinine, Ser: 1.01 mg/dL — ABNORMAL HIGH (ref 0.57–1.00)
Glucose: 130 mg/dL — ABNORMAL HIGH (ref 70–99)
Potassium: 3.9 mmol/L (ref 3.5–5.2)
Sodium: 143 mmol/L (ref 134–144)
eGFR: 58 mL/min/1.73 — ABNORMAL LOW (ref >59)

## 2024-03-09 LAB — LIPID PANEL
Chol/HDL Ratio: 1.8 ratio (ref 0.0–4.4)
Cholesterol, Total: 113 mg/dL (ref 100–199)
HDL: 64 mg/dL (ref >39)
LDL Chol Calc (NIH): 32 mg/dL (ref 0–99)
Triglycerides: 85 mg/dL (ref 0–149)
VLDL Cholesterol Cal: 17 mg/dL (ref 5–40)

## 2024-03-09 LAB — TSH: TSH: 1.6 u[IU]/mL (ref 0.450–4.500)

## 2024-03-10 ENCOUNTER — Ambulatory Visit: Payer: Self-pay | Admitting: Family Medicine

## 2024-03-14 DIAGNOSIS — E119 Type 2 diabetes mellitus without complications: Secondary | ICD-10-CM | POA: Diagnosis not present

## 2024-03-27 ENCOUNTER — Ambulatory Visit (INDEPENDENT_AMBULATORY_CARE_PROVIDER_SITE_OTHER): Admitting: Family Medicine

## 2024-03-27 ENCOUNTER — Other Ambulatory Visit (HOSPITAL_COMMUNITY)
Admission: RE | Admit: 2024-03-27 | Discharge: 2024-03-27 | Disposition: A | Discharge status: Home / Self Care | Source: Ambulatory Visit | Attending: Family Medicine | Admitting: Family Medicine

## 2024-03-27 ENCOUNTER — Ambulatory Visit: Payer: Self-pay | Admitting: Family Medicine

## 2024-03-27 ENCOUNTER — Encounter: Payer: Self-pay | Admitting: Family Medicine

## 2024-03-27 VITALS — BP 134/64 | HR 68 | Wt 180.0 lb

## 2024-03-27 DIAGNOSIS — N950 Postmenopausal bleeding: Secondary | ICD-10-CM

## 2024-03-27 DIAGNOSIS — N898 Other specified noninflammatory disorders of vagina: Secondary | ICD-10-CM | POA: Insufficient documentation

## 2024-03-27 DIAGNOSIS — A599 Trichomoniasis, unspecified: Secondary | ICD-10-CM

## 2024-03-27 LAB — POCT WET PREP (WET MOUNT)
Clue Cells Wet Prep Whiff POC: NEGATIVE
WBC, Wet Prep HPF POC: >20

## 2024-03-27 MED ORDER — METRONIDAZOLE 500 MG PO TABS: 500.0000 mg | ORAL_TABLET | Freq: Two times a day (BID) | ORAL | 0 refills | Status: AC

## 2024-03-27 NOTE — Telephone Encounter (Signed)
 Wet prep positive for trichomonas.  Patient has not been sexually active in 2 years.  Discussed she could have received the infection at any point in the year since her prior testing and it is difficult to tell when transmission occurred.  Agreeable to treatment, will send in metronidazole 500 mg twice daily x 7 days.  Recommend test of cure in 4 to 6 weeks.  Izetta Nap, DO

## 2024-03-27 NOTE — Patient Instructions (Addendum)
 It was wonderful to see you today! Thank you for choosing [facility].   Please bring ALL of your medications with you to every visit.   Today we talked about:  For your postmenopausal bleeding I did order transvaginal ultrasound to further look at the endometrium lining.  I am also referring you to GYN to have an endometrial biopsy done as they may need to do a cervical dilation since we were unsuccessful today.  I also swabbed you for infections and we will follow-up with those results if any treatment is indicated.  Please continue to use your panty liners as needed.  Please follow up with GYN for endometrial biopsy and can follow-up with me as needed in 2 to 3 weeks if not seen  If you haven't already, sign up for My Chart to have easy access to your labs results, and communication with your primary care physician.   We are checking some labs today. If they are abnormal, I will call you. If they are normal, I will send you a MyChart message (if it is active) or a letter in the mail. If you do not hear about your labs in the next 2 weeks, please call the office.  Call the clinic at [phone] if your symptoms worsen or you have any concerns.  Please be sure to schedule follow up at the front desk before you leave today.   Izetta Nap, DO Family Medicine

## 2024-03-27 NOTE — Progress Notes (Signed)
    SUBJECTIVE:   CHIEF COMPLAINT / HPI:   Brown discharge Patient reports persistent brown discharge for the past 3 weeks.  States that is happening daily and she is having a change panty liner approximately 3 times per day to accommodate.  Reports she tried Monistat and she is now having some vaginal irritation as well.  Postmenopausal, has not had her cycle in many years.  PERTINENT  PMH / PSH: HTN, T2DM, hypothyroidism, HLD, MDD  OBJECTIVE:   BP 134/64   Pulse 68   Wt 180 lb (81.6 kg)   SpO2 98%   BMI 29.05 kg/m    General: NAD, pleasant, able to participate in exam Pelvic exam: VAGINA: normal appearing vagina with normal color and discharge, no lesions, vaginal discharge - white, creamy, CERVIX: Thin, small flat os, exam chaperoned by Gwynneth Keeling, MD  ASSESSMENT/PLAN:   Assessment & Plan Postmenopausal bleeding History concerning for atypical bleeding, unfortunately unable to perform endometrial biopsy as below due to significant cervical thinning with small cervical os.  Will refer to GYN for completion.  Additionally obtain TVUS to elucidate endometrium further. -Referral to GYN -TVUS Vaginal discharge Endorses some vaginal irritation, obtained swabs. -Follow-up wet prep and G/C swab    Endometrial Biopsy (EMB) Procedure Note PRE-OP DIAGNOSIS: Postmenopausal bleeding POST-OP DIAGNOSIS: Same  PROCEDURE: endometrial biopsy Performing Physician: Izetta Nap, DO Supervising Physician (if applicable): Rollene Keeling, MD   PROCEDURE:  A timeout protocol was performed prior to initiating the procedure Vaginal speculum was inserted, and the cervix was visualized.  The cervix was cleansed with antiseptic solution.  The tenaculum was placed, soon as placement was had a moderate amount of bright red blood started oozing from the cervical os.  With tenaculum placement the cervical os folded and became difficult to visualize.  Reposition to posterior tenaculum placement.   Attempted to pass curette but unable to pass the os without significant bending of the device despite multiple replacement.  Procedure aborted given inability to pass sampling device through the os, may need cervical dilation to complete procedure.   Followup: The patient tolerated the procedure well without complications.  Standard post-procedure care is explained and return precautions are given.    Dr. Izetta Nap, DO [facility]

## 2024-03-28 ENCOUNTER — Ambulatory Visit
Admission: RE | Admit: 2024-03-28 | Discharge: 2024-03-28 | Disposition: A | Source: Ambulatory Visit | Attending: Family Medicine

## 2024-03-28 DIAGNOSIS — N950 Postmenopausal bleeding: Secondary | ICD-10-CM

## 2024-03-28 DIAGNOSIS — D259 Leiomyoma of uterus, unspecified: Secondary | ICD-10-CM | POA: Diagnosis not present

## 2024-03-29 ENCOUNTER — Encounter: Attending: Physical Medicine and Rehabilitation | Admitting: Physical Medicine and Rehabilitation

## 2024-03-29 ENCOUNTER — Encounter: Payer: Self-pay | Admitting: Family Medicine

## 2024-03-29 ENCOUNTER — Encounter: Payer: Self-pay | Admitting: Physical Medicine and Rehabilitation

## 2024-03-29 VITALS — BP 151/67 | HR 60 | Ht 66.0 in | Wt 183.0 lb

## 2024-03-29 DIAGNOSIS — M48061 Spinal stenosis, lumbar region without neurogenic claudication: Secondary | ICD-10-CM | POA: Diagnosis not present

## 2024-03-29 DIAGNOSIS — Z789 Other specified health status: Secondary | ICD-10-CM | POA: Diagnosis not present

## 2024-03-29 DIAGNOSIS — N189 Chronic kidney disease, unspecified: Secondary | ICD-10-CM | POA: Diagnosis not present

## 2024-03-29 DIAGNOSIS — Z794 Long term (current) use of insulin: Secondary | ICD-10-CM

## 2024-03-29 DIAGNOSIS — Z7409 Other reduced mobility: Secondary | ICD-10-CM | POA: Diagnosis not present

## 2024-03-29 DIAGNOSIS — E119 Type 2 diabetes mellitus without complications: Secondary | ICD-10-CM | POA: Diagnosis not present

## 2024-03-29 DIAGNOSIS — G894 Chronic pain syndrome: Secondary | ICD-10-CM | POA: Insufficient documentation

## 2024-03-29 LAB — CERVICOVAGINAL ANCILLARY ONLY
Chlamydia: NEGATIVE
Comment: NEGATIVE
Comment: NORMAL
Neisseria Gonorrhea: NEGATIVE

## 2024-03-29 MED ORDER — MAGNESIUM 250 MG PO CAPS: 1.0000 | ORAL_CAPSULE | Freq: Every evening | ORAL | Status: AC

## 2024-03-29 MED ORDER — GABAPENTIN 300 MG PO CAPS: ORAL_CAPSULE | ORAL | Status: DC

## 2024-03-29 NOTE — Patient Instructions (Signed)
  VISIT SUMMARY: Today, you were seen for follow-up regarding your chronic low back pain with new left-sided lumbar radiculopathy, right knee pain, and right ankle pain. We discussed your current treatment and made some adjustments to help manage your symptoms better.  YOUR PLAN: CHRONIC LOW BACK PAIN WITH LEFT-SIDED LUMBAR RADICULOPATHY: You have chronic low back pain with new pain radiating to the left side of your lower back. Your previous steroid injections provided significant relief, but your current medication, gabapentin , is not effective. -You will be referred to Dr. Clorinda for another steroid injection. -Increase your gabapentin  dose to 300 mg in the morning and 600 mg at night. -Monitor for side effects of gabapentin , such as drowsiness. Report any excessive drowsiness or morning grogginess. -Continue using a heating pad for temporary relief.  LOWER EXTREMITY MUSCLE CRAMPS: You are experiencing muscle cramps in your toes and foot at night. -Start taking a low dose magnesium supplement at night.  CHRONIC KIDNEY DISEASE, UNSPECIFIED STAGE: You have chronic kidney disease with stable kidney function. Your creatinine levels are stable at 1.01. -The increase in your gabapentin  dose is considered safe for your kidney function.                      Contains text generated by Abridge.                                 Contains text generated by Abridge.

## 2024-03-29 NOTE — Progress Notes (Signed)
 Subjective:    [name], female    DOB: [birth year], 73 y.o.   MRN: [id number]  HPI   Candice Warner is a 73 y.o. year old female  who  has a past medical history of Arthritis, Depression, Hyperlipidemia, Hypertension, Hypokalemia, Hypothyroidism, Lumbar back pain, Obesity, Radiculopathy, Thyroid  disease, and Type II diabetes mellitus (HCC).    They are presenting to PM&R clinic for follow up related to b/l low back with L thigh radicular pain and L knee pain with mild OA/chondrocalcinosis.   . Plan from last visit:  Chronic pain syndrome Neuroforaminal stenosis of lumbar spine Continue epidural steroid injections with Dr. Carilyn   Continue gabapentin  300 mg  tabs; refilled today   Follow up every 6 months   Right knee pain, unspecified chronicity Voltaren  gel to your knee and brace as tolerated; if pain worsens you can make an appointment to try simvisc injecitons   Right foot pain - agree with PCP most likely neuropathy Try salonpas patch or capsaicin cream instead of voltaren  for your foot pain. If that does not improved, we can get xrays and possibly a referral to podiatry.    Interval Hx:  Discussed the use of AI scribe software for clinical note transcription with the patient, who gave verbal consent to proceed.  History of Present Illness   Candice Warner is a 73 year old female who presents for follow-up of bilateral low back pain with right radicular thigh pain, right knee pain, and right ankle pain.  She experiences chronic bilateral low back pain with right radicular thigh pain, as well as right knee and ankle pain. The left side of her back is now causing significant discomfort, particularly in the lower part. She last received bilateral L3, 4 lumbotransforminal epidural steroid injections in February, which provided an 80% reduction in pain for approximately three to four months. It has been over three months since the last injection.  She is  currently taking gabapentin  300 mg twice a day without side effects, but it does not alleviate her pain. She experiences pain from the left hip down to the front of her shin, which she manages with a heating pad, though it provides only temporary relief.  No increasing weakness in her left leg, stumbles, or falls are noted. Pain is more pronounced at night and when walking. She experiences leg spasms at night, particularly in her toes and foot, which require her to get out of bed to relieve the cramps. She does not take any supplements like magnesium.          Pain Inventory Average Pain 9 Pain Right Now 9 My pain is aching  In the last 24 hours, has pain interfered with the following? General activity 8 Relation with others 6 Enjoyment of life 6 What TIME of day is your pain at its worst? daytime and night Sleep (in general) Poor  Pain is worse with: walking, sitting, and standing Pain improves with: heat/ice Relief from Meds: 4  Family History  Problem Relation Age of Onset   Heart disease Mother    Heart disease Father    Breast cancer Maternal Aunt    Social History   Socioeconomic History   Marital status: Widowed    Spouse name: Not on file   Number of children: Not on file   Years of education: Not on file   Highest education level: Not on file  Occupational History   Not on file  Tobacco Use   Smoking status: Never    Passive exposure: Never   Smokeless tobacco: Never  Vaping Use   Vaping status: Never Used  Substance and Sexual Activity   Alcohol use: Not Currently   Drug use: Never   Sexual activity: Not Currently  Other Topics Concern   Not on file  Social History Narrative   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  Divorced, 4 children, 1 adopted.  Husband died 2024-05-07 from colon CA.; Son murdered (hanging) 1999-08-08, father died August 30, 1999.  Lives w/ 14yo son (adopted).    Social Drivers of Corporate investment banker Strain: Low Risk  (08-Aug-2023)    Overall Financial Resource Strain (CARDIA)    Difficulty of Paying Living Expenses: Not hard at all  Food Insecurity: No Food Insecurity (2023-08-08)   Hunger Vital Sign    Worried About Running Out of Food in the Last Year: Never true    Ran Out of Food in the Last Year: Never true  Transportation Needs: No Transportation Needs (Aug 08, 2023)   PRAPARE - Administrator, Civil Service (Medical): No    Lack of Transportation (Non-Medical): No  Physical Activity: Sufficiently Active (2023-08-08)   Exercise Vital Sign    Days of Exercise per Week: 5 days    Minutes of Exercise per Session: 30 min  Stress: No Stress Concern Present (2023-08-08)   [employer] of Occupational Health - Occupational Stress Questionnaire    Feeling of Stress : Only a little  Social Connections: Moderately Isolated (Aug 08, 2023)   Social Connection and Isolation Panel    Frequency of Communication with Friends and Family: More than three times a week    Frequency of Social Gatherings with Friends and Family: Three times a week    Attends Religious Services: More than 4 times per year    Active Member of Clubs or Organizations: No    Attends Banker Meetings: Never    Marital Status: Widowed   Past Surgical History:  Procedure Laterality Date   CYST REMOVAL HAND Right 11/07/2015   Procedure: EXCISION OF VOLAR RETANICULUM RIGHT HAND;  Surgeon: Toribio JULIANNA Chancy, MD;  Location:  [facility];  Service: Orthopedics;  Laterality: Right;   SHOULDER ARTHROSCOPY Left 2002   SHOULDER ARTHROSCOPY WITH ROTATOR CUFF REPAIR AND SUBACROMIAL DECOMPRESSION  07/14/2012   Procedure: SHOULDER ARTHROSCOPY WITH ROTATOR CUFF REPAIR AND SUBACROMIAL DECOMPRESSION;  Surgeon: Toribio JULIANNA Chancy, MD;  Location: [facility];  Service: Orthopedics;  Laterality: Right;  Right Shoulder Arthroscopy, Distal Claviculectomy, Subacromial Decompression, Partial Acromioplasty with Coracoacromial Release with  Arthroscopic Rotator Cuff Repair    THYROID  LOBECTOMY Right    Goiter s/p right thyroidectomy   TRIGGER FINGER RELEASE Right 11/07/2015   Procedure: RELEASE TRIGGER FINGER/A-1 PULLEY RIGHT RING FINGER ;  Surgeon: Toribio JULIANNA Chancy, MD;  Location: [facility];  Service: Orthopedics;  Laterality: Right;   TUBAL LIGATION     Past Surgical History:  Procedure Laterality Date   CYST REMOVAL HAND Right 11/07/2015   Procedure: EXCISION OF VOLAR RETANICULUM RIGHT HAND;  Surgeon: Toribio JULIANNA Chancy, MD;  Location: [facility];  Service: Orthopedics;  Laterality: Right;   SHOULDER ARTHROSCOPY Left 2002   SHOULDER ARTHROSCOPY WITH ROTATOR CUFF REPAIR AND SUBACROMIAL DECOMPRESSION  07/14/2012   Procedure: SHOULDER ARTHROSCOPY WITH ROTATOR CUFF REPAIR AND SUBACROMIAL DECOMPRESSION;  Surgeon: Toribio JULIANNA Chancy, MD;  Location: [facility];  Service: Orthopedics;  Laterality: Right;  Right Shoulder Arthroscopy, Distal Claviculectomy, Subacromial Decompression, Partial Acromioplasty with Coracoacromial Release with Arthroscopic Rotator Cuff Repair    THYROID  LOBECTOMY Right    Goiter s/p right thyroidectomy   TRIGGER FINGER RELEASE Right 11/07/2015   Procedure: RELEASE TRIGGER FINGER/A-1 PULLEY RIGHT RING FINGER ;  Surgeon: Toribio JULIANNA Chancy, MD;  Location: [facility];  Service: Orthopedics;  Laterality: Right;   TUBAL LIGATION     Past Medical History:  Diagnosis Date   Arthritis    right hip (06/27/2018)   Depression    Hyperlipidemia    Hypertension    Hypokalemia    Hypothyroidism    Lumbar back pain    Obesity    Radiculopathy    Thyroid  disease    Type II diabetes mellitus (HCC)    BP (!) 151/67   Pulse 60   Ht 5' 6 (1.676 m)   Wt 183 lb (83 kg)   SpO2 97%   BMI 29.54 kg/m   Opioid Risk Score:   Fall Risk Score:  `1  Depression screen [facility] 2/9     03/29/2024    9:39 AM 03/27/2024    3:21 PM 09/29/2023    9:18 AM 09/06/2023   10:01  AM 07/19/2023    1:49 PM 07/05/2023    8:58 AM 05/04/2023    9:22 AM  Depression screen PHQ 2/9  Decreased Interest 0 1 0 1 0 0 0  Down, Depressed, Hopeless 0 0 0 0 1 0 0  PHQ - 2 Score 0 1 0 1 1 0 0  Altered sleeping  3  3 3     Tired, decreased energy  1  1 3     Change in appetite  0  1 0    Feeling bad or failure about yourself   0  0 0    Trouble concentrating  0  0 0    Moving slowly or fidgety/restless  0  0 0    Suicidal thoughts  0  0 0    PHQ-9 Score  5  6 7     Difficult doing work/chores     Not difficult at all       Review of Systems  Musculoskeletal:  Positive for back pain.  All other systems reviewed and are negative.      Objective:   Physical Exam    Constitution: Appropriate appearance for age. No apparent distress   Resp: No respiratory distress. No accessory muscle usage. on RA Cardio: Well perfused appearance. No peripheral edema. Abdomen: Nondistended. Nontender.   Psych: Appropriate mood and affect. Neuro: AAOx4. No apparent cognitive deficits    Neurologic Exam:   Sensory exam: revealed normal sensation in all dermatomal regions in bilateral lower extremities  Motor exam: strength 5/5 throughout bilateral lower extremities Coordination: Fine motor coordination was normal.   Gait: stiff, limping on L leg; forward bent; improves with progressive ambulation   L knee: NO TTP , no palpable effusion. Strength intact.    Back Exam:   Inspection: Pelvis was  even.  Lumbar lordotic curvature was  reduced .  There was  no evidence of scoliosis. . There was no evidence of spasm.    ROM revealed restricted ROM in extension of the back . Special/provocative testing:    Slump test: + low back pain   Facet loading: + Bilateral low back - radiating into left leg   TTP at paraspinals: moderate(sensitive for facet pain...if no ttp then likely not  facet pain), R>L    L foot: No ttp, no edema. Stable         Assessment & Plan:      . Candice Warner is a  73 y.o. year old female  who  has a past medical history of Arthritis, Depression, Hyperlipidemia, Hypertension, Hypokalemia, Hypothyroidism, Lumbar back pain, Obesity, Radiculopathy, Thyroid  disease, and Type II diabetes mellitus (HCC).    They are presenting to PM&R clinic for follow up related to b/l low back with L thigh radicular pain and L knee pain with mild OA/chondrocalcinosis.   Assessment and Plan    Chronic low back pain with left-sided lumbar radiculopathy Chronic low back pain with new left-sided lumbar radiculopathy. Previous epidural steroid injections provided significant relief. Current gabapentin  regimen perceived as ineffective. Pain worsens at night and with walking. - Refer to Dr. Clorinda for another steroid injection. - Increase gabapentin  to 300 mg in the morning and 600 mg at night. - Monitor for gabapentin  side effects such as drowsiness. - Advise reporting excessive drowsiness or morning grogginess. - Continue using a heating pad for relief.  Lower extremity muscle cramps Reports nocturnal muscle cramps affecting toes and foot. Gabapentin  dose increase may alleviate symptoms. - Recommend low dose magnesium supplement at night.  Chronic kidney disease, unspecified stage Chronic kidney disease with stable kidney function. Creatinine levels stable, current 1.01. Gabapentin  dose increase deemed safe.

## 2024-03-30 NOTE — Telephone Encounter (Signed)
 Spoke with patient regarding pelvic ultrasound results that did show thickened endometrium greater than expected for postmenopausal female.  Previously referred to GYN during last office visit, will follow-up on referral information and get patient in for endometrial biopsy and follow-up.  Otherwise reports she is doing well.  Izetta Nap, DO

## 2024-04-05 ENCOUNTER — Other Ambulatory Visit: Payer: Self-pay | Admitting: Family Medicine

## 2024-04-07 DIAGNOSIS — M65332 Trigger finger, left middle finger: Secondary | ICD-10-CM | POA: Diagnosis not present

## 2024-04-07 DIAGNOSIS — M79642 Pain in left hand: Secondary | ICD-10-CM | POA: Diagnosis not present

## 2024-04-07 DIAGNOSIS — E119 Type 2 diabetes mellitus without complications: Secondary | ICD-10-CM | POA: Diagnosis not present

## 2024-04-13 DIAGNOSIS — I129 Hypertensive chronic kidney disease with stage 1 through stage 4 chronic kidney disease, or unspecified chronic kidney disease: Secondary | ICD-10-CM | POA: Diagnosis not present

## 2024-04-13 DIAGNOSIS — N1831 Chronic kidney disease, stage 3a: Secondary | ICD-10-CM | POA: Diagnosis not present

## 2024-04-13 DIAGNOSIS — F419 Anxiety disorder, unspecified: Secondary | ICD-10-CM | POA: Diagnosis not present

## 2024-04-13 DIAGNOSIS — M1990 Unspecified osteoarthritis, unspecified site: Secondary | ICD-10-CM | POA: Diagnosis not present

## 2024-04-13 DIAGNOSIS — E1122 Type 2 diabetes mellitus with diabetic chronic kidney disease: Secondary | ICD-10-CM | POA: Diagnosis not present

## 2024-04-13 DIAGNOSIS — Z8249 Family history of ischemic heart disease and other diseases of the circulatory system: Secondary | ICD-10-CM | POA: Diagnosis not present

## 2024-04-13 DIAGNOSIS — E039 Hypothyroidism, unspecified: Secondary | ICD-10-CM | POA: Diagnosis not present

## 2024-04-13 DIAGNOSIS — E1142 Type 2 diabetes mellitus with diabetic polyneuropathy: Secondary | ICD-10-CM | POA: Diagnosis not present

## 2024-04-13 DIAGNOSIS — F325 Major depressive disorder, single episode, in full remission: Secondary | ICD-10-CM | POA: Diagnosis not present

## 2024-04-14 DIAGNOSIS — E119 Type 2 diabetes mellitus without complications: Secondary | ICD-10-CM | POA: Diagnosis not present

## 2024-04-19 ENCOUNTER — Other Ambulatory Visit: Payer: Self-pay | Admitting: Family Medicine

## 2024-04-19 DIAGNOSIS — I10 Essential (primary) hypertension: Secondary | ICD-10-CM

## 2024-05-02 ENCOUNTER — Encounter: Admitting: Physical Medicine & Rehabilitation

## 2024-05-17 ENCOUNTER — Other Ambulatory Visit: Payer: Self-pay | Admitting: Physical Medicine and Rehabilitation

## 2024-05-18 ENCOUNTER — Other Ambulatory Visit: Payer: Self-pay | Admitting: Family Medicine

## 2024-05-18 DIAGNOSIS — F339 Major depressive disorder, recurrent, unspecified: Secondary | ICD-10-CM

## 2024-05-19 ENCOUNTER — Other Ambulatory Visit: Payer: Self-pay | Admitting: Orthopedic Surgery

## 2024-05-19 DIAGNOSIS — M65332 Trigger finger, left middle finger: Secondary | ICD-10-CM | POA: Diagnosis not present

## 2024-06-19 NOTE — Progress Notes (Signed)
" °  PROCEDURE RECORD [facility]   Name: Candice Warner DOB:1950-08-12 MRN: [id number]  Date:06/19/2024  Physician:Andrew Kirsteins MD     Nurse/CMA: Tye Kitty  Allergies: Allergies[1]  Consent Signed: Yes.    Is patient diabetic? Yes.    CBG today? 137  Pregnant: No. LMP: No LMP recorded. Patient is postmenopausal. (age 75-55)  Anticoagulants: no Anti-inflammatory: no Antibiotics: no  Procedure: Epidural Steroid Injection   Position: Prone Start Time: 1:26 PM  End Time: 1:40 PM  Fluoro Time: 49  RN/CMA Dominika Losey MA Arika Mainer MA    Time 1:07 PM 145 PM    BP 152/61 151/68    Pulse 58 56    Respirations 16 16    O2 Sat 98 98    S/S 6 6    Pain Level 7/10 0/10     D/C home with Shannon Ada, patient A & O X 3, D/C instructions reviewed, and sits independently.           [1] No Known Allergies  "

## 2024-06-20 ENCOUNTER — Encounter: Attending: Physical Medicine and Rehabilitation | Admitting: Physical Medicine & Rehabilitation

## 2024-06-20 ENCOUNTER — Encounter: Payer: Self-pay | Admitting: Physical Medicine & Rehabilitation

## 2024-06-20 VITALS — BP 158/61 | HR 58 | Temp 98.3°F | Ht 66.0 in

## 2024-06-20 DIAGNOSIS — M5416 Radiculopathy, lumbar region: Secondary | ICD-10-CM | POA: Diagnosis not present

## 2024-06-20 MED ORDER — IOHEXOL 180 MG/ML  SOLN
3.0000 mL | Freq: Once | INTRAMUSCULAR | Status: AC
  Administered 2024-06-20: 3 mL via EPIDURAL

## 2024-06-20 MED ORDER — DEXAMETHASONE SOD PHOSPHATE PF 10 MG/ML IJ SOLN
20.0000 mg | Freq: Once | INTRAMUSCULAR | Status: AC
  Administered 2024-06-20: 20 mg

## 2024-06-20 MED ORDER — LIDOCAINE HCL (PF) 1 % IJ SOLN
5.0000 mL | Freq: Once | INTRAMUSCULAR | Status: AC
  Administered 2024-06-20: 5 mL

## 2024-06-20 MED ORDER — LIDOCAINE HCL 1 % IJ SOLN
10.0000 mL | Freq: Once | INTRAMUSCULAR | Status: AC
  Administered 2024-06-20: 10 mL

## 2024-06-20 NOTE — Progress Notes (Signed)
 Bilateral L3-4 Lumbar transforaminal epidural steroid injection under fluoroscopic guidance with contrast enhancement  Indication: Lumbosacral radiculitis is not relieved by medication management or other conservative care and interfering with self-care and mobility.   Informed consent was obtained after describing risk and benefits of the procedure with the patient, this includes bleeding, bruising, infection, paralysis and medication side effects.  The patient wishes to proceed and has given written consent.  Patient was placed in prone position.  The lumbar area was marked and prepped with Betadine.  It was entered with a 25-gauge 1-1/2 inch needle and one mL of 1% lidocaine  was injected into the skin and subcutaneous tissue.  Then a 22-gauge 5 spinal needle was inserted into the Left  L3-4  intervertebral foramen under AP, lateral, and oblique view. .  Once needle tip was within the foramen on lateral views an dnor exceeding 6 o clock position on pedicle on AP view omnipaque  180 was injected x 1.25ml . Then a solution containing one mL of 10 mg per mL dexamethasone  and 2 mL of 1% MPF lidocaine  was injected.  The patient tolerated procedure well. The same procedure was performed on the right side with the same needle injectate but using safe triangle  technique and 22g 3.5 spinal needle.   Post procedure instructions were given.  Please see post procedure form.

## 2024-06-20 NOTE — Patient Instructions (Signed)

## 2024-06-29 ENCOUNTER — Encounter: Payer: Self-pay | Admitting: Family Medicine

## 2024-06-29 ENCOUNTER — Other Ambulatory Visit: Payer: Self-pay | Admitting: Family Medicine

## 2024-06-29 DIAGNOSIS — Z1231 Encounter for screening mammogram for malignant neoplasm of breast: Secondary | ICD-10-CM

## 2024-06-30 ENCOUNTER — Other Ambulatory Visit: Payer: Self-pay

## 2024-06-30 ENCOUNTER — Encounter (HOSPITAL_BASED_OUTPATIENT_CLINIC_OR_DEPARTMENT_OTHER): Payer: Self-pay | Admitting: Orthopedic Surgery

## 2024-07-05 ENCOUNTER — Encounter (HOSPITAL_BASED_OUTPATIENT_CLINIC_OR_DEPARTMENT_OTHER)
Admission: RE | Admit: 2024-07-05 | Discharge: 2024-07-05 | Disposition: A | Discharge status: Home / Self Care | Source: Ambulatory Visit | Attending: Orthopedic Surgery | Admitting: Orthopedic Surgery

## 2024-07-05 DIAGNOSIS — Z01818 Encounter for other preprocedural examination: Secondary | ICD-10-CM | POA: Insufficient documentation

## 2024-07-05 DIAGNOSIS — R001 Bradycardia, unspecified: Secondary | ICD-10-CM | POA: Insufficient documentation

## 2024-07-05 LAB — BASIC METABOLIC PANEL WITH GFR
Anion gap: 11 (ref 5–15)
BUN: 19 mg/dL (ref 8–23)
CO2: 25 mmol/L (ref 22–32)
Calcium: 8.8 mg/dL — ABNORMAL LOW (ref 8.9–10.3)
Chloride: 108 mmol/L (ref 98–111)
Creatinine, Ser: 0.97 mg/dL (ref 0.44–1.00)
GFR, Estimated: >60 mL/min
Glucose, Bld: 137 mg/dL — ABNORMAL HIGH (ref 70–99)
Potassium: 4.5 mmol/L (ref 3.5–5.1)
Sodium: 144 mmol/L (ref 135–145)

## 2024-07-05 NOTE — Progress Notes (Signed)

## 2024-07-06 NOTE — Anesthesia Preprocedure Evaluation (Addendum)
"                                    Anesthesia Evaluation  Patient identified by MRN, date of birth, ID band Patient awake    Reviewed: Allergy & Precautions, H&P , NPO status , Patient's Chart, lab work & pertinent test results  Airway Mallampati: II   Neck ROM: full    Dental   Pulmonary neg pulmonary ROS   breath sounds clear to auscultation       Cardiovascular hypertension, Pt. on medications  Rhythm:regular Rate:Normal     Neuro/Psych  PSYCHIATRIC DISORDERS  Depression    negative neurological ROS     GI/Hepatic negative GI ROS, Neg liver ROS,,,  Endo/Other  diabetes, Well Controlled, Type 2, Oral Hypoglycemic AgentsHypothyroidism    Renal/GU negative Renal ROS  negative genitourinary   Musculoskeletal  (+) Arthritis , Osteoarthritis,    Abdominal   Peds  Hematology negative hematology ROS (+)   Anesthesia Other Findings   Reproductive/Obstetrics negative OB ROS                              Anesthesia Physical Anesthesia Plan  ASA: 3  Anesthesia Plan: General   Post-op Pain Management: Tylenol  PO (pre-op)*   Induction: Intravenous  PONV Risk Score and Plan: 3 and Ondansetron , Dexamethasone  and Treatment may vary due to age or medical condition  Airway Management Planned: LMA  Additional Equipment: None  Intra-op Plan:   Post-operative Plan: Extubation in OR  Informed Consent: I have reviewed the patients History and Physical, chart, labs and discussed the procedure including the risks, benefits and alternatives for the proposed anesthesia with the patient or authorized representative who has indicated his/her understanding and acceptance.     Dental advisory given  Plan Discussed with: CRNA, Anesthesiologist and Surgeon  Anesthesia Plan Comments: (2017 trigger finger under LMA, no issues.)         Anesthesia Quick Evaluation  "

## 2024-07-10 DIAGNOSIS — Z01818 Encounter for other preprocedural examination: Secondary | ICD-10-CM

## 2024-07-11 ENCOUNTER — Telehealth: Payer: Self-pay | Admitting: Family Medicine

## 2024-07-11 DIAGNOSIS — M79645 Pain in left finger(s): Secondary | ICD-10-CM

## 2024-07-11 NOTE — Telephone Encounter (Signed)
 Pt informed referral has been placed per her request.

## 2024-07-11 NOTE — Telephone Encounter (Signed)
 Patient called stating that she has surgery scheduled at [facility] on 2/5, but her insurance is requiring a referral. Asking if her doctor could send a referral please.

## 2024-07-16 ENCOUNTER — Other Ambulatory Visit: Payer: Self-pay | Admitting: Family Medicine

## 2024-07-18 ENCOUNTER — Ambulatory Visit

## 2024-07-20 ENCOUNTER — Other Ambulatory Visit: Payer: Self-pay

## 2024-07-20 ENCOUNTER — Encounter (HOSPITAL_BASED_OUTPATIENT_CLINIC_OR_DEPARTMENT_OTHER): Payer: Self-pay | Admitting: Orthopedic Surgery

## 2024-07-20 ENCOUNTER — Encounter (HOSPITAL_BASED_OUTPATIENT_CLINIC_OR_DEPARTMENT_OTHER)
Admission: RE | Disposition: A | Discharge status: Home / Self Care | Payer: Self-pay | Source: Home / Self Care | Attending: Orthopedic Surgery

## 2024-07-20 ENCOUNTER — Ambulatory Visit (HOSPITAL_BASED_OUTPATIENT_CLINIC_OR_DEPARTMENT_OTHER): Payer: Self-pay | Admitting: Anesthesiology

## 2024-07-20 ENCOUNTER — Ambulatory Visit (HOSPITAL_BASED_OUTPATIENT_CLINIC_OR_DEPARTMENT_OTHER)
Admission: RE | Admit: 2024-07-20 | Discharge: 2024-07-20 | Disposition: A | Source: Home / Self Care | Attending: Orthopedic Surgery | Admitting: Orthopedic Surgery

## 2024-07-20 DIAGNOSIS — Z01818 Encounter for other preprocedural examination: Secondary | ICD-10-CM

## 2024-07-20 LAB — GLUCOSE, CAPILLARY
Glucose-Capillary: 130 mg/dL — ABNORMAL HIGH (ref 70–99)
Glucose-Capillary: 136 mg/dL — ABNORMAL HIGH (ref 70–99)

## 2024-07-20 MED ORDER — FENTANYL CITRATE (PF) 100 MCG/2ML IJ SOLN
INTRAMUSCULAR | Status: DC | PRN
  Administered 2024-07-20: 50 ug via INTRAVENOUS

## 2024-07-20 MED ORDER — DEXAMETHASONE SOD PHOSPHATE PF 10 MG/ML IJ SOLN
INTRAMUSCULAR | Status: AC
  Filled 2024-07-20: qty 1

## 2024-07-20 MED ORDER — ACETAMINOPHEN 500 MG PO TABS
ORAL_TABLET | ORAL | Status: AC
  Filled 2024-07-20: qty 2

## 2024-07-20 MED ORDER — ONDANSETRON HCL 4 MG/2ML IJ SOLN
INTRAMUSCULAR | Status: AC
  Filled 2024-07-20: qty 2

## 2024-07-20 MED ORDER — ONDANSETRON HCL 4 MG/2ML IJ SOLN
INTRAMUSCULAR | Status: DC | PRN
  Administered 2024-07-20: 4 mg via INTRAVENOUS

## 2024-07-20 MED ORDER — CEFAZOLIN SODIUM-DEXTROSE 2-4 GM/100ML-% IV SOLN
INTRAVENOUS | Status: AC
  Filled 2024-07-20: qty 100

## 2024-07-20 MED ORDER — LACTATED RINGERS IV SOLN: INTRAVENOUS | Status: DC

## 2024-07-20 MED ORDER — DEXAMETHASONE SOD PHOSPHATE PF 10 MG/ML IJ SOLN
INTRAMUSCULAR | Status: DC | PRN
  Administered 2024-07-20: 10 mg via INTRAVENOUS

## 2024-07-20 MED ORDER — ACETAMINOPHEN 500 MG PO TABS
1000.0000 mg | ORAL_TABLET | Freq: Once | ORAL | Status: AC
  Administered 2024-07-20: 1000 mg via ORAL

## 2024-07-20 MED ORDER — FENTANYL CITRATE (PF) 100 MCG/2ML IJ SOLN
INTRAMUSCULAR | Status: AC
  Filled 2024-07-20: qty 2

## 2024-07-20 MED ORDER — KETOROLAC TROMETHAMINE 30 MG/ML IJ SOLN
INTRAMUSCULAR | Status: AC
  Filled 2024-07-20: qty 1

## 2024-07-20 MED ORDER — OXYCODONE HCL 5 MG PO TABS: 5.0000 mg | ORAL_TABLET | Freq: Once | ORAL | Status: DC | PRN

## 2024-07-20 MED ORDER — EPHEDRINE SULFATE (PRESSORS) 25 MG/5ML IV SOSY
PREFILLED_SYRINGE | INTRAVENOUS | Status: DC | PRN
  Administered 2024-07-20: 10 mg via INTRAVENOUS

## 2024-07-20 MED ORDER — ESMOLOL HCL 100 MG/10ML IV SOLN
INTRAVENOUS | Status: DC | PRN
  Administered 2024-07-20: 20 mg via INTRAVENOUS
  Administered 2024-07-20: 30 mg via INTRAVENOUS

## 2024-07-20 MED ORDER — ONDANSETRON HCL 4 MG/2ML IJ SOLN: 4.0000 mg | Freq: Four times a day (QID) | INTRAMUSCULAR | Status: DC | PRN

## 2024-07-20 MED ORDER — OXYCODONE HCL 5 MG/5ML PO SOLN: 5.0000 mg | Freq: Once | ORAL | Status: DC | PRN

## 2024-07-20 MED ORDER — LIDOCAINE 2% (20 MG/ML) 5 ML SYRINGE
INTRAMUSCULAR | Status: DC | PRN
  Administered 2024-07-20 (×2): 50 mg via INTRAVENOUS

## 2024-07-20 MED ORDER — PROPOFOL 10 MG/ML IV BOLUS
INTRAVENOUS | Status: DC | PRN
  Administered 2024-07-20 (×2): 50 mg via INTRAVENOUS
  Administered 2024-07-20: 150 mg via INTRAVENOUS

## 2024-07-20 MED ORDER — KETOROLAC TROMETHAMINE 30 MG/ML IJ SOLN
INTRAMUSCULAR | Status: DC | PRN
  Administered 2024-07-20: 15 mg via INTRAVENOUS

## 2024-07-20 MED ORDER — FENTANYL CITRATE (PF) 100 MCG/2ML IJ SOLN: 25.0000 ug | INTRAMUSCULAR | Status: DC | PRN

## 2024-07-20 MED ORDER — BUPIVACAINE HCL (PF) 0.25 % IJ SOLN
INTRAMUSCULAR | Status: DC | PRN
  Administered 2024-07-20: 9 mL

## 2024-07-20 MED ORDER — HYDROCODONE-ACETAMINOPHEN 5-325 MG PO TABS: 1.0000 | ORAL_TABLET | Freq: Four times a day (QID) | ORAL | 0 refills | Status: AC | PRN

## 2024-07-20 MED ORDER — CEFAZOLIN SODIUM-DEXTROSE 2-4 GM/100ML-% IV SOLN
2.0000 g | INTRAVENOUS | Status: AC
  Administered 2024-07-20: 2 g via INTRAVENOUS

## 2024-07-20 NOTE — Anesthesia Postprocedure Evaluation (Signed)
"   Anesthesia Post Note  Patient: Candice Warner  Procedure(s) Performed: RELEASE, A1 PULLEY, FOR TRIGGER FINGER (Left: Middle Finger)     Patient location during evaluation: PACU Anesthesia Type: General Level of consciousness: awake and alert Pain management: pain level controlled Vital Signs Assessment: post-procedure vital signs reviewed and stable Respiratory status: spontaneous breathing, nonlabored ventilation, respiratory function stable and patient connected to nasal cannula oxygen Cardiovascular status: blood pressure returned to baseline and stable Postop Assessment: no apparent nausea or vomiting Anesthetic complications: no   No notable events documented.  Last Vitals:  Vitals:   07/20/24 1345 07/20/24 1404  BP: (!) 155/64 (!) 151/80  Pulse: 68 62  Resp: 15 16  Temp:  (!) 36.1 C  SpO2: 93% 95%    Last Pain:  Vitals:   07/20/24 1404  TempSrc: Temporal  PainSc:                  [name]      "

## 2024-07-20 NOTE — Op Note (Signed)
 07/20/2024 [facility]  Operative Note  PREOPERATIVE DIAGNOSIS: LEFT LONG FINGER TRIGGER DIGIT  POSTOPERATIVE DIAGNOSIS:  LEFT LONG FINGER TRIGGER DIGIT  PROCEDURE: Procedures: RELEASE, A1 PULLEY, FOR TRIGGER FINGER   SURGEON:  Franky Curia, MD  ASSISTANT:  none  ANESTHESIA:  General  IV FLUIDS:  Per anesthesia flow sheet  ESTIMATED BLOOD LOSS:  Minimal  COMPLICATIONS:  None  SPECIMENS:  None  TOURNIQUET TIME:  Total Tourniquet Time Documented: Upper Arm (Left) - 9 minutes Total: Upper Arm (Left) - 9 minutes   DISPOSITION:  Stable to PACU  LOCATION:  SURGERY CENTER  INDICATIONS: Candice Warner is a 74 y.o. female with triggering of the long finger.  This has been injected without lasting resolution.  She wishes to proceed with surgical trigger release.  Risks, benefits and alternatives of surgery were discussed including the risk of blood loss, infection, damage to nerves, vessels, tendons, ligaments, bone, failure of surgery, need for additional surgery, complications with wound healing, continued pain, continued triggering and need for repeat surgery.  She voiced understanding of these risks and elected to proceed.  OPERATIVE COURSE:  After being identified preoperatively by myself, the patient and I agreed upon the procedure and site of procedure.  The surgical site was marked. Surgical consent had been signed. She was given IV Ancef  as preoperative antibiotic prophylaxis. She was transported to the operating room and placed on the operating room table in supine position with the left upper extremity on an arm board. General anesthesia was induced by the anesthesiologist.  The left upper extremity was prepped and draped in normal sterile orthopedic fashion. A surgical pause was performed between surgeons, anesthesia, and operating room staff, and all were in agreement as to the patient, procedure, and site of procedure.  Tourniquet at the proximal  aspect of the extremity was inflated to 250 mmHg after exsanguination of the arm with an Esmarch bandage.  An incision was made at the volar aspect of the MP joint of the long finger.  This was carried into the subcutaneous tissues by spreading technique.  Bipolar electrocautery was used to obtain hemostasis.  The radial and ulnar digital nerves were protected throughout the case. The flexor sheath was identified.  The A1 pulley was identified and sharply incised.  It was released in its entirety.  The proximal 1-2 mm of the A2 pulley was vented to allow better excursion of the tendons.  The finger was placed through a range of motion and there was noted to be no catching.  The tendons were brought through the wound and any adherences released.  The wound was then copiously irrigated with sterile saline. It was closed with 4-0 nylon in a horizontal mattress fashion.  It was injected with 0.25% plain Marcaine  to aid in postoperative analgesia.  It was dressed with sterile Xeroform, 4x4s, and wrapped lightly with a Coban dressing.  Tourniquet was deflated at 9 minutes.  The fingertips were pink with brisk capillary refill after deflation of the tourniquet.  The operative drapes were broken down and the patient was awoken from anesthesia safely.  She was transferred back to the stretcher and taken to the PACU in stable condition.   I will see her back in the office in 1 week for postoperative followup.  I will give her a prescription for Norco 5/325 1 tab PO q6 hours prn pain, dispense #15.    Candice Cedeno, MD Electronically signed, 07/20/24

## 2024-07-20 NOTE — Anesthesia Procedure Notes (Signed)
 Procedure Name: Intubation Date/Time: 07/20/2024 12:33 PM  Performed by: Lenard Lacks, CRNAPre-anesthesia Checklist: Patient identified, Emergency Drugs available, Suction available and Patient being monitored Patient Re-evaluated:Patient Re-evaluated prior to induction Oxygen Delivery Method: Circle system utilized Preoxygenation: Pre-oxygenation with 100% oxygen Induction Type: IV induction Ventilation: Mask ventilation without difficulty and Oral airway inserted - appropriate to patient size Laryngoscope Size: 2 and Miller Grade View: Grade I Tube type: Oral Tube size: 7.0 mm Number of attempts: 1 Airway Equipment and Method: Stylet Placement Confirmation: ETT inserted through vocal cords under direct vision, positive ETCO2 and breath sounds checked- equal and bilateral Tube secured with: Tape Dental Injury: Teeth and Oropharynx as per pre-operative assessment  Comments: Mulltiple attempts to place LMA but unable to venitilate.  ETT placed easily

## 2024-07-20 NOTE — Transfer of Care (Signed)
 Immediate Anesthesia Transfer of Care Note  Patient: Candice Warner  Procedure(s) Performed: RELEASE, A1 PULLEY, FOR TRIGGER FINGER (Left: Middle Finger)  Patient Location: PACU  Anesthesia Type:General  Level of Consciousness: drowsy  Airway & Oxygen Therapy: Patient Spontanous Breathing and Patient connected to face mask oxygen  Post-op Assessment: Report given to RN and Post -op Vital signs reviewed and stable  Post vital signs: Reviewed and stable  Last Vitals:  Vitals Value Taken Time  BP 171/75 07/20/24 13:09  Temp    Pulse 84 07/20/24 13:11  Resp 22 07/20/24 13:11  SpO2 97 % 07/20/24 13:11  Vitals shown include unfiled device data.  Last Pain:  Vitals:   07/20/24 0957  TempSrc: Temporal  PainSc: 8       Patients Stated Pain Goal: 3 (07/20/24 0957)  Complications: No notable events documented.

## 2024-07-20 NOTE — H&P (Signed)
 Candice Warner is an 74 y.o. female.   Chief Complaint: trigger digit HPI: 74 y.o. yo female with triggering of left long finger.  This has been injected without lasting resolution.  She wishes to proceed with surgical trigger release.   Allergies: Allergies[1]  Past Medical History:  Diagnosis Date   Arthritis    right hip (06/27/2018)   Depression    Hyperlipidemia    Hypertension    Hypokalemia    Hypothyroidism    Lumbar back pain    Obesity    Radiculopathy    Thyroid  disease    Type II diabetes mellitus (HCC)     Past Surgical History:  Procedure Laterality Date   CYST REMOVAL HAND Right 11/07/2015   Procedure: EXCISION OF VOLAR RETANICULUM RIGHT HAND;  Surgeon: Toribio Candice Chancy, MD;  Location: [facility];  Service: Orthopedics;  Laterality: Right;   SHOULDER ARTHROSCOPY Left 2002   SHOULDER ARTHROSCOPY WITH ROTATOR CUFF REPAIR AND SUBACROMIAL DECOMPRESSION  07/14/2012   Procedure: SHOULDER ARTHROSCOPY WITH ROTATOR CUFF REPAIR AND SUBACROMIAL DECOMPRESSION;  Surgeon: Toribio Candice Chancy, MD;  Location: [facility];  Service: Orthopedics;  Laterality: Right;  Right Shoulder Arthroscopy, Distal Claviculectomy, Subacromial Decompression, Partial Acromioplasty with Coracoacromial Release with Arthroscopic Rotator Cuff Repair    THYROID  LOBECTOMY Right    Goiter s/p right thyroidectomy   TRIGGER FINGER RELEASE Right 11/07/2015   Procedure: RELEASE TRIGGER FINGER/A-1 PULLEY RIGHT RING FINGER ;  Surgeon: Toribio Candice Chancy, MD;  Location: [facility];  Service: Orthopedics;  Laterality: Right;   TUBAL LIGATION      Family History: Family History  Problem Relation Age of Onset   Heart disease Mother    Heart disease Father    Breast cancer Maternal Aunt     Social History:   reports that she has never smoked. She has never been exposed to tobacco smoke. She has never used smokeless tobacco. She reports that she does not currently use  alcohol. She reports that she does not use drugs.  Medications: Medications Prior to Admission  Medication Sig Dispense Refill   amLODipine  (NORVASC ) 10 MG tablet TAKE 1 TABLET BY MOUTH EVERYDAY AT BEDTIME 90 tablet 3   aspirin  EC 81 MG tablet Take 1 tablet by mouth daily.     atorvastatin  (LIPITOR) 40 MG tablet TAKE 1 TABLET BY MOUTH EVERY DAY 90 tablet 3   gabapentin  (NEURONTIN ) 300 MG capsule Take 1 capsule (300 mg total) by mouth in the morning AND 2 capsules (600 mg total) at bedtime. 90 capsule 5   levothyroxine  (SYNTHROID ) 50 MCG tablet TAKE 1 TABLET BY MOUTH EVERY DAY 90 tablet 2   loratadine  (CLARITIN ) 10 MG tablet TAKE 1 TABLET BY MOUTH EVERY DAY 90 tablet 1   Magnesium  250 MG CAPS Take 1 tablet by mouth at bedtime.     metFORMIN  (GLUCOPHAGE -XR) 500 MG 24 hr tablet TAKE 2 TABLETS BY MOUTH 2 TIMES DAILY WITH A MEAL. NEEDS APPOINTMENT PRIOR TO NEXT REFILL 360 tablet 1   sertraline  (ZOLOFT ) 100 MG tablet TAKE 1/2 TABLET BY MOUTH DAILY 45 tablet 1   Accu-Chek Softclix Lancets lancets Use to check blood sugar 3 times daily 100 each 3   Blood Glucose Monitoring Suppl (ACCU-CHEK GUIDE) w/Device KIT Check blood sugar once daily 1 kit 0   glucose 4 GM chewable tablet Chew 1 tablet (4 g total) by mouth as needed for low blood sugar. 50 tablet 12   Lancets Misc. (ACCU-CHEK  SOFTCLIX LANCET DEV) KIT Use to check blood sugar 3 times daily 1 kit 0   lisinopril  (ZESTRIL ) 20 MG tablet TAKE 1 TABLET BY MOUTH EVERY DAY 90 tablet 1   ONETOUCH VERIO test strip USE AS INSTRUCTED 100 strip 12    Results for orders placed or performed during the hospital encounter of 07/20/24 (from the past 48 hours)  Glucose, capillary     Status: Abnormal   Collection Time: 07/20/24 10:05 AM  Result Value Ref Range   Glucose-Capillary 130 (H) 70 - 99 mg/dL    Comment: Glucose reference range applies only to samples taken after fasting for at least 8 hours.    No results found.    Blood pressure (!) 161/60,  pulse (!) 55, temperature 97.9 F (36.6 C), temperature source Temporal, height 5' 6 (1.676 m), weight 84.1 kg, SpO2 100%.  General appearance: alert, cooperative, and appears stated age Head: Normocephalic, without obvious abnormality, atraumatic Neck: supple, symmetrical, trachea midline Extremities: Intact sensation and capillary refill all digits.  +epl/fpl/io.  No wounds.  Skin: Skin color, texture, turgor normal. No rashes or lesions Neurologic: Grossly normal Incision/Wound: none  Assessment/Plan Left long finger trigger digit.  Non operative and operative treatment options have been discussed with the patient and patient wishes to proceed with operative treatment. Risks, benefits and alternatives of surgery were discussed including risks of blood loss, infection, damage to nerves/vessels/tendons/ligament/bone, failure of surgery, need for additional surgery, complication with wound healing, stiffness, recurrence.  She voiced understanding of these risks and elected to proceed.    [name] 07/20/2024, 10:14 AM      [1] No Known Allergies

## 2024-07-20 NOTE — Discharge Instructions (Addendum)
 [facility] Center Instructions Hand Surgery  Wound Care: Keep your hand elevated above the level of your heart.  Do not allow it to dangle by your side.  Keep the dressing dry and do not remove it unless your doctor advises you to do so.  He will usually change it at the time of your post-op visit.  Moving your fingers is advised to stimulate circulation but will depend on the site of your surgery.  If you have a splint applied, your doctor will advise you regarding movement.  Activity: Do not drive or operate machinery today.  Rest today and then you may return to your normal activity and work as indicated by your physician.  Diet:  Drink liquids today or eat a light diet.  You may resume a regular diet tomorrow.    General expectations: Pain for two to three days. Fingers may become slightly swollen.  Call your doctor if any of the following occur: Severe pain not relieved by pain medication. Elevated temperature. Dressing soaked with blood. Inability to move fingers. White or bluish color to fingers.    Post Anesthesia Home Care Instructions  Activity: Get plenty of rest for the remainder of the day. A responsible individual must stay with you for 24 hours following the procedure.  For the next 24 hours, DO NOT: -Drive a car -Advertising copywriter -Drink alcoholic beverages -Take any medication unless instructed by your physician -Make any legal decisions or sign important papers.  Meals: Start with liquid foods such as gelatin or soup. Progress to regular foods as tolerated. Avoid greasy, spicy, heavy foods. If nausea and/or vomiting occur, drink only clear liquids until the nausea and/or vomiting subsides. Call your physician if vomiting continues.  Special Instructions/Symptoms: Your throat may feel dry or sore from the anesthesia or the breathing tube placed in your throat during surgery. If this causes discomfort, gargle with warm salt water. The discomfort should disappear  within 24 hours.  If you had a scopolamine  patch placed behind your ear for the management of post- operative nausea and/or vomiting:  1. The medication in the patch is effective for 72 hours, after which it should be removed.  Wrap patch in a tissue and discard in the trash. Wash hands thoroughly with soap and water. 2. You may remove the patch earlier than 72 hours if you experience unpleasant side effects which may include dry mouth, dizziness or visual disturbances. 3. Avoid touching the patch. Wash your hands with soap and water after contact with the patch.    No Tylenol  until after 4pm today if needed No NSAIDs until after 6:53pm today if needed

## 2024-07-21 ENCOUNTER — Encounter (HOSPITAL_BASED_OUTPATIENT_CLINIC_OR_DEPARTMENT_OTHER): Payer: Self-pay | Admitting: Orthopedic Surgery

## 2024-07-21 ENCOUNTER — Other Ambulatory Visit: Payer: Self-pay

## 2024-07-21 DIAGNOSIS — I10 Essential (primary) hypertension: Secondary | ICD-10-CM

## 2024-07-21 MED ORDER — LISINOPRIL 20 MG PO TABS: 20.0000 mg | ORAL_TABLET | Freq: Every day | ORAL | 1 refills | Status: AC

## 2024-07-21 MED ORDER — METFORMIN HCL ER 500 MG PO TB24: 500.0000 mg | ORAL_TABLET | Freq: Two times a day (BID) | ORAL | 1 refills | Status: AC

## 2024-07-21 MED ORDER — AMLODIPINE BESYLATE 10 MG PO TABS: 10.0000 mg | ORAL_TABLET | Freq: Every day | ORAL | 3 refills | Status: AC

## 2024-07-21 NOTE — Telephone Encounter (Signed)
 Received call from [facility] regarding refill requests.   Called patient and verified pharmacy change.   Called [facility] and cancelled existing refills for amlodipine , lisinopril  and metformin .   Forwarding refill requests to PCP.   Chiquita JAYSON English, RN

## 2024-09-14 ENCOUNTER — Encounter

## 2024-09-27 ENCOUNTER — Encounter: Admitting: Physical Medicine and Rehabilitation
